# Patient Record
Sex: Female | Born: 1960 | Race: White | Hispanic: No | Smoking: Never smoker
Health system: Southern US, Community
[De-identification: ages and names within clinical notes are randomized; demographics above are authoritative.]

## PROBLEM LIST (undated history)

## (undated) DIAGNOSIS — M779 Enthesopathy, unspecified: Secondary | ICD-10-CM

## (undated) DIAGNOSIS — I341 Nonrheumatic mitral (valve) prolapse: Secondary | ICD-10-CM

## (undated) DIAGNOSIS — G35 Multiple sclerosis: Secondary | ICD-10-CM

## (undated) DIAGNOSIS — I499 Cardiac arrhythmia, unspecified: Secondary | ICD-10-CM

## (undated) DIAGNOSIS — R32 Unspecified urinary incontinence: Secondary | ICD-10-CM

## (undated) DIAGNOSIS — M1990 Unspecified osteoarthritis, unspecified site: Secondary | ICD-10-CM

## (undated) DIAGNOSIS — R51 Headache: Secondary | ICD-10-CM

## (undated) DIAGNOSIS — F419 Anxiety disorder, unspecified: Secondary | ICD-10-CM

## (undated) DIAGNOSIS — K589 Irritable bowel syndrome without diarrhea: Secondary | ICD-10-CM

## (undated) DIAGNOSIS — R5382 Chronic fatigue, unspecified: Secondary | ICD-10-CM

## (undated) DIAGNOSIS — F988 Other specified behavioral and emotional disorders with onset usually occurring in childhood and adolescence: Secondary | ICD-10-CM

## (undated) DIAGNOSIS — H539 Unspecified visual disturbance: Secondary | ICD-10-CM

## (undated) DIAGNOSIS — G259 Extrapyramidal and movement disorder, unspecified: Secondary | ICD-10-CM

## (undated) DIAGNOSIS — R29898 Other symptoms and signs involving the musculoskeletal system: Secondary | ICD-10-CM

## (undated) DIAGNOSIS — G629 Polyneuropathy, unspecified: Secondary | ICD-10-CM

## (undated) DIAGNOSIS — Z87820 Personal history of traumatic brain injury: Secondary | ICD-10-CM

## (undated) HISTORY — PX: TONSILLECTOMY: SUR1361

## (undated) HISTORY — PX: OTHER SURGICAL HISTORY: SHX169

## (undated) HISTORY — PX: LAPAROSCOPIC ASSISTED VAGINAL HYSTERECTOMY: SHX5398

## (undated) HISTORY — DX: Unspecified visual disturbance: H53.9

## (undated) HISTORY — DX: Unspecified osteoarthritis, unspecified site: M19.90

## (undated) HISTORY — DX: Enthesopathy, unspecified: M77.9

## (undated) HISTORY — PX: HERNIA REPAIR: SHX51

## (undated) HISTORY — DX: Extrapyramidal and movement disorder, unspecified: G25.9

---

## 1999-08-09 ENCOUNTER — Inpatient Hospital Stay (HOSPITAL_COMMUNITY): Admission: AD | Admit: 1999-08-09 | Discharge: 1999-08-09 | Payer: Self-pay | Admitting: Obstetrics and Gynecology

## 1999-09-29 ENCOUNTER — Other Ambulatory Visit: Admission: RE | Admit: 1999-09-29 | Discharge: 1999-09-29 | Payer: Self-pay | Admitting: Obstetrics and Gynecology

## 2000-06-22 ENCOUNTER — Encounter: Payer: Self-pay | Admitting: Internal Medicine

## 2000-06-22 ENCOUNTER — Encounter: Admission: RE | Admit: 2000-06-22 | Discharge: 2000-06-22 | Payer: Self-pay | Admitting: Internal Medicine

## 2000-10-02 ENCOUNTER — Other Ambulatory Visit: Admission: RE | Admit: 2000-10-02 | Discharge: 2000-10-02 | Payer: Self-pay | Admitting: Obstetrics and Gynecology

## 2001-10-09 ENCOUNTER — Other Ambulatory Visit: Admission: RE | Admit: 2001-10-09 | Discharge: 2001-10-09 | Payer: Self-pay | Admitting: Obstetrics and Gynecology

## 2001-10-12 ENCOUNTER — Encounter: Admission: RE | Admit: 2001-10-12 | Discharge: 2001-10-12 | Payer: Self-pay | Admitting: Internal Medicine

## 2001-10-12 ENCOUNTER — Encounter: Payer: Self-pay | Admitting: Internal Medicine

## 2002-01-15 ENCOUNTER — Encounter (INDEPENDENT_AMBULATORY_CARE_PROVIDER_SITE_OTHER): Payer: Self-pay | Admitting: Specialist

## 2002-01-16 ENCOUNTER — Inpatient Hospital Stay (HOSPITAL_COMMUNITY): Admission: RE | Admit: 2002-01-16 | Discharge: 2002-01-17 | Payer: Self-pay | Admitting: Obstetrics and Gynecology

## 2002-07-22 ENCOUNTER — Encounter: Admission: RE | Admit: 2002-07-22 | Discharge: 2002-07-22 | Payer: Self-pay | Admitting: Obstetrics and Gynecology

## 2002-07-22 ENCOUNTER — Encounter: Payer: Self-pay | Admitting: Obstetrics and Gynecology

## 2002-11-05 ENCOUNTER — Encounter: Admission: RE | Admit: 2002-11-05 | Discharge: 2002-11-05 | Payer: Self-pay | Admitting: Family Medicine

## 2003-07-16 ENCOUNTER — Other Ambulatory Visit: Admission: RE | Admit: 2003-07-16 | Discharge: 2003-07-16 | Payer: Self-pay | Admitting: Obstetrics and Gynecology

## 2003-09-19 ENCOUNTER — Encounter: Admission: RE | Admit: 2003-09-19 | Discharge: 2003-12-18 | Payer: Self-pay | Admitting: Internal Medicine

## 2003-09-23 ENCOUNTER — Encounter: Admission: RE | Admit: 2003-09-23 | Discharge: 2003-09-23 | Payer: Self-pay | Admitting: Internal Medicine

## 2003-09-23 ENCOUNTER — Encounter: Payer: Self-pay | Admitting: Internal Medicine

## 2003-11-14 ENCOUNTER — Encounter: Admission: RE | Admit: 2003-11-14 | Discharge: 2003-11-14 | Payer: Self-pay | Admitting: Internal Medicine

## 2004-07-26 ENCOUNTER — Other Ambulatory Visit: Admission: RE | Admit: 2004-07-26 | Discharge: 2004-07-26 | Payer: Self-pay | Admitting: Obstetrics and Gynecology

## 2005-07-07 ENCOUNTER — Ambulatory Visit (HOSPITAL_COMMUNITY): Admission: RE | Admit: 2005-07-07 | Discharge: 2005-07-07 | Payer: Self-pay | Admitting: General Surgery

## 2005-07-07 ENCOUNTER — Encounter (INDEPENDENT_AMBULATORY_CARE_PROVIDER_SITE_OTHER): Payer: Self-pay | Admitting: Specialist

## 2005-08-19 ENCOUNTER — Other Ambulatory Visit: Admission: RE | Admit: 2005-08-19 | Discharge: 2005-08-19 | Payer: Self-pay | Admitting: Obstetrics and Gynecology

## 2006-02-24 ENCOUNTER — Encounter: Admission: RE | Admit: 2006-02-24 | Discharge: 2006-02-24 | Payer: Self-pay | Admitting: Internal Medicine

## 2006-04-07 ENCOUNTER — Encounter: Admission: RE | Admit: 2006-04-07 | Discharge: 2006-04-07 | Payer: Self-pay | Admitting: Pediatrics

## 2006-12-27 ENCOUNTER — Encounter: Admission: RE | Admit: 2006-12-27 | Discharge: 2006-12-27 | Payer: Self-pay | Admitting: Specialist

## 2007-01-01 ENCOUNTER — Encounter: Admission: RE | Admit: 2007-01-01 | Discharge: 2007-01-01 | Payer: Self-pay | Admitting: Specialist

## 2007-03-08 ENCOUNTER — Encounter: Admission: RE | Admit: 2007-03-08 | Discharge: 2007-03-08 | Payer: Self-pay | Admitting: Gastroenterology

## 2007-04-17 ENCOUNTER — Emergency Department (HOSPITAL_COMMUNITY): Admission: EM | Admit: 2007-04-17 | Discharge: 2007-04-17 | Payer: Self-pay | Admitting: *Deleted

## 2007-08-19 ENCOUNTER — Ambulatory Visit (HOSPITAL_COMMUNITY): Admission: RE | Admit: 2007-08-19 | Discharge: 2007-08-19 | Payer: Self-pay | Admitting: Internal Medicine

## 2007-09-15 ENCOUNTER — Emergency Department (HOSPITAL_COMMUNITY): Admission: EM | Admit: 2007-09-15 | Discharge: 2007-09-15 | Payer: Self-pay | Admitting: Emergency Medicine

## 2008-04-18 ENCOUNTER — Encounter: Admission: RE | Admit: 2008-04-18 | Discharge: 2008-04-18 | Payer: Self-pay | Admitting: Gastroenterology

## 2008-10-04 ENCOUNTER — Inpatient Hospital Stay (HOSPITAL_COMMUNITY): Admission: EM | Admit: 2008-10-04 | Discharge: 2008-10-09 | Payer: Self-pay | Admitting: Emergency Medicine

## 2008-10-07 ENCOUNTER — Encounter (INDEPENDENT_AMBULATORY_CARE_PROVIDER_SITE_OTHER): Payer: Self-pay | Admitting: Internal Medicine

## 2009-05-19 ENCOUNTER — Encounter: Admission: RE | Admit: 2009-05-19 | Discharge: 2009-05-19 | Payer: Self-pay | Admitting: Gastroenterology

## 2010-03-10 ENCOUNTER — Inpatient Hospital Stay (HOSPITAL_COMMUNITY): Admission: EM | Admit: 2010-03-10 | Discharge: 2010-03-18 | Payer: Self-pay | Admitting: Emergency Medicine

## 2010-06-24 ENCOUNTER — Ambulatory Visit (HOSPITAL_COMMUNITY): Admission: RE | Admit: 2010-06-24 | Discharge: 2010-06-24 | Payer: Self-pay | Admitting: Obstetrics and Gynecology

## 2010-09-07 ENCOUNTER — Ambulatory Visit: Payer: Self-pay | Admitting: Internal Medicine

## 2010-09-14 ENCOUNTER — Ambulatory Visit: Payer: Self-pay | Admitting: Internal Medicine

## 2010-10-07 ENCOUNTER — Ambulatory Visit: Payer: Self-pay | Admitting: Internal Medicine

## 2010-10-07 ENCOUNTER — Encounter: Admission: RE | Admit: 2010-10-07 | Discharge: 2010-10-07 | Payer: Self-pay | Admitting: Internal Medicine

## 2011-02-10 ENCOUNTER — Ambulatory Visit (INDEPENDENT_AMBULATORY_CARE_PROVIDER_SITE_OTHER): Payer: 59 | Admitting: Internal Medicine

## 2011-02-10 DIAGNOSIS — G35 Multiple sclerosis: Secondary | ICD-10-CM

## 2011-02-10 DIAGNOSIS — R5383 Other fatigue: Secondary | ICD-10-CM

## 2011-02-10 DIAGNOSIS — R5381 Other malaise: Secondary | ICD-10-CM

## 2011-02-23 LAB — BASIC METABOLIC PANEL
BUN: 12 mg/dL (ref 6–23)
BUN: 8 mg/dL (ref 6–23)
CO2: 29 mEq/L (ref 19–32)
CO2: 33 mEq/L — ABNORMAL HIGH (ref 19–32)
Calcium: 8.1 mg/dL — ABNORMAL LOW (ref 8.4–10.5)
Calcium: 8.8 mg/dL (ref 8.4–10.5)
Chloride: 100 mEq/L (ref 96–112)
Chloride: 101 mEq/L (ref 96–112)
Creatinine, Ser: 0.78 mg/dL (ref 0.4–1.2)
Creatinine, Ser: 0.9 mg/dL (ref 0.4–1.2)
GFR calc Af Amer: >60 mL/min (ref >60)
GFR calc Af Amer: >60 mL/min (ref >60)
GFR calc non Af Amer: >60 mL/min (ref >60)
GFR calc non Af Amer: >60 mL/min (ref >60)
Glucose, Bld: 102 mg/dL — ABNORMAL HIGH (ref 70–99)
Glucose, Bld: 112 mg/dL — ABNORMAL HIGH (ref 70–99)
Potassium: 3.5 mEq/L (ref 3.5–5.1)
Potassium: 3.9 mEq/L (ref 3.5–5.1)
Sodium: 139 mEq/L (ref 135–145)
Sodium: 140 mEq/L (ref 135–145)

## 2011-02-23 LAB — CARDIAC PANEL(CRET KIN+CKTOT+MB+TROPI)
CK, MB: 1.8 ng/mL (ref 0.3–4.0)
CK, MB: 2.3 ng/mL (ref 0.3–4.0)
CK, MB: 2.7 ng/mL (ref 0.3–4.0)
CK, MB: 3.1 ng/mL (ref 0.3–4.0)
Relative Index: 3.1 — ABNORMAL HIGH (ref 0.0–2.5)
Relative Index: INVALID (ref 0.0–2.5)
Relative Index: INVALID (ref 0.0–2.5)
Relative Index: INVALID (ref 0.0–2.5)
Total CK: 100 U/L (ref 7–177)
Total CK: 74 U/L (ref 7–177)
Total CK: 88 U/L (ref 7–177)
Total CK: 97 U/L (ref 7–177)
Troponin I: 0.01 ng/mL (ref 0.00–0.06)
Troponin I: 0.01 ng/mL (ref 0.00–0.06)
Troponin I: 0.03 ng/mL (ref 0.00–0.06)
Troponin I: <0.01 ng/mL (ref 0.00–0.06)

## 2011-02-23 LAB — URINALYSIS, ROUTINE W REFLEX MICROSCOPIC
Bilirubin Urine: NEGATIVE
Glucose, UA: NEGATIVE mg/dL
Hgb urine dipstick: NEGATIVE
Ketones, ur: NEGATIVE mg/dL
Nitrite: NEGATIVE
Protein, ur: NEGATIVE mg/dL
Specific Gravity, Urine: 1.008 (ref 1.005–1.030)
Urobilinogen, UA: 0.2 mg/dL (ref 0.0–1.0)
pH: 6 (ref 5.0–8.0)

## 2011-02-23 LAB — CBC
HCT: 33.3 % — ABNORMAL LOW (ref 36.0–46.0)
HCT: 34.3 % — ABNORMAL LOW (ref 36.0–46.0)
HCT: 35.1 % — ABNORMAL LOW (ref 36.0–46.0)
HCT: 35.6 % — ABNORMAL LOW (ref 36.0–46.0)
HCT: 39.9 % (ref 36.0–46.0)
HCT: 41.2 % (ref 36.0–46.0)
HCT: 41.2 % (ref 36.0–46.0)
Hemoglobin: 11.5 g/dL — ABNORMAL LOW (ref 12.0–15.0)
Hemoglobin: 11.6 g/dL — ABNORMAL LOW (ref 12.0–15.0)
Hemoglobin: 11.9 g/dL — ABNORMAL LOW (ref 12.0–15.0)
Hemoglobin: 12.1 g/dL (ref 12.0–15.0)
Hemoglobin: 13.5 g/dL (ref 12.0–15.0)
Hemoglobin: 14 g/dL (ref 12.0–15.0)
Hemoglobin: 14.3 g/dL (ref 12.0–15.0)
MCHC: 33.7 g/dL (ref 30.0–36.0)
MCHC: 33.8 g/dL (ref 30.0–36.0)
MCHC: 33.9 g/dL (ref 30.0–36.0)
MCHC: 34 g/dL (ref 30.0–36.0)
MCHC: 34.1 g/dL (ref 30.0–36.0)
MCHC: 34.4 g/dL (ref 30.0–36.0)
MCHC: 34.8 g/dL (ref 30.0–36.0)
MCV: 89.2 fL (ref 78.0–100.0)
MCV: 89.2 fL (ref 78.0–100.0)
MCV: 90.4 fL (ref 78.0–100.0)
MCV: 90.6 fL (ref 78.0–100.0)
MCV: 90.6 fL (ref 78.0–100.0)
MCV: 90.8 fL (ref 78.0–100.0)
MCV: 90.9 fL (ref 78.0–100.0)
Platelets: 211 10*3/uL (ref 150–400)
Platelets: 230 10*3/uL (ref 150–400)
Platelets: 268 10*3/uL (ref 150–400)
Platelets: 268 10*3/uL (ref 150–400)
Platelets: 271 10*3/uL (ref 150–400)
Platelets: 302 10*3/uL (ref 150–400)
Platelets: 341 10*3/uL (ref 150–400)
RBC: 3.68 MIL/uL — ABNORMAL LOW (ref 3.87–5.11)
RBC: 3.77 MIL/uL — ABNORMAL LOW (ref 3.87–5.11)
RBC: 3.88 MIL/uL (ref 3.87–5.11)
RBC: 3.93 MIL/uL (ref 3.87–5.11)
RBC: 4.4 MIL/uL (ref 3.87–5.11)
RBC: 4.61 MIL/uL (ref 3.87–5.11)
RBC: 4.62 MIL/uL (ref 3.87–5.11)
RDW: 13.4 % (ref 11.5–15.5)
RDW: 14 % (ref 11.5–15.5)
RDW: 14 % (ref 11.5–15.5)
RDW: 14.1 % (ref 11.5–15.5)
RDW: 14.1 % (ref 11.5–15.5)
RDW: 14.5 % (ref 11.5–15.5)
RDW: 14.5 % (ref 11.5–15.5)
WBC: 10 10*3/uL (ref 4.0–10.5)
WBC: 10.2 10*3/uL (ref 4.0–10.5)
WBC: 19 10*3/uL — ABNORMAL HIGH (ref 4.0–10.5)
WBC: 22.9 10*3/uL — ABNORMAL HIGH (ref 4.0–10.5)
WBC: 24.2 10*3/uL — ABNORMAL HIGH (ref 4.0–10.5)
WBC: 26.3 10*3/uL — ABNORMAL HIGH (ref 4.0–10.5)
WBC: 9.5 10*3/uL (ref 4.0–10.5)

## 2011-02-23 LAB — COMPREHENSIVE METABOLIC PANEL
ALT: 26 U/L (ref 0–35)
ALT: 33 U/L (ref 0–35)
ALT: 41 U/L — ABNORMAL HIGH (ref 0–35)
ALT: 50 U/L — ABNORMAL HIGH (ref 0–35)
AST: 27 U/L (ref 0–37)
AST: 30 U/L (ref 0–37)
AST: 31 U/L (ref 0–37)
AST: 33 U/L (ref 0–37)
Albumin: 2.9 g/dL — ABNORMAL LOW (ref 3.5–5.2)
Albumin: 2.9 g/dL — ABNORMAL LOW (ref 3.5–5.2)
Albumin: 2.9 g/dL — ABNORMAL LOW (ref 3.5–5.2)
Albumin: 3.1 g/dL — ABNORMAL LOW (ref 3.5–5.2)
Alkaline Phosphatase: 64 U/L (ref 39–117)
Alkaline Phosphatase: 67 U/L (ref 39–117)
Alkaline Phosphatase: 72 U/L (ref 39–117)
Alkaline Phosphatase: 73 U/L (ref 39–117)
BUN: 12 mg/dL (ref 6–23)
BUN: 13 mg/dL (ref 6–23)
BUN: 15 mg/dL (ref 6–23)
BUN: 8 mg/dL (ref 6–23)
CO2: 28 mEq/L (ref 19–32)
CO2: 28 mEq/L (ref 19–32)
CO2: 29 mEq/L (ref 19–32)
CO2: 32 mEq/L (ref 19–32)
Calcium: 7.9 mg/dL — ABNORMAL LOW (ref 8.4–10.5)
Calcium: 7.9 mg/dL — ABNORMAL LOW (ref 8.4–10.5)
Calcium: 8 mg/dL — ABNORMAL LOW (ref 8.4–10.5)
Calcium: 8.2 mg/dL — ABNORMAL LOW (ref 8.4–10.5)
Chloride: 100 mEq/L (ref 96–112)
Chloride: 102 mEq/L (ref 96–112)
Chloride: 104 mEq/L (ref 96–112)
Chloride: 106 mEq/L (ref 96–112)
Creatinine, Ser: 0.79 mg/dL (ref 0.4–1.2)
Creatinine, Ser: 0.84 mg/dL (ref 0.4–1.2)
Creatinine, Ser: 0.88 mg/dL (ref 0.4–1.2)
Creatinine, Ser: 0.94 mg/dL (ref 0.4–1.2)
GFR calc Af Amer: >60 mL/min (ref >60)
GFR calc Af Amer: >60 mL/min (ref >60)
GFR calc Af Amer: >60 mL/min (ref >60)
GFR calc Af Amer: >60 mL/min (ref >60)
GFR calc non Af Amer: >60 mL/min (ref >60)
GFR calc non Af Amer: >60 mL/min (ref >60)
GFR calc non Af Amer: >60 mL/min (ref >60)
GFR calc non Af Amer: >60 mL/min (ref >60)
Glucose, Bld: 102 mg/dL — ABNORMAL HIGH (ref 70–99)
Glucose, Bld: 139 mg/dL — ABNORMAL HIGH (ref 70–99)
Glucose, Bld: 199 mg/dL — ABNORMAL HIGH (ref 70–99)
Glucose, Bld: 211 mg/dL — ABNORMAL HIGH (ref 70–99)
Potassium: 3.2 mEq/L — ABNORMAL LOW (ref 3.5–5.1)
Potassium: 3.4 mEq/L — ABNORMAL LOW (ref 3.5–5.1)
Potassium: 3.6 mEq/L (ref 3.5–5.1)
Potassium: 3.9 mEq/L (ref 3.5–5.1)
Sodium: 138 mEq/L (ref 135–145)
Sodium: 138 mEq/L (ref 135–145)
Sodium: 138 mEq/L (ref 135–145)
Sodium: 141 mEq/L (ref 135–145)
Total Bilirubin: 0.3 mg/dL (ref 0.3–1.2)
Total Bilirubin: 0.4 mg/dL (ref 0.3–1.2)
Total Bilirubin: 0.6 mg/dL (ref 0.3–1.2)
Total Bilirubin: 0.7 mg/dL (ref 0.3–1.2)
Total Protein: 5.9 g/dL — ABNORMAL LOW (ref 6.0–8.3)
Total Protein: 6 g/dL (ref 6.0–8.3)
Total Protein: 6 g/dL (ref 6.0–8.3)
Total Protein: 6.4 g/dL (ref 6.0–8.3)

## 2011-02-23 LAB — DIFFERENTIAL
Basophils Absolute: 0 10*3/uL (ref 0.0–0.1)
Basophils Absolute: 0.1 10*3/uL (ref 0.0–0.1)
Basophils Absolute: 0.1 10*3/uL (ref 0.0–0.1)
Basophils Relative: 0 % (ref 0–1)
Basophils Relative: 0 % (ref 0–1)
Basophils Relative: 1 % (ref 0–1)
Eosinophils Absolute: 0 10*3/uL (ref 0.0–0.7)
Eosinophils Absolute: 0 10*3/uL (ref 0.0–0.7)
Eosinophils Absolute: 0.1 10*3/uL (ref 0.0–0.7)
Eosinophils Relative: 0 % (ref 0–5)
Eosinophils Relative: 0 % (ref 0–5)
Eosinophils Relative: 1 % (ref 0–5)
Lymphocytes Relative: 10 % — ABNORMAL LOW (ref 12–46)
Lymphocytes Relative: 18 % (ref 12–46)
Lymphocytes Relative: 22 % (ref 12–46)
Lymphs Abs: 1.8 10*3/uL (ref 0.7–4.0)
Lymphs Abs: 2.1 10*3/uL (ref 0.7–4.0)
Lymphs Abs: 2.5 10*3/uL (ref 0.7–4.0)
Monocytes Absolute: 0.2 10*3/uL (ref 0.1–1.0)
Monocytes Absolute: 0.8 10*3/uL (ref 0.1–1.0)
Monocytes Absolute: 1 10*3/uL (ref 0.1–1.0)
Monocytes Relative: 11 % (ref 3–12)
Monocytes Relative: 2 % — ABNORMAL LOW (ref 3–12)
Monocytes Relative: 3 % (ref 3–12)
Neutro Abs: 22.9 10*3/uL — ABNORMAL HIGH (ref 1.7–7.7)
Neutro Abs: 6.2 10*3/uL (ref 1.7–7.7)
Neutro Abs: 7.9 10*3/uL — ABNORMAL HIGH (ref 1.7–7.7)
Neutrophils Relative %: 65 % (ref 43–77)
Neutrophils Relative %: 80 % — ABNORMAL HIGH (ref 43–77)
Neutrophils Relative %: 87 % — ABNORMAL HIGH (ref 43–77)

## 2011-02-23 LAB — MAGNESIUM
Magnesium: 1.9 mg/dL (ref 1.5–2.5)
Magnesium: 1.9 mg/dL (ref 1.5–2.5)
Magnesium: 2.3 mg/dL (ref 1.5–2.5)

## 2011-02-23 LAB — CULTURE, BLOOD (ROUTINE X 2)
Culture: NO GROWTH
Culture: NO GROWTH

## 2011-02-23 LAB — BRAIN NATRIURETIC PEPTIDE: Pro B Natriuretic peptide (BNP): 31.8 pg/mL (ref 0.0–100.0)

## 2011-02-23 LAB — SEDIMENTATION RATE: Sed Rate: 13 mm/hr (ref 0–22)

## 2011-02-23 LAB — HEMOGLOBIN A1C
Hgb A1c MFr Bld: 5.5 % (ref 4.6–6.1)
Mean Plasma Glucose: 111 mg/dL

## 2011-02-23 LAB — VANCOMYCIN, TROUGH: Vancomycin Tr: 13.5 ug/mL (ref 10.0–20.0)

## 2011-02-23 LAB — PHOSPHORUS: Phosphorus: 3.8 mg/dL (ref 2.3–4.6)

## 2011-04-19 NOTE — Consult Note (Signed)
NAMEVIRGILENE, STRYKER               ACCOUNT NO.:  [id number]   MEDICAL RECORD NO.:  [id number]          PATIENT TYPE:  INP   LOCATION:  1431                         FACILITY:  [facility]   PHYSICIAN:  Michael L. Reynolds, M.D.DATE OF BIRTH:  1960-12-31   DATE OF CONSULTATION:  10/06/2008  DATE OF DISCHARGE:                                 CONSULTATION   REQUESTING PHYSICIAN:  Dr. Isidor Holts.   REASON FOR CONSULTATION:  Chest pain, a history of MS.   HISTORY OF PRESENT ILLNESS:  This is an inpatient consultation  evaluation of this existing [facility] patient, a 50-  year-old woman who has seen Dr. Anne Hahn in the office in the past, but is  presently getting her neurologic care elsewhere.  She has a history of  multiple sclerosis, for which she is presently receiving natalizumab  infusions through the office of Dr. Epimenio Foot at [facility], and for which  she sees Dr. Tinnie Gens at [facility].  She presented to the emergency  department with chest pain on October 04, 2008.  She had also had some  cough with productive sputum.  She was febrile at 100.  She was admitted  and started Avelox and Tamiflu.  She was evaluated by cardiology, but  had negative cardiac enzymes.  Is not thought to have any acute cardiac  issue.  For reasons which are not all that clear, neurologic  consultation is requested.  The patient says that she has some diffuse  back pain and joint pain, which she often has with her MS flares and  even if she has fever apart from her flares.  However, the chest pain is  a new symptom, the only really new symptom which she has.  She denies  any definite changes in her visual acuity, ability to speak or swallow,  or strength or sensation in the extremities.  She does have decreased  appetite with some associated nausea.  She says that her back feels as  if it is sunburned and is tender to touch.   PAST MEDICAL HISTORY:  Remarkable for the multiple sclerosis as  above.  She also has a history of depression, for which she is on medication.   MEDICATIONS PRIOR TO ADMISSION:  She was taking Nuvigil daily, Pristiq  50 mg daily, multivitamin, and monthly natalizumab infusions as noted  above.   HOSPITAL MEDICATIONS:  Include Xanax q.h.s., aspirin, Lovenox,  intravenous Avelox, Tamiflu which was discontinued today, Protonix,  Effexor and several p.r.n. medications.  She also continues on the  [place] and the Pristiq.   FAMILY/SOCIAL HISTORY/REVIEW OF SYSTEMS:  Per admission H and P of  October 04, 2008, which is reviewed.   EXAMINATION:  VITAL SIGNS:  Temperature 99.7, up to 101.3, blood  pressure 109/61, pulse 89, respirations 16.  GENERAL EXAMINATION:  This is an uncomfortable-appearing woman supine in  the hospital bed in no distress.  HEAD:  Cranium normocephalic and atraumatic.  Oropharynx benign.  NECK:  Supple without carotid or supraclavicular bruits.  HEART:  Regular rate and rhythm without murmurs.  NEUROLOGIC EXAMINATION OF MENTAL STATUS:  She is awake and alert.  She  is oriented to time, place and person.  Speech is fluent and not  dysarthric.  She has no defects to confrontational naming and can repeat  a phrase.  Mood is euthymic and affect appropriate.  CRANIAL NERVES:  Pupils are equal and reactive.  Extraocular movements  full without nystagmus.  Visual fields full to confrontation.  Hearing  is intact to conversational speech.  Face, tongue and palate move  normally and symmetrically.  MOTOR:  Normal bulk and tone.  She has normal strength in the upper  extremities.  She has minimal weakness in a pyramidal distribution in  the lower extremities.  Sensation is intact to light touch in all  extremities.  COORDINATION:  Finger-to-nose performed accurately.  Gait is slow but  adequate.  Reflexes are generally brisk.  Toes are upgoing bilaterally.   LABORATORY DATA:  White count 13.4 today, down from 23 yesterday.  Hemoglobin  11.0, platelets 64,000.  Chemistries were unremarkable.  Urinalysis negative.  CT of the chest done earlier today reveals a right  middle lobe consolidation with no evidence of PE.  A 2D echo is pending.   IMPRESSION:  1. Febrile illness with pseudo exacerbation of multiple sclerosis.  2. Relapsing-remitting multiple sclerosis.  3. Pneumonia with resultant chest pain.   RECOMMENDATIONS:  Would treat with antibiotics for pneumonia.  She  requires no change of her MS treatment at present.  At discharge, she  can follow up with her primary neurologist.   Thank you for the consultation.      Michael L. Thad Ranger, M.D.  Electronically Signed     MLR/MEDQ  D:  10/06/2008  T:  10/07/2008  Job:  [id number]

## 2011-04-19 NOTE — Discharge Summary (Signed)
[name], [name]               ACCOUNT NO.:  [id number]   MEDICAL RECORD NO.:  [id number]          PATIENT TYPE:  INP   LOCATION:  1431                         FACILITY:  [facility]   PHYSICIAN:  Hillery Aldo, M.D.   DATE OF BIRTH:  1961-09-22   DATE OF ADMISSION:  10/04/2008  DATE OF DISCHARGE:  10/09/2008                               DISCHARGE SUMMARY   PRIMARY CARE PHYSICIAN:  Dr. Eden Emms [name].   DISCHARGE DIAGNOSES:  1. Right middle and lower lobe pneumonia.  2. History of multiple sclerosis.  3. Atypical chest pain.  4. Dehydration.  5. Hypokalemia.  6. Depression.   DISCHARGE MEDICATIONS:  1. Nuvigil 250 mg p.o. daily.  2. Prestiq 50 mg p.o. daily.  3. Women's Daily Formula multivitamin daily.  4. Tysabri infusion once a month by her primary neurologist.  5. Hydrocodone/acetaminophen 10/650 daily p.r.n.  6. Avelox 400 mg daily times 5 days.  7. Zofran 4 mg q.6 h p.r.n. nausea.   CONSULTATIONS:  1. Dr. Lynnea Ferrier of Cardiology.  2. Dr. Thad Ranger of Neurology.   BRIEF ADMISSION HISTORY OF PRESENT ILLNESS:  The patient is a 50-year-  old female with past medical history of multiple sclerosis who presented  to the hospital with a chief complaint of retrosternal stabbing chest  pain radiating to the back.  She also had some mild dyspnea and nausea  as well as generalized chills.  Upon initial evaluation, she noted  having a recent upper respiratory infection with productive sputum on  the date of admission.  She also had some myalgias and generalized  weakness and therefore was admitted for further evaluation and workup.  For the full details, please see the dictated report done by Dr.  Toniann Fail.   PROCEDURES AND DIAGNOSTIC STUDIES:  1. Chest X-Ray: On October 04, 2008 showed borderline cardiomegaly.  2. CT angiogram of the chest on October 06, 2008 showed right middle      lobe and right lower lobe air space disease and consolidation      consistent with  pneumonia.  There was associated atelectasis and a      small right pleural effusion.  There was a tiny left pleural      effusion and left basilar atelectasis.  No evidence of pulmonary      emboli or thoracic aortic aneurysm or dissection.  3. A two-dimensional echocardiogram on October 07, 2008 was      essentially a normal study.  Left ventricular systolic function was      normal with an ejection fraction estimated at 55-65%.  There was no      diagnostic evidence of left ventricular regional wall motion      abnormality.  Left ventricular diastolic function parameters were      normal.  The estimated peak right ventricular systolic pressure was      mild to moderately increased.  There is mild to moderate tricuspid      valvular regurgitation.   DISCHARGE LABORATORY VALUES:  Sodium was 141, potassium 3.9, chloride  109, bicarb 28, BUN 5, creatinine 0.79, glucose 94.  White  blood cell  count was 5, hemoglobin 11.2,, hematocrit 32.5, platelets 235.   HOSPITAL COURSE BY PROBLEM:  1. Right middle lobe/right lower lobe pneumonia:  The patient was      admitted and diagnostic evaluation was undertaken.  Because of her      flu-like illness presentation, she was empirically started on      Tamiflu.  When a CT angiogram subsequently showed evidence of      pneumonia, the patient was started on IV Avelox.  She was gradually      transitioned over to p.o. Avelox and at this point is feeling much      better.  She will complete a 10-day course of therapy with Avelox      and has completed 5 days of treatment with Tamiflu.  2. Multiple sclerosis:  Because of the patient's pain, Neurology      consultation was requested and kindly provided by Dr. Thad Ranger.      Dr. Thad Ranger felt that the patient's pain was likely due to a      pseudo exacerbation of multiple sclerosis.  He did not recommend      any change in her usual MS treatment regimen and recommended that      she follow up with her  primary neurologist after discharge.  3. Atypical chest pain:  The patient had cardiac markers cycled q.8 h      x3 sets.  Troponins were negative.  A two-dimensional      echocardiogram was normal.  Pulmonary embolism was ruled out by CT      angiogram.  Nevertheless, Cardiology was consulted and they did not      feel the patient had any evidence of acute coronary syndrome or a      cardiac cause of her chest pain.  Her pain has improved with      treatment of her underlying pneumonia and it is likely that her      chest pain was due to underlying pleurisy.  4. Dehydration:  The patient was appropriately hydrated.  On initial      evaluation, she did have trace ketones in the urine though there      was no specific evidence of elevated BUN to creatinine ratio.      Nevertheless, with hydration, her creatinine went from 0.9-0.79,      and her BUN went from 10 to 5.  5. Hypokalemia:  The patient's potassium was appropriately repleted      and her discharge potassium is appropriate at 3.9.  6. Depression:  The patient was continued on antidepressant therapy.   DISPOSITION:  The patient feels well and will be discharged home today.  She is instructed to follow up with Dr. Lenord Fellers in 1-2 weeks and with her  neurologist as regularly scheduled.   Time spent co-ordinating care for discharge equal to 30 minutes.      Hillery Aldo, M.D.  Electronically Signed     CR/MEDQ  D:  10/09/2008  T:  10/09/2008  Job:  [id number]   cc:   Luanna Cole. Lenord Fellers, M.D.  Fax: [phone]

## 2011-04-19 NOTE — H&P (Signed)
[name], [name]               ACCOUNT NO.:  [id number]   MEDICAL RECORD NO.:  [id number]          PATIENT TYPE:  EMS   LOCATION:  ED                           FACILITY:  [facility]   PHYSICIAN:  Eduard Clos, MDDATE OF BIRTH:  [birth year]   DATE OF ADMISSION:  10/04/2008  DATE OF DISCHARGE:                              HISTORY & PHYSICAL   PRIMARY CARE PHYSICIAN:  Luanna Cole. [name], MD   CHIEF COMPLAINT:  Chest pain.   HISTORY OF PRESENT ILLNESS:  This 50 year old female with history of  multiple sclerosis, history of depression, chronic pain presented with  complaints of chest pain.  The patient had been experiencing chest pain  during the morning when she was driving her car and bringing her son  back home.  It is retrosternal, stabbing in nature, radiating to the  back.  It has no relation to exertion; she had some mild associated  shortness of breath and nausea and felt cold.  The patient also  experienced some cough with productive sputum today.  The patient also  has been experiencing some generalized weakness over the last 2-3 days.  The patient denies any abdominal pain, dysuria, discharge, diarrhea,  loss of consciousness or weakness fatigue.   PAST MEDICAL HISTORY:  1. Multiple sclerosis.  2. Depression.   PAST SURGICAL HISTORY:  Three times she had a C-section.  She had tubal  ligation and hysterectomy.   MEDICATION PRIOR TO ADMISSION:  1. Nuvigil 250 mg p.o. daily.  2. Pristiq 50 mg p.o. daily.  3. Women's Daily Formula p.o. daily.  4. Tysabri infusion once a month.  Last infusion on September 25, 2008.  5. Hydrocodone acetaminophen 10/650 mg p.o. daily p.r.n.   ALLERGIES:  IBUPROFEN which made her have swelling.   FAMILY HISTORY:  Significant for her mother having stroke at age 61.   SOCIAL HISTORY:  The patient lives with her husband.  Denies smoking  cigarettes, drinking alcohol or using illegal drugs.   REVIEW OF SYSTEMS:  As per history of present  illness.  Nothing else  significant.   PHYSICAL EXAMINATION:  GENERAL:  The patient was examined at bedside;  not in acute distress.  VITAL SIGNS:  Blood pressure is 120/60, pulse 86 per minute, temperature  100, respiration 18, O2 saturation 95%.  HEENT:  Anicteric.  No pallor.  CHEST:  Bilateral air entry present, no rhonchi on auscultation.  HEART:  S1, S2 heard.  ABDOMEN:  Soft, nontender.  Bowel sounds heard.  No guarding or  rigidity.  CNS:  Alert and oriented to time, place, and person.  Motor upper and  lower extremities, 5/5.  EXTREMITIES:  Peripheral pulses felt.  No edema.   LABORATORIES/DIAGNOSTIC STUDIES:  Chest x-ray:  Borderline cardiomegaly.  EKG:  Normal sinus rhythm with nonspecific T-wave changes.  CBC:  WBC  19.3, hemoglobin 14.1, hematocrit 41.4, platelets 246, neutrophils 87%.  CK-MB 1.1, less than 1, troponin-I less than 0.05, less than 0.05.  UA  is showing trace ketones, negative for glucose, nitrites, leukocytes.   ASSESSMENT:  1. Chest pain, to rule out acute  coronary syndrome.  2. Fever with flu-like symptoms.  3. Leukocytosis.  4. Multiple sclerosis.  5. History of depression with no suicidal ideation presently.  6. Dehydration.  7. History of mitral valve prolapse.   PLAN:  The patient will be placed on telemetry.  Will cycle cardiac  markers.  Get a 2-D echo.  We will get blood cultures, urine cultures,  sputum cultures as the patient has leukocytosis with mild fever.  Will  place the patient on droplet  precaution.  Add Avelox and Tamiflu.  Further recommendation as the patient's condition evolves.      Eduard Clos, MD  Electronically Signed     ANK/MEDQ  D:  10/04/2008  T:  10/04/2008  Job:  [id number]   cc:   Luanna Cole. Lenord Fellers, M.D.  Fax: [phone]

## 2011-04-22 NOTE — Op Note (Signed)
[facility]  Patient:    [name], [name] Visit Number: [id number] MRN: [id number]          Service Type: DSU Location: [id number] [phone] Attending Physician:  Rhina Brackett Dictated by:   Duke Salvia. Marcelle Overlie, M.D. Proc. Date: 01/15/02 Admit Date:  01/15/2002                             Operative Report  PREOPERATIVE DIAGNOSES:       Abnormal uterine bleeding, pelvic pain, leiomyoma, umbilical hernia.  POSTOPERATIVE DIAGNOSES:      Abnormal uterine bleeding, pelvic pain, leiomyoma, umbilical hernia.  PROCEDURE:                    Laparoscopically assisted vaginal hysterectomy.  SURGEON:                      Duke Salvia. Marcelle Overlie, M.D.  ASSISTANT:                    Trevor Iha, M.D.  ANESTHESIA:                   General endotracheal.  COMPLICATIONS:                None.  DRAINS:                       Foley catheter.  ESTIMATED BLOOD LOSS:         200.  PROCEDURE AND FINDINGS:       Patient was taken to the operating room.  After an adequate level of general endotracheal anesthesia was obtained with the patients legs in stirrups, the abdomen, perineum, and vagina were prepped and draped in the usual manner for laparoscopy.  The bladder was drained.  EUA was carried out.  Uterus was 8 weeks size, mid position.  Hulka tenaculum was positioned.  Attention directed to the abdomen where the subumbilical area was infiltrated with 0.5% Marcaine plain.  A small incision was made.  The Verres needle was introduced without difficulty.  Intra-abdominal position was verified by pressure and water testing.  A pneumoperitoneum was then created. Laparoscopic trocar and sleeve were introduced without difficulty.  There was no evidence of any bleeding or trauma.  Three fingerbreadths above the symphysis in the midline a 5 mm trocar was inserted under direct visualization without complications.  Pelvic findings revealed the uterus was 8-9  weeks size, irregular with fibroids.  Adnexa were unremarkable.  The cul-de-sac was free and clear.  Upper abdomen was normal.  There was some bladder flap scarring from her prior cesarean sections.  The uterus anteflexed and the patient was in Trendelenburg.  A third 5 mm trocar was inserted in the midline half way between the symphysis and umbilicus and the tripolar coag/cutting instrument was positioned to coagulate and dissect free the utero-ovarian and round ligaments on either side.  After this was freed up with good hemostasis attention was directed to the vaginal portion of the procedure.  The patients legs were extended.  Weighted speculum was positioned.  Cervix grasped with the tenaculum.  The cervical vaginal mucosa was incised with a scalpel. Posterior colpotomy performed without difficulty.  The left uterosacral ligament was clamped, divided, and suture ligated in a Heaney fashion with 0 Dexon and held; the same repeated on the opposite side.  The bladder  was advanced with sharp and blunt dissection until the peritoneum could be identified.  Once this was identified and entered, the retractor was used to gently elevate the bladder out of the field.  In a sequential manner the cardinal ligament, uterine vasculature pedicles, and upper broad ligament pedicles were clamped, divided, and suture ligated with 0 Dexon.  Several portions of the uterus were then morcellated in the midline to allow for easier delivery of the body of the uterus.  Once this was accomplished the remaining utero-ovarian pedicle was clamped, divided, and suture ligated with 0 Dexon.  The cuff was then closed with a running locked 2-0 Dexon suture. All major pedicles were inspected and noted to be hemostatic.  Prior to closure sponge, needle, instrument counts were reported as correct x2.  The cuff was closed with figure-of-eight 2-0 Monocryl suture from right to left. This was hemostatic.  Catheter was  positioned draining clear urine.  The patient was placed in laparoscopy position again.  [place]otte irrigator was used to irrigate the pelvis.  Careful inspection revealed hemostasis.  Instruments were removed, gas allowed to escape.  Defects closed with 4-0 Dexon subcuticular sutures and Dermabond.  The procedure was turned over to Dr. Earlene Plater at that point to complete the umbilical hernia repair. Dictated by:   Duke Salvia. Marcelle Overlie, M.D. Attending Physician:  Rhina Brackett DD:  01/15/02 TD:  01/15/02 Job: [id number]

## 2011-04-22 NOTE — H&P (Signed)
[facility]  Patient:    [name], [name] Visit Number: [id number] MRN: [id number]          Service Type: Attending:  Duke Salvia. Marcelle Overlie, M.D. Dictated by:   Duke Salvia. Marcelle Overlie, M.D.                           History and Physical  CHIEF COMPLAINT:              Dysmenorrhea, leiomyoma.  HISTORY OF PRESENT ILLNESS:   A 50 year old G3 P3, prior tubal, who has had a six month to one year history of pelvic pain and worsening dysmenorrhea with leiomyoma, presents now for LAVH.  Additionally, an umbilical hernia was noted and Dr. Earlene Plater will be performing umbilical hernia repair at the same time. This procedure including risks of bleeding, infection, transfusion, adjacent organ injury, the possible need for open or additional surgery all discussed with her.  Ultrasound done in our office on November 12, 2001 showed uterine size 7 x 9.3 cm with two intramural fibroids 3.8 cm and several that were smaller.  PAST MEDICAL HISTORY:  ALLERGIES:                    None.  OPERATIONS:                   Cesarean section x 3, previous tubal ligation, repair of a deviated septum.  CURRENT MEDICATIONS:          Wellbutrin and dicyclomine for IBS.  REVIEW OF SYSTEMS:            She has also had lymphedema that Dr. Lenord Fellers has had her on diuretics for.  PHYSICAL EXAMINATION:  VITAL SIGNS:                  Temperature 98.2, blood pressure 120/78.  HEENT:                        Unremarkable.  NECK:                         Supple without masses.  LUNGS:                        Clear.  CARDIOVASCULAR:               Regular rate and rhythm without murmurs, rubs, or gallops noted.  BREASTS:                      Without masses.  ABDOMEN:                      Soft, flat, nontender.  Supraumbilical small hernia was noted.  PELVIC:                       Vagina and cervix normal.  Uterus mid position, upper limit of normal size.  Adnexa negative.  IMPRESSION:                    Symptomatic leiomyoma, pelvic pain with dysmenorrhea.  PLAN:                         LAVH.  Procedure and risks reviewed as above. Dictated by:   Duke Salvia. Marcelle Overlie, M.D. Attending:  Richard M. Marcelle Overlie, M.D. DD:  01/11/02 TD:  01/11/02 Job: [id number]

## 2011-04-22 NOTE — Op Note (Signed)
[facility]  Patient:    [name], [name] Visit Number: [id number] MRN: [id number]          Service Type: DSU Location: 910A [phone] Attending Physician:  Rhina Brackett Dictated by:   Sheppard Plumber Earlene Plater, M.D. Proc. Date: 01/15/02 Admit Date:  01/15/2002   CC:         Duke Salvia. Marcelle Overlie, M.D.   Operative Report  PREOPERATIVE DIAGNOSIS:  Ventral hernia.  POSTOPERATIVE DIAGNOSIS:  Ventral hernia.  OPERATION:  Repair of ventral hernia.  SURGEON:  Timothy E. Earlene Plater, M.D.  ANESTHESIA:  General.  INDICATIONS:  [name] is 89, is currently being operated on by Dr. Marcelle Overlie, and because she has a supraumbilical hernia, he has requested surgical repair at this time.  It is well above the umbilicus, and not reachable through a typical laparoscopic incision.  The patient is currently under general anesthesia.  Dr. Marcelle Overlie has completed his procedure, and she is stable, counts are correct.  DESCRIPTION OF PROCEDURE:  The upper abdomen is prepped in the usual fashion. Marcaine 0.25% with epinephrine is used prior to incision.  A short horizontal incision is made over the palpable defect.  The skin and scant subcutaneous tissue is dissected, and defects in the fascia are noted in the midline. There are actually two, they are both quite small, less than 1 cm each.  They are combined together, and closed with interrupted buried 2-0 Prolene sutures. No other defects are noted.  There were no complications.  Counts correct. Closure was accomplished in layers with 4-0 Vicryl.  Skin was closed with Ethibond.  She was stable, and removed to recovery room. Dictated by:   Sheppard Plumber Earlene Plater, M.D. Attending Physician:  Rhina Brackett DD:  01/15/02 TD:  01/15/02 Job: [id number]

## 2011-04-22 NOTE — Op Note (Signed)
NAMELAURABETH, [name]               ACCOUNT NO.:  [id number]   MEDICAL RECORD NO.:  [id number]          PATIENT TYPE:  AMB   LOCATION:  DAY                          FACILITY:  [facility]   PHYSICIAN:  Ollen Gross. Vernell Morgans, M.D. DATE OF BIRTH:  Jul 22, 1961   DATE OF PROCEDURE:  07/07/2005  DATE OF DISCHARGE:                                 OPERATIVE REPORT   PREOPERATIVE DIAGNOSIS:  Anal condyloma.   POSTOPERATIVE DIAGNOSIS:  Anal condyloma.   PROCEDURE:  Destruction of anal condyloma.   SURGEON:  Ollen Gross. Carolynne Edouard, M.D.   ANESTHESIA:  General via LMA.   DESCRIPTION OF PROCEDURE:  After informed consent was obtained, the patient  was brought to the operating room and placed in the supine position on the  operating room table. After adequate induction of general anesthesia, the  patient was placed in the lithotomy position. Her perirectal area was  prepped with Betadine and draped in the usual sterile manner. The patient  had several small spots of condyloma in the perirectal region. Two of these  spots were excised sharply with the Metzenbaum scissors and sent to  pathology. The place where these two areas were excised was fulgurated with  electrocautery until the area was completely hemostatic. The rest of the  lesions on the perirectal skin were destroyed with the electrocautery. A  blunt retractor was used to examine the anal canal, and several small  lesions within the anal canal were also treated with fulguration with  electrocautery. Once this was completed, the area was coated with a  Dibucaine ointment, and sterile dressings were applied. The patient  tolerated the procedure well.  At the end of the case, all needle, sponge,  and instrument counts were correct. The patient's perirectal region was  infiltrated with 0.25% Marcaine with epinephrine, and the patient was  awakened and taken to the recovery room in stable condition.      Ollen Gross. Vernell Morgans, M.D.  Electronically  Signed     PST/MEDQ  D:  07/07/2005  T:  07/07/2005  Job:  [id number]

## 2011-04-22 NOTE — Discharge Summary (Signed)
[facility]  Patient:    [name], [name] Visit Number: [id number] MRN: [id number]          Service Type: GYN Location: [id number] Attending Physician:  Rhina Brackett Dictated by:   Duke Salvia. Marcelle Overlie, M.D. Admit Date:  01/15/2002 Disc. Date: 01/17/02                             Discharge Summary  DISCHARGE DIAGNOSES:          1. Leiomyoma.                               2. Abnormal uterine bleeding.                               3. Pelvic pain.                               4. Ventral hernia.                               5. Laparoscopically assisted vaginal                                  hysterectomy this admission.                               6. Ventral hernia repair by Dr. Earlene Plater.  SUMMARY OF HISTORY AND PHYSICAL EXAMINATION:         Please see admission history and physical for details. Briefly, this is a 50 year old with symptomatic leiomyoma who presents for hysterectomy.  HOSPITAL COURSE:              On February 11, under general anesthesia, the patient underwent LAVH followed by repair of ventral hernia by Dr. Kendrick Ranch. On the first postoperative day, she was complaining of nausea but was tolerating p.o. liquids at that point. Hemoglobin was 12.3. She was afebrile at that point. Her diet was advanced.  By a.m. of February 13, she was feeling much better. She had a temperature maximum of 100.2, tolerating a regular diet, voiding without difficulty. Abdominal exam was unremarkable at that point with the incisions healing well.   LABORATORY DATA:              Preoperative hemoglobin 15.2. On February 11, hemoglobin was 11.6 and on February 12.3.  SMA 23: Potassium slightly low at 3.4. Admission UA was negative. Blood type was A positive, antibody screen negative.  PATHOLOGY:                    The pathology report is still pending.  DISCHARGE MEDICATIONS:        Darvocet-N 100 p.r.n. pain.  DISCHARGE FOLLOWUP:            The patient is to return to the office in one week.  DISCHARGE INSTRUCTIONS:       The patient was advised to report any incisional redness or drainage, increased pain or bleeding, or fever over 101. She was given specific instructions regarding diet, sex, exercise.  CONDITION:  Good.  ACTIVITY:                     Gradually increase. Dictated by:   Duke Salvia. Marcelle Overlie, M.D. Attending Physician:  Rhina Brackett DD:  01/17/02 TD:  01/17/02 Job: [id number]

## 2011-09-05 LAB — D-DIMER, QUANTITATIVE (NOT AT ARMC): D-Dimer, Quant: 0.22

## 2011-09-05 LAB — POCT I-STAT, CHEM 8
BUN: 8
Calcium, Ion: 1.12
Chloride: 103
Creatinine, Ser: 1.1
Glucose, Bld: 120 — ABNORMAL HIGH
HCT: 42
Hemoglobin: 14.3
Potassium: 3.4 — ABNORMAL LOW
Sodium: 139
TCO2: 28

## 2011-09-05 LAB — URINALYSIS, ROUTINE W REFLEX MICROSCOPIC
Bilirubin Urine: NEGATIVE
Glucose, UA: NEGATIVE
Hgb urine dipstick: NEGATIVE
Nitrite: NEGATIVE
Protein, ur: NEGATIVE
Specific Gravity, Urine: 1.013
Urobilinogen, UA: 0.2
pH: 6.5

## 2011-09-05 LAB — CBC
HCT: 41.4
Hemoglobin: 14.1
MCHC: 34.1
MCV: 90.7
Platelets: 226
RBC: 4.57
RDW: 13.9
WBC: 19.3 — ABNORMAL HIGH

## 2011-09-05 LAB — DIFFERENTIAL
Basophils Absolute: 0
Basophils Relative: 0
Eosinophils Absolute: 0.1
Eosinophils Relative: 0
Lymphocytes Relative: 8 — ABNORMAL LOW
Lymphs Abs: 1.5
Monocytes Absolute: 0.9
Monocytes Relative: 5
Neutro Abs: 16.7 — ABNORMAL HIGH
Neutrophils Relative %: 87 — ABNORMAL HIGH

## 2011-09-05 LAB — POCT CARDIAC MARKERS
CKMB, poc: 1.4
CKMB, poc: 1.4
CKMB, poc: <1 — ABNORMAL LOW
Myoglobin, poc: 62.3
Myoglobin, poc: 72.9
Myoglobin, poc: 77
Troponin i, poc: <0.05
Troponin i, poc: <0.05
Troponin i, poc: <0.05

## 2011-09-06 LAB — CARDIAC PANEL(CRET KIN+CKTOT+MB+TROPI)
CK, MB: 0.8
CK, MB: 2.9
CK, MB: 3.4
CK, MB: 4
CK, MB: 5.2 — ABNORMAL HIGH
Relative Index: 1.1
Relative Index: 1.4
Relative Index: 1.7
Relative Index: 1.7
Relative Index: INVALID
Total CK: 229 — ABNORMAL HIGH
Total CK: 239 — ABNORMAL HIGH
Total CK: 260 — ABNORMAL HIGH
Total CK: 314 — ABNORMAL HIGH
Total CK: 77
Troponin I: 0.01
Troponin I: 0.01
Troponin I: 0.02
Troponin I: 0.02
Troponin I: 0.03

## 2011-09-06 LAB — COMPREHENSIVE METABOLIC PANEL
ALT: 20
ALT: 21
AST: 23
AST: 28
Albumin: 3 — ABNORMAL LOW
Albumin: 3.1 — ABNORMAL LOW
Alkaline Phosphatase: 55
Alkaline Phosphatase: 65
BUN: 10
BUN: 8
CO2: 25
CO2: 26
Calcium: 7.7 — ABNORMAL LOW
Calcium: 8 — ABNORMAL LOW
Chloride: 101
Chloride: 102
Creatinine, Ser: 0.9
Creatinine, Ser: 0.92
GFR calc Af Amer: >60
GFR calc Af Amer: >60
GFR calc non Af Amer: >60
GFR calc non Af Amer: >60
Glucose, Bld: 119 — ABNORMAL HIGH
Glucose, Bld: 156 — ABNORMAL HIGH
Potassium: 3.1 — ABNORMAL LOW
Potassium: 3.2 — ABNORMAL LOW
Sodium: 133 — ABNORMAL LOW
Sodium: 134 — ABNORMAL LOW
Total Bilirubin: 0.9
Total Bilirubin: 1
Total Protein: 5.5 — ABNORMAL LOW
Total Protein: 5.8 — ABNORMAL LOW

## 2011-09-06 LAB — BASIC METABOLIC PANEL
BUN: 11
BUN: 3 — ABNORMAL LOW
BUN: 5 — ABNORMAL LOW
BUN: 5 — ABNORMAL LOW
CO2: 24
CO2: 26
CO2: 26
CO2: 28
Calcium: 7.4 — ABNORMAL LOW
Calcium: 7.4 — ABNORMAL LOW
Calcium: 7.4 — ABNORMAL LOW
Calcium: 7.9 — ABNORMAL LOW
Chloride: 106
Chloride: 108
Chloride: 109
Chloride: 110
Creatinine, Ser: 0.74
Creatinine, Ser: 0.76
Creatinine, Ser: 0.79
Creatinine, Ser: 0.89
GFR calc Af Amer: >60
GFR calc Af Amer: >60
GFR calc Af Amer: >60
GFR calc Af Amer: >60
GFR calc non Af Amer: >60
GFR calc non Af Amer: >60
GFR calc non Af Amer: >60
GFR calc non Af Amer: >60
Glucose, Bld: 100 — ABNORMAL HIGH
Glucose, Bld: 94
Glucose, Bld: 96
Glucose, Bld: 96
Potassium: 3.1 — ABNORMAL LOW
Potassium: 3.3 — ABNORMAL LOW
Potassium: 3.3 — ABNORMAL LOW
Potassium: 3.9
Sodium: 135
Sodium: 139
Sodium: 140
Sodium: 141

## 2011-09-06 LAB — URINE CULTURE
Colony Count: 6000
Special Requests: NEGATIVE

## 2011-09-06 LAB — CBC
HCT: 31.2 — ABNORMAL LOW
HCT: 32 — ABNORMAL LOW
HCT: 32.5 — ABNORMAL LOW
HCT: 32.8 — ABNORMAL LOW
HCT: 35.7 — ABNORMAL LOW
Hemoglobin: 10.7 — ABNORMAL LOW
Hemoglobin: 11 — ABNORMAL LOW
Hemoglobin: 11.1 — ABNORMAL LOW
Hemoglobin: 11.2 — ABNORMAL LOW
Hemoglobin: 12.5
MCHC: 33.5
MCHC: 34.3
MCHC: 34.4
MCHC: 34.6
MCHC: 35
MCV: 90.4
MCV: 90.5
MCV: 90.7
MCV: 91.2
MCV: 92.3
Platelets: 164
Platelets: 181
Platelets: 194
Platelets: 216
Platelets: 235
RBC: 3.44 — ABNORMAL LOW
RBC: 3.52 — ABNORMAL LOW
RBC: 3.54 — ABNORMAL LOW
RBC: 3.6 — ABNORMAL LOW
RBC: 3.95
RDW: 13.4
RDW: 13.6
RDW: 13.7
RDW: 13.8
RDW: 13.9
WBC: 13.4 — ABNORMAL HIGH
WBC: 24.3 — ABNORMAL HIGH
WBC: 5
WBC: 5.4
WBC: 6

## 2011-09-06 LAB — CULTURE, BLOOD (ROUTINE X 2)
Culture: NO GROWTH
Culture: NO GROWTH

## 2011-09-06 LAB — LIPID PANEL
Cholesterol: 108
HDL: 52
LDL Cholesterol: 48
Total CHOL/HDL Ratio: 2.1
Triglycerides: 41
VLDL: 8

## 2011-09-06 LAB — B-NATRIURETIC PEPTIDE (CONVERTED LAB)
Pro B Natriuretic peptide (BNP): 123 — ABNORMAL HIGH
Pro B Natriuretic peptide (BNP): 66.9

## 2011-09-06 LAB — TSH: TSH: 1.418

## 2012-10-17 ENCOUNTER — Other Ambulatory Visit: Payer: Self-pay | Admitting: Orthopedic Surgery

## 2012-10-19 ENCOUNTER — Encounter (HOSPITAL_BASED_OUTPATIENT_CLINIC_OR_DEPARTMENT_OTHER): Payer: Self-pay | Admitting: *Deleted

## 2012-10-22 ENCOUNTER — Encounter (HOSPITAL_BASED_OUTPATIENT_CLINIC_OR_DEPARTMENT_OTHER): Payer: Self-pay | Admitting: *Deleted

## 2012-10-22 NOTE — Progress Notes (Signed)
NPO AFTER MN WITH EXCEPTION CLEAR LIQUIDS UNTIL 0900. (NO CREAM/ MILK PRODUCTS).  ARRIVES AT 1300. NEEDS HG. MAY TAKE HYDROCODONE IF NEEDED W/ SIPS OF WATER.

## 2012-10-26 ENCOUNTER — Ambulatory Visit (HOSPITAL_BASED_OUTPATIENT_CLINIC_OR_DEPARTMENT_OTHER)
Admission: RE | Admit: 2012-10-26 | Discharge: 2012-10-26 | Disposition: A | Discharge status: Home / Self Care | Payer: 59 | Source: Ambulatory Visit | Attending: Specialist | Admitting: Specialist

## 2012-10-26 ENCOUNTER — Encounter (HOSPITAL_BASED_OUTPATIENT_CLINIC_OR_DEPARTMENT_OTHER)
Admission: RE | Disposition: A | Discharge status: Home / Self Care | Payer: Self-pay | Source: Ambulatory Visit | Attending: Specialist

## 2012-10-26 ENCOUNTER — Encounter (HOSPITAL_BASED_OUTPATIENT_CLINIC_OR_DEPARTMENT_OTHER): Payer: Self-pay | Admitting: Anesthesiology

## 2012-10-26 ENCOUNTER — Encounter (HOSPITAL_BASED_OUTPATIENT_CLINIC_OR_DEPARTMENT_OTHER): Payer: Self-pay | Admitting: *Deleted

## 2012-10-26 ENCOUNTER — Ambulatory Visit (HOSPITAL_BASED_OUTPATIENT_CLINIC_OR_DEPARTMENT_OTHER): Payer: 59 | Admitting: Anesthesiology

## 2012-10-26 DIAGNOSIS — I059 Rheumatic mitral valve disease, unspecified: Secondary | ICD-10-CM | POA: Insufficient documentation

## 2012-10-26 DIAGNOSIS — M1710 Unilateral primary osteoarthritis, unspecified knee: Secondary | ICD-10-CM | POA: Insufficient documentation

## 2012-10-26 DIAGNOSIS — S83289A Other tear of lateral meniscus, current injury, unspecified knee, initial encounter: Secondary | ICD-10-CM

## 2012-10-26 DIAGNOSIS — X58XXXA Exposure to other specified factors, initial encounter: Secondary | ICD-10-CM | POA: Insufficient documentation

## 2012-10-26 DIAGNOSIS — Z79899 Other long term (current) drug therapy: Secondary | ICD-10-CM | POA: Insufficient documentation

## 2012-10-26 HISTORY — DX: Headache: R51

## 2012-10-26 HISTORY — DX: Nonrheumatic mitral (valve) prolapse: I34.1

## 2012-10-26 HISTORY — DX: Other symptoms and signs involving the musculoskeletal system: R29.898

## 2012-10-26 HISTORY — PX: KNEE ARTHROSCOPY WITH LATERAL MENISECTOMY: SHX6193

## 2012-10-26 HISTORY — DX: Personal history of traumatic brain injury: Z87.820

## 2012-10-26 LAB — POCT HEMOGLOBIN-HEMACUE: Hemoglobin: 14.3 g/dL (ref 12.0–15.0)

## 2012-10-26 SURGERY — ARTHROSCOPY, KNEE, WITH LATERAL MENISCECTOMY
Anesthesia: General | Site: Knee | Laterality: Left | Wound class: Clean

## 2012-10-26 MED ORDER — CEFAZOLIN SODIUM-DEXTROSE 2-3 GM-% IV SOLR
2.0000 g | INTRAVENOUS | Status: DC
  Filled 2012-10-26: qty 50

## 2012-10-26 MED ORDER — PROPOFOL 10 MG/ML IV BOLUS
INTRAVENOUS | Status: DC | PRN
  Administered 2012-10-26: 200 mg via INTRAVENOUS

## 2012-10-26 MED ORDER — LIDOCAINE HCL (CARDIAC) 20 MG/ML IV SOLN
INTRAVENOUS | Status: DC | PRN
  Administered 2012-10-26: 100 mg via INTRAVENOUS

## 2012-10-26 MED ORDER — CHLORHEXIDINE GLUCONATE 4 % EX LIQD
60.0000 mL | Freq: Once | CUTANEOUS | Status: DC
  Filled 2012-10-26: qty 60

## 2012-10-26 MED ORDER — LACTATED RINGERS IV SOLN
INTRAVENOUS | Status: DC | PRN
  Administered 2012-10-26 (×2): via INTRAVENOUS

## 2012-10-26 MED ORDER — MIDAZOLAM HCL 5 MG/5ML IJ SOLN
INTRAMUSCULAR | Status: DC | PRN
  Administered 2012-10-26: 2 mg via INTRAVENOUS

## 2012-10-26 MED ORDER — SODIUM CHLORIDE 0.9 % IR SOLN
Status: DC | PRN
  Administered 2012-10-26: 16:00:00

## 2012-10-26 MED ORDER — BUPIVACAINE-EPINEPHRINE 0.5% -1:200000 IJ SOLN
INTRAMUSCULAR | Status: DC | PRN
  Administered 2012-10-26: 30 mL

## 2012-10-26 MED ORDER — FENTANYL CITRATE 0.05 MG/ML IJ SOLN
INTRAMUSCULAR | Status: DC | PRN
  Administered 2012-10-26 (×2): 25 ug via INTRAVENOUS
  Administered 2012-10-26: 50 ug via INTRAVENOUS
  Administered 2012-10-26: 100 ug via INTRAVENOUS

## 2012-10-26 MED ORDER — LACTATED RINGERS IV SOLN
INTRAVENOUS | Status: DC
  Filled 2012-10-26: qty 1000

## 2012-10-26 MED ORDER — LACTATED RINGERS IV SOLN
INTRAVENOUS | Status: DC | PRN
  Administered 2012-10-26: 17:00:00

## 2012-10-26 MED ORDER — OXYCODONE-ACETAMINOPHEN 7.5-325 MG PO TABS: 1.0000 | ORAL_TABLET | ORAL | Status: DC | PRN

## 2012-10-26 MED ORDER — CEFAZOLIN SODIUM-DEXTROSE 2-3 GM-% IV SOLR
INTRAVENOUS | Status: DC | PRN
  Administered 2012-10-26: 2 g via INTRAVENOUS

## 2012-10-26 MED ORDER — LACTATED RINGERS IV SOLN
INTRAVENOUS | Status: DC
  Administered 2012-10-26: 14:00:00 via INTRAVENOUS
  Filled 2012-10-26: qty 1000

## 2012-10-26 MED ORDER — ONDANSETRON HCL 4 MG/2ML IJ SOLN
INTRAMUSCULAR | Status: DC | PRN
  Administered 2012-10-26: 4 mg via INTRAVENOUS

## 2012-10-26 MED ORDER — SODIUM CHLORIDE 0.9 % IR SOLN: Status: DC | PRN

## 2012-10-26 SURGICAL SUPPLY — 29 items
BANDAGE ELASTIC 6 VELCRO ST LF (GAUZE/BANDAGES/DRESSINGS) ×2 IMPLANT
BLADE 4.2CUDA (BLADE) IMPLANT
BLADE CUDA SHAVER 3.5 (BLADE) ×2 IMPLANT
BOOTIES KNEE HIGH SLOAN (MISCELLANEOUS) ×2 IMPLANT
CANISTER SUCT LVC 12 LTR MEDI- (MISCELLANEOUS) ×2 IMPLANT
CLOTH BEACON ORANGE TIMEOUT ST (SAFETY) ×2 IMPLANT
DRAPE ARTHROSCOPY W/POUCH 114 (DRAPES) ×2 IMPLANT
DURAPREP 26ML APPLICATOR (WOUND CARE) ×2 IMPLANT
GAUZE SPONGE 4X4 12PLY STRL LF (GAUZE/BANDAGES/DRESSINGS) ×2 IMPLANT
GLOVE BIO SURGEON STRL SZ 6.5 (GLOVE) ×2 IMPLANT
GLOVE ECLIPSE 6.0 STRL STRAW (GLOVE) ×2 IMPLANT
GLOVE SURG SS PI 8.0 STRL IVOR (GLOVE) ×2 IMPLANT
GOWN PREVENTION PLUS LG XLONG (DISPOSABLE) ×2 IMPLANT
GOWN STRL REIN XL XLG (GOWN DISPOSABLE) ×2 IMPLANT
IV NS IRRIG 3000ML ARTHROMATIC (IV SOLUTION) ×4 IMPLANT
KNEE WRAP E Z 3 GEL PACK (MISCELLANEOUS) ×2 IMPLANT
NEEDLE FILTER BLUNT 18X 1/2SAF (NEEDLE) ×1
NEEDLE FILTER BLUNT 18X1 1/2 (NEEDLE) ×1 IMPLANT
PACK ARTHROSCOPY DSU (CUSTOM PROCEDURE TRAY) ×2 IMPLANT
PACK BASIN DAY SURGERY FS (CUSTOM PROCEDURE TRAY) ×2 IMPLANT
PADDING CAST COTTON 6X4 STRL (CAST SUPPLIES) ×2 IMPLANT
SET ARTHROSCOPY TUBING (MISCELLANEOUS) ×1
SET ARTHROSCOPY TUBING LN (MISCELLANEOUS) ×1 IMPLANT
SPONGE GAUZE 4X4 12PLY (GAUZE/BANDAGES/DRESSINGS) ×2 IMPLANT
SUT ETHILON 4 0 PS 2 18 (SUTURE) ×2 IMPLANT
SYR 30ML LL (SYRINGE) ×2 IMPLANT
SYRINGE 10CC LL (SYRINGE) ×2 IMPLANT
TOWEL OR 17X24 6PK STRL BLUE (TOWEL DISPOSABLE) ×2 IMPLANT
WATER STERILE IRR 500ML POUR (IV SOLUTION) ×2 IMPLANT

## 2012-10-26 NOTE — Anesthesia Procedure Notes (Signed)
Procedure Name: LMA Insertion Date/Time: 10/26/2012 3:59 PM Performed by: Jessica Priest Pre-anesthesia Checklist: Patient identified, Emergency Drugs available, Suction available and Patient being monitored Patient Re-evaluated:Patient Re-evaluated prior to inductionOxygen Delivery Method: Circle System Utilized Preoxygenation: Pre-oxygenation with 100% oxygen Intubation Type: IV induction Ventilation: Mask ventilation without difficulty LMA: LMA inserted LMA Size: 4.0 Number of attempts: 1 Airway Equipment and Method: bite block Placement Confirmation: positive ETCO2 Tube secured with: Tape Dental Injury: Teeth and Oropharynx as per pre-operative assessment

## 2012-10-26 NOTE — H&P (Signed)
[name] is an 51 y.o. female.   Chief Complaint: left knee pain HPI: Meniscus tear left knee refractory  Past Medical History  Diagnosis Date  . MVP (mitral valve prolapse) HX HEART RACING  YRS AGO    ASYMPTOMATIC SINCE THEN  . Weakness of right leg SECONDARY TO MS  . History of concussion YOUNG ADULT--  NO RESIDUAL  . Headache   . History of edema PERIPHERAL LYMPHEDEMA    Past Surgical History  Procedure Date  . Destruction of anal condyloma 07-07-2005  DR TOTH  . Laparoscopic assisted vaginal hysterectomy 01-15-2002  DR Gerlene Burdock [facility]/ DR TIM DAVIS    AND REPAIR VENTRAL HERNIA  . Cesarean section X3    BILATERAL TUBAL LIGATION W/ LAST ONE    History reviewed. No pertinent family history. Social History:  reports that she has never smoked. She has never used smokeless tobacco. She reports that she does not drink alcohol or use illicit drugs.  Allergies:  Allergies  Allergen Reactions  . Ibuprofen Other (See Comments)    CAUSES LYMPHEDEMA  . Nsaids Other (See Comments)    AVOIDS ANY SODIUM CONTAINING MED. -- CAUSES LYMPHEDEMA    Medications Prior to Admission  Medication Sig Dispense Refill  . ALPRAZolam (XANAX) 0.5 MG tablet Take 0.5 mg by mouth at bedtime as needed.      . Cholecalciferol (VITAMIN D3) 5000 UNITS CAPS Take 1 capsule by mouth daily.      Marland Kitchen desvenlafaxine (PRISTIQ) 100 MG 24 hr tablet Take 100 mg by mouth daily.      . Dimethyl Fumarate (TECFIDERA) 240 MG CPDR Take 2 tablets by mouth daily.      Marland Kitchen gabapentin (NEURONTIN) 600 MG tablet Take 600 mg by mouth 4 (four) times daily as needed.      Marland Kitchen HYDROcodone-acetaminophen (NORCO) 7.5-325 MG per tablet Take 1 tablet by mouth every 4 (four) hours as needed.      . methylphenidate (RITALIN) 20 MG tablet Take 20 mg by mouth 2 (two) times daily. 2 TABS IN AM AND ONE TAB AT LUNCH        Results for orders placed during the hospital encounter of 10/26/12 (from the past 48 hour(s))  POCT HEMOGLOBIN-HEMACUE      Status: Abnormal   Collection Time   10/26/12  2:20 PM      Component Value Range Comment   Hemoglobin 4.9 (*) 12.0 - 15.0 g/dL   POCT HEMOGLOBIN-HEMACUE     Status: Normal   Collection Time   10/26/12  2:22 PM      Component Value Range Comment   Hemoglobin 14.3  12.0 - 15.0 g/dL    No results found.  Review of Systems  Constitutional: Negative.   HENT: Negative.   Respiratory: Negative.   Musculoskeletal: Positive for joint pain.  Skin: Negative.   All other systems reviewed and are negative.    Blood pressure 133/65, pulse 83, temperature 97.3 F (36.3 C), temperature source Oral, resp. rate 18, height 5\' 5"  (1.651 m), weight 81.647 kg (180 lb), SpO2 96.00%. Physical Exam  Vitals reviewed. Constitutional: She is oriented to person, place, and time. She appears well-developed.  HENT:  Head: Normocephalic.  Eyes: Pupils are equal, round, and reactive to light.  Neck: Normal range of motion.  Cardiovascular: Normal rate.   Respiratory: Effort normal.  GI: Soft.  Musculoskeletal: She exhibits edema and tenderness.       LJLT. +Mcmurray left. -DVT  Neurological: She is alert and  oriented to person, place, and time.  Skin: Skin is warm and dry.  Psychiatric: She has a normal mood and affect.   MRI knee lateral meniscus tear DJD.  Assessment/Plan Symptomatic lateral meniscus tear left knee, DJD refractory. Plan arthroscopy partial menisectomy. Risks discussed.  [name] C 10/26/2012, 2:40 PM

## 2012-10-26 NOTE — Anesthesia Postprocedure Evaluation (Signed)
Anesthesia Post Note  Patient: [name]  Procedure(s) Performed: Procedure(s) (LRB): KNEE ARTHROSCOPY WITH LATERAL MENISECTOMY (Left)  Anesthesia type: General  Patient location: PACU  Post pain: Pain level controlled  Post assessment: Post-op Vital signs reviewed  Last Vitals: BP 140/73  Pulse 88  Temp 36.4 C (Oral)  Resp 14  Ht 5\' 5"  (1.651 m)  Wt 180 lb (81.647 kg)  BMI 29.95 kg/m2  SpO2 100%  Post vital signs: Reviewed  Level of consciousness: sedated  Complications: No apparent anesthesia complications

## 2012-10-26 NOTE — Brief Op Note (Signed)
10/26/2012  4:35 PM  PATIENT:  [name]  51 y.o. female  PRE-OPERATIVE DIAGNOSIS:  Lateral Meniscus Tear on the Left and DJD  POST-OPERATIVE DIAGNOSIS:  Lateral Meniscus Tear on the Left and DJD  PROCEDURE:  Procedure(s) (LRB) with comments: KNEE ARTHROSCOPY WITH LATERAL MENISECTOMY (Left) - Left Knee Arthrsocopy with Partial Lateral Menisectomy  SURGEON:  Surgeon(s) and Role:    * Javier Docker, MD - Primary  PHYSICIAN ASSISTANT:   ASSISTANTS: none   ANESTHESIA:   general  EBL:  Total I/O In: 1000 [I.V.:1000] Out: -   BLOOD ADMINISTERED:none  DRAINS: none   LOCAL MEDICATIONS USED:  MARCAINE     SPECIMEN:  No Specimen  DISPOSITION OF SPECIMEN:  N/A  COUNTS:  YES  TOURNIQUET:  * No tourniquets in log *  DICTATION: .Other Dictation: Dictation Number [id number]  PLAN OF CARE: Discharge to home after PACU  PATIENT DISPOSITION:  PACU - hemodynamically stable.   Delay start of Pharmacological VTE agent (>24hrs) due to surgical blood loss or risk of bleeding: no

## 2012-10-26 NOTE — Anesthesia Preprocedure Evaluation (Addendum)
Anesthesia Evaluation  Patient identified by MRN, date of birth, ID band Patient awake    Reviewed: Allergy & Precautions, H&P , NPO status , Patient's Chart, lab work & pertinent test results  Airway Mallampati: III TM Distance: >3 FB Neck ROM: Full    Dental  (+) Dental Advisory Given and Teeth Intact   Pulmonary neg pulmonary ROS,  breath sounds clear to auscultation  Pulmonary exam normal       Cardiovascular - CAD and - Past MI + Valvular Problems/Murmurs MVP Rhythm:Regular Rate:Normal     Neuro/Psych  Headaches, Multiple sclerosis, no exacerbations in last 2 months  Neuromuscular disease negative psych ROS   GI/Hepatic negative GI ROS, Neg liver ROS,   Endo/Other  negative endocrine ROS  Renal/GU negative Renal ROS     Musculoskeletal negative musculoskeletal ROS (+)   Abdominal   Peds  Hematology negative hematology ROS (+)   Anesthesia Other Findings   Reproductive/Obstetrics                          Anesthesia Physical Anesthesia Plan  ASA: III  Anesthesia Plan: General   Post-op Pain Management:    Induction: Intravenous  Airway Management Planned: LMA  Additional Equipment:   Intra-op Plan:   Post-operative Plan: Extubation in OR  Informed Consent: I have reviewed the patients History and Physical, chart, labs and discussed the procedure including the risks, benefits and alternatives for the proposed anesthesia with the patient or authorized representative who has indicated his/her understanding and acceptance.   Dental advisory given  Plan Discussed with: CRNA  Anesthesia Plan Comments: (Normothermia, avoiding hyperthermia)       Anesthesia Quick Evaluation

## 2012-10-26 NOTE — Transfer of Care (Signed)
Immediate Anesthesia Transfer of Care Note  Patient: [name]  Procedure(s) Performed: Procedure(s) (LRB): KNEE ARTHROSCOPY WITH LATERAL MENISECTOMY (Left)  Patient Location: PACU  Anesthesia Type: General  Level of Consciousness: awake, sedated, patient cooperative and responds to stimulation  Airway & Oxygen Therapy: Patient Spontanous Breathing and Patient connected to face mask oxygen  Post-op Assessment: Report given to PACU RN, Post -op Vital signs reviewed and stable and Patient moving all extremities  Post vital signs: Reviewed and stable  Complications: No apparent anesthesia complications

## 2012-10-29 ENCOUNTER — Encounter (HOSPITAL_BASED_OUTPATIENT_CLINIC_OR_DEPARTMENT_OTHER): Payer: Self-pay | Admitting: Specialist

## 2012-10-29 LAB — POCT HEMOGLOBIN-HEMACUE: Hemoglobin: 4.9 g/dL — CL (ref 12.0–15.0)

## 2012-10-29 NOTE — Op Note (Signed)
[name], [name]               ACCOUNT NO.:  [id number]  MEDICAL RECORD NO.:  [id number]  LOCATION:                                 FACILITY:  PHYSICIAN:  Jene Every, M.D.         DATE OF BIRTH:  DATE OF PROCEDURE:  10/26/2012 DATE OF DISCHARGE:                              OPERATIVE REPORT   PREOPERATIVE DIAGNOSIS:  Lateral meniscus tear, degenerative joint disease, left knee.  POSTOPERATIVE DIAGNOSIS:  Lateral meniscus tear, degenerative joint disease, left knee, extensive grade 4 changes, lateral compartment, patellofemoral joint.  PROCEDURES PERFORMED: 1. Left knee arthroscopy followed by exam under anesthesia. 2. Partial lateral meniscectomy. 3. Chondroplasty of lateral femoral condyle patella.  ANESTHESIA:  General.  ASSISTANT:  No assistant.  HISTORY:  This is a 51 year old female with a longstanding history of knee pain, lack of motion and fusion, locking, popping, giving way.  MRI in the past 4-5 years ago indicated lateral meniscus tear.  She had persistent symptoms with that.  She was initially to undergo arthroscopic treatment, but had delayed that.  She had recent corticosteroid injection activity modification, some degenerative changes of the lateral compartment.  She had a McMurry on exam.  We discussed knee arthroscopy, partial lateral meniscectomy, debridement, and failing conservative treatment.  Risks and benefits were discussed including bleeding, infection, damage to neurovascular structure, DVT, PE, anesthetic complications, etc.  Also, the probable need for total knee arthroplasty in the future.  With mechanical symptoms, we felt removal of the meniscal tear and debridement would diminish as an effort to forego the total knee.  TECHNIQUE:  With the patient in supine position, after induction of adequate general anesthesia, 2 g Kefzol, left lower extremity was prepped and draped in usual sterile fashion.  Exam under anesthesia revealed  approximately 5 degrees of lack of extension and was able to achieve 90 degrees of flexion.  A lateral parapatellar portal was fashioned with #11 blade.  Ingress cannula atraumatically placed. Irrigant was utilized to insufflate the joint.  Under direct visualization, medial parapatellar portal was fashioned with #11 blade after localization with an 18-gauge needle sparing the medial meniscus. Noted medial compartment revealed some minor grade 2 changes of the medial compartment.  The meniscus was unremarkable as well.  The ACL was unremarkable.  The ACL demonstrates fraying of the lateral compartment with extensive grade 4 changes of femoral condyle and the tibial plateau.  There was a displaced meniscal tear from the posterior horn and the anterior horn, both were debrided and resected to a stable base. Loose cartilaginous debris was evacuated as well.  The remnant of the meniscus was stable to probe palpation, there was extensive portion of the meniscus that was absent.  Following this with flexion and extension, there was no interposition of the remaining meniscus.  Suprapatellar pouch revealed extensive grade 3 changes of the patella, normal patellofemoral tracking.  The sulcus grade 3-4 changes as well. Light chondroplasty performed here.  Gutters were unremarkable.  Revisit all compartments, no further pathology amenable to arthroscopic intervention.  Therefore, removed all instrumentation.  Portals were closed with 4-0 nylon simple sutures, 0.25% Marcaine with epinephrine was infiltrated in the joint.  Wound was dressed sterilely and just prior to that, we ranged her.  We were able to achieve approximately 20 degrees more flexion following the meniscal resection.  She was extubated without difficulty and transported to the recovery room in satisfactory condition.  The patient tolerated the procedure well.  No complications.  Minimal blood loss.     Jene Every,  M.D.     [name]  D:  10/26/2012  T:  10/27/2012  Job:  [id number]

## 2013-03-08 ENCOUNTER — Ambulatory Visit (INDEPENDENT_AMBULATORY_CARE_PROVIDER_SITE_OTHER): Payer: 59 | Admitting: General Surgery

## 2013-03-08 ENCOUNTER — Encounter (INDEPENDENT_AMBULATORY_CARE_PROVIDER_SITE_OTHER): Payer: Self-pay | Admitting: General Surgery

## 2013-03-08 VITALS — BP 132/80 | HR 80 | Temp 97.9°F | Resp 14 | Ht 65.0 in | Wt 171.0 lb

## 2013-03-08 DIAGNOSIS — A630 Anogenital (venereal) warts: Secondary | ICD-10-CM | POA: Insufficient documentation

## 2013-03-08 NOTE — Patient Instructions (Signed)
Will refer to Dr. [name], colorectal specialist

## 2013-03-12 ENCOUNTER — Encounter (INDEPENDENT_AMBULATORY_CARE_PROVIDER_SITE_OTHER): Payer: Self-pay | Admitting: General Surgery

## 2013-03-12 NOTE — Progress Notes (Signed)
Subjective:     Patient ID: [name], female   DOB: Dec 09, 1960, 52 y.o.   MRN: [id number]  HPI We are asked to see the patient in consultation by Dr. Lenord Fellers to evaluate her for anal condyloma. The patient is a 52 white female who has had a small lump in her gluteal cleft area for the last 2 years. It has gradually gotten a little bit larger. She does experience some itching but denies any bleeding. She has normal bowel movements and denies any problems with constipation or diarrhea  Review of Systems  Constitutional: Negative.   HENT: Negative.   Eyes: Negative.   Respiratory: Negative.   Cardiovascular: Negative.   Gastrointestinal: Negative.   Endocrine: Negative.   Genitourinary: Negative.   Musculoskeletal: Negative.   Skin: Negative.   Allergic/Immunologic: Negative.   Neurological: Negative.   Hematological: Negative.   Psychiatric/Behavioral: Negative.        Objective:   Physical Exam  Constitutional: She is oriented to person, place, and time. She appears well-developed and well-nourished.  HENT:  Head: Normocephalic and atraumatic.  Eyes: Conjunctivae and EOM are normal. Pupils are equal, round, and reactive to light.  Neck: Normal range of motion. Neck supple.  Cardiovascular: Normal rate, regular rhythm and normal heart sounds.   Pulmonary/Chest: Effort normal and breath sounds normal.  Abdominal: Soft. Bowel sounds are normal.  Genitourinary:  The perirectal scan has a normal appearance. In the gluteal cleft area there is a small verucous lesion.  Musculoskeletal: Normal range of motion.  Neurological: She is alert and oriented to person, place, and time.  Skin: Skin is warm and dry.  Psychiatric: She has a normal mood and affect. Her behavior is normal.       Assessment:     The patient appears to have one small area of anal condyloma in her gluteal cleft. This was treated with podophylin  And covered with a cotton ball.    Plan:     At this point  we will refer her to our colorectal specialist Dr. [name] for possible high resolution endoscopy and further management.

## 2013-03-29 ENCOUNTER — Emergency Department (HOSPITAL_COMMUNITY): Payer: 59

## 2013-03-29 ENCOUNTER — Encounter (HOSPITAL_COMMUNITY): Payer: Self-pay | Admitting: Family Medicine

## 2013-03-29 ENCOUNTER — Emergency Department (HOSPITAL_COMMUNITY)
Admission: EM | Admit: 2013-03-29 | Discharge: 2013-03-29 | Disposition: A | Discharge status: Home / Self Care | Payer: 59 | Attending: Emergency Medicine | Admitting: Emergency Medicine

## 2013-03-29 DIAGNOSIS — Z8679 Personal history of other diseases of the circulatory system: Secondary | ICD-10-CM | POA: Insufficient documentation

## 2013-03-29 DIAGNOSIS — R112 Nausea with vomiting, unspecified: Secondary | ICD-10-CM | POA: Insufficient documentation

## 2013-03-29 DIAGNOSIS — Z79899 Other long term (current) drug therapy: Secondary | ICD-10-CM | POA: Insufficient documentation

## 2013-03-29 DIAGNOSIS — Z87828 Personal history of other (healed) physical injury and trauma: Secondary | ICD-10-CM | POA: Insufficient documentation

## 2013-03-29 DIAGNOSIS — Z8739 Personal history of other diseases of the musculoskeletal system and connective tissue: Secondary | ICD-10-CM | POA: Insufficient documentation

## 2013-03-29 DIAGNOSIS — Z9889 Other specified postprocedural states: Secondary | ICD-10-CM | POA: Insufficient documentation

## 2013-03-29 DIAGNOSIS — R6883 Chills (without fever): Secondary | ICD-10-CM | POA: Insufficient documentation

## 2013-03-29 DIAGNOSIS — N390 Urinary tract infection, site not specified: Secondary | ICD-10-CM | POA: Insufficient documentation

## 2013-03-29 DIAGNOSIS — R109 Unspecified abdominal pain: Secondary | ICD-10-CM | POA: Insufficient documentation

## 2013-03-29 LAB — BASIC METABOLIC PANEL
BUN: 18 mg/dL (ref 6–23)
CO2: 26 mEq/L (ref 19–32)
Calcium: 10.1 mg/dL (ref 8.4–10.5)
Chloride: 99 mEq/L (ref 96–112)
Creatinine, Ser: 0.82 mg/dL (ref 0.50–1.10)
GFR calc Af Amer: >90 mL/min (ref >90)
GFR calc non Af Amer: 80 mL/min — ABNORMAL LOW (ref >90)
Glucose, Bld: 95 mg/dL (ref 70–99)
Potassium: 3.9 mEq/L (ref 3.5–5.1)
Sodium: 138 mEq/L (ref 135–145)

## 2013-03-29 LAB — URINALYSIS, ROUTINE W REFLEX MICROSCOPIC
Bilirubin Urine: NEGATIVE
Glucose, UA: NEGATIVE mg/dL
Hgb urine dipstick: NEGATIVE
Ketones, ur: 15 mg/dL — AB
Nitrite: NEGATIVE
Protein, ur: NEGATIVE mg/dL
Specific Gravity, Urine: 1.017 (ref 1.005–1.030)
Urobilinogen, UA: 0.2 mg/dL (ref 0.0–1.0)
pH: 7.5 (ref 5.0–8.0)

## 2013-03-29 LAB — HEPATIC FUNCTION PANEL
ALT: 21 U/L (ref 0–35)
AST: 26 U/L (ref 0–37)
Albumin: 4.3 g/dL (ref 3.5–5.2)
Alkaline Phosphatase: 100 U/L (ref 39–117)
Bilirubin, Direct: <0.1 mg/dL (ref 0.0–0.3)
Total Bilirubin: 0.4 mg/dL (ref 0.3–1.2)
Total Protein: 7.6 g/dL (ref 6.0–8.3)

## 2013-03-29 LAB — CBC WITH DIFFERENTIAL/PLATELET
Basophils Absolute: 0 10*3/uL (ref 0.0–0.1)
Basophils Relative: 0 % (ref 0–1)
Eosinophils Absolute: 0 10*3/uL (ref 0.0–0.7)
Eosinophils Relative: 1 % (ref 0–5)
HCT: 41.2 % (ref 36.0–46.0)
Hemoglobin: 14.3 g/dL (ref 12.0–15.0)
Lymphocytes Relative: 25 % (ref 12–46)
Lymphs Abs: 0.8 10*3/uL (ref 0.7–4.0)
MCH: 29.6 pg (ref 26.0–34.0)
MCHC: 34.7 g/dL (ref 30.0–36.0)
MCV: 85.3 fL (ref 78.0–100.0)
Monocytes Absolute: 0.3 10*3/uL (ref 0.1–1.0)
Monocytes Relative: 11 % (ref 3–12)
Neutro Abs: 1.9 10*3/uL (ref 1.7–7.7)
Neutrophils Relative %: 63 % (ref 43–77)
Platelets: 230 10*3/uL (ref 150–400)
RBC: 4.83 MIL/uL (ref 3.87–5.11)
RDW: 13 % (ref 11.5–15.5)
WBC: 3 10*3/uL — ABNORMAL LOW (ref 4.0–10.5)

## 2013-03-29 LAB — URINE MICROSCOPIC-ADD ON

## 2013-03-29 LAB — LIPASE, BLOOD: Lipase: 19 U/L (ref 11–59)

## 2013-03-29 MED ORDER — ONDANSETRON 4 MG PO TBDP: 4.0000 mg | ORAL_TABLET | Freq: Three times a day (TID) | ORAL | Status: DC | PRN

## 2013-03-29 MED ORDER — NITROFURANTOIN MONOHYD MACRO 100 MG PO CAPS: 100.0000 mg | ORAL_CAPSULE | Freq: Two times a day (BID) | ORAL | Status: DC

## 2013-03-29 MED ORDER — HYDROMORPHONE HCL PF 1 MG/ML IJ SOLN
1.0000 mg | Freq: Once | INTRAMUSCULAR | Status: AC
  Administered 2013-03-29: 1 mg via INTRAVENOUS
  Filled 2013-03-29: qty 1

## 2013-03-29 MED ORDER — ONDANSETRON HCL 4 MG/2ML IJ SOLN
4.0000 mg | Freq: Once | INTRAMUSCULAR | Status: AC
  Administered 2013-03-29: 4 mg via INTRAVENOUS
  Filled 2013-03-29: qty 2

## 2013-03-29 MED ORDER — HYDROCODONE-ACETAMINOPHEN 5-325 MG PO TABS: 2.0000 | ORAL_TABLET | Freq: Four times a day (QID) | ORAL | Status: DC | PRN

## 2013-03-29 MED ORDER — PANTOPRAZOLE SODIUM 40 MG IV SOLR
40.0000 mg | Freq: Once | INTRAVENOUS | Status: AC
  Administered 2013-03-29: 40 mg via INTRAVENOUS
  Filled 2013-03-29: qty 40

## 2013-03-29 MED ORDER — IOHEXOL 300 MG/ML  SOLN
100.0000 mL | Freq: Once | INTRAMUSCULAR | Status: AC | PRN
  Administered 2013-03-29: 100 mL via INTRAVENOUS

## 2013-03-29 MED ORDER — HYDROCODONE-ACETAMINOPHEN 5-325 MG PO TABS: 1.0000 | ORAL_TABLET | Freq: Four times a day (QID) | ORAL | Status: DC | PRN

## 2013-03-29 MED ORDER — IOHEXOL 300 MG/ML  SOLN
50.0000 mL | Freq: Once | INTRAMUSCULAR | Status: AC | PRN
  Administered 2013-03-29: 50 mL via ORAL

## 2013-03-29 NOTE — ED Notes (Signed)
Per pt sts RUQ pain associated with nausea and white stools.

## 2013-03-29 NOTE — ED Provider Notes (Signed)
History     CSN: [id number]  Arrival date & time 03/29/13  1439   First MD Initiated Contact with Patient 03/29/13 1515      Chief Complaint  Patient presents with  . Abdominal Pain    (Consider location/radiation/quality/duration/timing/severity/associated sxs/prior treatment) HPI Comments: Patient presents with a chief complaint of RUQ and epigastric abdominal pain.  She reports that the pain has been intermittent over the past 3 days.  Pain does not radiate.  Pain worse after eating.  She describes the pain as a "cramping" and "burning" pain.  Pain does not radiate.  She reports that she has also had light colored stool.  Pain associated with nausea and one episode of vomiting yesterday.  She has not taken anything for her symptoms prior to arrival.  She denies diarrhea or constipation.  She has had chills, but denies fever. She denies chest pain or shortness of breath.  No prior history of Gallbladder disease.  Past surgical history significant for C-Section and Vaginal Hysterectomy.    The history is provided by the patient.    Past Medical History  Diagnosis Date  . MVP (mitral valve prolapse) HX HEART RACING  YRS AGO    ASYMPTOMATIC SINCE THEN  . Weakness of right leg SECONDARY TO MS  . History of concussion YOUNG ADULT--  NO RESIDUAL  . Headache   . History of edema PERIPHERAL LYMPHEDEMA  . Arthritis   . Bone spur     Past Surgical History  Procedure Laterality Date  . Destruction of anal condyloma  07-07-2005  DR TOTH  . Laparoscopic assisted vaginal hysterectomy  01-15-2002  DR Gerlene Burdock [facility]/ DR TIM DAVIS    AND REPAIR VENTRAL HERNIA  . Cesarean section  X3    BILATERAL TUBAL LIGATION W/ LAST ONE  . Knee arthroscopy with lateral menisectomy  10/26/2012    Procedure: KNEE ARTHROSCOPY WITH LATERAL MENISECTOMY;  Surgeon: Javier Docker, MD;  Location: [facility];  Service: Orthopedics;  Laterality: Left;  Left Knee Arthrsocopy with Partial Lateral  Menisectomy    Family History  Problem Relation Age of Onset  . Diabetes Mother   . Hypertension Mother   . Bowel Disease Brother   . Diabetes Brother   . Hypertension Brother   . Hyperlipidemia Brother   . Emphysema Father     History  Substance Use Topics  . Smoking status: Never Smoker   . Smokeless tobacco: Never Used  . Alcohol Use: No    OB History   Grav Para Term Preterm Abortions TAB SAB Ect Mult Living                  Review of Systems  Constitutional: Negative for fever and chills.  Respiratory: Negative for shortness of breath.   Cardiovascular: Negative for chest pain.  Gastrointestinal: Positive for nausea, vomiting and abdominal pain. Negative for diarrhea and constipation.       Light colored stool  Genitourinary: Negative for dysuria and pelvic pain.  All other systems reviewed and are negative.    Allergies  Ibuprofen and Nsaids  Home Medications   Current Outpatient Rx  Name  Route  Sig  Dispense  Refill  . ALPRAZolam (XANAX) 0.5 MG tablet   Oral   Take 0.5 mg by mouth at bedtime as needed.         . desvenlafaxine (PRISTIQ) 100 MG 24 hr tablet   Oral   Take 100 mg by mouth daily.         Marland Kitchen  Dimethyl Fumarate (TECFIDERA) 240 MG CPDR   Oral   Take 2 tablets by mouth daily.         Marland Kitchen gabapentin (NEURONTIN) 600 MG tablet   Oral   Take 600 mg by mouth 4 (four) times daily as needed.         Marland Kitchen HYDROcodone-acetaminophen (NORCO) 7.5-325 MG per tablet   Oral   Take 1 tablet by mouth every 4 (four) hours as needed.         . methylphenidate (RITALIN) 20 MG tablet   Oral   Take 20 mg by mouth 2 (two) times daily. 2 TABS IN AM AND ONE TAB AT LUNCH           BP 135/96  Pulse 70  Temp(Src) 97.9 F (36.6 C)  Resp 18  SpO2 97%  Physical Exam  Nursing note and vitals reviewed. Constitutional: She appears well-developed and well-nourished. No distress.  HENT:  Head: Normocephalic and atraumatic.  Mouth/Throat: Oropharynx  is clear and moist.  Neck: Normal range of motion. Neck supple.  Cardiovascular: Normal rate, regular rhythm and normal heart sounds.   Pulmonary/Chest: Effort normal and breath sounds normal.  Abdominal: Soft. Bowel sounds are normal. She exhibits no distension and no mass. There is tenderness in the right upper quadrant and epigastric area. There is positive Murphy's sign. There is no rebound, no guarding, no CVA tenderness and no tenderness at McBurney's point.  Neurological: She is alert.  Skin: Skin is warm and dry. She is not diaphoretic.  Psychiatric: She has a normal mood and affect.    ED Course  Procedures (including critical care time)  Labs Reviewed  CBC WITH DIFFERENTIAL  BASIC METABOLIC PANEL  LIPASE, BLOOD  URINALYSIS, ROUTINE W REFLEX MICROSCOPIC  HEPATIC FUNCTION PANEL   US Abdomen Complete  03/29/2013  *RADIOLOGY REPORT*  Clinical Data:  Right upper quadrant pain  COMPLETE ABDOMINAL ULTRASOUND  Comparison:  CT abdomen/pelvis 06/24/2010; prior abdominal ultrasound 05/19/2009  Findings:  Gallbladder:  No gallstones, gallbladder wall thickening, or pericholecystic fluid.  Common bile duct:  Within normal limits at 4 mm in caliber.  Liver:  Small 7 mm echogenic focus in the central aspect of hepatic segment for a most consistent with a small benign hemangioma after correlation with prior CT scan. Within normal limits in parenchymal echogenicity.  IVC:  Appears normal.  Pancreas:  No focal abnormality seen.  Spleen:  Within normal limits for size is 7.3 cm.  No focal lesion identified.  Right Kidney:  Normal reniform contour without hydronephrosis, nephrolithiasis or solid lesion. Normal parenchymal echogenicity .9.8 cm in length.  Left Kidney:  Normal reniform contour without hydronephrosis, nephrolithiasis or solid lesion. Normal parenchymal echogenicity.9.7 cm in length.  Abdominal aorta:  No aneurysm identified.  IMPRESSION:  Negative abdominal ultrasound.   Original Report  Authenticated By: Malachy Moan, M.D.    Ct Abdomen Pelvis W Contrast  03/29/2013  *RADIOLOGY REPORT*  Clinical Data: 52 year old female with right-sided abdominal and pelvic pain.  CT ABDOMEN AND PELVIS WITH CONTRAST  Technique:  Multidetector CT imaging of the abdomen and pelvis was performed following the standard protocol during bolus administration of intravenous contrast.  Contrast: OMNIPAQUE IOHEXOL 300 MG/ML  SOLN  Comparison: 06/24/2010 CT  Findings: Minimal right middle lobe atelectasis/scarring again noted.  The liver, spleen, adrenal glands, kidneys, pancreas and gallbladder are unremarkable. A moderate amount of colonic stool is noted but the bowel is otherwise unremarkable. The bladder is within normal limits. The patient  is status post hysterectomy.  No free fluid, enlarged lymph nodes, biliary dilation or abdominal aortic aneurysm identified.  No acute or suspicious bony abnormalities are identified.  IMPRESSION: No acute or significant abnormalities.   Original Report Authenticated By: Harmon Pier, M.D.      No diagnosis found.  Informed the patient of the results of her Abdominal Ultrasound.  Will order Abdominal CT since cause of the patient's pain and light colored stool was not found on the Abdominal Ultrasound.  8:00 PM Reassessed patient.  She reports that she is feeling much better at this time.  MDM  Patient presenting with RUQ abdominal pain, whitish colored stools, and nausea.  Labs unremarkable.  Abdominal ultrasound was negative.  CT ab/pelvis then ordered, which was also negative.  Pain improved during course in the ED.  Stable for discharge. Patient is a patient of Dr. Marge Duncans with GI.  Instructed patient to follow up with Dr. Bosie Clos.  Return precautions discussed.        Pascal Lux [place], PA-C 03/30/13 1206

## 2013-03-29 NOTE — ED Notes (Signed)
CT notified; contrast has been consumed by pt.

## 2013-03-29 NOTE — ED Notes (Signed)
Pt able to ambulate to the restroom without assistance.  Pt returned to bed and changed into gown, Pharmacy at the bedside.

## 2013-03-30 NOTE — ED Provider Notes (Signed)
Medical screening examination/treatment/procedure(s) were performed by non-physician practitioner and as supervising physician I was immediately available for consultation/collaboration.  Glynn Octave, MD 03/30/13 1213

## 2013-03-31 LAB — URINE CULTURE: Colony Count: >=100000

## 2013-04-01 NOTE — ED Notes (Signed)
+   Urine] Patient treated with Macrobid-sensitive to same-chart appended per protocol MD. 

## 2013-04-17 ENCOUNTER — Encounter (INDEPENDENT_AMBULATORY_CARE_PROVIDER_SITE_OTHER): Payer: Self-pay | Admitting: General Surgery

## 2013-04-17 ENCOUNTER — Ambulatory Visit (INDEPENDENT_AMBULATORY_CARE_PROVIDER_SITE_OTHER): Payer: 59 | Admitting: General Surgery

## 2013-04-17 VITALS — BP 120/80 | HR 72 | Resp 12 | Ht 65.0 in | Wt 171.8 lb

## 2013-04-17 DIAGNOSIS — A630 Anogenital (venereal) warts: Secondary | ICD-10-CM

## 2013-04-17 NOTE — Progress Notes (Signed)
No chief complaint on file.   HISTORY: [name] is a 52 y.o. female who presents to the office with anal irritation.  Other symptoms include itching and burning.  This had been occurring for several months.  She has tried podophyllin treatments in the past with no success.  Nothing makes the symptoms worse.       Past Medical History  Diagnosis Date  . MVP (mitral valve prolapse) HX HEART RACING  YRS AGO    ASYMPTOMATIC SINCE THEN  . Weakness of right leg SECONDARY TO MS  . History of concussion YOUNG ADULT--  NO RESIDUAL  . Headache   . History of edema PERIPHERAL LYMPHEDEMA  . Arthritis   . Bone spur       Past Surgical History  Procedure Laterality Date  . Destruction of anal condyloma  07-07-2005  DR TOTH  . Laparoscopic assisted vaginal hysterectomy  01-15-2002  DR Gerlene Burdock [facility]/ DR TIM DAVIS    AND REPAIR VENTRAL HERNIA  . Cesarean section  X3    BILATERAL TUBAL LIGATION W/ LAST ONE  . Knee arthroscopy with lateral menisectomy  10/26/2012    Procedure: KNEE ARTHROSCOPY WITH LATERAL MENISECTOMY;  Surgeon: Javier Docker, MD;  Location: [facility];  Service: Orthopedics;  Laterality: Left;  Left Knee Arthrsocopy with Partial Lateral Menisectomy        Current Outpatient Prescriptions  Medication Sig Dispense Refill  . ALPRAZolam (XANAX) 0.5 MG tablet Take 0.5 mg by mouth at bedtime as needed for sleep.       Marland Kitchen desvenlafaxine (PRISTIQ) 100 MG 24 hr tablet Take 100 mg by mouth daily.      . Dimethyl Fumarate (TECFIDERA) 240 MG CPDR Take 1 tablet by mouth 2 (two) times daily.       Marland Kitchen gabapentin (NEURONTIN) 600 MG tablet Take 600 mg by mouth 4 (four) times daily as needed (pain).       Marland Kitchen HYDROcodone-acetaminophen (NORCO) 7.5-325 MG per tablet Take 1-2 tablets by mouth every 8 (eight) hours as needed for pain.       Marland Kitchen HYOSCYAMINE PO Place 1-2 tablets under the tongue daily as needed (stomach cramping).      . methylphenidate (RITALIN) 20 MG tablet Take  20-40 mg by mouth 2 (two) times daily. 20 mg in the morning and 40 mg with lunch.      . nitrofurantoin, macrocrystal-monohydrate, (MACROBID) 100 MG capsule Take 1 capsule (100 mg total) by mouth 2 (two) times daily.  10 capsule  0  . nitrofurantoin, macrocrystal-monohydrate, (MACROBID) 100 MG capsule Take 1 capsule (100 mg total) by mouth 2 (two) times daily.  10 capsule  0  . ondansetron (ZOFRAN ODT) 4 MG disintegrating tablet Take 1 tablet (4 mg total) by mouth every 8 (eight) hours as needed for nausea.  20 tablet  0   No current facility-administered medications for this visit.      Allergies  Allergen Reactions  . Ibuprofen Other (See Comments)    CAUSES LYMPHEDEMA  . Nsaids Other (See Comments)    AVOIDS ANY SODIUM CONTAINING MED. -- CAUSES LYMPHEDEMA      Family History  Problem Relation Age of Onset  . Diabetes Mother   . Hypertension Mother   . Bowel Disease Brother   . Diabetes Brother   . Hypertension Brother   . Hyperlipidemia Brother   . Emphysema Father     History   Social History  . Marital Status: Married  Spouse Name: N/A    Number of Children: N/A  . Years of Education: N/A   Social History Main Topics  . Smoking status: Never Smoker   . Smokeless tobacco: Never Used  . Alcohol Use: No  . Drug Use: No  . Sexually Active:    Other Topics Concern  . None   Social History Narrative  . None      REVIEW OF SYSTEMS - PERTINENT POSITIVES ONLY: Review of Systems - General ROS: negative for - chills, fever or weight loss Hematological and Lymphatic ROS: negative for - bleeding problems, blood clots or bruising Respiratory ROS: no cough, shortness of breath, or wheezing Cardiovascular ROS: no chest pain or dyspnea on exertion Gastrointestinal ROS: no abdominal pain, change in bowel habits, or black or bloody stools Genito-Urinary ROS: no dysuria, trouble voiding, or hematuria  EXAM: Filed Vitals:   04/17/13 1532  BP: 120/80  Pulse: 72  Resp:  12    General appearance: alert and cooperative Resp: clear to auscultation bilaterally Cardio: regular rate and rhythm GI: soft, non-tender; bowel sounds normal; no masses,  no organomegaly   Procedure: Anoscopy Surgeon: [name] Diagnosis: anal condyloma  Assistant: [name] After the risks and benefits were explained, verbal consent was obtained for above procedure  Anesthesia: none Findings: post perianal lesion, possible lesion in ant anal canal    ASSESSMENT AND PLAN: 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  She has failed local therapy.  I have recommended HRA and laser ablation.  We discussed the risks of the procedure, which are mainly pain, bleeding and recurrence.  She agrees and wishes to proceed.     [name], MD Colon and Rectal Surgery / General Surgery [facility]      Visit Diagnoses: No diagnosis found.  Primary Care Physician: [name], MD

## 2013-04-17 NOTE — Patient Instructions (Signed)
Anal Warts  What are anal warts? Anal warts (also called "condyloma acuminata") are a condition that affects the area around and inside the anus. They may also affect the skin of the genital area. They first appear as tiny spots or growths, perhaps as small as the head of a pin, and may grow quite large and cover the entire anal area. Usually, they do not cause pain or discomfort to afflicted individuals and patients may be unaware that the warts are present. Some patients will experience symptoms, such as itching, bleeding, mucus discharge and/or a feeling of a lump or mass in the anal area.  What causes anal warts? They are caused by the human papilloma virus (HPV), which is transmitted from person to person by direct contact. HPV is considered a sexually transmitted disease (STD). You do not have to have anal intercourse to develop anal warts. Do anal warts always need to be removed? Yes. If they are not removed, the warts usually grow larger and multiply. Left untreated, the warts may lead to an increased risk of cancer in the affected area. What treatments are available? If warts are very small and are located only on the skin around the anus, they may be treated with a topical medication. They may also be treated by freezing the warts with liquid nitrogen or removed surgically. Surgery typically involves cutting or burning the warts off. While this provides immediate results, it must be performed using either a local anesthetic - such as novocaine - or a general or spinal anesthetic, depending on the number and exact location of warts being treated. It is important that an internal anal examination with an instrument called an anoscope be done by your treating physician to ensure you do not have any inside the anal canal (internal anal warts). Internal anal warts may not be as suitable for treatment by topical medications, and may need to be treated surgically. Additionally, your physician may wish to  examine the entire pelvic region to include the vaginal or penile area to look for other warts that may require treatment. Must I be hospitalized for surgical treatment? Surgical treatment of anal warts is usually performed as outpatient surgery. How much time will I lose from work after surgical treatment? Most people are moderately uncomfortable for a few days after treatment and pain medication may be prescribed. Depending on the extent of the disease, some people return to work the next day, while others may remain out of work for several days to weeks. Will a single treatment cure the problem? When warts are extensive, your surgeon may wish to perform the surgery in stages. Additionally, recurrent warts are common. The virus that causes the warts can live concealed in tissues that appear normal for several months before another wart develops. As new warts develop, they usually can be treated in the physician's office. Sometimes new warts develop so rapidly that office treatment would be quite uncomfortable. In these situations, a second and, occasionally, third outpatient surgical visit may be recommended. How long is treatment usually continued? Follow-up visits are necessary at frequent intervals for several months after all warts appear to be gone, to be certain that no new warts occur. What can be done to avoid getting these warts again? In some cases, warts may recur repeatedly after successful removal, since the virus that causes the warts often persists in a dormant state in body tissues. Discuss with your physician how often you should be evaluated for recurrent warts. Abstain from sexual   contact with individuals who have anal (or genital) warts. Since many individuals may be unaware that they suffer from this condition, sexual abstinence, condom protection or limiting sexual contact to single partner will reduce your potential exposure to the contagious virus that causes these warts. As a  precaution, sexual partners ought to be checked for warts and other sexual transmitted diseases, even if they have no symptoms. What is a colon and rectal surgeon? Colon and rectal surgeons are experts in the surgical and non-surgical treatment of diseases of the colon, rectum and anus. They have completed advanced surgical training in the treatment of these diseases as well as full general surgical training. Board-certified colon and rectal surgeons complete residencies in general surgery and colon and rectal surgery, and pass intensive examinations conducted by the American Board of Surgery and the American Board of Colon and Rectal Surgery. They are well-versed in the treatment of both benign and malignant diseases of the colon, rectum and anus and are able to perform routine screening examinations and surgically treat conditions if indicated to do so. author: Jennifer Lowney, MD, FASCRS, on behalf of the ASCRS Public Relations Committee  2012 American Society of Colon & Rectal Surgeons   

## 2013-04-19 ENCOUNTER — Telehealth (INDEPENDENT_AMBULATORY_CARE_PROVIDER_SITE_OTHER): Payer: Self-pay | Admitting: *Deleted

## 2013-04-19 NOTE — Telephone Encounter (Signed)
I cannot do them at the same time because they are done in different places, but I can do them on back to back days.  If She wants to do a colonoscopy, I would need her records.

## 2013-04-19 NOTE — Telephone Encounter (Signed)
Patient asking if she can have a colonoscopy at the same time as her procedure.  Patient states she is due for it and since she will already be out is it possible?  Patient wanted an answer to this prior to scheduling a date.

## 2013-04-22 NOTE — Telephone Encounter (Signed)
I spoke to the pt.  She has decided to just do the laser ablation for now.  She will get current records sent to Dr [name] and then set up the colonoscopy at a later date.

## 2013-04-23 ENCOUNTER — Encounter (HOSPITAL_BASED_OUTPATIENT_CLINIC_OR_DEPARTMENT_OTHER): Payer: Self-pay | Admitting: *Deleted

## 2013-04-23 NOTE — Progress Notes (Signed)
NPO AFTER MN. ARRIVES AT 0945. NEEDS HG.

## 2013-04-26 ENCOUNTER — Telehealth (INDEPENDENT_AMBULATORY_CARE_PROVIDER_SITE_OTHER): Payer: Self-pay

## 2013-04-26 ENCOUNTER — Encounter (HOSPITAL_BASED_OUTPATIENT_CLINIC_OR_DEPARTMENT_OTHER)
Admission: RE | Disposition: A | Discharge status: Home / Self Care | Payer: Self-pay | Source: Ambulatory Visit | Attending: General Surgery

## 2013-04-26 ENCOUNTER — Ambulatory Visit (HOSPITAL_BASED_OUTPATIENT_CLINIC_OR_DEPARTMENT_OTHER): Payer: 59 | Admitting: Anesthesiology

## 2013-04-26 ENCOUNTER — Ambulatory Visit (HOSPITAL_BASED_OUTPATIENT_CLINIC_OR_DEPARTMENT_OTHER)
Admission: RE | Admit: 2013-04-26 | Discharge: 2013-04-26 | Disposition: A | Discharge status: Home / Self Care | Payer: 59 | Source: Ambulatory Visit | Attending: General Surgery | Admitting: General Surgery

## 2013-04-26 ENCOUNTER — Encounter (HOSPITAL_BASED_OUTPATIENT_CLINIC_OR_DEPARTMENT_OTHER): Payer: Self-pay | Admitting: Anesthesiology

## 2013-04-26 ENCOUNTER — Encounter (HOSPITAL_BASED_OUTPATIENT_CLINIC_OR_DEPARTMENT_OTHER): Payer: Self-pay | Admitting: *Deleted

## 2013-04-26 DIAGNOSIS — G35 Multiple sclerosis: Secondary | ICD-10-CM | POA: Insufficient documentation

## 2013-04-26 DIAGNOSIS — A630 Anogenital (venereal) warts: Secondary | ICD-10-CM

## 2013-04-26 DIAGNOSIS — Z79899 Other long term (current) drug therapy: Secondary | ICD-10-CM | POA: Insufficient documentation

## 2013-04-26 HISTORY — DX: Polyneuropathy, unspecified: G62.9

## 2013-04-26 HISTORY — DX: Multiple sclerosis: G35

## 2013-04-26 HISTORY — PX: LASER ABLATION CONDOLAMATA: SHX5941

## 2013-04-26 LAB — POCT HEMOGLOBIN-HEMACUE: Hemoglobin: 14.1 g/dL (ref 12.0–15.0)

## 2013-04-26 SURGERY — ANOSCOPY, HIGH RESOLUTION
Anesthesia: Monitor Anesthesia Care | Site: Anus | Wound class: Contaminated

## 2013-04-26 MED ORDER — PSYLLIUM 28 % PO PACK: 1.0000 | PACK | Freq: Two times a day (BID) | ORAL | Status: DC

## 2013-04-26 MED ORDER — PROPOFOL 10 MG/ML IV EMUL
INTRAVENOUS | Status: DC | PRN
  Administered 2013-04-26: 50 ug/kg/min via INTRAVENOUS

## 2013-04-26 MED ORDER — SODIUM CHLORIDE 0.9 % IV SOLN
250.0000 mL | INTRAVENOUS | Status: DC | PRN
  Filled 2013-04-26: qty 250

## 2013-04-26 MED ORDER — SODIUM CHLORIDE 0.9 % IJ SOLN
3.0000 mL | Freq: Two times a day (BID) | INTRAMUSCULAR | Status: DC
  Filled 2013-04-26: qty 3

## 2013-04-26 MED ORDER — LACTATED RINGERS IV SOLN
INTRAVENOUS | Status: DC | PRN
  Administered 2013-04-26 (×2): via INTRAVENOUS

## 2013-04-26 MED ORDER — LACTATED RINGERS IV SOLN
INTRAVENOUS | Status: DC
  Administered 2013-04-26: 11:00:00 via INTRAVENOUS
  Filled 2013-04-26: qty 1000

## 2013-04-26 MED ORDER — BUPIVACAINE-EPINEPHRINE 0.25% -1:200000 IJ SOLN
INTRAMUSCULAR | Status: DC | PRN
  Administered 2013-04-26: 25 mL

## 2013-04-26 MED ORDER — ONDANSETRON HCL 4 MG/2ML IJ SOLN
4.0000 mg | Freq: Four times a day (QID) | INTRAMUSCULAR | Status: DC | PRN
  Filled 2013-04-26: qty 2

## 2013-04-26 MED ORDER — OXYCODONE HCL 5 MG PO TABS
5.0000 mg | ORAL_TABLET | ORAL | Status: DC | PRN
  Filled 2013-04-26: qty 2

## 2013-04-26 MED ORDER — FENTANYL CITRATE 0.05 MG/ML IJ SOLN
25.0000 ug | INTRAMUSCULAR | Status: DC | PRN
  Filled 2013-04-26: qty 1

## 2013-04-26 MED ORDER — MEPERIDINE HCL 25 MG/ML IJ SOLN
6.2500 mg | INTRAMUSCULAR | Status: DC | PRN
  Filled 2013-04-26: qty 1

## 2013-04-26 MED ORDER — MIDAZOLAM HCL 5 MG/5ML IJ SOLN
INTRAMUSCULAR | Status: DC | PRN
  Administered 2013-04-26 (×4): 0.5 mg via INTRAVENOUS
  Administered 2013-04-26: 1 mg via INTRAVENOUS
  Administered 2013-04-26 (×2): 0.5 mg via INTRAVENOUS

## 2013-04-26 MED ORDER — ACETIC ACID 5 % SOLN
Status: DC | PRN
  Administered 2013-04-26: 1 via TOPICAL

## 2013-04-26 MED ORDER — PROMETHAZINE HCL 25 MG/ML IJ SOLN
6.2500 mg | INTRAMUSCULAR | Status: DC | PRN
  Filled 2013-04-26: qty 1

## 2013-04-26 MED ORDER — LIDOCAINE 5 % EX OINT
TOPICAL_OINTMENT | CUTANEOUS | Status: DC | PRN
  Administered 2013-04-26: 1

## 2013-04-26 MED ORDER — ONDANSETRON HCL 4 MG/2ML IJ SOLN
INTRAMUSCULAR | Status: DC | PRN
  Administered 2013-04-26: 4 mg via INTRAVENOUS

## 2013-04-26 MED ORDER — DEXAMETHASONE SODIUM PHOSPHATE 4 MG/ML IJ SOLN
INTRAMUSCULAR | Status: DC | PRN
  Administered 2013-04-26: 4 mg via INTRAVENOUS

## 2013-04-26 MED ORDER — ACETAMINOPHEN 650 MG RE SUPP
650.0000 mg | RECTAL | Status: DC | PRN
  Filled 2013-04-26: qty 1

## 2013-04-26 MED ORDER — OXYCODONE HCL 5 MG PO TABS: 5.0000 mg | ORAL_TABLET | Freq: Four times a day (QID) | ORAL | Status: DC | PRN

## 2013-04-26 MED ORDER — LACTATED RINGERS IV SOLN
INTRAVENOUS | Status: DC
  Filled 2013-04-26: qty 1000

## 2013-04-26 MED ORDER — ACETAMINOPHEN 325 MG PO TABS
650.0000 mg | ORAL_TABLET | ORAL | Status: DC | PRN
  Filled 2013-04-26: qty 2

## 2013-04-26 MED ORDER — SODIUM CHLORIDE 0.9 % IJ SOLN
3.0000 mL | INTRAMUSCULAR | Status: DC | PRN
  Filled 2013-04-26: qty 3

## 2013-04-26 MED ORDER — LIDOCAINE HCL (CARDIAC) 20 MG/ML IV SOLN
INTRAVENOUS | Status: DC | PRN
  Administered 2013-04-26: 50 mg via INTRAVENOUS

## 2013-04-26 MED ORDER — DOCUSATE SODIUM 100 MG PO CAPS: 100.0000 mg | ORAL_CAPSULE | Freq: Two times a day (BID) | ORAL | Status: DC

## 2013-04-26 MED ORDER — FENTANYL CITRATE 0.05 MG/ML IJ SOLN
INTRAMUSCULAR | Status: DC | PRN
  Administered 2013-04-26 (×3): 25 ug via INTRAVENOUS
  Administered 2013-04-26 (×2): 12.5 ug via INTRAVENOUS
  Administered 2013-04-26 (×4): 25 ug via INTRAVENOUS

## 2013-04-26 SURGICAL SUPPLY — 42 items
BENZOIN TINCTURE PRP APPL 2/3 (GAUZE/BANDAGES/DRESSINGS) ×4 IMPLANT
BLADE HEX COATED 2.75 (ELECTRODE) ×2 IMPLANT
BLADE SURG 15 STRL LF DISP TIS (BLADE) ×1 IMPLANT
BLADE SURG 15 STRL SS (BLADE) ×1
CANISTER SUCTION 2500CC (MISCELLANEOUS) ×2 IMPLANT
CLOTH BEACON ORANGE TIMEOUT ST (SAFETY) ×2 IMPLANT
COVER MAYO STAND STRL (DRAPES) ×2 IMPLANT
COVER TABLE BACK 60X90 (DRAPES) ×2 IMPLANT
DECANTER SPIKE VIAL GLASS SM (MISCELLANEOUS) ×2 IMPLANT
DRAPE LG THREE QUARTER DISP (DRAPES) ×4 IMPLANT
DRAPE PED LAPAROTOMY (DRAPES) ×2 IMPLANT
DRAPE UNDERBUTTOCKS STRL (DRAPE) IMPLANT
DRSG PAD ABDOMINAL 8X10 ST (GAUZE/BANDAGES/DRESSINGS) ×2 IMPLANT
ELECT BLADE 6.5 .24CM SHAFT (ELECTRODE) IMPLANT
ELECT REM PT RETURN 9FT ADLT (ELECTROSURGICAL) ×2
ELECTRODE REM PT RTRN 9FT ADLT (ELECTROSURGICAL) ×1 IMPLANT
GAUZE SPONGE 4X4 12PLY STRL LF (GAUZE/BANDAGES/DRESSINGS) ×2 IMPLANT
GAUZE SPONGE 4X4 16PLY XRAY LF (GAUZE/BANDAGES/DRESSINGS) IMPLANT
GAUZE VASELINE 3X9 (GAUZE/BANDAGES/DRESSINGS) IMPLANT
GLOVE BIO SURGEON STRL SZ 6.5 (GLOVE) ×6 IMPLANT
GLOVE BIOGEL PI IND STRL 7.0 (GLOVE) ×2 IMPLANT
GLOVE BIOGEL PI INDICATOR 7.0 (GLOVE) ×2
GLOVE INDICATOR 7.0 STRL GRN (GLOVE) ×2 IMPLANT
NEEDLE HYPO 25X1 1.5 SAFETY (NEEDLE) ×2 IMPLANT
NS IRRIG 500ML POUR BTL (IV SOLUTION) ×2 IMPLANT
PACK BASIN DAY SURGERY FS (CUSTOM PROCEDURE TRAY) ×2 IMPLANT
PENCIL BUTTON HOLSTER BLD 10FT (ELECTRODE) ×2 IMPLANT
SPONGE GAUZE 4X4 12PLY (GAUZE/BANDAGES/DRESSINGS) IMPLANT
SPONGE SURGIFOAM ABS GEL 12-7 (HEMOSTASIS) IMPLANT
SUT CHROMIC 2 0 SH (SUTURE) IMPLANT
SUT CHROMIC 3 0 SH 27 (SUTURE) ×2 IMPLANT
SUT MON AB 3-0 SH 27 (SUTURE)
SUT MON AB 3-0 SH27 (SUTURE) IMPLANT
SUT VIC AB 4-0 P-3 18XBRD (SUTURE) IMPLANT
SUT VIC AB 4-0 P3 18 (SUTURE)
SYR BULB IRRIGATION 50ML (SYRINGE) ×2 IMPLANT
SYR CONTROL 10ML LL (SYRINGE) ×2 IMPLANT
TOWEL OR 17X24 6PK STRL BLUE (TOWEL DISPOSABLE) ×2 IMPLANT
TOWEL OR 17X26 10 PK STRL BLUE (TOWEL DISPOSABLE) ×2 IMPLANT
TRAY DSU PREP LF (CUSTOM PROCEDURE TRAY) ×2 IMPLANT
TUBE CONNECTING 12X1/4 (SUCTIONS) ×2 IMPLANT
YANKAUER SUCT BULB TIP NO VENT (SUCTIONS) IMPLANT

## 2013-04-26 NOTE — Telephone Encounter (Signed)
Patient called to state that she takes the Norco 7.5/325 for her MS. She is asking if she should continue that or the Oxy IR that was given to her by Dr [name]. I advised she can continue the Norco since she takes this already if it helps with the pain from surgery. She will do this and fill the Oxy IR if needed. She will call back if needed.

## 2013-04-26 NOTE — Anesthesia Procedure Notes (Signed)
Procedure Name: MAC Date/Time: 04/26/2013 11:40 AM Performed by: Jessica Priest Pre-anesthesia Checklist: Suction available, Emergency Drugs available, Patient identified, Timeout performed and Patient being monitored Oxygen Delivery Method: Simple face mask Preoxygenation: Pre-oxygenation with 100% oxygen Intubation Type: IV induction

## 2013-04-26 NOTE — Anesthesia Preprocedure Evaluation (Signed)
Anesthesia Evaluation  Patient identified by MRN, date of birth, ID band Patient awake    Reviewed: Allergy & Precautions, H&P , NPO status , Patient's Chart, lab work & pertinent test results  Airway Mallampati: III TM Distance: >3 FB Neck ROM: Full    Dental  (+) Dental Advisory Given and Teeth Intact   Pulmonary neg pulmonary ROS,  breath sounds clear to auscultation  Pulmonary exam normal       Cardiovascular - CAD and - Past MI + Valvular Problems/Murmurs MVP Rhythm:Regular Rate:Normal     Neuro/Psych  Headaches, Multiple sclerosis  Neuromuscular disease negative psych ROS   GI/Hepatic negative GI ROS, Neg liver ROS,   Endo/Other  negative endocrine ROS  Renal/GU negative Renal ROS     Musculoskeletal negative musculoskeletal ROS (+)   Abdominal   Peds  Hematology negative hematology ROS (+)   Anesthesia Other Findings   Reproductive/Obstetrics                           Anesthesia Physical  Anesthesia Plan  ASA: III  Anesthesia Plan: MAC   Post-op Pain Management:    Induction:   Airway Management Planned:   Additional Equipment:   Intra-op Plan:   Post-operative Plan:   Informed Consent: I have reviewed the patients History and Physical, chart, labs and discussed the procedure including the risks, benefits and alternatives for the proposed anesthesia with the patient or authorized representative who has indicated his/her understanding and acceptance.   Dental advisory given  Plan Discussed with: CRNA  Anesthesia Plan Comments: (Normothermia, avoiding hyperthermia)        Anesthesia Quick Evaluation

## 2013-04-26 NOTE — Anesthesia Postprocedure Evaluation (Signed)
  Anesthesia Post-op Note  Patient: [name]  Procedure(s) Performed: Procedure(s) (LRB): HIGH RESOLUTION ANOSCOPY (N/A) LASER ABLATION CONDOLAMATA (N/A)  Patient Location: PACU  Anesthesia Type: MAC  Level of Consciousness: awake and alert   Airway and Oxygen Therapy: Patient Spontanous Breathing  Post-op Pain: mild  Post-op Assessment: Post-op Vital signs reviewed, Patient's Cardiovascular Status Stable, Respiratory Function Stable, Patent Airway and No signs of Nausea or vomiting  Last Vitals:  Filed Vitals:   04/26/13 1230  BP: 125/81  Pulse: 66  Temp:   Resp: 9    Post-op Vital Signs: stable   Complications: No apparent anesthesia complications

## 2013-04-26 NOTE — Interval H&P Note (Signed)
History and Physical Interval Note:  04/26/2013 10:58 AM  [name]  has presented today for surgery, with the diagnosis of anal awarts condyloma  The various methods of treatment have been discussed with the patient and family. After consideration of risks, benefits and other options for treatment, the patient has consented to  Procedure(s): HIGH RESOLUTION ANOSCOPY (N/A) LASER ABLATION CONDOLAMATA (N/A) as a surgical intervention .  The patient's history has been reviewed, patient examined, no change in status, stable for surgery.  I have reviewed the patient's chart and labs.  Questions were answered to the patient's satisfaction.     [name], MD  Colorectal and General Surgery [facility]

## 2013-04-26 NOTE — Transfer of Care (Signed)
Immediate Anesthesia Transfer of Care Note  Patient: [name]  Procedure(s) Performed: Procedure(s) (LRB): HIGH RESOLUTION ANOSCOPY (N/A) LASER ABLATION CONDOLAMATA (N/A)  Patient Location: PACU  Anesthesia Type: General  Level of Consciousness: awake, sedated, patient cooperative and responds to stimulation  Airway & Oxygen Therapy: Patient Spontanous Breathing and Patient on RA SaO2 100 %  Post-op Assessment: Report given to PACU RN, Post -op Vital signs reviewed and stable and Patient moving all extremities  Post vital signs: Reviewed and stable  Complications: No apparent anesthesia complications

## 2013-04-26 NOTE — H&P (View-Only) (Signed)
No chief complaint on file.   HISTORY: [name] is a 52 y.o. female who presents to the office with anal irritation.  Other symptoms include itching and burning.  This had been occurring for several months.  She has tried podophyllin treatments in the past with no success.  Nothing makes the symptoms worse.       Past Medical History  Diagnosis Date  . MVP (mitral valve prolapse) HX HEART RACING  YRS AGO    ASYMPTOMATIC SINCE THEN  . Weakness of right leg SECONDARY TO MS  . History of concussion YOUNG ADULT--  NO RESIDUAL  . Headache   . History of edema PERIPHERAL LYMPHEDEMA  . Arthritis   . Bone spur       Past Surgical History  Procedure Laterality Date  . Destruction of anal condyloma  07-07-2005  DR TOTH  . Laparoscopic assisted vaginal hysterectomy  01-15-2002  DR RICHARD HOLLAND/ DR TIM DAVIS    AND REPAIR VENTRAL HERNIA  . Cesarean section  X3    BILATERAL TUBAL LIGATION W/ LAST ONE  . Knee arthroscopy with lateral menisectomy  10/26/2012    Procedure: KNEE ARTHROSCOPY WITH LATERAL MENISECTOMY;  Surgeon: Jeffrey C Beane, MD;  Location: [facility];  Service: Orthopedics;  Laterality: Left;  Left Knee Arthrsocopy with Partial Lateral Menisectomy        Current Outpatient Prescriptions  Medication Sig Dispense Refill  . ALPRAZolam (XANAX) 0.5 MG tablet Take 0.5 mg by mouth at bedtime as needed for sleep.       . desvenlafaxine (PRISTIQ) 100 MG 24 hr tablet Take 100 mg by mouth daily.      . Dimethyl Fumarate (TECFIDERA) 240 MG CPDR Take 1 tablet by mouth 2 (two) times daily.       . gabapentin (NEURONTIN) 600 MG tablet Take 600 mg by mouth 4 (four) times daily as needed (pain).       . HYDROcodone-acetaminophen (NORCO) 7.5-325 MG per tablet Take 1-2 tablets by mouth every 8 (eight) hours as needed for pain.       . HYOSCYAMINE PO Place 1-2 tablets under the tongue daily as needed (stomach cramping).      . methylphenidate (RITALIN) 20 MG tablet Take  20-40 mg by mouth 2 (two) times daily. 20 mg in the morning and 40 mg with lunch.      . nitrofurantoin, macrocrystal-monohydrate, (MACROBID) 100 MG capsule Take 1 capsule (100 mg total) by mouth 2 (two) times daily.  10 capsule  0  . nitrofurantoin, macrocrystal-monohydrate, (MACROBID) 100 MG capsule Take 1 capsule (100 mg total) by mouth 2 (two) times daily.  10 capsule  0  . ondansetron (ZOFRAN ODT) 4 MG disintegrating tablet Take 1 tablet (4 mg total) by mouth every 8 (eight) hours as needed for nausea.  20 tablet  0   No current facility-administered medications for this visit.      Allergies  Allergen Reactions  . Ibuprofen Other (See Comments)    CAUSES LYMPHEDEMA  . Nsaids Other (See Comments)    AVOIDS ANY SODIUM CONTAINING MED. -- CAUSES LYMPHEDEMA      Family History  Problem Relation Age of Onset  . Diabetes Mother   . Hypertension Mother   . Bowel Disease Brother   . Diabetes Brother   . Hypertension Brother   . Hyperlipidemia Brother   . Emphysema Father     History   Social History  . Marital Status: Married      Spouse Name: N/A    Number of Children: N/A  . Years of Education: N/A   Social History Main Topics  . Smoking status: Never Smoker   . Smokeless tobacco: Never Used  . Alcohol Use: No  . Drug Use: No  . Sexually Active:    Other Topics Concern  . None   Social History Narrative  . None      REVIEW OF SYSTEMS - PERTINENT POSITIVES ONLY: Review of Systems - General ROS: negative for - chills, fever or weight loss Hematological and Lymphatic ROS: negative for - bleeding problems, blood clots or bruising Respiratory ROS: no cough, shortness of breath, or wheezing Cardiovascular ROS: no chest pain or dyspnea on exertion Gastrointestinal ROS: no abdominal pain, change in bowel habits, or black or bloody stools Genito-Urinary ROS: no dysuria, trouble voiding, or hematuria  EXAM: Filed Vitals:   04/17/13 1532  BP: 120/80  Pulse: 72  Resp:  12    General appearance: alert and cooperative Resp: clear to auscultation bilaterally Cardio: regular rate and rhythm GI: soft, non-tender; bowel sounds normal; no masses,  no organomegaly   Procedure: Anoscopy Surgeon: Chauntelle Azpeitia Diagnosis: anal condyloma  Assistant: [name] After the risks and benefits were explained, verbal consent was obtained for above procedure  Anesthesia: none Findings: post perianal lesion, possible lesion in ant anal canal    ASSESSMENT AND PLAN: 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  She has failed local therapy.  I have recommended HRA and laser ablation.  We discussed the risks of the procedure, which are mainly pain, bleeding and recurrence.  She agrees and wishes to proceed.     [name], MD Colon and Rectal Surgery / General Surgery [facility]      Visit Diagnoses: No diagnosis found.  Primary Care Physician: [name],[name], MD  

## 2013-04-26 NOTE — Telephone Encounter (Signed)
[facility] pharmaceutist Ronaldo Miyamoto wqs informed patient is complete her recent Rx for norco 7.5/325mg  before filling rx oxycodone 5mg  per orders

## 2013-04-26 NOTE — Op Note (Signed)
04/26/2013  12:06 PM  PATIENT:  [name]  52 y.o. female  Patient Care Team: [name], MD as PCP - General (Internal Medicine)  PRE-OPERATIVE DIAGNOSIS:  Recurrent anal condyloma  POST-OPERATIVE DIAGNOSIS:  same  PROCEDURE:   HIGH RESOLUTION ANOSCOPY LASER ABLATION CONDYLOMA SURGEON:  Surgeon(s): Romie Levee, MD  ASSISTANT: none   ANESTHESIA:   local and IV sedation  EBL: MIN  Total I/O In: 600 [I.V.:600] Out: -   Delay start of Pharmacological VTE agent (>24hrs) due to surgical blood loss or risk of bleeding:  no  DRAINS: none   SPECIMEN:  Source of Specimen:  L post anal canal  DISPOSITION OF SPECIMEN:  PATHOLOGY  COUNTS:  YES  PLAN OF CARE: Discharge to home after PACU  PATIENT DISPOSITION:  PACU - hemodynamically stable.  INDICATION: This is a 52yo F with recurrent anal condyloma  OR FINDINGS: 2 anterior anal canal condyloma, one suspicious area in L post anal canal with HRA, Post perianal condyloma  DESCRIPTION: The patient was identified in the preoperative holding area and taken to the OR where they were laid prone on the operating room table. MAC anesthesia was smoothly induced.  The patient was then prepped and draped in the usual sterile fashion. A surgical timeout was performed indicating the correct patient, procedure, positioning and preoperative antibioitics. SCDs were noted to be in place and functioning prior to the operation.   After this was completed, a sponge was soaked in 5% acetic acid was placed over the perianal region. This was allowed to soak for 2 minutes. The sponge was removed and the perianal region was evaluated.  There was one area that looked suspicious in the posterior anal canal near the anal verge.  This was biopsied.  The internal anal canal was evaluated via anoscopy with a Hill-Ferguson anoscope.  There were no other suspicious lesions noted.  After this was completed, hemostasis was achieved with electrocautery and  the biopsy sites were closed using a 3-0 chromic suture.  Next the laser was brought onto the field and covered with a sterile drape.  The edges of the operative field were draped with wet towels.  Appropriate ventilation was obtained.  All staff were protected with small particle masks and goggles safe for the laser.  The laser was then activated. All condylomatous lesions were ablated. Hemostasis was then achieved using electrocautery. A thin layer of lidocaine ointment was then placed over the lesions. A sterile dressing was applied over this. The patient was then awakened from anesthesia and sent to the postanesthesia care unit in stable condition. All counts were correct operating room staff. I was present and personally performed the entire procedure.

## 2013-04-30 ENCOUNTER — Telehealth (INDEPENDENT_AMBULATORY_CARE_PROVIDER_SITE_OTHER): Payer: Self-pay

## 2013-04-30 NOTE — Telephone Encounter (Signed)
Patient states she was not able to ask about her surgery last week because her son was there and she did not want him to know. Patient is asking for Dr. [name] to call her

## 2013-04-30 NOTE — Telephone Encounter (Signed)
Discussed surgery and path results with patient.  I will see her soon for follow up.

## 2013-05-01 ENCOUNTER — Encounter (HOSPITAL_BASED_OUTPATIENT_CLINIC_OR_DEPARTMENT_OTHER): Payer: Self-pay | Admitting: General Surgery

## 2013-10-10 ENCOUNTER — Other Ambulatory Visit: Payer: Self-pay

## 2014-06-17 ENCOUNTER — Encounter (HOSPITAL_COMMUNITY): Payer: Self-pay | Admitting: Emergency Medicine

## 2014-06-17 ENCOUNTER — Emergency Department (HOSPITAL_COMMUNITY): Payer: 59

## 2014-06-17 ENCOUNTER — Emergency Department (HOSPITAL_COMMUNITY)
Admission: EM | Admit: 2014-06-17 | Discharge: 2014-06-17 | Disposition: A | Discharge status: Home / Self Care | Payer: 59 | Attending: Emergency Medicine | Admitting: Emergency Medicine

## 2014-06-17 DIAGNOSIS — M129 Arthropathy, unspecified: Secondary | ICD-10-CM | POA: Insufficient documentation

## 2014-06-17 DIAGNOSIS — Z8679 Personal history of other diseases of the circulatory system: Secondary | ICD-10-CM | POA: Insufficient documentation

## 2014-06-17 DIAGNOSIS — Z8669 Personal history of other diseases of the nervous system and sense organs: Secondary | ICD-10-CM | POA: Insufficient documentation

## 2014-06-17 DIAGNOSIS — Z87898 Personal history of other specified conditions: Secondary | ICD-10-CM | POA: Insufficient documentation

## 2014-06-17 DIAGNOSIS — R000 Tachycardia, unspecified: Secondary | ICD-10-CM

## 2014-06-17 DIAGNOSIS — Z79899 Other long term (current) drug therapy: Secondary | ICD-10-CM | POA: Insufficient documentation

## 2014-06-17 DIAGNOSIS — R0602 Shortness of breath: Secondary | ICD-10-CM | POA: Insufficient documentation

## 2014-06-17 DIAGNOSIS — Z87820 Personal history of traumatic brain injury: Secondary | ICD-10-CM | POA: Insufficient documentation

## 2014-06-17 LAB — I-STAT TROPONIN, ED: Troponin i, poc: 0 ng/mL (ref 0.00–0.08)

## 2014-06-17 LAB — URINALYSIS, ROUTINE W REFLEX MICROSCOPIC
Bilirubin Urine: NEGATIVE
Glucose, UA: NEGATIVE mg/dL
Hgb urine dipstick: NEGATIVE
Ketones, ur: NEGATIVE mg/dL
Leukocytes, UA: NEGATIVE
Nitrite: NEGATIVE
Protein, ur: NEGATIVE mg/dL
Specific Gravity, Urine: 1.014 (ref 1.005–1.030)
Urobilinogen, UA: 0.2 mg/dL (ref 0.0–1.0)
pH: 7.5 (ref 5.0–8.0)

## 2014-06-17 LAB — BASIC METABOLIC PANEL
Anion gap: 12 (ref 5–15)
BUN: 20 mg/dL (ref 6–23)
CO2: 29 mEq/L (ref 19–32)
Calcium: 9.4 mg/dL (ref 8.4–10.5)
Chloride: 98 mEq/L (ref 96–112)
Creatinine, Ser: 0.94 mg/dL (ref 0.50–1.10)
GFR calc Af Amer: 78 mL/min — ABNORMAL LOW (ref >90)
GFR calc non Af Amer: 67 mL/min — ABNORMAL LOW (ref >90)
Glucose, Bld: 87 mg/dL (ref 70–99)
Potassium: 4.6 mEq/L (ref 3.7–5.3)
Sodium: 139 mEq/L (ref 137–147)

## 2014-06-17 LAB — CBC
HCT: 44.7 % (ref 36.0–46.0)
Hemoglobin: 15.1 g/dL — ABNORMAL HIGH (ref 12.0–15.0)
MCH: 30.3 pg (ref 26.0–34.0)
MCHC: 33.8 g/dL (ref 30.0–36.0)
MCV: 89.8 fL (ref 78.0–100.0)
Platelets: 257 10*3/uL (ref 150–400)
RBC: 4.98 MIL/uL (ref 3.87–5.11)
RDW: 13.3 % (ref 11.5–15.5)
WBC: 3.9 10*3/uL — ABNORMAL LOW (ref 4.0–10.5)

## 2014-06-17 MED ORDER — SODIUM CHLORIDE 0.9 % IV BOLUS (SEPSIS)
1000.0000 mL | Freq: Once | INTRAVENOUS | Status: AC
  Administered 2014-06-17: 1000 mL via INTRAVENOUS

## 2014-06-17 MED ORDER — MORPHINE SULFATE 4 MG/ML IJ SOLN
4.0000 mg | Freq: Once | INTRAMUSCULAR | Status: AC
  Administered 2014-06-17: 4 mg via INTRAVENOUS
  Filled 2014-06-17: qty 1

## 2014-06-17 MED ORDER — METOPROLOL TARTRATE 50 MG PO TABS: 25.0000 mg | ORAL_TABLET | Freq: Two times a day (BID) | ORAL | Status: DC | PRN

## 2014-06-17 MED ORDER — NITROGLYCERIN 0.4 MG SL SUBL
0.4000 mg | SUBLINGUAL_TABLET | SUBLINGUAL | Status: DC | PRN
  Administered 2014-06-17: 0.4 mg via SUBLINGUAL
  Filled 2014-06-17: qty 1

## 2014-06-17 NOTE — ED Notes (Signed)
Pt in via EMS from an urgent care, went there due to chest pain, while there had an EKG that showed AFib RVR, history of same, given 324 of ASA there and nitro SL spray while there, upon EMS arrival pt had converted to NSR, states pain has improved, rates 1/10 on arrival, pt used to take metoprolol for this but hasn't needed it in a few years.

## 2014-06-17 NOTE — ED Provider Notes (Signed)
CSN: [id number]     Arrival date & time 06/17/14  1820 History   First MD Initiated Contact with Patient 06/17/14 1833     Chief Complaint  Patient presents with  . Chest Pain     (Consider location/radiation/quality/duration/timing/severity/associated sxs/prior Treatment) HPI 53 y/o female with PMH of MVP and past episodes of A fib with RVR, currently not undergoing treatment that presents with tachycardia from urgent care with pressure like, sudden onset chest pain with associated SOB, onset 1pm. Patient has since felt the rapid heart beat discontinue and feels 1/10 chest pain on arrival to the ED. EKG from urgent care shows SVT rhythm.   Past Medical History  Diagnosis Date  . MVP (mitral valve prolapse) HX HEART RACING  YRS AGO    ASYMPTOMATIC SINCE THEN  . Weakness of right leg SECONDARY TO MS  . History of concussion YOUNG ADULT--  NO RESIDUAL  . Headache(784.0)   . History of edema PERIPHERAL LYMPHEDEMA  . Arthritis   . Bone spur   . MS (multiple sclerosis)   . 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 Destruction of anal condyloma  07-07-2005  DR TOTH  . Laparoscopic assisted vaginal hysterectomy  01-15-2002  DR Gerlene Burdock HOLLAND/ DR TIM DAVIS    AND REPAIR VENTRAL HERNIA  . Cesarean section  X3    BILATERAL TUBAL LIGATION W/ LAST ONE  . Knee arthroscopy with lateral menisectomy  10/26/2012    Procedure: KNEE ARTHROSCOPY WITH LATERAL MENISECTOMY;  Surgeon: Javier Docker, MD;  Location: [facility];  Service: Orthopedics;  Laterality: Left;  Left Knee Arthrsocopy with Partial Lateral Menisectomy  . Laser ablation condolamata N/A 04/26/2013    Procedure: LASER ABLATION CONDOLAMATA;  Surgeon: Romie Levee, MD;  Location: [facility];  Service: General;  Laterality: N/A;   Family History  Problem Relation Age of Onset  . Diabetes Mother   . Hypertension Mother   . Bowel Disease Brother    . Diabetes Brother   . Hypertension Brother   . Hyperlipidemia Brother   . Emphysema Father    History  Substance Use Topics  . Smoking status: Never Smoker   . Smokeless tobacco: Never Used  . Alcohol Use: No   OB History   Grav Para Term Preterm Abortions TAB SAB Ect Mult Living                 Review of Systems  Constitutional: Negative for activity change.  HENT: Negative for congestion.   Respiratory: Positive for shortness of breath. Negative for cough.   Cardiovascular: Positive for chest pain and palpitations. Negative for leg swelling.  Gastrointestinal: Negative for nausea, vomiting, abdominal pain, diarrhea, constipation, blood in stool and abdominal distention.  Genitourinary: Negative for dysuria, flank pain and vaginal discharge.  Musculoskeletal: Negative for back pain.  Skin: Negative for color change.  Neurological: Negative for syncope and headaches.  Psychiatric/Behavioral: Negative for agitation.      Allergies  Ibuprofen and Nsaids  Home Medications   Prior to Admission medications   Medication Sig Start Date End Date Taking? Authorizing Provider  ALPRAZolam Prudy Feeler) 0.5 MG tablet Take 0.5 mg by mouth at bedtime as needed for sleep.     Historical Provider, MD  desvenlafaxine (PRISTIQ) 100 MG 24 hr tablet Take 100 mg by mouth daily.    Historical Provider, MD  Dimethyl Fumarate (TECFIDERA) 240 MG CPDR Take 1  tablet by mouth 2 (two) times daily.     Historical Provider, MD  docusate sodium (COLACE) 100 MG capsule Take 1 capsule (100 mg total) by mouth 2 (two) times daily. 04/26/13   Romie Levee, MD  gabapentin (NEURONTIN) 600 MG tablet Take 600 mg by mouth 4 (four) times daily as needed (pain).     Historical Provider, MD  HYDROcodone-acetaminophen (NORCO) 7.5-325 MG per tablet Take 1-2 tablets by mouth every 8 (eight) hours as needed for pain.     Historical Provider, MD  HYOSCYAMINE PO Place 1-2 tablets under the tongue daily as needed (stomach  cramping).    Historical Provider, MD  methylphenidate (RITALIN) 20 MG tablet Take 20-40 mg by mouth 2 (two) times daily. 20 mg in the morning and 40 mg with lunch.    Historical Provider, MD  oxyCODONE (OXY IR/ROXICODONE) 5 MG immediate release tablet Take 1-2 tablets (5-10 mg total) by mouth every 6 (six) hours as needed for pain. 04/26/13   Romie Levee, MD  psyllium (METAMUCIL SMOOTH TEXTURE) 28 % packet Take 1 packet by mouth 2 (two) times daily. 04/26/13   Romie Levee, MD   BP 133/80  Pulse 73  Temp(Src) 97.5 F (36.4 C) (Oral)  Resp 15  Wt 169 lb (76.658 kg)  SpO2 99% Physical Exam  Constitutional: She is oriented to person, place, and time. She appears well-developed.  HENT:  Head: Normocephalic.  Eyes: Pupils are equal, round, and reactive to light.  Neck: Neck supple.  Cardiovascular: Normal rate.  Exam reveals no gallop and no friction rub.   No murmur heard. Systolic click  No pain with chest palpation   Pulmonary/Chest: Effort normal and breath sounds normal. No respiratory distress.  Abdominal: Soft. She exhibits no distension. There is no tenderness. There is no rebound.  Musculoskeletal: She exhibits no edema.  Neurological: She is alert and oriented to person, place, and time.  Skin: Skin is warm.  Psychiatric: She has a normal mood and affect.    ED Course  Procedures (including critical care time) Labs Review Labs Reviewed  CBC - Abnormal; Notable for the following:    WBC 3.9 (*)    Hemoglobin 15.1 (*)    All other components within normal limits  BASIC METABOLIC PANEL - Abnormal; Notable for the following:    GFR calc non Af Amer 67 (*)    GFR calc Af Amer 78 (*)    All other components within normal limits  URINALYSIS, ROUTINE W REFLEX MICROSCOPIC  I-STAT TROPOININ, ED    Imaging Review Dg Chest 2 View  06/17/2014   CLINICAL DATA:  Shortness of breath and chest pain  EXAM: CHEST  2 VIEW  COMPARISON:  October 07, 2010  FINDINGS: There is no edema  or consolidation. The heart size and pulmonary vascularity are normal. No adenopathy. No pneumothorax. No bone lesions.  IMPRESSION: No edema or consolidation.   Electronically Signed   By: Bretta Bang M.D.   On: 06/17/2014 19:16     EKG Interpretation   Date/Time:  Tuesday June 17 2014 18:25:10 EDT Ventricular Rate:  75 PR Interval:  137 QRS Duration: 85 QT Interval:  389 QTC Calculation: 434 R Axis:   86 Text Interpretation:  Sinus rhythm Consider left ventricular hypertrophy  Minimal ST elevation, inferior leads Confirmed by Rhunette Croft, MD, Janey Genta  [phone]) on 06/17/2014 7:57:01 PM      MDM   Final diagnoses:  Tachycardia   53 y/o female with PMH MVP that  presents with from urgent care for concern of A fib with RVR with chest pain that has since decreased to 1/10. On arrival patient was in normal sinus rhythm on EKG. Trop- Neg, CBC and BMP unremarkable, CXR- no edema or consolidations.  While in the ED the patient re-entered a tachy rhythm, patient noted to have regularly irregular rhythm. Determined to be SVT vs A-fib.With worsening of chest pain.  The patient was treated with NS bolus and monitored in the department with out reoccurrence and chest pain resolved. EKG remained non-ischemic.  Patient discharged with PO metoprolol PRN prescribed for palpitations and instructions for close cardiology follow-up.     Clement SayresStaci Sachin Ferencz, MD 06/19/14 [phone]

## 2014-06-17 NOTE — ED Notes (Signed)
Patient given food

## 2014-06-17 NOTE — ED Notes (Addendum)
Patient placed on [place] 2L for chest pain and shortness of breath. EDP and resident made aware of patients unchanged chest pressure.

## 2014-06-17 NOTE — Discharge Instructions (Signed)
Nonspecific Tachycardia  Tachycardia is a faster than normal heartbeat (more than 100 beats per minute). In adults, the heart normally beats between 60 and 100 times a minute. A fast heartbeat may be a normal response to exercise or stress. It does not necessarily mean that something is wrong. However, sometimes when your heart beats too fast it may not be able to pump enough blood to the rest of your body. This can result in chest pain, shortness of breath, dizziness, and even fainting. Nonspecific tachycardia means that the specific cause or pattern of your tachycardia is unknown.  CAUSES   Tachycardia may be harmless or it may be due to a more serious underlying cause. Possible causes of tachycardia include:  · Exercise or exertion.  · Fever.  · Pain or injury.  · Infection.  · Loss of body fluids (dehydration).  · Overactive thyroid.  · Lack of red blood cells (anemia).  · Anxiety and stress.  · Alcohol.  · Caffeine.  · Tobacco products.  · Diet pills.  · Illegal drugs.  · Heart disease.  SYMPTOMS  · Rapid or irregular heartbeat (palpitations).  · Suddenly feeling your heart beating (cardiac awareness).  · Dizziness.  · Tiredness (fatigue).  · Shortness of breath.  · Chest pain.  · Nausea.  · Fainting.  DIAGNOSIS   Your caregiver will perform a physical exam and take your medical history. In some cases, a heart specialist (cardiologist) may be consulted. Your caregiver may also order:  · Blood tests.  · Electrocardiography. This test records the electrical activity of your heart.  · A heart monitoring test.  TREATMENT   Treatment will depend on the likely cause of your tachycardia. The goal is to treat the underlying cause of your tachycardia. Treatment methods may include:  · Replacement of fluids or blood through an intravenous (IV) tube for moderate to severe dehydration or anemia.  · New medicines or changes in your current medicines.  · Diet and lifestyle changes.  · Treatment for certain  infections.  · Stress relief or relaxation methods.  HOME CARE INSTRUCTIONS   · Rest.  · Drink enough fluids to keep your urine clear or pale yellow.  · Do not smoke.  · Avoid:  ¨ Caffeine.  ¨ Tobacco.  ¨ Alcohol.  ¨ Chocolate.  ¨ Stimulants such as over-the-counter diet pills or pills that help you stay awake.  ¨ Situations that cause anxiety or stress.  ¨ Illegal drugs such as marijuana, phencyclidine (PCP), and cocaine.  · Only take medicine as directed by your caregiver.  · Keep all follow-up appointments as directed by your caregiver.  SEEK IMMEDIATE MEDICAL CARE IF:   · You have pain in your chest, upper arms, jaw, or neck.  · You become weak, dizzy, or feel faint.  · You have palpitations that will not go away.  · You vomit, have diarrhea, or pass blood in your stool.  · Your skin is cool, pale, and wet.  · You have a fever that will not go away with rest, fluids, and medicine.  MAKE SURE YOU:   · Understand these instructions.  · Will watch your condition.  · Will get help right away if you are not doing well or get worse.  Document Released: 12/29/2004 Document Revised: 02/13/2012 Document Reviewed: 11/01/2011  [employer]® Patient Information ©2015 [employer]. This information is not intended to replace advice given to you by your health care provider. Make sure you discuss any questions   you have with your health care provider.

## 2014-06-17 NOTE — ED Notes (Signed)
Upon arrival patient was having chest pressure. Resident at bedside.

## 2014-06-25 NOTE — ED Provider Notes (Signed)
I saw and evaluated the patient, reviewed the resident's note and I agree with the findings and plan.   EKG Interpretation   Date/Time:  Tuesday June 17 2014 20:38:15 EDT Ventricular Rate:  63 PR Interval:  113 QRS Duration: 90 QT Interval:  416 QTC Calculation: 426 R Axis:   89 Text Interpretation:  Sinus rhythm Borderline short PR interval Consider  left ventricular hypertrophy ED PHYSICIAN INTERPRETATION AVAILABLE IN [facility] Confirmed by TEST, Record ([id number]) on 06/19/2014 7:40:36 AM       Pt comes in with cc of afib with RVR and chest pain. EKG however appears more to be regularly irregular rhythm - and probably some sore of SVT.  Doesn't appear to be afib/flutter. Chest pain resolved, and HR improved with fluids. Will start on metop. Return precautions discussed.   Derwood Kaplan, MD 06/25/14 [phone]

## 2014-10-29 ENCOUNTER — Other Ambulatory Visit: Payer: Self-pay | Admitting: Obstetrics and Gynecology

## 2014-11-03 LAB — CYTOLOGY - PAP

## 2014-11-14 ENCOUNTER — Ambulatory Visit: Payer: Self-pay | Admitting: Internal Medicine

## 2014-12-01 ENCOUNTER — Encounter: Payer: Self-pay | Admitting: Internal Medicine

## 2014-12-22 ENCOUNTER — Ambulatory Visit (INDEPENDENT_AMBULATORY_CARE_PROVIDER_SITE_OTHER): Payer: 59 | Admitting: Neurology

## 2014-12-22 ENCOUNTER — Encounter: Payer: Self-pay | Admitting: Neurology

## 2014-12-22 VITALS — BP 154/90 | HR 68 | Resp 14 | Ht 65.0 in | Wt 167.8 lb

## 2014-12-22 DIAGNOSIS — R269 Unspecified abnormalities of gait and mobility: Secondary | ICD-10-CM

## 2014-12-22 DIAGNOSIS — F909 Attention-deficit hyperactivity disorder, unspecified type: Secondary | ICD-10-CM

## 2014-12-22 DIAGNOSIS — F988 Other specified behavioral and emotional disorders with onset usually occurring in childhood and adolescence: Secondary | ICD-10-CM | POA: Insufficient documentation

## 2014-12-22 DIAGNOSIS — R3911 Hesitancy of micturition: Secondary | ICD-10-CM

## 2014-12-22 DIAGNOSIS — F4323 Adjustment disorder with mixed anxiety and depressed mood: Secondary | ICD-10-CM | POA: Insufficient documentation

## 2014-12-22 DIAGNOSIS — G35 Multiple sclerosis: Secondary | ICD-10-CM | POA: Insufficient documentation

## 2014-12-22 DIAGNOSIS — R5382 Chronic fatigue, unspecified: Secondary | ICD-10-CM | POA: Insufficient documentation

## 2014-12-22 MED ORDER — HYDROCODONE-ACETAMINOPHEN 7.5-325 MG PO TABS: 1.0000 | ORAL_TABLET | Freq: Three times a day (TID) | ORAL | Status: DC | PRN

## 2014-12-22 MED ORDER — BACLOFEN 10 MG PO TABS: 10.0000 mg | ORAL_TABLET | Freq: Three times a day (TID) | ORAL | Status: DC

## 2014-12-22 MED ORDER — METHYLPHENIDATE HCL 20 MG PO TABS: 20.0000 mg | ORAL_TABLET | Freq: Two times a day (BID) | ORAL | Status: DC

## 2014-12-22 NOTE — Progress Notes (Signed)
[facility]  PATIENT: [name] DOB: 09-30-1961  REFERRING CLINICIAN: Marlan Palau  HISTORY FROM: Patient  REASON FOR VISIT: Multiple sclerosis   HISTORICAL  CHIEF COMPLAINT:  Chief Complaint  Patient presents with  . Multiple Sclerosis      F/U MS.  Sts. she is having more intermittent weakness in right leg, more noticeable when driving.  Sts. neck and back pain are much improved since esi's given by Dr. Ethelene Hal at [facility] on 12-09-14.  She is requesting tpis in neck today to manage remaining pain/fim    HISTORY OF PRESENT ILLNESS:  In summary, [name] is a 54 year old woman who was diagnosed with multiple sclerosis in 2008 when she presented with weakness and fatigue. She was seen by one of the doctors at [facility] neurolog[facility] at that time. She had an MRI of the brain performed which was consistent with multiple sclerosis and she was diagnosed with relapsing remitting MS. He was initially placed on Avonex for therapy. She had some exacerbations with weakness and clumsiness with her gait and she started to see Dr. Leotis Shames. He placed her on Tysabri she is on Tysabri between 2010 and 2013. 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 She tolerates Tecfidera well and she has not had any major exacerbation while she has been on it.  She has several symptoms related to her MS. She has some gait ataxia and reports right greater than left leg clumsiness. She has fallen a few times need to use a cane currently but does use a cane when she also has more pain. She also has bladder dysfunction that is likely related to the MS. She has been diagnosed as having an "uncoordinated pelvic floor".   Tamsulosin was poorly tolerated and she just took a couple days. She sees Dr. Marlou Porch and she is going to have some therapy for her bladder. One of her exacerbations early on included diplopia  as well her symptoms. She continues to note occasional diplopia, especially when she is tired or with gabapentin.  She reports difficulties with fatigue. She notes that the fatigue is both physical and cognitive. She has some difficulties with attention. She feels both physical and cognitive fatigue and her attentional problems improved when she takes Ritalin. She is currently on the Ritalin 20 mg by mouth 3 times a day. He has had some difficulties with depression in the past but that is better. However, she continues to have anxiety. She takes Xanax more for pain and spasticity than for her anxiety.  She has pain but this is likely multifactorial. Some of her pain sounds more dysesthetic in the legs and likely has an element of contribution from the MS. However, she also has degenerative changes in her neck and her lumbar spine causing pain. She recently had an epidural steroid injection in both the cervical and lumbar spine and feels a little better gabapentin helps her pain some but causes a little bit of a hangover in the morning. Flexeril was not well tolerated and caused her to have a hangover throughout the morning when she took it only at night.Marland Kitchen  REVIEW OF SYSTEMS:  Constitutional: No fevers, chills, sweats, or change in appetite Eyes: No visual changes,eye pain.   She has intermittent diplopia Ear, nose and throat: No hearing loss, ear pain, nasal congestion, sore throat Cardiovascular: No chest pain, palpitations Respiratory:  No shortness  of breath at rest or with exertion.   No wheezes GastrointestinaI: No nausea, vomiting, diarrhea, abdominal pain, fecal incontinence Genitourinary:  a sabove Musculoskeletal:  she notes both neck pain and back pain. If she uses one of her limbs she will often have pain down that limb. Integumentary: No rash, pruritus, skin lesions.   She has Raynauds in her hands  Neurological: as above Psychiatric: No depression at this time.  No anxiety Endocrine:  No palpitations, diaphoresis, change in appetite, change in weigh or increased thirst Hematologic/Lymphatic:  No anemia, purpura, petechiae. Allergic/Immunologic: No itchy/runny eyes, nasal congestion, recent allergic reactions, rashes  ALLERGIES: Allergies  Allergen Reactions  . Ibuprofen Other (See Comments)    CAUSES LYMPHEDEMA  . Nsaids Other (See Comments)    AVOIDS ANY SODIUM CONTAINING MED. -- CAUSES LYMPHEDEMA    HOME MEDICATIONS: Outpatient Prescriptions Prior to Visit  Medication Sig Dispense Refill  . ALPRAZolam (XANAX) 0.5 MG tablet Take 0.5 mg by mouth at bedtime as needed for sleep.     . Dimethyl Fumarate (TECFIDERA) 240 MG CPDR Take 1 tablet by mouth 2 (two) times daily.     Marland Kitchen gabapentin (NEURONTIN) 600 MG tablet Take 600 mg by mouth 4 (four) times daily as needed (pain).     Marland Kitchen HYDROcodone-acetaminophen (NORCO) 7.5-325 MG per tablet Take 1-2 tablets by mouth every 8 (eight) hours as needed for pain.     . methylphenidate (RITALIN) 20 MG tablet Take 20-40 mg by mouth 2 (two) times daily. 40 mg in the morning and 20 mg with lunch.    . metoprolol (LOPRESSOR) 50 MG tablet Take 0.5 tablets (25 mg total) by mouth 2 (two) times daily as needed. 30 tablet 0  . desvenlafaxine (PRISTIQ) 100 MG 24 hr tablet Take 100 mg by mouth daily.     No facility-administered medications prior to visit.    PAST MEDICAL HISTORY: Past Medical History  Diagnosis Date  . MVP (mitral valve prolapse) HX HEART RACING  YRS AGO    ASYMPTOMATIC SINCE THEN  . Weakness of right leg SECONDARY TO MS  . History of concussion YOUNG ADULT--  NO RESIDUAL  . Headache(784.0)   . History of edema PERIPHERAL LYMPHEDEMA  . Arthritis   . Bone spur   . MS (multiple sclerosis)   . 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 Movement disorder   . Vision abnormalities     PAST SURGICAL HISTORY: Past Surgical History  Procedure Laterality Date  . Destruction of anal condyloma  07-07-2005  DR TOTH  .  Laparoscopic assisted vaginal hysterectomy  01-15-2002  DR Gerlene Burdock [place]/ DR TIM DAVIS    AND REPAIR VENTRAL HERNIA  . Cesarean section  X3    BILATERAL TUBAL LIGATION W/ LAST ONE  . Knee arthroscopy with lateral menisectomy  10/26/2012    Procedure: KNEE ARTHROSCOPY WITH LATERAL MENISECTOMY;  Surgeon: Javier Docker, MD;  Location: [facility];  Service: Orthopedics;  Laterality: Left;  Left Knee Arthrsocopy with Partial Lateral Menisectomy  . Laser ablation condolamata N/A 04/26/2013    Procedure: LASER ABLATION CONDOLAMATA;  Surgeon: Romie Levee, MD;  Location: [facility];  Service: General;  Laterality: N/A;    FAMILY HISTORY: Family History  Problem Relation Age of Onset  . Diabetes Mother   . Hypertension Mother   . Stroke Mother   . Bowel Disease Brother   . Diabetes Brother   . Healthy Brother   . Emphysema Father   .  Heart attack Father     SOCIAL HISTORY:  History   Social History  . Marital Status: Married    Spouse Name: N/A    Number of Children: N/A  . Years of Education: N/A   Occupational History  . Not on file.   Social History Main Topics  . Smoking status: Never Smoker   . Smokeless tobacco: Never Used  . Alcohol Use: No  . Drug Use: No  . Sexual Activity: Not on file   Other Topics Concern  . Not on file   Social History Narrative     PHYSICAL EXAM  Filed Vitals:   12/22/14 1524  BP: 154/90  Pulse: 68  Resp: 14  Height: 5\' 5"  (1.651 m)  Weight: 167 lb 12.8 oz (76.114 kg)    Body mass index is 27.92 kg/(m^2).   General: The patient is well-developed and well-nourished and in no acute distress  Eyes:  Funduscopic exam shows normal optic discs and retinal vessels. Wearing contacts  Neck: The neck is supple, no carotid bruits are noted.  The neck is nontender.  Respiratory: The respiratory examination is clear.  Cardiovascular: The cardiovascular examination reveals a regular rate and rhythm, no  murmurs, gallops or rubs are noted.  Skin: Extremities are without significant edema.  Neurologic Exam  Mental status: The patient is alert and oriented x 3 at the time of the examination. The patient has apparent normal recent and remote memory, with an apparently normal attention span and concentration ability.   Speech is normal.  Cranial nerves: Extraocular movements are full. Pupils are equal, round, and reactive to light and accomodation.  Visual fields are full.  Facial symmetry is present. There is good facial sensation to soft touch bilaterally.Facial strength is normal.  Trapezius and sternocleidomastoid strength is normal. No dysarthria is noted.  The tongue is midline, and the patient has symmetric elevation of the soft palate. No obvious hearing deficits are noted.  Motor:  Muscle bulk is normal and tone is minimally increased in both legs. Strength is  5 / 5 in all 4 extremities.   Sensory: Sensory testing is intact to pinprick, soft touch, vibration sensation, and position sense on all 4 extremities.  Coordination: Cerebellar testing reveals good finger-nose-finger and heel-to-shin bilaterally.  Gait and station: Station and gait are normal. Tandem gait is wide. Romberg is negative.   Reflexes: Deep tendon reflexes are symmetric and brisk bilaterally. Plantar responses are normal.    DIAGNOSTIC DATA (LABS, IMAGING, TESTING) - I reviewed patient records, labs, notes, testing and imaging myself where available.  Lab Results  Component Value Date   WBC 3.9* 06/17/2014   HGB 15.1* 06/17/2014   HCT 44.7 06/17/2014   MCV 89.8 06/17/2014   PLT 257 06/17/2014      Component Value Date/Time   NA 139 06/17/2014 1845   K 4.6 06/17/2014 1845   CL 98 06/17/2014 1845   CO2 29 06/17/2014 1845   GLUCOSE 87 06/17/2014 1845   BUN 20 06/17/2014 1845   CREATININE 0.94 06/17/2014 1845   CALCIUM 9.4 06/17/2014 1845   PROT 7.6 03/29/2013 1512   ALBUMIN 4.3 03/29/2013 1512   AST  26 03/29/2013 1512   ALT 21 03/29/2013 1512   ALKPHOS 100 03/29/2013 1512   BILITOT 0.4 03/29/2013 1512   GFRNONAA 67* 06/17/2014 1845   GFRAA 78* 06/17/2014 1845   Lab Results  Component Value Date   CHOL  10/05/2008    108  ATP III CLASSIFICATION:  <200     mg/dL   Desirable  119-147  mg/dL   Borderline High  >=829    mg/dL   High   HDL 52 56/21/3086   LDLCALC  10/05/2008    48        Total Cholesterol/HDL:CHD Risk Coronary Heart Disease Risk Table                     Men   Women  1/2 Average Risk   3.4   3.3   TRIG 41 10/05/2008   CHOLHDL 2.1 10/05/2008   Lab Results  Component Value Date   HGBA1C  03/10/2010    5.5 (NOTE) The ADA recommends the following therapeutic goal for glycemic control related to Hgb A1c measurement: Goal of therapy: <6.5 Hgb A1c  Reference: American Diabetes Association: Clinical Practice Recommendations 2010, Diabetes Care, 2010, 33: (Suppl  1).      ASSESSMENT AND PLAN  Multiple sclerosis - Plan: CBC with Differential  Abnormality of gait  Attention deficit disorder  Chronic fatigue  Urinary hesitancy  Adjustment disorder with mixed anxiety and depressed mood   In summary, [name] is a 54 year old woman with relapsing remitting multiple sclerosis who is currently fairly stable on Tecfidera therapy. She has many symptoms related to her MS including fatigue, pain, abnormal gait, urinary hesitancy.   The Ritalin has helped both physical and cognitive fatigue and I will continue her on a dose of 20 mg by mouth 3 times a day. Her pain is multifactorial with components from the MS and from her lumbar and cervical spine degenerative changes. I will renew her hydrocodone at the current dose of 6 pills a day. She will follow-up with Dr. Ethelene Hal as needed. She was recently started on a medicine for her bladder by her urologist and feels she is doing a little better. I will check a CBC with differential to make sure that she does not have  a lymphopenia. Tecfidera is associated with this and if she develops lymphopenia she likely has a higher risk of PML.  She signed a medical release so we can get more of her records from [facility].  She will return to see me in 4 months or call sooner if she has new or worsening neurologic symptoms.   Richard A. Epimenio Foot, MD, PhD 12/22/2014, 3:50 PM Certified in Neurology, Clinical Neurophysiology, Sleep Medicine, Pain Medicine and Neuroimaging  [facility] [address], [address] [place], [place] [address] [phone]

## 2014-12-23 LAB — CBC WITH DIFFERENTIAL/PLATELET
Basophils Absolute: 0 10*3/uL (ref 0.0–0.2)
Basos: 1 %
Eos: 0 %
Eosinophils Absolute: 0 10*3/uL (ref 0.0–0.4)
HCT: 41.2 % (ref 34.0–46.6)
Hemoglobin: 14.1 g/dL (ref 11.1–15.9)
Immature Grans (Abs): 0 10*3/uL (ref 0.0–0.1)
Immature Granulocytes: 0 %
Lymphocytes Absolute: 0.8 10*3/uL (ref 0.7–3.1)
Lymphs: 20 %
MCH: 29.9 pg (ref 26.6–33.0)
MCHC: 34.2 g/dL (ref 31.5–35.7)
MCV: 88 fL (ref 79–97)
Monocytes Absolute: 0.4 10*3/uL (ref 0.1–0.9)
Monocytes: 10 %
Neutrophils Absolute: 2.9 10*3/uL (ref 1.4–7.0)
Neutrophils Relative %: 69 %
RBC: 4.71 x10E6/uL (ref 3.77–5.28)
RDW: 13.6 % (ref 12.3–15.4)
WBC: 4.2 10*3/uL (ref 3.4–10.8)

## 2014-12-24 ENCOUNTER — Telehealth: Payer: Self-pay | Admitting: *Deleted

## 2014-12-24 NOTE — Telephone Encounter (Signed)
Spoke with [facility] and per RAS advised labs look ok--continue Tecfidera as rx.  She verbalized understanding of same/fim

## 2014-12-25 ENCOUNTER — Ambulatory Visit (INDEPENDENT_AMBULATORY_CARE_PROVIDER_SITE_OTHER): Payer: 59 | Admitting: Internal Medicine

## 2014-12-25 ENCOUNTER — Encounter: Payer: Self-pay | Admitting: Internal Medicine

## 2014-12-25 VITALS — BP 126/70 | HR 72 | Temp 97.8°F | Wt 160.0 lb

## 2014-12-25 DIAGNOSIS — M542 Cervicalgia: Secondary | ICD-10-CM

## 2014-12-25 DIAGNOSIS — IMO0002 Reserved for concepts with insufficient information to code with codable children: Secondary | ICD-10-CM

## 2014-12-25 DIAGNOSIS — M1712 Unilateral primary osteoarthritis, left knee: Secondary | ICD-10-CM

## 2014-12-25 DIAGNOSIS — M179 Osteoarthritis of knee, unspecified: Secondary | ICD-10-CM

## 2014-12-25 DIAGNOSIS — M1710 Unilateral primary osteoarthritis, unspecified knee: Secondary | ICD-10-CM

## 2014-12-25 DIAGNOSIS — Z0000 Encounter for general adult medical examination without abnormal findings: Secondary | ICD-10-CM

## 2014-12-25 DIAGNOSIS — M6289 Other specified disorders of muscle: Secondary | ICD-10-CM

## 2014-12-25 DIAGNOSIS — M545 Low back pain, unspecified: Secondary | ICD-10-CM

## 2014-12-25 DIAGNOSIS — G8929 Other chronic pain: Secondary | ICD-10-CM

## 2014-12-25 DIAGNOSIS — N8184 Pelvic muscle wasting: Secondary | ICD-10-CM

## 2014-12-25 DIAGNOSIS — Z8701 Personal history of pneumonia (recurrent): Secondary | ICD-10-CM

## 2014-12-25 DIAGNOSIS — R269 Unspecified abnormalities of gait and mobility: Secondary | ICD-10-CM

## 2014-12-25 DIAGNOSIS — G35 Multiple sclerosis: Secondary | ICD-10-CM

## 2014-12-25 DIAGNOSIS — R4189 Other symptoms and signs involving cognitive functions and awareness: Secondary | ICD-10-CM

## 2014-12-25 DIAGNOSIS — R5382 Chronic fatigue, unspecified: Secondary | ICD-10-CM

## 2014-12-25 LAB — COMPREHENSIVE METABOLIC PANEL
ALT: 18 U/L (ref 0–35)
AST: 21 U/L (ref 0–37)
Albumin: 4.3 g/dL (ref 3.5–5.2)
Alkaline Phosphatase: 81 U/L (ref 39–117)
BUN: 14 mg/dL (ref 6–23)
CO2: 26 mEq/L (ref 19–32)
Calcium: 9.3 mg/dL (ref 8.4–10.5)
Chloride: 100 mEq/L (ref 96–112)
Creat: 0.75 mg/dL (ref 0.50–1.10)
Glucose, Bld: 92 mg/dL (ref 70–99)
Potassium: 3.8 mEq/L (ref 3.5–5.3)
Sodium: 138 mEq/L (ref 135–145)
Total Bilirubin: 0.5 mg/dL (ref 0.2–1.2)
Total Protein: 6.7 g/dL (ref 6.0–8.3)

## 2014-12-25 LAB — LIPID PANEL
Cholesterol: 196 mg/dL (ref 0–200)
HDL: 92 mg/dL (ref >39)
LDL Cholesterol: 91 mg/dL (ref 0–99)
Total CHOL/HDL Ratio: 2.1 Ratio
Triglycerides: 65 mg/dL (ref <150)
VLDL: 13 mg/dL (ref 0–40)

## 2014-12-25 LAB — POCT URINALYSIS DIPSTICK
Bilirubin, UA: NEGATIVE
Blood, UA: NEGATIVE
Glucose, UA: NEGATIVE
Ketones, UA: NEGATIVE
Leukocytes, UA: NEGATIVE
Nitrite, UA: NEGATIVE
Protein, UA: NEGATIVE
Spec Grav, UA: 1.01
Urobilinogen, UA: NEGATIVE
pH, UA: 7.5

## 2014-12-25 LAB — TSH: TSH: 1.751 u[IU]/mL (ref 0.350–4.500)

## 2014-12-25 LAB — T4, FREE: Free T4: 1.23 ng/dL (ref 0.80–1.80)
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
## 2014-12-25 NOTE — Patient Instructions (Signed)
Cleared for left knee replacement surgery.

## 2014-12-28 ENCOUNTER — Emergency Department (HOSPITAL_COMMUNITY): Payer: 59

## 2014-12-28 ENCOUNTER — Observation Stay (HOSPITAL_COMMUNITY)
Admission: EM | Admit: 2014-12-28 | Discharge: 2014-12-30 | Disposition: A | Discharge status: Home / Self Care | Payer: 59 | Attending: Internal Medicine | Admitting: Internal Medicine

## 2014-12-28 ENCOUNTER — Encounter (HOSPITAL_COMMUNITY): Payer: Self-pay | Admitting: Emergency Medicine

## 2014-12-28 DIAGNOSIS — I480 Paroxysmal atrial fibrillation: Secondary | ICD-10-CM | POA: Diagnosis present

## 2014-12-28 DIAGNOSIS — I341 Nonrheumatic mitral (valve) prolapse: Secondary | ICD-10-CM | POA: Insufficient documentation

## 2014-12-28 DIAGNOSIS — R079 Chest pain, unspecified: Principal | ICD-10-CM | POA: Diagnosis present

## 2014-12-28 DIAGNOSIS — G35 Multiple sclerosis: Secondary | ICD-10-CM | POA: Insufficient documentation

## 2014-12-28 DIAGNOSIS — F419 Anxiety disorder, unspecified: Secondary | ICD-10-CM | POA: Insufficient documentation

## 2014-12-28 DIAGNOSIS — Z886 Allergy status to analgesic agent status: Secondary | ICD-10-CM | POA: Insufficient documentation

## 2014-12-28 LAB — BASIC METABOLIC PANEL
Anion gap: 4 — ABNORMAL LOW (ref 5–15)
BUN: 13 mg/dL (ref 6–23)
CO2: 32 mmol/L (ref 19–32)
Calcium: 9.1 mg/dL (ref 8.4–10.5)
Chloride: 103 mmol/L (ref 96–112)
Creatinine, Ser: 0.89 mg/dL (ref 0.50–1.10)
GFR calc Af Amer: 83 mL/min — ABNORMAL LOW (ref >90)
GFR calc non Af Amer: 72 mL/min — ABNORMAL LOW (ref >90)
Glucose, Bld: 132 mg/dL — ABNORMAL HIGH (ref 70–99)
Potassium: 3.7 mmol/L (ref 3.5–5.1)
Sodium: 139 mmol/L (ref 135–145)

## 2014-12-28 LAB — CBC
HCT: 41.4 % (ref 36.0–46.0)
Hemoglobin: 14.1 g/dL (ref 12.0–15.0)
MCH: 30.5 pg (ref 26.0–34.0)
MCHC: 34.1 g/dL (ref 30.0–36.0)
MCV: 89.6 fL (ref 78.0–100.0)
Platelets: 245 10*3/uL (ref 150–400)
RBC: 4.62 MIL/uL (ref 3.87–5.11)
RDW: 13 % (ref 11.5–15.5)
WBC: 4.8 10*3/uL (ref 4.0–10.5)

## 2014-12-28 LAB — I-STAT TROPONIN, ED: Troponin i, poc: 0 ng/mL (ref 0.00–0.08)

## 2014-12-28 LAB — BRAIN NATRIURETIC PEPTIDE: B Natriuretic Peptide: 86.6 pg/mL (ref 0.0–100.0)

## 2014-12-28 MED ORDER — HEPARIN SODIUM (PORCINE) 5000 UNIT/ML IJ SOLN
5000.0000 [IU] | Freq: Three times a day (TID) | INTRAMUSCULAR | Status: DC
  Administered 2014-12-29 – 2014-12-30 (×5): 5000 [IU] via SUBCUTANEOUS
  Filled 2014-12-28 (×8): qty 1

## 2014-12-28 MED ORDER — DIMETHYL FUMARATE 240 MG PO CPDR
1.0000 | DELAYED_RELEASE_CAPSULE | Freq: Two times a day (BID) | ORAL | Status: DC
  Administered 2014-12-30: 240 mg via ORAL
  Filled 2014-12-28 (×3): qty 1

## 2014-12-28 MED ORDER — ALPRAZOLAM 0.5 MG PO TABS
0.5000 mg | ORAL_TABLET | Freq: Three times a day (TID) | ORAL | Status: DC | PRN
  Administered 2014-12-29 (×2): 0.5 mg via ORAL
  Filled 2014-12-28 (×2): qty 1
  Filled 2014-12-28: qty 2

## 2014-12-28 MED ORDER — BACLOFEN 10 MG PO TABS
10.0000 mg | ORAL_TABLET | Freq: Three times a day (TID) | ORAL | Status: DC
  Filled 2014-12-28 (×7): qty 1

## 2014-12-28 MED ORDER — ONDANSETRON HCL 4 MG/2ML IJ SOLN: 4.0000 mg | Freq: Four times a day (QID) | INTRAMUSCULAR | Status: DC | PRN

## 2014-12-28 MED ORDER — ACETAMINOPHEN 325 MG PO TABS: 650.0000 mg | ORAL_TABLET | ORAL | Status: DC | PRN

## 2014-12-28 MED ORDER — GABAPENTIN 600 MG PO TABS
600.0000 mg | ORAL_TABLET | Freq: Four times a day (QID) | ORAL | Status: DC | PRN
  Administered 2014-12-29: 600 mg via ORAL
  Filled 2014-12-28 (×3): qty 1

## 2014-12-28 MED ORDER — HYDROCODONE-ACETAMINOPHEN 7.5-325 MG PO TABS
1.0000 | ORAL_TABLET | Freq: Three times a day (TID) | ORAL | Status: DC | PRN
Start: 2014-12-28 — End: 2014-12-30
  Administered 2014-12-29: 2 via ORAL
  Filled 2014-12-28: qty 2

## 2014-12-28 MED ORDER — METOPROLOL TARTRATE 25 MG PO TABS
25.0000 mg | ORAL_TABLET | Freq: Two times a day (BID) | ORAL | Status: DC | PRN
  Filled 2014-12-28: qty 1

## 2014-12-28 NOTE — ED Notes (Signed)
Per EMS: pt at home when she developed substernal chest pain with radiation to back, pt also reports feeling like her heart was racing and reports some sob. EMS noted pt in afib with rate of 90's120's- pt states she has not been formally diagnosed with a-fib but is suppose to be seeing PCP for chest pain and palpitations. Given 324 mg of aspirin and 2 nitro with minimal relief. Pt in NAD, axo x4.

## 2014-12-28 NOTE — H&P (Signed)
[facility] History and Physical  [name] GNF:[id number] DOB: 1961-10-29 DOA: 12/28/2014  Referring physician: EDP PCP: [name], MD   Chief Complaint: Chest pain   HPI: [name] is a 54 y.o. female who presents to the ED with sudden onset of palpitations and chest pain this evening.  Patient tried NTG at home which didn't help, and metoprolol at home as well.  When EMS got to the patient she was noted to have tachycardia into the 150s was still in the 120s on arrival to ED with chest pain.  Then spontaneously, her A.Fib RVR converted into NSR with a rate of 60s-70s and her chest pain has since completely resolved.  She had a single similar episode some 7 months ago or so which was also A.Fib RVR that spontaneously resolved, she was supposed to follow up with cardiology since then, but admits that she didn't.  Review of Systems: Systems reviewed.  As above, otherwise negative  Past Medical History  Diagnosis Date  . MVP (mitral valve prolapse) HX HEART RACING  YRS AGO    ASYMPTOMATIC SINCE THEN  . Weakness of right leg SECONDARY TO MS  . History of concussion YOUNG ADULT--  NO RESIDUAL  . Headache(784.0)   . History of edema PERIPHERAL LYMPHEDEMA  . Arthritis   . Bone spur   . MS (multiple sclerosis)   . 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 Movement disorder   . Vision abnormalities    Past Surgical History  Procedure Laterality Date  . Destruction of anal condyloma  07-07-2005  DR TOTH  . Laparoscopic assisted vaginal hysterectomy  01-15-2002  DR Gerlene Burdock [facility]/ DR TIM DAVIS    AND REPAIR VENTRAL HERNIA  . Cesarean section  X3    BILATERAL TUBAL LIGATION W/ LAST ONE  . Knee arthroscopy with lateral menisectomy  10/26/2012    Procedure: KNEE ARTHROSCOPY WITH LATERAL MENISECTOMY;  Surgeon: Javier Docker, MD;  Location: [facility];  Service: Orthopedics;  Laterality: Left;  Left Knee Arthrsocopy with Partial Lateral  Menisectomy  . Laser ablation condolamata N/A 04/26/2013    Procedure: LASER ABLATION CONDOLAMATA;  Surgeon: Romie Levee, MD;  Location: [facility];  Service: General;  Laterality: N/A;   Social History:  reports that she has never smoked. She has never used smokeless tobacco. She reports that she does not drink alcohol or use illicit drugs.  Allergies  Allergen Reactions  . Ibuprofen Other (See Comments)    CAUSES LYMPHEDEMA  . Nsaids Other (See Comments)    AVOIDS ANY SODIUM CONTAINING MED. -- CAUSES LYMPHEDEMA    Family History  Problem Relation Age of Onset  . Diabetes Mother   . Hypertension Mother   . Stroke Mother   . Bowel Disease Brother   . Diabetes Brother   . Healthy Brother   . Emphysema Father   . Heart attack Father      Prior to Admission medications   Medication Sig Start Date End Date Taking? Authorizing Provider  ALPRAZolam Prudy Feeler) 0.5 MG tablet Take 0.5 mg by mouth 3 (three) times daily as needed for anxiety or sleep.    Yes Historical Provider, MD  Dimethyl Fumarate (TECFIDERA) 240 MG CPDR Take 1 tablet by mouth 2 (two) times daily.    Yes Historical Provider, MD  gabapentin (NEURONTIN) 600 MG tablet Take 600 mg by mouth 4 (four) times daily as needed (pain).    Yes Historical Provider, MD  HYDROcodone-acetaminophen (NORCO) 7.5-325 MG per tablet Take 1-2 tablets by mouth every 8 (eight) hours as needed. Patient taking differently: Take 1-2 tablets by mouth every 8 (eight) hours as needed (pain).  01/06/15  Yes Richard A. [name], MD  methylphenidate (RITALIN) 20 MG tablet Take 1-2 tablets (20-40 mg total) by mouth 2 (two) times daily. 40 mg in the morning and 20 mg with lunch. 01/06/15  Yes Richard A. [name], MD  metoprolol (LOPRESSOR) 50 MG tablet Take 0.5 tablets (25 mg total) by mouth 2 (two) times daily as needed. Patient taking differently: Take 25 mg by mouth 2 (two) times daily as needed (racing heartbeat).  06/17/14  Yes Clement Sayres, MD   baclofen (LIORESAL) 10 MG tablet Take 1 tablet (10 mg total) by mouth 3 (three) times daily. 12/22/14   Richard A. [name], MD   Physical Exam: Filed Vitals:   12/28/14 2200  BP: 109/66  Pulse: 62  Temp:   Resp: 13    BP 109/66 mmHg  Pulse 62  Temp(Src) 97.7 F (36.5 C) (Oral)  Resp 13  Ht  (1.651 m)  Wt 76.658 kg (169 lb)  BMI 28.12 kg/m2  SpO2 98%  General Appearance:    Alert, oriented, no distress, appears stated age  Head:    Normocephalic, atraumatic  Eyes:    PERRL, EOMI, sclera non-icteric        Nose:   Nares without drainage or epistaxis. Mucosa, turbinates normal  Throat:   Moist mucous membranes. Oropharynx without erythema or exudate.  Neck:   Supple. No carotid bruits.  No thyromegaly.  No lymphadenopathy.   Back:     No CVA tenderness, no spinal tenderness  Lungs:     Clear to auscultation bilaterally, without wheezes, rhonchi or rales  Chest wall:    No tenderness to palpitation  Heart:    Regular rate and rhythm without murmurs, gallops, rubs  Abdomen:     Soft, non-tender, nondistended, normal bowel sounds, no organomegaly  Genitalia:    deferred  Rectal:    deferred  Extremities:   No clubbing, cyanosis or edema.  Pulses:   2+ and symmetric all extremities  Skin:   Skin color, texture, turgor normal, no rashes or lesions  Lymph nodes:   Cervical, supraclavicular, and axillary nodes normal  Neurologic:   CNII-XII intact. Normal strength, sensation and reflexes      throughout    Labs on Admission:  Basic Metabolic Panel:  Recent Labs Lab 12/25/14 1504 12/28/14 2105  NA 138 139  K 3.8 3.7  CL 100 103  CO2 26 32  GLUCOSE 92 132*  BUN 14 13  CREATININE 0.75 0.89  CALCIUM 9.3 9.1   Liver Function Tests:  Recent Labs Lab 12/25/14 1504  AST 21  ALT 18  ALKPHOS 81  BILITOT 0.5  PROT 6.7  ALBUMIN 4.3   No results for input(s): LIPASE, AMYLASE in the last 168 hours. No results for input(s): AMMONIA in the last 168  hours. CBC:  Recent Labs Lab 12/22/14 1650 12/28/14 2105  WBC 4.2 4.8  NEUTROABS 2.9  --   HGB 14.1 14.1  HCT 41.2 41.4  MCV 88 89.6  PLT  --  245   Cardiac Enzymes: No results for input(s): CKTOTAL, CKMB, CKMBINDEX, TROPONINI in the last 168 hours.  BNP (last 3 results) No results for input(s): PROBNP in the last 8760 hours. CBG: No results for input(s): GLUCAP in the last 168 hours.  Radiological Exams on  Admission: Dg Chest Port 1 View  12/28/2014   CLINICAL DATA:  Central chest pain and shortness of breath. Symptoms tonight.  EXAM: PORTABLE CHEST - 1 VIEW  COMPARISON:  06/17/2014  FINDINGS: Cardiac silhouette normal in size and configuration. No mediastinal or hilar masses.  Mild increased markings in the medial lung bases, but no convincing pneumonia and no pulmonary edema. No pleural effusion or pneumothorax.  Bony thorax is unremarkable.  IMPRESSION: No active disease.   Electronically Signed   By: Amie Portland M.D.   On: 12/28/2014 21:11    EKG: Independently reviewed.  Assessment/Plan Active Problems:   Chest pain   Paroxysmal atrial fibrillation   1. Chest pain - suspect this was rate mediated by her tachycardia, shortly after she converted from A.Fib RVR to NSR, her chest pain has also resolved.  There may or may not be a component of demand ischemia, serial troponins are pending to evaluate this; however, primary plaque wall rupture ACS seems unlikely given the presentation. 1. Serial trops 2. Tele monitor 3. Chest pain obs path 4. NPO after midnight  Code Status: Full Code  Family Communication: Husband at bedside Disposition Plan: Admit to obs   Time spent: 50 mion  GARDNER, JARED M. [facility] Pager [phone]  If 7AM-7PM, please contact the day team taking care of the patient [email or web address] Password [facility] 12/28/2014, 11:36 PM

## 2014-12-28 NOTE — ED Provider Notes (Signed)
CSN: [id number]     Arrival date & time 12/28/14  2054 History   First MD Initiated Contact with Patient 12/28/14 2112     Chief Complaint  Patient presents with  . Chest Pain     (Consider location/radiation/quality/duration/timing/severity/associated sxs/prior Treatment) Patient is a 54 y.o. female presenting with chest pain. The history is provided by the patient. No language interpreter was used.  Chest Pain Pain location:  Substernal area Pain quality: dull   Pain radiates to:  L arm Pain radiates to the back: no   Pain severity:  Moderate Onset quality:  Gradual Duration:  1 day Timing:  Intermittent Progression:  Waxing and waning Chronicity:  New Relieved by:  Nothing Worsened by:  Exertion Ineffective treatments:  None tried Associated symptoms: palpitations   Associated symptoms: no abdominal pain, no anorexia, no back pain, no cough, no dizziness, no fever, no lower extremity edema, no nausea, no numbness, no shortness of breath and not vomiting   Risk factors: no aortic disease, no coronary artery disease, no high cholesterol, no hypertension and not female     Past Medical History  Diagnosis Date  . MVP (mitral valve prolapse) HX HEART RACING  YRS AGO    ASYMPTOMATIC SINCE THEN  . Weakness of right leg SECONDARY TO MS  . History of concussion YOUNG ADULT--  NO RESIDUAL  . Headache(784.0)   . History of edema PERIPHERAL LYMPHEDEMA  . Arthritis   . Bone spur   . MS (multiple sclerosis)   . 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 Movement disorder   . Vision abnormalities    Past Surgical History  Procedure Laterality Date  . Destruction of anal condyloma  07-07-2005  DR TOTH  . Laparoscopic assisted vaginal hysterectomy  01-15-2002  DR Gerlene Burdock [facility]/ DR TIM DAVIS    AND REPAIR VENTRAL HERNIA  . Cesarean section  X3    BILATERAL TUBAL LIGATION W/ LAST ONE  . Knee arthroscopy with lateral menisectomy  10/26/2012    Procedure: KNEE ARTHROSCOPY WITH  LATERAL MENISECTOMY;  Surgeon: Javier Docker, MD;  Location: [facility] ;  Service: Orthopedics;  Laterality: Left;  Left Knee Arthrsocopy with Partial Lateral Menisectomy  . Laser ablation condolamata N/A 04/26/2013    Procedure: LASER ABLATION CONDOLAMATA;  Surgeon: Romie Levee, MD;  Location: [facility];  Service: General;  Laterality: N/A;   Family History  Problem Relation Age of Onset  . Diabetes Mother   . Hypertension Mother   . Stroke Mother   . Bowel Disease Brother   . Diabetes Brother   . Healthy Brother   . Emphysema Father   . Heart attack Father    History  Substance Use Topics  . Smoking status: Never Smoker   . Smokeless tobacco: Never Used  . Alcohol Use: No   OB History    No data available     Review of Systems  Constitutional: Negative for fever and chills.  Respiratory: Negative for cough and shortness of breath.   Cardiovascular: Positive for chest pain and palpitations. Negative for leg swelling.  Gastrointestinal: Negative for nausea, vomiting, abdominal pain and anorexia.  Genitourinary: Negative for dysuria, urgency and frequency.  Musculoskeletal: Negative for back pain.  Neurological: Negative for dizziness and numbness.  All other systems reviewed and are negative.     Allergies  Ibuprofen and Nsaids  Home Medications   Prior to Admission medications   Medication Sig Start Date End  Date Taking? Authorizing Provider  ALPRAZolam Prudy Feeler) 0.5 MG tablet Take 0.5 mg by mouth 3 (three) times daily as needed for anxiety or sleep.    Yes Historical Provider, MD  ALPRAZolam Prudy Feeler) 0.5 MG tablet Take 0.5 mg by mouth at bedtime.   Yes Historical Provider, MD  Dimethyl Fumarate (TECFIDERA) 240 MG CPDR Take 1 tablet by mouth every 12 (twelve) hours.    Yes Historical Provider, MD  gabapentin (NEURONTIN) 600 MG tablet Take 900 mg by mouth 2 (two) times daily. Takes 1.5 tablets at 6pm and 1.5 tablets at 9pm   Yes  Historical Provider, MD  HYDROcodone-acetaminophen (NORCO) 7.5-325 MG per tablet Take 1-2 tablets by mouth every 8 (eight) hours as needed. Patient taking differently: Take 2 tablets by mouth every 8 (eight) hours.  01/06/15  Yes Richard A. [name], MD  methylphenidate (RITALIN) 20 MG tablet Take 1-2 tablets (20-40 mg total) by mouth 2 (two) times daily. 40 mg in the morning and 20 mg with lunch. 01/06/15  Yes Richard A. [name], MD  metoprolol (LOPRESSOR) 50 MG tablet Take 0.5 tablets (25 mg total) by mouth 2 (two) times daily as needed. Patient taking differently: Take 25 mg by mouth 2 (two) times daily as needed (racing heartbeat).  06/17/14  Yes Clement Sayres, MD  baclofen (LIORESAL) 10 MG tablet Take 1 tablet (10 mg total) by mouth 3 (three) times daily. 12/22/14   Richard A. [name], MD   BP 92/49 mmHg  Pulse 61  Temp(Src) 97.9 F (36.6 C) (Oral)  Resp 14  Ht  (1.651 m)  Wt 168 lb 3.4 oz (76.3 kg)  BMI 27.99 kg/m2  SpO2 95% Physical Exam  Constitutional: She is oriented to person, place, and time. She appears well-developed and well-nourished. No distress.  HENT:  Head: Normocephalic and atraumatic.  Eyes: Pupils are equal, round, and reactive to light.  Neck: Normal range of motion.  Cardiovascular: Normal rate, regular rhythm, normal heart sounds and intact distal pulses.   Pulmonary/Chest: Effort normal. No respiratory distress. She has no wheezes. She exhibits no tenderness.  Abdominal: Soft. Bowel sounds are normal. She exhibits no distension. There is no tenderness. There is no rebound and no guarding.  Neurological: She is alert and oriented to person, place, and time. She has normal strength. No cranial nerve deficit or sensory deficit. She exhibits normal muscle tone. Coordination and gait normal.  Skin: Skin is warm and dry.  Nursing note and vitals reviewed.   ED Course  Procedures (including critical care time) Labs Review Labs Reviewed  BASIC METABOLIC PANEL - Abnormal;  Notable for the following:    Glucose, Bld 132 (*)    GFR calc non Af Amer 72 (*)    GFR calc Af Amer 83 (*)    Anion gap 4 (*)    All other components within normal limits  CBC  BRAIN NATRIURETIC PEPTIDE  TROPONIN I  TROPONIN I  TROPONIN I  Rosezena Sensor, ED    Imaging Review Dg Chest Port 1 View  12/28/2014   CLINICAL DATA:  Central chest pain and shortness of breath. Symptoms tonight.  EXAM: PORTABLE CHEST - 1 VIEW  COMPARISON:  06/17/2014  FINDINGS: Cardiac silhouette normal in size and configuration. No mediastinal or hilar masses.  Mild increased markings in the medial lung bases, but no convincing pneumonia and no pulmonary edema. No pleural effusion or pneumothorax.  Bony thorax is unremarkable.  IMPRESSION: No active disease.   Electronically Signed   By:  Amie Portland M.D.   On: 12/28/2014 21:11     EKG Interpretation   Date/Time:  Sunday December 28 2014 21:02:31 EST Ventricular Rate:  109 PR Interval:    QRS Duration: 87 QT Interval:  340 QTC Calculation: 458 R Axis:   65 Text Interpretation:  Atrial fibrillation afib new Non-specific ST-t  changes Confirmed by Juleen China  MD, STEPHEN ([id number]) on 12/28/2014 9:46:05 PM      MDM   Final diagnoses:  Chest pain, unspecified chest pain type  Paroxysmal atrial fibrillation     Patient is a 54 year old Caucasian female with pertinent past medical history of atrial fibrillation who comes to the emergency department today with precordial chest pressure radiating to her left arm and tachycardia. Patient was brought in by EMS and was found to have atrial fibrillation with RVR present.  Upon arrival to the emergency department her atrial fibrillation was still present with a heart rate in the 80s. As a result I do not feel she requires rate control at this time. Initial workup included an i-STAT troponin, CBC, BMP, BNP, and a chest x-ray. Chest x-ray demonstrated mildly increased markings in the medial lung bases but no  consolidations. The patient has not had any shortness of breath, cough, fevers or chills.  As a result doubt a pneumonia. BNP was 86. BMP was unremarkable. CBC was unremarkable. Troponin was negative. With patient's atrial fibrillation and chest pressure she was felt to require admission to the hospital for an ACS rule out.  Patient has had no pleuritic component to her pain as result I doubt a PE.  Patient's care was discussed with cardiology who requested that she be admitted to medicine with them as a consult. The patient was admitted to the hospitalist service in a good condition. Labs and imaging reviewed by myself and considered in medical decision making. Imaging was interpreted radiology. Care was discussed my attending Dr. Juleen China.     Bethann Berkshire, MD 12/29/14 1638  Raeford Razor, MD 01/05/15 [phone]

## 2014-12-28 NOTE — ED Notes (Addendum)
Pt presents to ED via [facility] w/ c/o central chest pain that began today described as burning - pt w/ new diagnosis of a-fib in July and was supposed to follow up w/ cardiology however pt never did. Pt also admits to some shortness of breath and palpitations. Pt reports no relief in pain s/p x2 SL nitro.

## 2014-12-29 ENCOUNTER — Encounter (HOSPITAL_COMMUNITY): Payer: Self-pay | Admitting: *Deleted

## 2014-12-29 DIAGNOSIS — I480 Paroxysmal atrial fibrillation: Secondary | ICD-10-CM | POA: Diagnosis not present

## 2014-12-29 DIAGNOSIS — R079 Chest pain, unspecified: Secondary | ICD-10-CM

## 2014-12-29 DIAGNOSIS — G35 Multiple sclerosis: Secondary | ICD-10-CM | POA: Diagnosis not present

## 2014-12-29 DIAGNOSIS — I341 Nonrheumatic mitral (valve) prolapse: Secondary | ICD-10-CM | POA: Diagnosis not present

## 2014-12-29 LAB — TROPONIN I
Troponin I: <0.03 ng/mL (ref <0.031)
Troponin I: <0.03 ng/mL (ref <0.031)
Troponin I: <0.03 ng/mL (ref <0.031)
Troponin I: <0.03 ng/mL (ref <0.031)

## 2014-12-29 LAB — TSH: TSH: 2.465 u[IU]/mL (ref 0.350–4.500)

## 2014-12-29 MED ORDER — SODIUM CHLORIDE 0.9 % IV SOLN
INTRAVENOUS | Status: DC
  Administered 2014-12-29 (×2): via INTRAVENOUS

## 2014-12-29 MED ORDER — GABAPENTIN 300 MG PO CAPS
900.0000 mg | ORAL_CAPSULE | Freq: Two times a day (BID) | ORAL | Status: DC
  Administered 2014-12-29 (×2): 900 mg via ORAL
  Filled 2014-12-29 (×4): qty 3

## 2014-12-29 MED ORDER — SODIUM CHLORIDE 0.9 % IV BOLUS (SEPSIS)
500.0000 mL | Freq: Once | INTRAVENOUS | Status: AC
  Administered 2014-12-29: 500 mL via INTRAVENOUS

## 2014-12-29 NOTE — Progress Notes (Signed)
Echocardiogram  2D Echocardiogram has been performed.  Leta Jungling M 12/29/2014, 10:38 AM

## 2014-12-29 NOTE — Progress Notes (Signed)
[facility] PROGRESS NOTE  [name] VXB:[id number] DOB: August 13, 1961 DOA: 12/28/2014 PCP: [name], MD Interims summary:  [name] is a 54 y.o. female who presents to the ED with sudden onset of palpitations and chest pain . Patient tried NTG at home which didn't help, and metoprolol at home as well. When EMS got to the patient she was noted to have tachycardia into the 150s was still in the 120s on arrival to ED with chest pain. Apparently she was found to be in A.Fib RVR and she  Converted spontaneously  into NSR with a rate of 60s-70s and her chest pain has since completely resolved. Assessment/Plan:             Chest pain: Admitted to telemetry and evaluate for ACS.  Serial troponins negative.  Echocardiogram ordered and pending.    ? Paroxysmal afib:  NSR now.  On metoprolol at home as needed. She reports she was anxious and stressed out and felt these episodes come on when she feels stressed out.  Review echo and will call cardiology as needed.    Code Status: full code.  Family Communication: none at bedside Disposition Plan: pending   Consultants:  cardiology  Procedures:  Echocardiogram   Antibiotics:  none  HPI/Subjective: Very calm, no chest pain or sob.   Objective: Filed Vitals:   12/29/14 1409  BP: 99/61  Pulse: 67  Temp: 98.1 F (36.7 C)  Resp: 18    Intake/Output Summary (Last 24 hours) at 12/29/14 1834 Last data filed at 12/29/14 1230  Gross per 24 hour  Intake    240 ml  Output      0 ml  Net    240 ml   Filed Weights   12/28/14 2104 12/28/14 2353 12/29/14 0608  Weight: 76.658 kg (169 lb) 69.5 kg (153 lb 3.5 oz) 76.3 kg (168 lb 3.4 oz)    Exam:   General:  ALERT afebrile comfortable  Cardiovascular: s1s2, no mrg  Respiratory: clear to auscultation, no wheezing or rhonchi  Abdomen: soft nontender non distended bowel sounds heard  Musculoskeletal: no pedal edema.   Data Reviewed: Basic Metabolic  Panel:  Recent Labs Lab 12/25/14 1504 12/28/14 2105  NA 138 139  K 3.8 3.7  CL 100 103  CO2 26 32  GLUCOSE 92 132*  BUN 14 13  CREATININE 0.75 0.89  CALCIUM 9.3 9.1   Liver Function Tests:  Recent Labs Lab 12/25/14 1504  AST 21  ALT 18  ALKPHOS 81  BILITOT 0.5  PROT 6.7  ALBUMIN 4.3   No results for input(s): LIPASE, AMYLASE in the last 168 hours. No results for input(s): AMMONIA in the last 168 hours. CBC:  Recent Labs Lab 12/28/14 2105  WBC 4.8  HGB 14.1  HCT 41.4  MCV 89.6  PLT 245   Cardiac Enzymes:  Recent Labs Lab 12/28/14 2330 12/29/14 0320 12/29/14 0525  TROPONINI <0.03 <0.03 <0.03   BNP (last 3 results) No results for input(s): PROBNP in the last 8760 hours. CBG: No results for input(s): GLUCAP in the last 168 hours.  No results found for this or any previous visit (from the past 240 hour(s)).   Studies: Dg Chest Port 1 View  12/28/2014   CLINICAL DATA:  Central chest pain and shortness of breath. Symptoms tonight.  EXAM: PORTABLE CHEST - 1 VIEW  COMPARISON:  06/17/2014  FINDINGS: Cardiac silhouette normal in size and configuration. No mediastinal or hilar masses.  Mild increased  markings in the medial lung bases, but no convincing pneumonia and no pulmonary edema. No pleural effusion or pneumothorax.  Bony thorax is unremarkable.  IMPRESSION: No active disease.   Electronically Signed   By: Amie Portland M.D.   On: 12/28/2014 21:11    Scheduled Meds: . baclofen  10 mg Oral TID  . Dimethyl Fumarate   Oral BID  . gabapentin  900 mg Oral BID  . heparin  5,000 Units Subcutaneous 3 times per day   Continuous Infusions: . sodium chloride 75 mL/hr at 12/29/14 1829    Active Problems:   Chest pain   Paroxysmal atrial fibrillation    Time spent: 25 MINUTES.     [facility]  [facility] Pager [phone]. If 7PM-7AM, please contact night-coverage at [email or web address], password [facility] 12/29/2014, 6:34 PM  LOS: 1 day

## 2014-12-29 NOTE — Progress Notes (Signed)
Pt admitted from ED with c/o of chest pain,denies any pain at this time,pt settled in bed,tele monitor put on pt, call light at bedside, will however continue to monitor. [name], [name]

## 2014-12-29 NOTE — Progress Notes (Signed)
UR completed 

## 2014-12-30 ENCOUNTER — Encounter (HOSPITAL_COMMUNITY): Payer: Self-pay | Admitting: Cardiology

## 2014-12-30 DIAGNOSIS — R072 Precordial pain: Secondary | ICD-10-CM

## 2014-12-30 LAB — TROPONIN I
Troponin I: <0.03 ng/mL (ref <0.031)
Troponin I: <0.03 ng/mL (ref <0.031)

## 2014-12-30 LAB — T4, FREE: Free T4: 1.09 ng/dL (ref 0.80–1.80)

## 2014-12-30 MED ORDER — ASPIRIN 81 MG PO CHEW
81.0000 mg | CHEWABLE_TABLET | Freq: Every day | ORAL | Status: DC
  Administered 2014-12-30: 81 mg via ORAL
  Filled 2014-12-30: qty 1

## 2014-12-30 MED ORDER — PANTOPRAZOLE SODIUM 20 MG PO TBEC: 20.0000 mg | DELAYED_RELEASE_TABLET | Freq: Every day | ORAL | Status: DC

## 2014-12-30 MED ORDER — METOPROLOL TARTRATE 50 MG PO TABS: 25.0000 mg | ORAL_TABLET | Freq: Two times a day (BID) | ORAL | Status: DC | PRN

## 2014-12-30 MED ORDER — ASPIRIN 81 MG PO CHEW
81.0000 mg | CHEWABLE_TABLET | Freq: Every day | ORAL | Status: DC
Start: 2014-12-30 — End: 2015-02-12

## 2014-12-30 MED ORDER — ALPRAZOLAM 0.5 MG PO TABS
0.5000 mg | ORAL_TABLET | Freq: Once | ORAL | Status: AC
  Administered 2014-12-30: 0.5 mg via ORAL

## 2014-12-30 NOTE — Progress Notes (Signed)
Patient wanted I.V. Fluids stop due to she feels she is retaining fluid and that cause pressure and makes her hurt more.

## 2014-12-30 NOTE — Progress Notes (Signed)
Pt discharge education and instructions completed with pt and spouse at bedside. All voices understanding and denies any questions. Pt IV and telemetry removed; pt handed her prescription for protonix, aspirin, and lopressor; pt home medication picked up from pharmacy and handed to pt. Pt discharge home with spouse to transport her home. Pt transported off unit via wheelchair with belongings and spouse to the side. Arabella Merles Donise Woodle RN.

## 2014-12-30 NOTE — Consult Note (Signed)
Primary cardiologist: New  HPI: 54 year old female for evaluation of atrial fibrillation. Patient carries the diagnosis of mitral valve prolapse. She typically does not have dyspnea on exertion, orthopnea, PND, pedal edema, syncope or exertional chest pain. She has had occasional palpitations since 1998. She had a prolonged episode in July 2015. Seen in the emergency room and noted to be in atrial fibrillation. Asked to follow-up with cardiology but she did not. She developed recurrent palpitations on January 24 p.m. There is associated chest pressure, fatigue and dyspnea. Her symptoms would not resolve and she presented to the emergency room. She was found to be in atrial fibrillation and admitted. She converted to sinus rhythm. Cardiology asked to evaluate. No further chest pain.  Medications Prior to Admission  Medication Sig Dispense Refill  . ALPRAZolam (XANAX) 0.5 MG tablet Take 0.5 mg by mouth 3 (three) times daily as needed for anxiety or sleep.     Marland Kitchen ALPRAZolam (XANAX) 0.5 MG tablet Take 0.5 mg by mouth at bedtime.    . Dimethyl Fumarate (TECFIDERA) 240 MG CPDR Take 1 tablet by mouth every 12 (twelve) hours.     . gabapentin (NEURONTIN) 600 MG tablet Take 900 mg by mouth 2 (two) times daily. Takes 1.5 tablets at 6pm and 1.5 tablets at 9pm    . [START ON 01/06/2015] HYDROcodone-acetaminophen (NORCO) 7.5-325 MG per tablet Take 1-2 tablets by mouth every 8 (eight) hours as needed. (Patient taking differently: Take 2 tablets by mouth every 8 (eight) hours. ) 180 tablet 0  . [START ON 01/06/2015] methylphenidate (RITALIN) 20 MG tablet Take 1-2 tablets (20-40 mg total) by mouth 2 (two) times daily. 40 mg in the morning and 20 mg with lunch. 90 tablet 0  . metoprolol (LOPRESSOR) 50 MG tablet Take 0.5 tablets (25 mg total) by mouth 2 (two) times daily as needed. (Patient taking differently: Take 25 mg by mouth 2 (two) times daily as needed (racing heartbeat). ) 30 tablet 0  . baclofen (LIORESAL) 10  MG tablet Take 1 tablet (10 mg total) by mouth 3 (three) times daily. 90 each 5    Allergies  Allergen Reactions  . Ibuprofen Other (See Comments)    CAUSES LYMPHEDEMA  . Nsaids Other (See Comments)    AVOIDS ANY SODIUM CONTAINING MED. -- CAUSES LYMPHEDEMA     Past Medical History  Diagnosis Date  . MVP (mitral valve prolapse) HX HEART RACING  YRS AGO    ASYMPTOMATIC SINCE THEN  . Weakness of right leg SECONDARY TO MS  . History of concussion YOUNG ADULT--  NO RESIDUAL  . Headache(784.0)   . History of edema PERIPHERAL LYMPHEDEMA  . Arthritis   . Bone spur   . MS (multiple sclerosis)   . 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 Movement disorder   . Vision abnormalities   . Atrial fibrillation     Past Surgical History  Procedure Laterality Date  . Destruction of anal condyloma  07-07-2005  DR TOTH  . Laparoscopic assisted vaginal hysterectomy  01-15-2002  DR Delfino Lovett [facility]LL[place]/ DR TIM DAVIS    AND REPAIR VENTRAL HERNIA  . Cesarean section  X3    BILATERAL TUBAL LIGATION W/ LAST ONE  . Knee arthroscopy with lateral menisectomy  10/26/2012    Procedure: KNEE ARTHROSCOPY WITH LATERAL MENISECTOMY;  Surgeon: Johnn Hai, MD;  Location: [facility];  Service: Orthopedics;  Laterality: Left;  Left Knee Arthrsocopy with Partial Lateral Menisectomy  .  Laser ablation condolamata N/A 04/26/2013    Procedure: LASER ABLATION CONDOLAMATA;  Surgeon: Leighton Ruff, MD;  Location: [facility];  Service: General;  Laterality: N/A;  . Tonsillectomy      History   Social History  . Marital Status: Married    Spouse Name: N/A    Number of Children: 3  . Years of Education: N/A   Occupational History  . Not on file.   Social History Main Topics  . Smoking status: Never Smoker   . Smokeless tobacco: Never Used  . Alcohol Use: No  . Drug Use: No  . Sexual Activity: Not on file   Other Topics Concern  . Not on file   Social History Narrative     Family History  Problem Relation Age of Onset  . Diabetes Mother   . Hypertension Mother   . Stroke Mother   . Bowel Disease Brother   . Diabetes Brother   . Healthy Brother   . Emphysema Father   . Heart attack Father     ROS:  Some difficulties with right lower extremity weakness from multiple sclerosis and bilateral knee pain but no fevers or chills, productive cough, hemoptysis, dysphasia, odynophagia, melena, hematochezia, dysuria, hematuria, rash, seizure activity, orthopnea, PND, pedal edema, claudication. Remaining systems are negative.  Physical Exam:   Blood pressure 108/63, pulse 60, temperature 97.5 F (36.4 C), temperature source Oral, resp. rate 18, height '5\' 5"'  (1.651 m), weight 163 lb 12.8 oz (74.3 kg), SpO2 98 %.  General:  Well developed/well nourished in NAD Skin warm/dry Patient not depressed No peripheral clubbing Back-normal HEENT-normal/normal eyelids Neck supple/normal carotid upstroke bilaterally; no bruits; no JVD; no thyromegaly chest - CTA/ normal expansion CV - RRR/normal S1 and S2; no murmurs, rubs or gallops;  PMI nondisplaced Abdomen -NT/ND, no HSM, no mass, + bowel sounds, no bruit 2+ femoral pulses, no bruits Ext-no edema, chords, 2+ DP Neuro-grossly nonfocal  ECG 12/28/2014-atrial fibrillation with nonspecific ST changes.  Results for orders placed or performed during the hospital encounter of 12/28/14 (from the past 48 hour(s))  CBC     Status: None   Collection Time: 12/28/14  9:05 PM  Result Value Ref Range   WBC 4.8 4.0 - 10.5 K/uL   RBC 4.62 3.87 - 5.11 MIL/uL   Hemoglobin 14.1 12.0 - 15.0 g/dL   HCT 41.4 36.0 - 46.0 %   MCV 89.6 78.0 - 100.0 fL   MCH 30.5 26.0 - 34.0 pg   MCHC 34.1 30.0 - 36.0 g/dL   RDW 13.0 11.5 - 15.5 %   Platelets 245 150 - 400 K/uL  Basic metabolic panel     Status: Abnormal   Collection Time: 12/28/14  9:05 PM  Result Value Ref Range   Sodium 139 135 - 145 mmol/L   Potassium 3.7 3.5 - 5.1 mmol/L     Chloride 103 96 - 112 mmol/L   CO2 32 19 - 32 mmol/L   Glucose, Bld 132 (H) 70 - 99 mg/dL   BUN 13 6 - 23 mg/dL   Creatinine, Ser 0.89 0.50 - 1.10 mg/dL   Calcium 9.1 8.4 - 10.5 mg/dL   GFR calc non Af Amer 72 (L) >90 mL/min   GFR calc Af Amer 83 (L) >90 mL/min    Comment: (NOTE) The eGFR has been calculated using the CKD EPI equation. This calculation has not been validated in all clinical situations. eGFR's persistently <90 mL/min signify possible Chronic Kidney Disease.  Anion gap 4 (L) 5 - 15  BNP (order ONLY if patient complains of dyspnea/SOB AND you have documented it for THIS visit)     Status: None   Collection Time: 12/28/14  9:05 PM  Result Value Ref Range   B Natriuretic Peptide 86.6 0.0 - 100.0 pg/mL  I-stat troponin, ED (not at [facility])     Status: None   Collection Time: 12/28/14  9:08 PM  Result Value Ref Range   Troponin i, poc 0.00 0.00 - 0.08 ng/mL   Comment 3            Comment: Due to the release kinetics of cTnI, a negative result within the first hours of the onset of symptoms does not rule out myocardial infarction with certainty. If myocardial infarction is still suspected, repeat the test at appropriate intervals.   Troponin I-serum (0, 3, 6 hours)     Status: None   Collection Time: 12/28/14 11:30 PM  Result Value Ref Range   Troponin I <0.03 <0.031 ng/mL    Comment:        NO INDICATION OF MYOCARDIAL INJURY.   Troponin I-serum (0, 3, 6 hours)     Status: None   Collection Time: 12/29/14  3:20 AM  Result Value Ref Range   Troponin I <0.03 <0.031 ng/mL    Comment:        NO INDICATION OF MYOCARDIAL INJURY.   Troponin I-serum (0, 3, 6 hours)     Status: None   Collection Time: 12/29/14  5:25 AM  Result Value Ref Range   Troponin I <0.03 <0.031 ng/mL    Comment:        NO INDICATION OF MYOCARDIAL INJURY.   Troponin I (q 6hr x 3)     Status: None   Collection Time: 12/29/14  7:10 PM  Result Value Ref Range   Troponin I <0.03 <0.031  ng/mL    Comment:        NO INDICATION OF MYOCARDIAL INJURY.   TSH     Status: None   Collection Time: 12/29/14  7:10 PM  Result Value Ref Range   TSH 2.465 0.350 - 4.500 uIU/mL  T4, free     Status: None   Collection Time: 12/29/14  7:10 PM  Result Value Ref Range   Free T4 1.09 0.80 - 1.80 ng/dL    Comment: Performed at [facility]  Troponin I (q 6hr x 3)     Status: None   Collection Time: 12/30/14 12:40 AM  Result Value Ref Range   Troponin I <0.03 <0.031 ng/mL    Comment:        NO INDICATION OF MYOCARDIAL INJURY.   Troponin I (q 6hr x 3)     Status: None   Collection Time: 12/30/14  6:52 AM  Result Value Ref Range   Troponin I <0.03 <0.031 ng/mL    Comment:        NO INDICATION OF MYOCARDIAL INJURY.     Dg Chest Port 1 View  12/28/2014   CLINICAL DATA:  Central chest pain and shortness of breath. Symptoms tonight.  EXAM: PORTABLE CHEST - 1 VIEW  COMPARISON:  06/17/2014  FINDINGS: Cardiac silhouette normal in size and configuration. No mediastinal or hilar masses.  Mild increased markings in the medial lung bases, but no convincing pneumonia and no pulmonary edema. No pleural effusion or pneumothorax.  Bony thorax is unremarkable.  IMPRESSION: No active disease.   Electronically Signed  By: Lajean Manes M.D.   On: 12/28/2014 21:11    Assessment/Plan 1 atrial fibrillation-the patient has diagnosed paroxysmal atrial fibrillation. She has converted to sinus rhythm. TSH normal. Echocardiogram shows normal LV function. Her only embolic risk factor is female sex. CHADS vasc 1. Would recommend treating with aspirin daily. I explained the higher risk of stroke associated with paroxysmal atrial fibrillation. Would add Toprol 25 mg daily for rate control if atrial fibrillation recurs. If she has more frequent episodes in the future we will consider an antiarrhythmic such as flecainide. 2 chest pain-symptoms only occurred with elevated heart rate and atrial fibrillation.  Enzymes negative. Would arrange outpatient lexiscan nuclear study for risk stratification. 3 history of mitral valve prolapse-bowing noted on recent echo. 4 multiple sclerosis-patient takes Ritalin to help stay awake and it also improves her mobility related to multiple sclerosis. This certainly could exacerbate her atrial fibrillation but she feels she needs this for symptomatic relief from MS. Will continue for now.  Patient can be discharged from a cardiac standpoint. Follow-up with me in approximately 4-6 weeks.   Kirk Ruths MD 12/30/2014, 10:10 AM

## 2014-12-30 NOTE — Progress Notes (Signed)
M.D. Please discuss home meds with patient today/

## 2015-01-01 NOTE — Discharge Summary (Signed)
Physician Discharge Summary  [name] ZOX:[id number] DOB: 10-09-1961 DOA: 12/28/2014  PCP: [name], MD  Admit date: 12/28/2014 Discharge date: 01/01/2015  Time spent: 25 min minutes  Recommendations for Outpatient Follow-up:  1. Follow up with PCP in one week.  2. followup with cardiology as recommended.   Discharge Diagnoses:  Active Problems:   Chest pain   Paroxysmal atrial fibrillation multiple sclerosis.   Discharge Condition: improved.   Diet recommendation: regular diet.   Filed Weights   12/28/14 2353 12/29/14 0608 12/30/14 0543  Weight: 69.5 kg (153 lb 3.5 oz) 76.3 kg (168 lb 3.4 oz) 74.3 kg (163 lb 12.8 oz)    History of present illness:  [name] is a 54 y.o. female who presents to the ED with sudden onset of palpitations and chest pain . Patient tried NTG at home which didn't help, and metoprolol at home as well. When EMS got to the patient she was noted to have tachycardia into the 150s was still in the 120s on arrival to ED with chest pain. Apparently she was found to be in A.Fib RVR and she Converted spontaneously into NSR with a rate of 60s-70s and her chest pain has since completely resolved. Cardiology consulted and recommendations given.   Hospital Course:  Chest pain: Admitted to telemetry and evaluate for ACS.  Serial troponins negative.  Echocardiogram ordered  Showed good left ventricular ejection fraction.     ? Paroxysmal afib:  NSR now.  On metoprolol at home as needed. She reports she was anxious and stressed out and felt these episodes come on when she feels stressed out.  Cardiology consulted and started on aspirin.  Procedures:  Echocardiogram.  Consultations:  cardiology  Discharge Exam: Filed Vitals:   12/30/14 1020  BP: 129/72  Pulse: 64  Temp: 97.7 F (36.5 C)  Resp: 18    General: alert afebrile comfortable Cardiovascular: s1s2 Respiratory: ctab  Discharge Instructions   Discharge Instructions     Discharge instructions    Complete by:  As directed   FOLLOW UP with cardiology as recommended.          Discharge Medication List as of 12/30/2014  2:33 PM    START taking these medications   Details  aspirin 81 MG chewable tablet Chew 1 tablet (81 mg total) by mouth daily., Starting 12/30/2014, Until Discontinued, Print    pantoprazole (PROTONIX) 20 MG tablet Take 1 tablet (20 mg total) by mouth daily., Starting 12/30/2014, Until Discontinued, Print      CONTINUE these medications which have CHANGED   Details  metoprolol (LOPRESSOR) 50 MG tablet Take 0.5 tablets (25 mg total) by mouth 2 (two) times daily as needed (racing heartbeat)., Starting 12/30/2014, Until Discontinued, Print      CONTINUE these medications which have NOT CHANGED   Details  !! ALPRAZolam (XANAX) 0.5 MG tablet Take 0.5 mg by mouth 3 (three) times daily as needed for anxiety or sleep. , Until Discontinued, Historical Med    !! ALPRAZolam (XANAX) 0.5 MG tablet Take 0.5 mg by mouth at bedtime., Until Discontinued, Historical Med    Dimethyl Fumarate (TECFIDERA) 240 MG CPDR Take 1 tablet by mouth every 12 (twelve) hours. , Until Discontinued, Historical Med    gabapentin (NEURONTIN) 600 MG tablet Take 900 mg by mouth 2 (two) times daily. Takes 1.5 tablets at 6pm and 1.5 tablets at 9pm, Until Discontinued, Historical Med    HYDROcodone-acetaminophen (NORCO) 7.5-325 MG per tablet Take 1-2 tablets by mouth  every 8 (eight) hours as needed., Starting 01/06/2015, Until Discontinued, Print    methylphenidate (RITALIN) 20 MG tablet Take 1-2 tablets (20-40 mg total) by mouth 2 (two) times daily. 40 mg in the morning and 20 mg with lunch., Starting 01/06/2015, Until Discontinued, Print     !! - Potential duplicate medications found. Please discuss with provider.    STOP taking these medications     baclofen (LIORESAL) 10 MG tablet        Allergies  Allergen Reactions  . Ibuprofen Other (See Comments)    CAUSES  LYMPHEDEMA  . Nsaids Other (See Comments)    AVOIDS ANY SODIUM CONTAINING MED. -- CAUSES LYMPHEDEMA   Follow-up Information    Follow up with [name], MD. Schedule an appointment as soon as possible for a visit in 2 weeks.   Specialty:  Internal Medicine   Contact information:   [address] [place] [address] [phone]       Follow up with Olga Millers, MD. Go on 01/27/2015.   Specialty:  Cardiology   Why:  for  stress test   01/27/15 with Huey Bienenstock, PA @ [address] information:   [address] [place] [address] [phone]        The results of significant diagnostics from this hospitalization (including imaging, microbiology, ancillary and laboratory) are listed below for reference.    Significant Diagnostic Studies: Dg Chest Port 1 View  12/28/2014   CLINICAL DATA:  Central chest pain and shortness of breath. Symptoms tonight.  EXAM: PORTABLE CHEST - 1 VIEW  COMPARISON:  06/17/2014  FINDINGS: Cardiac silhouette normal in size and configuration. No mediastinal or hilar masses.  Mild increased markings in the medial lung bases, but no convincing pneumonia and no pulmonary edema. No pleural effusion or pneumothorax.  Bony thorax is unremarkable.  IMPRESSION: No active disease.   Electronically Signed   By: Amie Portland M.D.   On: 12/28/2014 21:11    Microbiology: No results found for this or any previous visit (from the past 240 hour(s)).   Labs: Basic Metabolic Panel:  Recent Labs Lab 12/25/14 1504 12/28/14 2105  NA 138 139  K 3.8 3.7  CL 100 103  CO2 26 32  GLUCOSE 92 132*  BUN 14 13  CREATININE 0.75 0.89  CALCIUM 9.3 9.1   Liver Function Tests:  Recent Labs Lab 12/25/14 1504  AST 21  ALT 18  ALKPHOS 81  BILITOT 0.5  PROT 6.7  ALBUMIN 4.3   No results for input(s): LIPASE, AMYLASE in the last 168 hours. No results for input(s): AMMONIA in the last 168 hours. CBC:  Recent Labs Lab 12/28/14 2105  WBC  4.8  HGB 14.1  HCT 41.4  MCV 89.6  PLT 245   Cardiac Enzymes:  Recent Labs Lab 12/29/14 0320 12/29/14 0525 12/29/14 1910 12/30/14 0040 12/30/14 0652  TROPONINI <0.03 <0.03 <0.03 <0.03 <0.03   BNP: BNP (last 3 results) No results for input(s): PROBNP in the last 8760 hours. CBG: No results for input(s): GLUCAP in the last 168 hours.     SignedKathlen Mody  [facility] 01/01/2015, 1:46 PM

## 2015-01-13 ENCOUNTER — Ambulatory Visit: Payer: Self-pay | Admitting: Neurology

## 2015-01-19 ENCOUNTER — Ambulatory Visit: Payer: 59 | Admitting: Internal Medicine

## 2015-01-26 ENCOUNTER — Telehealth: Payer: Self-pay | Admitting: Physician Assistant

## 2015-01-27 ENCOUNTER — Ambulatory Visit: Payer: 59 | Admitting: Physician Assistant

## 2015-01-27 NOTE — Telephone Encounter (Signed)
Close encounter 

## 2015-01-28 ENCOUNTER — Ambulatory Visit (INDEPENDENT_AMBULATORY_CARE_PROVIDER_SITE_OTHER): Payer: 59 | Admitting: Internal Medicine

## 2015-01-28 ENCOUNTER — Encounter: Payer: Self-pay | Admitting: Internal Medicine

## 2015-01-28 VITALS — BP 120/82 | HR 69 | Temp 97.6°F | Wt 166.0 lb

## 2015-01-28 DIAGNOSIS — G35 Multiple sclerosis: Secondary | ICD-10-CM

## 2015-01-28 DIAGNOSIS — J069 Acute upper respiratory infection, unspecified: Secondary | ICD-10-CM

## 2015-01-28 DIAGNOSIS — I480 Paroxysmal atrial fibrillation: Secondary | ICD-10-CM

## 2015-01-28 MED ORDER — AZITHROMYCIN 250 MG PO TABS: ORAL_TABLET | ORAL | Status: DC

## 2015-01-28 NOTE — Progress Notes (Signed)
   Subjective:    Patient ID: [name], female    DOB: 1961-05-01, 54 y.o.   MRN: [id number]  HPI 54 year old White Female with multiple sclerosis was in the hospital recently with paroxysmal atrial fibrillation. She has appointment in early March to see cardiologist. She has Lopressor to take should she have repeat occurrence. Has history of mitral valve prolapse. She is currently not taking her Ritalin. She looks tired groggy and speaks very slowly today. She is afraid Ritalin will caused arrhythmia. Husband was hospitalized recently with a TIA and she got run down and came down with a respiratory infection. She has had some cough but no sputum production. Has malaise and fatigue. Started out with sore throat.  She wants to talk about MS symptoms. She's had some upper body musculoskeletal type pain. If she takes baclofen, it makes her very drowsy. She also has gabapentin to take. She probably should discuss pain management further with her neurologist. It's possible she could cut baclofen and one half or one quarter to get some relief. She used to take hydrocodone/APAP 5/500 or 5/650 and that is no longer available. She thought the Tylenol helped her pain.  She has questions about her arrhythmia. She had consumed a cappuccino with caffeine the day it started.  She was here recently for preop clearance for a knee replacement and then subsequently had the paroxysmal atrial fibrillation that resulted in hospitalization so that surgery has been postponed pending cardiology evaluation    Review of Systems     Objective:   Physical Exam  Skin is warm and dry. She looks fatigued. She speaks slowly but clearly. Thought seemed to help around a bit. Pharynx is only slightly injected. Left TM clear. Right TM obscured by cerumen. Neck is supple without adenopathy. Chest clear to auscultation without rales or wheezing. Cardiac exam regular rate and rhythm normal S1 and S2. No ectopy.        Assessment & Plan:  Acute URI  Multiple sclerosis  Paroxysmal atrial fibrillation-has Lopressor to take if symptoms recur  Plan: Zithromax Z-PAK take 2 tablets day one followed by 1 tablet days 2 through 5. Call if not better in 5-7 days or sooner if worse. Cardiology consultation scheduled for early March.  25 minutes spent with patient

## 2015-02-09 ENCOUNTER — Other Ambulatory Visit: Payer: Self-pay | Admitting: Neurology

## 2015-02-09 MED ORDER — HYDROCODONE-ACETAMINOPHEN 7.5-325 MG PO TABS: 1.0000 | ORAL_TABLET | Freq: Three times a day (TID) | ORAL | Status: DC | PRN

## 2015-02-09 NOTE — Telephone Encounter (Signed)
Patient requesting refill for Rx HYDROcodone-acetaminophen (NORCO) 7.5-325 MG per tablet.  Please call when ready for pick up. ° ° °

## 2015-02-09 NOTE — Telephone Encounter (Signed)
Request entered, forwarded to provider for approval.  

## 2015-02-10 ENCOUNTER — Telehealth: Payer: Self-pay

## 2015-02-10 NOTE — Telephone Encounter (Signed)
Called patient and informed Rx ready for pick up at front desk. Patient verbalized understanding. Patient states spouse will p/u Rx for her.

## 2015-02-12 ENCOUNTER — Ambulatory Visit (INDEPENDENT_AMBULATORY_CARE_PROVIDER_SITE_OTHER): Payer: 59 | Admitting: Cardiology

## 2015-02-12 ENCOUNTER — Encounter: Payer: Self-pay | Admitting: Cardiology

## 2015-02-12 VITALS — BP 132/90 | HR 82 | Ht 65.0 in | Wt 170.2 lb

## 2015-02-12 DIAGNOSIS — I499 Cardiac arrhythmia, unspecified: Secondary | ICD-10-CM

## 2015-02-12 DIAGNOSIS — I480 Paroxysmal atrial fibrillation: Secondary | ICD-10-CM

## 2015-02-12 DIAGNOSIS — M1990 Unspecified osteoarthritis, unspecified site: Secondary | ICD-10-CM | POA: Insufficient documentation

## 2015-02-12 DIAGNOSIS — M8949 Other hypertrophic osteoarthropathy, multiple sites: Secondary | ICD-10-CM

## 2015-02-12 DIAGNOSIS — M159 Polyosteoarthritis, unspecified: Secondary | ICD-10-CM

## 2015-02-12 DIAGNOSIS — M150 Primary generalized (osteo)arthritis: Secondary | ICD-10-CM

## 2015-02-12 NOTE — Assessment & Plan Note (Signed)
No further palpitations

## 2015-02-12 NOTE — Assessment & Plan Note (Signed)
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 She sees Dr Ethelene Hal for injections and Dr Jillyn Hidden for her knee.

## 2015-02-12 NOTE — Patient Instructions (Signed)
Your physician has requested that you have a lexiscan myoview. For further information please visit [email or web address] Please follow instruction sheet, as given.  Your physician recommends that you return for lab work in: Have this drawn the day you come for the Stress Test on the first floor at [facility].  Your physician recommends that you schedule a follow-up appointment in: 4 weeks with Dr. Jens Som.

## 2015-02-12 NOTE — Progress Notes (Signed)
02/12/2015 [name]   22-Aug-1961  [id number]  Primary Physician [name], MD Primary Cardiologist: Dr Jens Som  HPI:  Pleasant 54 y/o female, former dance instruct er, with a history of multiple sclerosis and DJD. She was seen at [facility] recently when she presented with PAF after taking cold medicine and caffeine. She converted spontaneously to NSR. Echo was normal.   prescribed Lopressor prn and ASA (CHADs 1). She is her today for follow up. She denies any recurrent palpitations. She says she just came from Dr Ramos's office after shew had an injection. She says she needs a Lt TKR "soon".             She is not taking ASA. She tells me she has "swelling" even with a baby aspirin, mainly in her feet. As a child she says she was told she was a "free bleeder" and actually had to wear a bracelet stating this.    Current Outpatient Prescriptions  Medication Sig Dispense Refill  . ALPRAZolam (XANAX) 0.5 MG tablet Take 0.5 mg by mouth 3 (three) times daily as needed for anxiety or sleep.     . Dimethyl Fumarate (TECFIDERA) 240 MG CPDR Take 1 tablet by mouth every 12 (twelve) hours.     . gabapentin (NEURONTIN) 600 MG tablet Take 900 mg by mouth 2 (two) times daily. Takes 1.5 tablets at 6pm and 1.5 tablets at 9pm    . HYDROcodone-acetaminophen (NORCO) 7.5-325 MG per tablet Take 1-2 tablets by mouth every 8 (eight) hours as needed. 180 tablet 0  . methylphenidate (RITALIN) 20 MG tablet Take 1-2 tablets (20-40 mg total) by mouth 2 (two) times daily. 40 mg in the morning and 20 mg with lunch. 90 tablet 0  . metoprolol (LOPRESSOR) 50 MG tablet Take 0.5 tablets (25 mg total) by mouth 2 (two) times daily as needed (racing heartbeat). 30 tablet 0  . pantoprazole (PROTONIX) 20 MG tablet Take 1 tablet (20 mg total) by mouth daily. 30 tablet 1   No current facility-administered medications for this visit.    Allergies  Allergen Reactions  . Ibuprofen Other (See Comments)    CAUSES LYMPHEDEMA  .  Nsaids Other (See Comments)    AVOIDS ANY SODIUM CONTAINING MED. -- CAUSES LYMPHEDEMA    History   Social History  . Marital Status: Married    Spouse Name: N/A  . Number of Children: 3  . Years of Education: N/A   Occupational History  . Not on file.   Social History Main Topics  . Smoking status: Never Smoker   . Smokeless tobacco: Never Used  . Alcohol Use: No  . Drug Use: No  . Sexual Activity: Not on file   Other Topics Concern  . Not on file   Social History Narrative     Review of Systems: General: negative for chills, fever, night sweats or weight changes.  Cardiovascular: negative for chest pain, dyspnea on exertion, edema, orthopnea, palpitations, paroxysmal nocturnal dyspnea or shortness of breath Dermatological: negative for rash Respiratory: negative for cough or wheezing Urologic: negative for hematuria Abdominal: negative for nausea, vomiting, diarrhea, bright red blood per rectum, melena, or hematemesis Neurologic: negative for visual changes, syncope, or dizziness All other systems reviewed and are otherwise negative except as noted above.    Blood pressure 132/90, pulse 82, height  (1.651 m), weight 170 lb 3.2 oz (77.202 kg).  General appearance: alert, cooperative and no distress Neck: no carotid bruit and no JVD  Lungs: clear to auscultation bilaterally Heart: regular rate and rhythm Extremities: no edema Pulses: 2+ and symmetric Skin: Skin color, texture, turgor normal. No rashes or lesions Neurologic: Grossly normal, she drags her Rt leg a little secondary to MS   ASSESSMENT AND PLAN:   Paroxysmal atrial fibrillation No further palpitations  Multiple sclerosis . 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 She sees Dr Ethelene Hal for injections and Dr Jillyn Hidden for her knee.   PLAN  We talked about stroke risk and ASA vs other therapies. She does not want to take an ASA  at this time and I doubt she would consider a NOAC. Dr Jens Som had recommended getting a Myoview and this will be scheduled. F/U with him in 4 weeks. I did order a TSH to be drawn when she comes in for her Myoview.  [facility] 02/12/2015 2:34 PM

## 2015-02-19 ENCOUNTER — Encounter (HOSPITAL_COMMUNITY): Payer: 59

## 2015-02-20 ENCOUNTER — Ambulatory Visit: Payer: 59 | Admitting: Physician Assistant

## 2015-02-20 ENCOUNTER — Encounter (HOSPITAL_COMMUNITY): Payer: 59

## 2015-02-24 ENCOUNTER — Telehealth (HOSPITAL_COMMUNITY): Payer: Self-pay

## 2015-02-24 ENCOUNTER — Telehealth: Payer: Self-pay | Admitting: Cardiology

## 2015-02-24 NOTE — Telephone Encounter (Signed)
Encounter complete. 

## 2015-02-24 NOTE — Telephone Encounter (Signed)
Spoke with linda, aware we can see patient labs through EPIC.

## 2015-02-24 NOTE — Telephone Encounter (Signed)
She called and said the pt has an appointment with Dr Jens Som in April. Pt sated that we need her recent lab. She wanted you to know it is in Epic .I think the scheduler told her this and not you.

## 2015-02-26 ENCOUNTER — Other Ambulatory Visit: Payer: Self-pay | Admitting: Neurology

## 2015-02-26 ENCOUNTER — Ambulatory Visit (HOSPITAL_COMMUNITY)
Admission: RE | Admit: 2015-02-26 | Discharge: 2015-02-26 | Disposition: A | Discharge status: Home / Self Care | Payer: 59 | Source: Ambulatory Visit | Attending: Cardiology | Admitting: Cardiology

## 2015-02-26 DIAGNOSIS — Z8249 Family history of ischemic heart disease and other diseases of the circulatory system: Secondary | ICD-10-CM | POA: Insufficient documentation

## 2015-02-26 DIAGNOSIS — I341 Nonrheumatic mitral (valve) prolapse: Secondary | ICD-10-CM | POA: Insufficient documentation

## 2015-02-26 DIAGNOSIS — I480 Paroxysmal atrial fibrillation: Secondary | ICD-10-CM | POA: Diagnosis not present

## 2015-02-26 DIAGNOSIS — Z01810 Encounter for preprocedural cardiovascular examination: Secondary | ICD-10-CM | POA: Diagnosis not present

## 2015-02-26 DIAGNOSIS — I499 Cardiac arrhythmia, unspecified: Secondary | ICD-10-CM | POA: Diagnosis not present

## 2015-02-26 DIAGNOSIS — R5383 Other fatigue: Secondary | ICD-10-CM | POA: Diagnosis not present

## 2015-02-26 MED ORDER — AMINOPHYLLINE 25 MG/ML IV SOLN
75.0000 mg | Freq: Once | INTRAVENOUS | Status: AC
  Administered 2015-02-26: 75 mg via INTRAVENOUS

## 2015-02-26 MED ORDER — TECHNETIUM TC 99M SESTAMIBI GENERIC - CARDIOLITE
10.9000 | Freq: Once | INTRAVENOUS | Status: AC | PRN
  Administered 2015-02-26: 10.9 via INTRAVENOUS

## 2015-02-26 MED ORDER — TECHNETIUM TC 99M SESTAMIBI GENERIC - CARDIOLITE
31.1000 | Freq: Once | INTRAVENOUS | Status: AC | PRN
  Administered 2015-02-26: 31.1 via INTRAVENOUS

## 2015-02-26 MED ORDER — REGADENOSON 0.4 MG/5ML IV SOLN
0.4000 mg | Freq: Once | INTRAVENOUS | Status: AC
  Administered 2015-02-26: 0.4 mg via INTRAVENOUS

## 2015-02-26 MED ORDER — METHYLPHENIDATE HCL 20 MG PO TABS: ORAL_TABLET | ORAL | Status: DC

## 2015-02-26 NOTE — Telephone Encounter (Signed)
Patient is calling to get a written Rx for methylphenidate (RITALIN) 20 MG tablet. Please call patient when ready for pickup. Thank you.

## 2015-02-26 NOTE — Telephone Encounter (Signed)
Dr Epimenio Foot is out of the office.  Forwarding to [facility] for review.

## 2015-02-26 NOTE — Procedures (Addendum)
[facility] [address] [place] [address] [place] [address] [phone]  Cardiology Nuclear Med Study  [name] is a 54 y.o. female     MRN : [id number]     DOB: [birth year]  Procedure Date: 02/26/2015  Nuclear Med Background Indication for Stress Test:  Evaluation for Ischemia, Surgical Clearance and Post Hospital History:  MVP and AFIB;MS Cardiac Risk Factors: Family History - CAD  Symptoms:  Fatigue and Palpitations   Nuclear Pre-Procedure Caffeine/Decaff Intake:  7:00pm NPO After: 3:00am   IV Site: R Forearm  IV 0.9% NS with Angio Cath:  22g  Chest Size (in):  N/A IV Started by: Berdie Ogren, RN  Height: 5\' 5"  (1.651 m)  Cup Size: B  BMI:  Body mass index is 28.29 kg/(m^2). Weight:  170 lb (77.111 kg)   Tech Comments:  n/a    Nuclear Med Study 1 or 2 day study: 1 day  Stress Test Type:  Lexiscan  Order Authorizing Provider:  Olga Millers, MD   Resting Radionuclide: Technetium 35m Sestamibi  Resting Radionuclide Dose: 10.9 mCi   Stress Radionuclide:  Technetium 12m Sestamibi  Stress Radionuclide Dose: 31.1 mCi           Stress Protocol Rest HR: 74 Stress HR: 93  Rest BP: 136/98 Stress BP: 162/85  Exercise Time (min): n/a METS: n/a          Dose of Adenosine (mg):  n/a Dose of Lexiscan: 0.4 mg  Dose of Atropine (mg): n/a Dose of Dobutamine: n/a mcg/kg/min (at max HR)  Stress Test Technologist: Esperanza Sheets, CCT Nuclear Technologist: Gonzella Lex, CNMT   Rest Procedure:  Myocardial perfusion imaging was performed at rest 45 minutes following the intravenous administration of Technetium 71m Sestamibi. Stress Procedure:  The patient received IV Lexiscan 0.4 mg over 15-seconds.  Technetium 42m Sestamibi injected IV at 30-seconds.  Patient experienced SOB, chest heaviness and nausea and 75 mg Aminophylline IV was administered.  There were no significant changes with Lexiscan.  Quantitative spect images were  obtained after a 45 minute delay.  Transient Ischemic Dilatation (Normal <1.22):  1.18  QGS EDV:  119 ml QGS ESV:  54 ml LV Ejection Fraction: 55%       Rest ECG: NSR - Normal EKG  Stress ECG: No significant change from baseline ECG  QPS Raw Data Images:  Normal; no motion artifact; normal heart/lung ratio. Stress Images:  Normal homogeneous uptake in all areas of the myocardium. Rest Images:  Normal homogeneous uptake in all areas of the myocardium. Subtraction (SDS):  No evidence of ischemia.  Impression Exercise Capacity:  Lexiscan with no exercise. BP Response:  Normal blood pressure response. Clinical Symptoms:  Mild chest pain/dyspnea. ECG Impression:  No significant ST segment change suggestive of ischemia. Comparison with Prior Nuclear Study: No images to compare  Overall Impression:  Normal stress nuclear study.  LV Wall Motion:  NL LV Function; NL Wall Motion   Runell Gess, MD  02/27/2015 8:54 AM

## 2015-02-27 LAB — TSH: TSH: 2.43 u[IU]/mL (ref 0.350–4.500)

## 2015-03-09 ENCOUNTER — Telehealth: Payer: Self-pay | Admitting: Neurology

## 2015-03-09 MED ORDER — HYDROCODONE-ACETAMINOPHEN 7.5-325 MG PO TABS: 1.0000 | ORAL_TABLET | Freq: Three times a day (TID) | ORAL | Status: DC | PRN

## 2015-03-09 NOTE — Telephone Encounter (Signed)
Pt is calling wanting to know if she can get her Rx for HYDROcodone-acetaminophen (NORCO) 7.5-325 MG per tablet post dated so she can pick it up early because she is going out of town and she is going to run out.  Please call and advise.

## 2015-03-09 NOTE — Telephone Encounter (Signed)
Hydrocodone due 03-11-15.  Spoke with [facility] and advised post-dated rx. will be ready to be p/u at [facility] tomorrow.  She verbalized understanding of same.   Rx. printed, signed, up front [facility] for her to pick up/fim

## 2015-03-17 NOTE — Progress Notes (Signed)
HPI: follow-up atrial fibrillation. Patient had episode of atrial fibrillation in July 2015 and recurrent episode January 2016. Echocardiogram January 2016 showed normal LV function and mild left atrial enlargement. Nuclear study March 2016 showed ejection fraction 55% and normal perfusion. TSH March 2016 2.430. Since last seen, the patient denies any dyspnea on exertion, orthopnea, PND, pedal edema, palpitations, syncope or chest pain.   Current Outpatient Prescriptions  Medication Sig Dispense Refill  . ALPRAZolam (XANAX) 0.5 MG tablet Take 0.5 mg by mouth 3 (three) times daily as needed for anxiety or sleep.     . Dimethyl Fumarate (TECFIDERA) 240 MG CPDR Take 1 tablet by mouth every 12 (twelve) hours.     . gabapentin (NEURONTIN) 600 MG tablet Take 900 mg by mouth 2 (two) times daily. Takes 1.5 tablets at 6pm and 1.5 tablets at 9pm    . HYDROcodone-acetaminophen (NORCO) 7.5-325 MG per tablet Take 1-2 tablets by mouth every 8 (eight) hours as needed. 180 tablet 0  . methylphenidate (RITALIN) 20 MG tablet 40 mg in the morning and 20 mg with lunch. 90 tablet 0  . metoprolol (LOPRESSOR) 50 MG tablet Take 0.5 tablets (25 mg total) by mouth 2 (two) times daily as needed (racing heartbeat). 30 tablet 0   No current facility-administered medications for this visit.     Past Medical History  Diagnosis Date  . MVP (mitral valve prolapse) HX HEART RACING  YRS AGO    ASYMPTOMATIC SINCE THEN  . Weakness of right leg SECONDARY TO MS  . History of concussion YOUNG ADULT--  NO RESIDUAL  . Headache(784.0)   . History of edema PERIPHERAL LYMPHEDEMA  . Arthritis   . Bone spur   . MS (multiple sclerosis)   . 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 Movement disorder   . Vision abnormalities   . Atrial fibrillation     Past Surgical History  Procedure Laterality Date  . Destruction of anal condyloma  07-07-2005  DR TOTH  . Laparoscopic assisted vaginal hysterectomy  01-15-2002  DR  Gerlene Burdock [place]LL[place]/ DR TIM DAVIS    AND REPAIR VENTRAL HERNIA  . Cesarean section  X3    BILATERAL TUBAL LIGATION W/ LAST ONE  . Knee arthroscopy with lateral menisectomy  10/26/2012    Procedure: KNEE ARTHROSCOPY WITH LATERAL MENISECTOMY;  Surgeon: Javier Docker, MD;  Location: [facility] [place];  Service: Orthopedics;  Laterality: Left;  Left Knee Arthrsocopy with Partial Lateral Menisectomy  . Laser ablation condolamata N/A 04/26/2013    Procedure: LASER ABLATION CONDOLAMATA;  Surgeon: Romie Levee, MD;  Location: [facility];  Service: General;  Laterality: N/A;  . Tonsillectomy      History   Social History  . Marital Status: Married    Spouse Name: N/A  . Number of Children: 3  . Years of Education: N/A   Occupational History  . Not on file.   Social History Main Topics  . Smoking status: Never Smoker   . Smokeless tobacco: Never Used  . Alcohol Use: No  . Drug Use: No  . Sexual Activity: Not on file   Other Topics Concern  . Not on file   Social History Narrative    ROS: no fevers or chills, productive cough, hemoptysis, dysphasia, odynophagia, melena, hematochezia, dysuria, hematuria, rash, seizure activity, orthopnea, PND, pedal edema, claudication. Remaining systems are negative.  Physical Exam: Well-developed well-nourished in no acute distress.  Skin is warm and  dry.  HEENT is normal.  Neck is supple.  Chest is clear to auscultation with normal expansion.  Cardiovascular exam is regular rate and rhythm.  Abdominal exam nontender or distended. No masses palpated. Extremities show no edema. neuro grossly intact

## 2015-03-20 ENCOUNTER — Ambulatory Visit (INDEPENDENT_AMBULATORY_CARE_PROVIDER_SITE_OTHER): Payer: 59 | Admitting: Cardiology

## 2015-03-20 ENCOUNTER — Encounter: Payer: Self-pay | Admitting: Cardiology

## 2015-03-20 VITALS — BP 118/80 | HR 88 | Ht 65.0 in | Wt 167.3 lb

## 2015-03-20 DIAGNOSIS — I480 Paroxysmal atrial fibrillation: Secondary | ICD-10-CM | POA: Diagnosis not present

## 2015-03-20 DIAGNOSIS — Z01810 Encounter for preprocedural cardiovascular examination: Secondary | ICD-10-CM

## 2015-03-20 DIAGNOSIS — R072 Precordial pain: Secondary | ICD-10-CM

## 2015-03-20 NOTE — Assessment & Plan Note (Signed)
Continue metoprolol.we'll consider addition of antiarrhythmic in the future if symptoms become more frequent. Most likely flecainide. CHADsvasc 1 for female sex. Recommended aspirin but she declined. Would not treat with coumadin or NOAC.

## 2015-03-20 NOTE — Patient Instructions (Signed)
Your physician wants you to follow-up in: 6 MONTHS WITH DR CRENSHAW You will receive a reminder letter in the mail two months in advance. If you don't receive a letter, please call our office to schedule the follow-up appointment.  

## 2015-03-20 NOTE — Assessment & Plan Note (Signed)
Patient may require knee replacement. She could proceed without further evaluation from a cardiac standpoint.she would be at increased risk for postoperative atrial fibrillation. Would treat with beta blockade pre-and postoperatively.

## 2015-03-20 NOTE — Assessment & Plan Note (Signed)
Nuclear study negative.no further workup.

## 2015-03-24 ENCOUNTER — Ambulatory Visit (INDEPENDENT_AMBULATORY_CARE_PROVIDER_SITE_OTHER): Payer: 59 | Admitting: Neurology

## 2015-03-24 ENCOUNTER — Encounter: Payer: Self-pay | Admitting: Neurology

## 2015-03-24 VITALS — BP 148/72 | HR 72 | Resp 14 | Ht 65.0 in | Wt 169.6 lb

## 2015-03-24 DIAGNOSIS — R5382 Chronic fatigue, unspecified: Secondary | ICD-10-CM | POA: Diagnosis not present

## 2015-03-24 DIAGNOSIS — F988 Other specified behavioral and emotional disorders with onset usually occurring in childhood and adolescence: Secondary | ICD-10-CM

## 2015-03-24 DIAGNOSIS — F4323 Adjustment disorder with mixed anxiety and depressed mood: Secondary | ICD-10-CM

## 2015-03-24 DIAGNOSIS — F909 Attention-deficit hyperactivity disorder, unspecified type: Secondary | ICD-10-CM | POA: Diagnosis not present

## 2015-03-24 DIAGNOSIS — R3911 Hesitancy of micturition: Secondary | ICD-10-CM

## 2015-03-24 DIAGNOSIS — G35 Multiple sclerosis: Secondary | ICD-10-CM

## 2015-03-24 DIAGNOSIS — R269 Unspecified abnormalities of gait and mobility: Secondary | ICD-10-CM

## 2015-03-24 MED ORDER — HYDROCODONE-ACETAMINOPHEN 7.5-325 MG PO TABS: 1.0000 | ORAL_TABLET | Freq: Three times a day (TID) | ORAL | Status: DC | PRN

## 2015-03-24 MED ORDER — METHYLPHENIDATE HCL 20 MG PO TABS: ORAL_TABLET | ORAL | Status: DC

## 2015-03-24 MED ORDER — ALPRAZOLAM 0.5 MG PO TABS: 0.5000 mg | ORAL_TABLET | Freq: Three times a day (TID) | ORAL | Status: DC | PRN

## 2015-03-24 NOTE — Progress Notes (Signed)
[facility]  PATIENT: [name] DOB: Apr 25, 1961 REASON FOR VISIT: Multiple sclerosis   HISTORICAL  CHIEF COMPLAINT:  Chief Complaint  Patient presents with  . Multiple Sclerosis    Sts. she continues to tolerate Tecfidera well.  Sts. weakness is about the same--still worse in the right leg.  She questions if weakness is due to MS or spinal stenosis.  Sts. neck is ok--tpi's at last ov didn't help much, but Dr. Ethelene Hal is doing another type of injection that she says helps.  Since last ov, she reports being dx. with a-fib. (in addition to mvp).  Sts. she continues to have bilat knee pain--has seen ortho in the past and knows she needs bilat knee replacements. Sts. cardiology has  . Extremity Weakness    cleared her for surgery, so she is planning on following up with ortho soon to plan surgery/fim    HISTORY OF PRESENT ILLNESS:  [name] is a 54 year old woman who was diagnosed with multiple sclerosis in 2008 when she presented with weakness and fatigue.   She feels her MS has been mostly stable since her last visit.   She tolerates Tecfidera well and she has not had any major exacerbation while she has been on it.   She was recently diagnosed with AFib.     MS History:  In 2008, she had an MRI of the brain performed which was consistent with multiple sclerosis and she was diagnosed with relapsing remitting MS. He was initially placed on Avonex for therapy. She had some exacerbations with weakness and clumsiness with her gait and she started to see Dr. Leotis Shames. He placed her on Tysabri she is on Tysabri between 2010 and 2013. 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   Gait/Strength/sensation:   She has some gait ataxia and reports right greater than left leg clumsiness. She has fallen a few times in the past but none recently.   She occasionally uses a cane, mostly when arthritic  issues act up.      Bladder:   She has bladder dysfunction that is likely related to the MS.  Tamsulosin was poorly tolerated and she just took a couple days. She sees Dr. Marlou Porch and therapy was recommended but she has not yet started.   Vision:   One of her exacerbations early on included diplopia as well her symptoms. She continues to note occasional diplopia, especially when she is tired or with gabapentin.  Fatigue/sleep:   She reports difficulties with fatigue. She notes that the fatigue is both physical and cognitive. She has some difficulties with attention. She feels both physical and cognitive fatigue and her attentional problems improved when she takes Ritalin. She is currently on the Ritalin 20 mg by mouth 3 times a day.   Mood/cognition:  She has had depression in the past but is doing much better this year. She continues to have anxiety. She takes Xanax more for pain and spasticity than for her anxiety.    She feels cognitio is baseline.   She has some word finding problems and decreased attention.   Methylphenidate has helped a lot.    Pain:   She has pain but this is likely multifactorial. Some of her pain sounds more dysesthetic in the legs and likely has an element of contribution from the MS. However, she also has degenerative changes in her neck and her  lumbar spine causing pain with spinal stenosis. She had an epidural steroid injection in both the cervical and lumbar spine in January and feels a little better.   Flexeril was not well tolerated and caused her to have a hangover throughout the morning when she took it only at night.   She takes hydrocodone  and gabapentin at night with benefit.   She also has bad knees and may need TKR soon.    REVIEW OF SYSTEMS:  Constitutional: No fevers, chills, sweats, or change in appetite.  She notes fatigue Eyes: No visual changes,eye pain.   She has intermittent diplopia Ear, nose and throat: No hearing loss, ear pain, nasal congestion,  sore throat Cardiovascular: No chest pain, palpitations Respiratory:  No shortness of breath at rest or with exertion.   No wheezes GastrointestinaI: No nausea, vomiting, diarrhea, abdominal pain, fecal incontinence Genitourinary:  a sabove Musculoskeletal:  she has both neck pain and back pain. Also bilat knee pain Integumentary: No rash, pruritus, skin lesions.   She has Raynauds in her hands  Neurological: as above Psychiatric: No depression at this time.  Anxiety doing ok Endocrine: No palpitations, diaphoresis, change in appetite, change in weigh or increased thirst Hematologic/Lymphatic:  No anemia, purpura, petechiae. Allergic/Immunologic: No itchy/runny eyes, nasal congestion, recent allergic reactions, rashes  ALLERGIES: Allergies  Allergen Reactions  . Ibuprofen Other (See Comments)    CAUSES LYMPHEDEMA  . Nsaids Other (See Comments)    AVOIDS ANY SODIUM CONTAINING MED. -- CAUSES LYMPHEDEMA    HOME MEDICATIONS: Outpatient Prescriptions Prior to Visit  Medication Sig Dispense Refill  . ALPRAZolam (XANAX) 0.5 MG tablet Take 0.5 mg by mouth 3 (three) times daily as needed for anxiety or sleep.     . Dimethyl Fumarate (TECFIDERA) 240 MG CPDR Take 1 tablet by mouth every 12 (twelve) hours.     . gabapentin (NEURONTIN) 600 MG tablet Take 900 mg by mouth 2 (two) times daily. Takes 1.5 tablets at 6pm and 1.5 tablets at 9pm    . HYDROcodone-acetaminophen (NORCO) 7.5-325 MG per tablet Take 1-2 tablets by mouth every 8 (eight) hours as needed. 180 tablet 0  . methylphenidate (RITALIN) 20 MG tablet 40 mg in the morning and 20 mg with lunch. 90 tablet 0  . metoprolol (LOPRESSOR) 50 MG tablet Take 0.5 tablets (25 mg total) by mouth 2 (two) times daily as needed (racing heartbeat). 30 tablet 0   No facility-administered medications prior to visit.    PAST MEDICAL HISTORY: Past Medical History  Diagnosis Date  . MVP (mitral valve prolapse) HX HEART RACING  YRS AGO    ASYMPTOMATIC  SINCE THEN  . Weakness of right leg SECONDARY TO MS  . History of concussion YOUNG ADULT--  NO RESIDUAL  . Headache(784.0)   . History of edema PERIPHERAL LYMPHEDEMA  . Arthritis   . Bone spur   . MS (multiple sclerosis)   . 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 Movement disorder   . Vision abnormalities   . Atrial fibrillation     PAST SURGICAL HISTORY: Past Surgical History  Procedure Laterality Date  . Destruction of anal condyloma  07-07-2005  DR TOTH  . Laparoscopic assisted vaginal hysterectomy  01-15-2002  DR Gerlene Burdock [facility]/ DR TIM DAVIS    AND REPAIR VENTRAL HERNIA  . Cesarean section  X3    BILATERAL TUBAL LIGATION W/ LAST ONE  . Knee arthroscopy with lateral menisectomy  10/26/2012    Procedure: KNEE ARTHROSCOPY  WITH LATERAL MENISECTOMY;  Surgeon: Javier Docker, MD;  Location: [facility];  Service: Orthopedics;  Laterality: Left;  Left Knee Arthrsocopy with Partial Lateral Menisectomy  . Laser ablation condolamata N/A 04/26/2013    Procedure: LASER ABLATION CONDOLAMATA;  Surgeon: Romie Levee, MD;  Location: [facility];  Service: General;  Laterality: N/A;  . Tonsillectomy      FAMILY HISTORY: Family History  Problem Relation Age of Onset  . Diabetes Mother   . Hypertension Mother   . Stroke Mother   . Bowel Disease Brother   . Diabetes Brother   . Healthy Brother   . Emphysema Father   . Heart attack Father     SOCIAL HISTORY:  History   Social History  . Marital Status: Married    Spouse Name: N/A  . Number of Children: 3  . Years of Education: N/A   Occupational History  . Not on file.   Social History Main Topics  . Smoking status: Never Smoker   . Smokeless tobacco: Never Used  . Alcohol Use: No  . Drug Use: No  . Sexual Activity: Not on file   Other Topics Concern  . Not on file   Social History Narrative     PHYSICAL EXAM  Filed Vitals:   03/24/15 1529  BP: 148/72  Pulse: 72  Resp:  14  Height:  (1.651 m)  Weight: 169 lb 9.6 oz (76.93 kg)    Body mass index is 28.22 kg/(m^2).   General: The patient is well-developed and well-nourished and in no acute distress  Neck: The neck is supple, no carotid bruits are noted.  The neck is nontender.  Cardiovascular: The cardiovascular examination reveals a regular rate and rhythm, no murmurs, gallops or rubs are noted.  Skin: Extremities are without significant edema.  Neurologic Exam  Mental status: The patient is alert and oriented x 3 at the time of the examination. The patient has apparent normal recent and remote memory, with an apparently normal attention span and concentration ability.   Speech is normal.  Cranial nerves: Extraocular movements are full.   Facial symmetry is present. There is good facial sensation to soft touch bilaterally.Facial strength is normal.  Trapezius and sternocleidomastoid strength is normal. No dysarthria is noted.  The tongue is midline, and the patient has symmetric elevation of the soft palate.   Motor:  Muscle bulk is normal and tone is minimally increased in both legs. Strength is  5 / 5 in all 4 extremities.   Sensory: Sensory testing is intact to pinprick, soft touch, vibration sensation, and position sense on all 4 extremities.  Coordination: Cerebellar testing reveals good finger-nose-finger bilaterally.  Gait and station: Station is normal and gait is arthritic. Tandem gait is wide. Romberg is negative.   Reflexes: Deep tendon reflexes are symmetric and brisk bilaterally.      DIAGNOSTIC DATA (LABS, IMAGING, TESTING) - I reviewed patient records, labs, notes, testing and imaging myself where available.  Lab Results  Component Value Date   WBC 4.8 12/28/2014   HGB 14.1 12/28/2014   HCT 41.4 12/28/2014   MCV 89.6 12/28/2014   PLT 245 12/28/2014      Component Value Date/Time   NA 139 12/28/2014 2105   K 3.7 12/28/2014 2105   CL 103 12/28/2014 2105   CO2 32  12/28/2014 2105   GLUCOSE 132* 12/28/2014 2105   BUN 13 12/28/2014 2105   CREATININE 0.89 12/28/2014 2105  CREATININE 0.75 12/25/2014 1504   CALCIUM 9.1 12/28/2014 2105   PROT 6.7 12/25/2014 1504   ALBUMIN 4.3 12/25/2014 1504   AST 21 12/25/2014 1504   ALT 18 12/25/2014 1504   ALKPHOS 81 12/25/2014 1504   BILITOT 0.5 12/25/2014 1504   GFRNONAA 72* 12/28/2014 2105   GFRAA 83* 12/28/2014 2105   Lab Results  Component Value Date   CHOL 196 12/25/2014   HDL 92 12/25/2014   LDLCALC 91 12/25/2014   TRIG 65 12/25/2014   CHOLHDL 2.1 12/25/2014   Lab Results  Component Value Date   HGBA1C  03/10/2010    5.5 (NOTE) The ADA recommends the following therapeutic goal for glycemic control related to Hgb A1c measurement: Goal of therapy: <6.5 Hgb A1c  Reference: American Diabetes Association: Clinical Practice Recommendations 2010, Diabetes Care, 2010, 33: (Suppl  1).      ASSESSMENT AND PLAN  Multiple sclerosis  Chronic fatigue  Urinary hesitancy  Abnormality of gait  Attention deficit disorder  Adjustment disorder with mixed anxiety and depressed mood   1.   Refill ritalin, hydrocodone and xanax.   Continue other med's 2.   She will f/u with urology and ortho about those issues 3.   Remain active, try to exercise 4.   Will need to re-check CBC with diff later in year rtc 3 months, sooner if problems   Richard A. Epimenio Foot, MD, PhD 03/24/2015, 3:36 PM Certified in Neurology, Clinical Neurophysiology, Sleep Medicine, Pain Medicine and Neuroimaging  [name] [address] [place], [place] [address] [phone]

## 2015-04-23 ENCOUNTER — Telehealth: Payer: Self-pay | Admitting: Neurology

## 2015-04-23 NOTE — Telephone Encounter (Signed)
I have spoken with [name].  She was seen in April--RAS increased Ritalin at that visit.  He intended to give postdated rx's for May and June, but instead gave rx's for June and July.  I have spoken with [name] at [facility] in [place] and given permission to fill July rx. now, then [facility] can have June rx. filled in July.  [name] is aware--will take rx. to [facility] tomorrow/fim

## 2015-04-23 NOTE — Telephone Encounter (Signed)
Pt called and stated that her medication Rx. methylphenidate (RITALIN) 20 MG tablet was written for "fill on or before 6/5". The patient was originally prescribed the medication in April, therefor she does not have any refills again until June and is out of medication. Please call and advise.

## 2015-05-07 ENCOUNTER — Telehealth: Payer: Self-pay | Admitting: Neurology

## 2015-05-07 MED ORDER — HYDROCODONE-ACETAMINOPHEN 7.5-325 MG PO TABS: 1.0000 | ORAL_TABLET | Freq: Three times a day (TID) | ORAL | Status: DC | PRN

## 2015-05-07 NOTE — Telephone Encounter (Signed)
Patient called requesting a refill for HYDROcodone-acetaminophen (NORCO) 7.5-325 MG per tablet.  Patient advised script will be ready within 24hours unless she hears otherwise from nurse. Patient can be reached at [phone].

## 2015-05-07 NOTE — Telephone Encounter (Signed)
Rx signed, up front [facility]/fim

## 2015-05-07 NOTE — Telephone Encounter (Signed)
Rx. printed, awaiting signature/fim 

## 2015-05-15 ENCOUNTER — Ambulatory Visit: Payer: 59 | Admitting: Internal Medicine

## 2015-05-18 ENCOUNTER — Encounter: Payer: Self-pay | Admitting: Internal Medicine

## 2015-05-18 ENCOUNTER — Ambulatory Visit (INDEPENDENT_AMBULATORY_CARE_PROVIDER_SITE_OTHER): Payer: 59 | Admitting: Internal Medicine

## 2015-05-18 ENCOUNTER — Ambulatory Visit
Admission: RE | Admit: 2015-05-18 | Discharge: 2015-05-18 | Disposition: A | Discharge status: Home / Self Care | Payer: 59 | Source: Ambulatory Visit | Attending: Internal Medicine | Admitting: Internal Medicine

## 2015-05-18 VITALS — BP 130/70 | HR 84 | Temp 97.7°F | Wt 163.0 lb

## 2015-05-18 DIAGNOSIS — J189 Pneumonia, unspecified organism: Secondary | ICD-10-CM

## 2015-05-18 DIAGNOSIS — J069 Acute upper respiratory infection, unspecified: Secondary | ICD-10-CM | POA: Diagnosis not present

## 2015-05-18 DIAGNOSIS — G35 Multiple sclerosis: Secondary | ICD-10-CM

## 2015-05-18 DIAGNOSIS — J181 Lobar pneumonia, unspecified organism: Principal | ICD-10-CM

## 2015-05-18 LAB — CBC WITH DIFFERENTIAL/PLATELET
Basophils Absolute: 0 K/uL (ref 0.0–0.1)
Basophils Relative: 0 % (ref 0–1)
Eosinophils Absolute: 0.1 K/uL (ref 0.0–0.7)
Eosinophils Relative: 1 % (ref 0–5)
HCT: 39.3 % (ref 36.0–46.0)
Hemoglobin: 13.5 g/dL (ref 12.0–15.0)
Lymphocytes Relative: 16 % (ref 12–46)
Lymphs Abs: 1.1 K/uL (ref 0.7–4.0)
MCH: 30 pg (ref 26.0–34.0)
MCHC: 34.4 g/dL (ref 30.0–36.0)
MCV: 87.3 fL (ref 78.0–100.0)
MPV: 9.8 fL (ref 8.6–12.4)
Monocytes Absolute: 0.7 K/uL (ref 0.1–1.0)
Monocytes Relative: 10 % (ref 3–12)
Neutro Abs: 4.9 K/uL (ref 1.7–7.7)
Neutrophils Relative %: 73 % (ref 43–77)
Platelets: 264 K/uL (ref 150–400)
RBC: 4.5 MIL/uL (ref 3.87–5.11)
RDW: 13.4 % (ref 11.5–15.5)
WBC: 6.7 K/uL (ref 4.0–10.5)

## 2015-05-18 MED ORDER — CEFTRIAXONE SODIUM 1 G IJ SOLR
1.0000 g | Freq: Once | INTRAMUSCULAR | Status: AC
  Administered 2015-05-18: 1 g via INTRAMUSCULAR

## 2015-05-18 MED ORDER — ALBUTEROL SULFATE HFA 108 (90 BASE) MCG/ACT IN AERS: 2.0000 | INHALATION_SPRAY | Freq: Four times a day (QID) | RESPIRATORY_TRACT | Status: DC | PRN

## 2015-05-18 MED ORDER — AZITHROMYCIN 250 MG PO TABS: ORAL_TABLET | ORAL | Status: DC

## 2015-05-18 MED ORDER — HYDROCOD POLST-CPM POLST ER 10-8 MG/5ML PO SUER: 5.0000 mL | Freq: Two times a day (BID) | ORAL | Status: DC | PRN

## 2015-05-19 ENCOUNTER — Telehealth: Payer: Self-pay | Admitting: *Deleted

## 2015-05-19 NOTE — Telephone Encounter (Signed)
Reviewed lab work and chest xray results

## 2015-05-21 ENCOUNTER — Other Ambulatory Visit: Payer: Self-pay | Admitting: *Deleted

## 2015-05-21 ENCOUNTER — Other Ambulatory Visit: Payer: Self-pay | Admitting: Neurology

## 2015-05-21 ENCOUNTER — Telehealth: Payer: Self-pay | Admitting: Internal Medicine

## 2015-05-21 MED ORDER — AZITHROMYCIN 250 MG PO TABS: ORAL_TABLET | ORAL | Status: DC

## 2015-05-21 NOTE — Telephone Encounter (Signed)
Refilled zithromax per Dr Lenord Fellers

## 2015-05-21 NOTE — Telephone Encounter (Signed)
Spoke with Dr. Lenord Fellers; she advised to order refill on z-pac.  [name] will e-scribe z-pac to pharmacy.  I spoke with patient to advise that Rx will be sent.  Advised patient to drink a lot of fluids and get plenty of rest.  She will feel lousy for probably 2-3 weeks due to the pneumonia.  She is instructed to keep her appointment on Tuesday, 6/21 at 4pm in our office.  Patient verbalized understanding of these instructions.

## 2015-05-21 NOTE — Telephone Encounter (Signed)
Patient called stating that she will finish her z-pac tomorrow and still feels "awful".  Wanted to know if she should have another round of z-pac.  Has return appointment on Tuesday, 6/21.

## 2015-05-26 ENCOUNTER — Ambulatory Visit (INDEPENDENT_AMBULATORY_CARE_PROVIDER_SITE_OTHER): Payer: 59 | Admitting: Internal Medicine

## 2015-05-26 ENCOUNTER — Encounter: Payer: Self-pay | Admitting: Internal Medicine

## 2015-05-26 ENCOUNTER — Ambulatory Visit
Admission: RE | Admit: 2015-05-26 | Discharge: 2015-05-26 | Disposition: A | Discharge status: Home / Self Care | Payer: 59 | Source: Ambulatory Visit | Attending: Internal Medicine | Admitting: Internal Medicine

## 2015-05-26 VITALS — BP 108/68 | HR 98 | Temp 97.7°F | Wt 163.0 lb

## 2015-05-26 DIAGNOSIS — J189 Pneumonia, unspecified organism: Secondary | ICD-10-CM

## 2015-05-26 DIAGNOSIS — R5382 Chronic fatigue, unspecified: Secondary | ICD-10-CM | POA: Diagnosis not present

## 2015-05-26 DIAGNOSIS — G35 Multiple sclerosis: Secondary | ICD-10-CM | POA: Diagnosis not present

## 2015-05-26 DIAGNOSIS — J181 Lobar pneumonia, unspecified organism: Principal | ICD-10-CM

## 2015-05-26 MED ORDER — METHYLPREDNISOLONE ACETATE 80 MG/ML IJ SUSP
80.0000 mg | Freq: Once | INTRAMUSCULAR | Status: AC
  Administered 2015-05-26: 80 mg via INTRAMUSCULAR

## 2015-05-26 MED ORDER — CEFTRIAXONE SODIUM 1 G IJ SOLR
1.0000 g | Freq: Once | INTRAMUSCULAR | Status: AC
  Administered 2015-05-26: 1 g via INTRAMUSCULAR

## 2015-05-31 NOTE — Patient Instructions (Addendum)
Depo-Medrol given IM. 1 g IM Rocephin given. Chest x-ray has cleared. Rest and drink plenty of fluids.

## 2015-05-31 NOTE — Progress Notes (Signed)
   Subjective:    Patient ID: [name], female    DOB: 1961-06-29, 54 y.o.   MRN: [id number]  HPI She was here June 13 and diagnosed with right middle lobe pneumonia. Was treated with IM Rocephin here in the office and started on a Zithromax Z-Pak. Has several drug intolerances. She also has multiple sclerosis. Has felt very fatigued. Has been slow to recover from right middle lobe pneumonia. Feels she's not doing well. CBC June 13 was normal. Still has a lot of coughing but mostly fatigue.    Review of Systems     Objective:   Physical Exam  Skin pale warm and dry. Nodes none. TMs and pharynx are clear. Neck is supple without adenopathy. Chest clear. Repeated chest x-ray which showed no evidence of right middle lobe pneumonia      Assessment & Plan:  Resolving right middle lobe pneumonia  Fatigue  Multiple sclerosis  Plan: Offered her a course of oral steroids to help with fatigue but she does not want to take these sighting adverse side effects. She will take an injection of Depo-Medrol IM. She needs to rest and drink plenty of fluids.

## 2015-06-02 NOTE — Patient Instructions (Addendum)
Takes Zithromax Z-PAK as directed. Rocephin given today. Return in one week.

## 2015-06-02 NOTE — Progress Notes (Signed)
   Subjective:    Patient ID: [name], female    DOB: 1961-03-22, 54 y.o.   MRN: [id number]  HPI In today with significant URI symptoms for couple of weeks. Has had cough and congestion. Has malaise and fatigue. Also has multiple sclerosis. Has had fever and shaking chills. Has no energy.    Review of Systems     Objective:   Physical Exam  Neck is supple without JVD thyromegaly or carotid bruits. Skin is pale warm and dry. Chest rhonchi and wheezing in right chest. Cardiac exam regular rate and rhythm. Extremities without edema. TMs and pharynx are clear.  Chest x-ray shows right middle lobe airspace disease      Assessment & Plan:  Right middle lobe pneumonia  Multiple sclerosis  Plan: Rocephin 1 g IM. Zithromax Z-Pak take 2 tablets by mouth day one followed by 1 tablet by mouth days 2 through 5. Albuterol inhaler for cough and wheezing. Return for follow-up June 21.

## 2015-06-03 ENCOUNTER — Other Ambulatory Visit: Payer: Self-pay | Admitting: *Deleted

## 2015-06-03 MED ORDER — HYDROCODONE-ACETAMINOPHEN 7.5-325 MG PO TABS: 1.0000 | ORAL_TABLET | Freq: Three times a day (TID) | ORAL | Status: DC | PRN

## 2015-06-03 NOTE — Telephone Encounter (Signed)
Pt is calling wanted a refill of her hydrocodone. She will be going out of town Friday and will not be back until next weekend and needs a refill for going out of town. Please call

## 2015-06-04 ENCOUNTER — Encounter: Payer: Self-pay | Admitting: *Deleted

## 2015-06-04 NOTE — Progress Notes (Signed)
Hydrocodone rx. up front [facility]/fim 

## 2015-06-23 ENCOUNTER — Ambulatory Visit (INDEPENDENT_AMBULATORY_CARE_PROVIDER_SITE_OTHER): Payer: Commercial Managed Care - HMO | Admitting: Neurology

## 2015-06-23 ENCOUNTER — Encounter: Payer: Self-pay | Admitting: Neurology

## 2015-06-23 VITALS — BP 130/86 | HR 72 | Resp 16 | Ht 65.0 in | Wt 165.8 lb

## 2015-06-23 DIAGNOSIS — M159 Polyosteoarthritis, unspecified: Secondary | ICD-10-CM

## 2015-06-23 DIAGNOSIS — F4323 Adjustment disorder with mixed anxiety and depressed mood: Secondary | ICD-10-CM

## 2015-06-23 DIAGNOSIS — R3911 Hesitancy of micturition: Secondary | ICD-10-CM

## 2015-06-23 DIAGNOSIS — R5382 Chronic fatigue, unspecified: Secondary | ICD-10-CM | POA: Diagnosis not present

## 2015-06-23 DIAGNOSIS — R269 Unspecified abnormalities of gait and mobility: Secondary | ICD-10-CM | POA: Diagnosis not present

## 2015-06-23 DIAGNOSIS — F909 Attention-deficit hyperactivity disorder, unspecified type: Secondary | ICD-10-CM | POA: Diagnosis not present

## 2015-06-23 DIAGNOSIS — G35 Multiple sclerosis: Secondary | ICD-10-CM

## 2015-06-23 DIAGNOSIS — M8949 Other hypertrophic osteoarthropathy, multiple sites: Secondary | ICD-10-CM

## 2015-06-23 DIAGNOSIS — M150 Primary generalized (osteo)arthritis: Secondary | ICD-10-CM

## 2015-06-23 DIAGNOSIS — F988 Other specified behavioral and emotional disorders with onset usually occurring in childhood and adolescence: Secondary | ICD-10-CM

## 2015-06-23 MED ORDER — METHYLPHENIDATE HCL 20 MG PO TABS: ORAL_TABLET | ORAL | Status: DC

## 2015-06-23 MED ORDER — HYDROCODONE-ACETAMINOPHEN 7.5-325 MG PO TABS: 1.0000 | ORAL_TABLET | Freq: Three times a day (TID) | ORAL | Status: DC | PRN

## 2015-06-23 MED ORDER — HYOSCYAMINE SULFATE 0.125 MG PO TABS: 0.1250 mg | ORAL_TABLET | ORAL | Status: DC | PRN

## 2015-06-23 NOTE — Progress Notes (Signed)
[facility]  PATIENT: [name] DOB: January 18, 1961 REASON FOR VISIT: Multiple sclerosis   HISTORICAL  CHIEF COMPLAINT:  Chief Complaint  Patient presents with  . Multiple Sclerosis    Sts. she continues to tolerate Tecfidera well.  Sts. she was sick with pneumonia most of June.  Sts. fatigue and concentration have been worse since the weather has been hotter.  Sts. lbp has been worse--radiating down the sides of both legs to feet.  Sts. left leg is weaker, but left knee is bad and she isn't sure if this is the cause of the weakness. Sts. pain is also worse in toracic spine, just above bra line.  She is seeing Dr. Ethelene Hal at [facility] for pain mx. for back pain/fim    HISTORY OF PRESENT ILLNESS:  [name] is a 54 year old woman with multiple sclerosis since 2008 who is reporting more generalized weakness and fatigue.   She also reports having pneumonia in June, treated with Abx shots   She tolerates Tecfidera well and she has not had any major exacerbation while she has been on it.   She was diagnosed with AFib in the past.     Gait/Strength/sensation:   She has gait ataxia and feels it is slightly worse than last year.   She reports right greater than left leg clumsiness. She has fallen a few times in the past but none recently.   She occasionally uses a cane, mostly when arthritic issues act up.   She also has knee pain and has been told she should have a knee replacement.   .      Bladder:   She has bladder dysfunction that is likely related to the MS.  Tamsulosin was poorly tolerated.  She has seen urology.  Vision:    She has occasional diplopia, especially when she is tired or with gabapentin.   An early exacerbation was diplopia.     Fatigue/sleep:   She reports difficulties with fatigue that is both physical and cognitive. She also has difficulties with attention. She feels both physical and cognitive fatigue and her attentional problems improved when  she takes Ritalin. She is currently on the Ritalin 20 mg by mouth 3 times a day.   Mood/cognition:  She has had depression in the past but is doing much better this year and was able to stop Pristiq. She feels better off of it.  She has mild anxiety. She takes Xanax more for pain and spasticity than for her anxiety.   Cognition is mildly reduced.   She has some word finding problems and decreased attention.   Methylphenidate has helped a lot and she tolerates it well.      Pain:   She has pain that is multifactorial. In the legs, she has dysesthetia. However, she also has degenerative changes in her neck and her lumbar spine causing pain with spinal stenosis. She also has bad knees and may need TKR soon.    Flexeril was poorly tolerated.    MS History:  In 2008, she had an MRI of the brain performed which was consistent with multiple sclerosis and she was diagnosed with relapsing remitting MS. He was initially placed on Avonex for therapy. She had some exacerbations with weakness and clumsiness with her gait and she started to see Dr. Leotis Shames. He placed her on Tysabri she is on Tysabri between 2010 and 2013. 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      REVIEW OF SYSTEMS:  Constitutional: No fevers, chills, sweats, or change in appetite.  She notes fatigue Eyes: No visual changes,eye pain.   She has intermittent diplopia Ear, nose and throat: No hearing loss, ear pain, nasal congestion, sore throat Cardiovascular: No chest pain, palpitations Respiratory:  No shortness of breath at rest or with exertion.   No wheezes GastrointestinaI: No nausea, vomiting, diarrhea, abdominal pain, fecal incontinence Genitourinary:  a sabove Musculoskeletal:  she has both neck pain and back pain. Also bilat knee pain Integumentary: No rash, pruritus, skin lesions.   She has Raynauds in her hands  Neurological: as  above Psychiatric: No depression at this time.  Anxiety doing ok Endocrine: No palpitations, diaphoresis, change in appetite, change in weigh or increased thirst Hematologic/Lymphatic:  No anemia, purpura, petechiae. Allergic/Immunologic: No itchy/runny eyes, nasal congestion, recent allergic reactions, rashes  ALLERGIES: Allergies  Allergen Reactions  . Ibuprofen Other (See Comments)    CAUSES LYMPHEDEMA  . Nsaids Other (See Comments)    AVOIDS ANY SODIUM CONTAINING MED. -- CAUSES LYMPHEDEMA    HOME MEDICATIONS: Outpatient Prescriptions Prior to Visit  Medication Sig Dispense Refill  . albuterol (PROVENTIL HFA;VENTOLIN HFA) 108 (90 BASE) MCG/ACT inhaler Inhale 2 puffs into the lungs every 6 (six) hours as needed for wheezing or shortness of breath. 1 Inhaler 0  . ALPRAZolam (XANAX) 0.5 MG tablet Take 1 tablet (0.5 mg total) by mouth 3 (three) times daily as needed for anxiety or sleep. 90 tablet 5  . chlorpheniramine-HYDROcodone (TUSSIONEX PENNKINETIC ER) 10-8 MG/5ML SUER Take 5 mLs by mouth every 12 (twelve) hours as needed for cough. 115 mL 0  . Dimethyl Fumarate (TECFIDERA) 240 MG CPDR Take 1 tablet by mouth every 12 (twelve) hours.     . gabapentin (NEURONTIN) 600 MG tablet TAKE 1 TABLET BY MOUTH 4 TIMES A DAY 120 tablet 6  . HYDROcodone-acetaminophen (NORCO) 7.5-325 MG per tablet Take 1-2 tablets by mouth every 8 (eight) hours as needed. 180 tablet 0  . methylphenidate (RITALIN) 20 MG tablet 40 mg in the morning and 20 mg with lunch. 90 tablet 0  . metoprolol (LOPRESSOR) 50 MG tablet Take 0.5 tablets (25 mg total) by mouth 2 (two) times daily as needed (racing heartbeat). 30 tablet 0  . azithromycin (ZITHROMAX) 250 MG tablet 2 po day 1 followed by one po days 2-5 (Patient not taking: Reported on 06/23/2015) 6 tablet 0   No facility-administered medications prior to visit.    PAST MEDICAL HISTORY: Past Medical History  Diagnosis Date  . MVP (mitral valve prolapse) HX HEART  RACING  YRS AGO    ASYMPTOMATIC SINCE THEN  . Weakness of right leg SECONDARY TO MS  . History of concussion YOUNG ADULT--  NO RESIDUAL  . Headache(784.0)   . History of edema PERIPHERAL LYMPHEDEMA  . Arthritis   . Bone spur   . MS (multiple sclerosis)   . 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 Movement disorder   . Vision abnormalities   . Atrial fibrillation     PAST SURGICAL HISTORY: Past Surgical History  Procedure Laterality Date  . Destruction of anal condyloma  07-07-2005  DR TOTH  . Laparoscopic assisted vaginal hysterectomy  01-15-2002  DR Gerlene Burdock [facility]/ DR TIM DAVIS    AND REPAIR VENTRAL HERNIA  . Cesarean section  X3    BILATERAL TUBAL LIGATION W/ LAST ONE  .  Knee arthroscopy with lateral menisectomy  10/26/2012    Procedure: KNEE ARTHROSCOPY WITH LATERAL MENISECTOMY;  Surgeon: Javier Docker, MD;  Location: [facility];  Service: Orthopedics;  Laterality: Left;  Left Knee Arthrsocopy with Partial Lateral Menisectomy  . Laser ablation condolamata N/A 04/26/2013    Procedure: LASER ABLATION CONDOLAMATA;  Surgeon: Romie Levee, MD;  Location: [facility];  Service: General;  Laterality: N/A;  . Tonsillectomy      FAMILY HISTORY: Family History  Problem Relation Age of Onset  . Diabetes Mother   . Hypertension Mother   . Stroke Mother   . Bowel Disease Brother   . Diabetes Brother   . Healthy Brother   . Emphysema Father   . Heart attack Father     SOCIAL HISTORY:  History   Social History  . Marital Status: Married    Spouse Name: N/A  . Number of Children: 3  . Years of Education: N/A   Occupational History  . Not on file.   Social History Main Topics  . Smoking status: Never Smoker   . Smokeless tobacco: Never Used  . Alcohol Use: No  . Drug Use: No  . Sexual Activity: Not on file   Other Topics Concern  . Not on file   Social History Narrative     PHYSICAL EXAM  Filed Vitals:   06/23/15 1439   BP: 130/86  Pulse: 72  Resp: 16  Height:  (1.651 m)  Weight: 165 lb 12.8 oz (75.206 kg)    Body mass index is 27.59 kg/(m^2).   General: The patient is well-developed and well-nourished and in no acute distress  Neck: The neck is supple, no carotid bruits are noted.  The neck is nontender.  Cardiovascular: The cardiovascular examination reveals a regular rate and rhythm, no murmurs, gallops or rubs are noted.  Skin: Extremities are without significant edema.  Neurologic Exam  Mental status: The patient is alert and oriented x 3 at the time of the examination. The patient has apparent normal recent and remote memory, with an apparently normal attention span and concentration ability.   Speech is normal.  Cranial nerves: Extraocular movements are full.   Facial symmetry is present. There is good facial sensation to soft touch bilaterally.Facial strength is normal.  Trapezius and sternocleidomastoid strength is normal. No dysarthria is noted.  The tongue is midline, and the patient has symmetric elevation of the soft palate.   Motor:  Muscle bulk is normal and tone is minimally increased in both legs. Strength is  5 / 5 in all 4 extremities.   Sensory: Sensory testing is intact to pinprick, soft touch, vibration sensation, and position sense on all 4 extremities.  Coordination: Cerebellar testing reveals good finger-nose-finger bilaterally.  Gait and station: Station is normal and gait is arthritic. Tandem gait is wide. Romberg is negative.   Reflexes: Deep tendon reflexes are symmetric and brisk bilaterally.      DIAGNOSTIC DATA (LABS, IMAGING, TESTING) - I reviewed patient records, labs, notes, testing and imaging myself where available.  Lab Results  Component Value Date   WBC 6.7 05/18/2015   HGB 13.5 05/18/2015   HCT 39.3 05/18/2015   MCV 87.3 05/18/2015   PLT 264 05/18/2015      Component Value Date/Time   NA 139 12/28/2014 2105   K 3.7 12/28/2014 2105    CL 103 12/28/2014 2105   CO2 32 12/28/2014 2105   GLUCOSE 132* 12/28/2014 2105  BUN 13 12/28/2014 2105   CREATININE 0.89 12/28/2014 2105   CREATININE 0.75 12/25/2014 1504   CALCIUM 9.1 12/28/2014 2105   PROT 6.7 12/25/2014 1504   ALBUMIN 4.3 12/25/2014 1504   AST 21 12/25/2014 1504   ALT 18 12/25/2014 1504   ALKPHOS 81 12/25/2014 1504   BILITOT 0.5 12/25/2014 1504   GFRNONAA 72* 12/28/2014 2105   GFRAA 83* 12/28/2014 2105   Lab Results  Component Value Date   CHOL 196 12/25/2014   HDL 92 12/25/2014   LDLCALC 91 12/25/2014   TRIG 65 12/25/2014   CHOLHDL 2.1 12/25/2014   Lab Results  Component Value Date   HGBA1C  03/10/2010    5.5 (NOTE) The ADA recommends the following therapeutic goal for glycemic control related to Hgb A1c measurement: Goal of therapy: <6.5 Hgb A1c  Reference: American Diabetes Association: Clinical Practice Recommendations 2010, Diabetes Care, 2010, 33: (Suppl  1).      ASSESSMENT AND PLAN  Multiple sclerosis - Plan: CBC with Differential/Platelet  Chronic fatigue  Urinary hesitancy  Attention deficit disorder  Abnormality of gait  Adjustment disorder with mixed anxiety and depressed mood  Primary osteoarthritis involving multiple joints    1.   Refill ritalin, hydrocodone.   Continue other med's 2.   Remain active, try to exercise 4.   Continue Tecfidera    check CBC with diff to rule out lymphopenia.  rtc 4-6 months, sooner if problems   Vela Render A. Epimenio Foot, MD, PhD 06/23/2015, 2:58 PM Certified in Neurology, Clinical Neurophysiology, Sleep Medicine, Pain Medicine and Neuroimaging  [facility] [address], [address] [place], [place] [address] [phone]

## 2015-06-24 ENCOUNTER — Telehealth: Payer: Self-pay | Admitting: *Deleted

## 2015-06-24 LAB — CBC WITH DIFFERENTIAL/PLATELET
Basophils Absolute: 0 10*3/uL (ref 0.0–0.2)
Basos: 1 %
EOS (ABSOLUTE): 0 10*3/uL (ref 0.0–0.4)
Eos: 1 %
Hematocrit: 41.3 % (ref 34.0–46.6)
Hemoglobin: 13.8 g/dL (ref 11.1–15.9)
Immature Grans (Abs): 0 10*3/uL (ref 0.0–0.1)
Immature Granulocytes: 0 %
Lymphocytes Absolute: 1.1 10*3/uL (ref 0.7–3.1)
Lymphs: 30 %
MCH: 30.2 pg (ref 26.6–33.0)
MCHC: 33.4 g/dL (ref 31.5–35.7)
MCV: 90 fL (ref 79–97)
Monocytes Absolute: 0.3 10*3/uL (ref 0.1–0.9)
Monocytes: 9 %
Neutrophils Absolute: 2.2 10*3/uL (ref 1.4–7.0)
Neutrophils: 59 %
Platelets: 232 10*3/uL (ref 150–379)
RBC: 4.57 x10E6/uL (ref 3.77–5.28)
RDW: 13.8 % (ref 12.3–15.4)
WBC: 3.7 10*3/uL (ref 3.4–10.8)

## 2015-06-24 NOTE — Telephone Encounter (Signed)
I have spoken with [name] this morning and per RAS, advised that lymphocytes were normal--she should continue Tecfidera as rx'd.  She verbalized understanding of same/fim

## 2015-06-24 NOTE — Telephone Encounter (Signed)
-----   Message from Asa Lente, MD sent at 06/24/2015  8:33 AM EDT ----- Please let her know lymphocyte count is fine

## 2015-07-07 ENCOUNTER — Ambulatory Visit: Payer: Commercial Managed Care - HMO | Admitting: Internal Medicine

## 2015-07-07 ENCOUNTER — Telehealth: Payer: Self-pay | Admitting: Internal Medicine

## 2015-07-07 ENCOUNTER — Encounter (HOSPITAL_COMMUNITY): Payer: Self-pay | Admitting: Emergency Medicine

## 2015-07-07 ENCOUNTER — Emergency Department (HOSPITAL_COMMUNITY)
Admission: EM | Admit: 2015-07-07 | Discharge: 2015-07-07 | Disposition: A | Discharge status: Home / Self Care | Payer: 59 | Attending: Emergency Medicine | Admitting: Emergency Medicine

## 2015-07-07 DIAGNOSIS — R197 Diarrhea, unspecified: Secondary | ICD-10-CM | POA: Diagnosis not present

## 2015-07-07 DIAGNOSIS — R300 Dysuria: Secondary | ICD-10-CM | POA: Insufficient documentation

## 2015-07-07 DIAGNOSIS — Z87828 Personal history of other (healed) physical injury and trauma: Secondary | ICD-10-CM | POA: Diagnosis not present

## 2015-07-07 DIAGNOSIS — G629 Polyneuropathy, unspecified: Secondary | ICD-10-CM | POA: Insufficient documentation

## 2015-07-07 DIAGNOSIS — Z79899 Other long term (current) drug therapy: Secondary | ICD-10-CM | POA: Insufficient documentation

## 2015-07-07 DIAGNOSIS — I4891 Unspecified atrial fibrillation: Secondary | ICD-10-CM | POA: Insufficient documentation

## 2015-07-07 DIAGNOSIS — M1990 Unspecified osteoarthritis, unspecified site: Secondary | ICD-10-CM | POA: Diagnosis not present

## 2015-07-07 DIAGNOSIS — R112 Nausea with vomiting, unspecified: Secondary | ICD-10-CM | POA: Diagnosis not present

## 2015-07-07 DIAGNOSIS — I341 Nonrheumatic mitral (valve) prolapse: Secondary | ICD-10-CM | POA: Diagnosis not present

## 2015-07-07 DIAGNOSIS — G35 Multiple sclerosis: Secondary | ICD-10-CM | POA: Diagnosis not present

## 2015-07-07 DIAGNOSIS — R109 Unspecified abdominal pain: Secondary | ICD-10-CM | POA: Diagnosis present

## 2015-07-07 DIAGNOSIS — R5383 Other fatigue: Secondary | ICD-10-CM | POA: Diagnosis not present

## 2015-07-07 LAB — URINALYSIS, ROUTINE W REFLEX MICROSCOPIC
Bilirubin Urine: NEGATIVE
Glucose, UA: NEGATIVE mg/dL
Hgb urine dipstick: NEGATIVE
Ketones, ur: NEGATIVE mg/dL
Leukocytes, UA: NEGATIVE
Nitrite: NEGATIVE
Protein, ur: NEGATIVE mg/dL
Specific Gravity, Urine: 1.025 (ref 1.005–1.030)
Urobilinogen, UA: 0.2 mg/dL (ref 0.0–1.0)
pH: 6 (ref 5.0–8.0)

## 2015-07-07 LAB — COMPREHENSIVE METABOLIC PANEL
ALT: 18 U/L (ref 14–54)
AST: 20 U/L (ref 15–41)
Albumin: 3.5 g/dL (ref 3.5–5.0)
Alkaline Phosphatase: 63 U/L (ref 38–126)
Anion gap: 9 (ref 5–15)
BUN: 14 mg/dL (ref 6–20)
CO2: 28 mmol/L (ref 22–32)
Calcium: 8.5 mg/dL — ABNORMAL LOW (ref 8.9–10.3)
Chloride: 102 mmol/L (ref 101–111)
Creatinine, Ser: 0.8 mg/dL (ref 0.44–1.00)
GFR calc Af Amer: >60 mL/min (ref >60)
GFR calc non Af Amer: >60 mL/min (ref >60)
Glucose, Bld: 91 mg/dL (ref 65–99)
Potassium: 3.9 mmol/L (ref 3.5–5.1)
Sodium: 139 mmol/L (ref 135–145)
Total Bilirubin: 0.4 mg/dL (ref 0.3–1.2)
Total Protein: 6.2 g/dL — ABNORMAL LOW (ref 6.5–8.1)

## 2015-07-07 LAB — CBC WITH DIFFERENTIAL/PLATELET
Basophils Absolute: 0 10*3/uL (ref 0.0–0.1)
Basophils Relative: 0 % (ref 0–1)
Eosinophils Absolute: 0 10*3/uL (ref 0.0–0.7)
Eosinophils Relative: 1 % (ref 0–5)
HCT: 39.9 % (ref 36.0–46.0)
Hemoglobin: 13.5 g/dL (ref 12.0–15.0)
Lymphocytes Relative: 15 % (ref 12–46)
Lymphs Abs: 0.6 10*3/uL — ABNORMAL LOW (ref 0.7–4.0)
MCH: 30.5 pg (ref 26.0–34.0)
MCHC: 33.8 g/dL (ref 30.0–36.0)
MCV: 90.3 fL (ref 78.0–100.0)
Monocytes Absolute: 0.4 10*3/uL (ref 0.1–1.0)
Monocytes Relative: 11 % (ref 3–12)
Neutro Abs: 2.7 10*3/uL (ref 1.7–7.7)
Neutrophils Relative %: 73 % (ref 43–77)
Platelets: 206 10*3/uL (ref 150–400)
RBC: 4.42 MIL/uL (ref 3.87–5.11)
RDW: 13.1 % (ref 11.5–15.5)
WBC: 3.7 10*3/uL — ABNORMAL LOW (ref 4.0–10.5)

## 2015-07-07 LAB — LIPASE, BLOOD: Lipase: 16 U/L — ABNORMAL LOW (ref 22–51)

## 2015-07-07 MED ORDER — HYDROMORPHONE HCL 1 MG/ML IJ SOLN
1.0000 mg | Freq: Once | INTRAMUSCULAR | Status: AC
  Administered 2015-07-07: 1 mg via INTRAVENOUS
  Filled 2015-07-07: qty 1

## 2015-07-07 MED ORDER — ONDANSETRON 4 MG PO TBDP: 4.0000 mg | ORAL_TABLET | Freq: Three times a day (TID) | ORAL | Status: DC | PRN

## 2015-07-07 MED ORDER — ONDANSETRON HCL 4 MG/2ML IJ SOLN
4.0000 mg | Freq: Once | INTRAMUSCULAR | Status: AC
  Administered 2015-07-07: 4 mg via INTRAVENOUS
  Filled 2015-07-07: qty 2

## 2015-07-07 MED ORDER — HYOSCYAMINE SULFATE 0.125 MG SL SUBL: 0.1250 mg | SUBLINGUAL_TABLET | SUBLINGUAL | Status: DC | PRN

## 2015-07-07 MED ORDER — DICYCLOMINE HCL 20 MG PO TABS: 20.0000 mg | ORAL_TABLET | Freq: Two times a day (BID) | ORAL | Status: DC

## 2015-07-07 MED ORDER — SODIUM CHLORIDE 0.9 % IV BOLUS (SEPSIS)
1000.0000 mL | Freq: Once | INTRAVENOUS | Status: AC
Start: 2015-07-07 — End: 2015-07-07
  Administered 2015-07-07: 1000 mL via INTRAVENOUS

## 2015-07-07 MED ORDER — METOCLOPRAMIDE HCL 5 MG/ML IJ SOLN
10.0000 mg | INTRAMUSCULAR | Status: AC
  Administered 2015-07-07: 10 mg via INTRAVENOUS
  Filled 2015-07-07: qty 2

## 2015-07-07 MED ORDER — MORPHINE SULFATE 4 MG/ML IJ SOLN
4.0000 mg | Freq: Once | INTRAMUSCULAR | Status: AC
  Administered 2015-07-07: 4 mg via INTRAVENOUS
  Filled 2015-07-07: qty 1

## 2015-07-07 NOTE — ED Notes (Signed)
Unable to collect labs, IV will not draw back.

## 2015-07-07 NOTE — ED Provider Notes (Signed)
CSN: [id number]     Arrival date & time 07/07/15  1100 History   First MD Initiated Contact with Patient 07/07/15 1118     Chief Complaint  Patient presents with  . Abdominal Pain     (Consider location/radiation/quality/duration/timing/severity/associated sxs/prior Treatment) Patient is a 54 y.o. female presenting with abdominal pain.  Abdominal Pain Associated symptoms: diarrhea, dysuria, nausea and vomiting    This is a 54 y.o. F with hx of MS, AFIB, MVP, presenting to the ED for abdominal pain.  Patient states approx 2 months ago she felt that she got a UTI as she was having dysuria and frequent urinations.  States she used home remedies but did not seek evaluation for this.  States initially she got better, however her symptoms have returned. She reports a "pressure" sensation in her suprapubic region. She denies any vaginal complaints. Patient also has had nausea, vomiting, and diarrhea for the past week. Her daughter and grandchild who she was babysitting was recently sick with similar symptoms. States she has cramping in her epigastric region currently.  States she was running a fever last week, however none in the past several days.  States she has been tolerating very little PO due to her nausea.  States she feels generally weak with low energy.  No melena or hematochezia.  Prior abdominal surgeries include c-section and hysterectomy.  No hx of diverticulosis/diverticulitits.  VSS.  Past Medical History  Diagnosis Date  . MVP (mitral valve prolapse) HX HEART RACING  YRS AGO    ASYMPTOMATIC SINCE THEN  . Weakness of right leg SECONDARY TO MS  . History of concussion YOUNG ADULT--  NO RESIDUAL  . Headache(784.0)   . History of edema PERIPHERAL LYMPHEDEMA  . Arthritis   . Bone spur   . MS (multiple sclerosis)   . 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 Movement disorder   . Vision abnormalities   . Atrial fibrillation    Past Surgical History  Procedure Laterality Date   . Destruction of anal condyloma  07-07-2005  DR TOTH  . Laparoscopic assisted vaginal hysterectomy  01-15-2002  DR Gerlene Burdock [facility]/ DR TIM DAVIS    AND REPAIR VENTRAL HERNIA  . Cesarean section  X3    BILATERAL TUBAL LIGATION W/ LAST ONE  . Knee arthroscopy with lateral menisectomy  10/26/2012    Procedure: KNEE ARTHROSCOPY WITH LATERAL MENISECTOMY;  Surgeon: Javier Docker, MD;  Location: [facility];  Service: Orthopedics;  Laterality: Left;  Left Knee Arthrsocopy with Partial Lateral Menisectomy  . Laser ablation condolamata N/A 04/26/2013    Procedure: LASER ABLATION CONDOLAMATA;  Surgeon: Romie Levee, MD;  Location: [facility];  Service: General;  Laterality: N/A;  . Tonsillectomy     Family History  Problem Relation Age of Onset  . Diabetes Mother   . Hypertension Mother   . Stroke Mother   . Bowel Disease Brother   . Diabetes Brother   . Healthy Brother   . Emphysema Father   . Heart attack Father    History  Substance Use Topics  . Smoking status: Never Smoker   . Smokeless tobacco: Never Used  . Alcohol Use: No   OB History    No data available     Review of Systems  Gastrointestinal: Positive for nausea, vomiting, abdominal pain and diarrhea.  Genitourinary: Positive for dysuria.  All other systems reviewed and are negative.     Allergies  Ibuprofen and  Nsaids  Home Medications   Prior to Admission medications   Medication Sig Start Date End Date Taking? Authorizing Provider  ALPRAZolam Prudy Feeler) 0.5 MG tablet Take 1 tablet (0.5 mg total) by mouth 3 (three) times daily as needed for anxiety or sleep. 03/24/15  Yes Asa Lente, MD  Dimethyl Fumarate (TECFIDERA) 240 MG CPDR Take 1 tablet by mouth every 12 (twelve) hours.    Yes Historical Provider, MD  gabapentin (NEURONTIN) 600 MG tablet TAKE 1 TABLET BY MOUTH 4 TIMES A DAY 05/21/15  Yes Asa Lente, MD  HYDROcodone-acetaminophen (NORCO) 7.5-325 MG per tablet Take 1-2  tablets by mouth every 8 (eight) hours as needed. 06/23/15  Yes Asa Lente, MD  hyoscyamine (LEVSIN, ANASPAZ) 0.125 MG tablet Take 1 tablet (0.125 mg total) by mouth every 4 (four) hours as needed. 06/23/15  Yes Asa Lente, MD  methylphenidate (RITALIN) 20 MG tablet 40 mg in the morning and 20 mg with lunch. 06/23/15  Yes Asa Lente, MD  metoprolol (LOPRESSOR) 50 MG tablet Take 0.5 tablets (25 mg total) by mouth 2 (two) times daily as needed (racing heartbeat). 12/30/14  Yes Kathlen Mody, MD  albuterol (PROVENTIL HFA;VENTOLIN HFA) 108 (90 BASE) MCG/ACT inhaler Inhale 2 puffs into the lungs every 6 (six) hours as needed for wheezing or shortness of breath. 05/18/15   [name], MD  chlorpheniramine-HYDROcodone (TUSSIONEX PENNKINETIC ER) 10-8 MG/5ML SUER Take 5 mLs by mouth every 12 (twelve) hours as needed for cough. Patient not taking: Reported on 07/07/2015 05/18/15   [name], MD   BP 131/78 mmHg  Pulse 68  Temp(Src) 97.6 F (36.4 C) (Oral)  Resp 18  SpO2 98%   Physical Exam  Constitutional: She is oriented to person, place, and time. She appears well-developed and well-nourished. No distress.  Appears fatigued  HENT:  Head: Normocephalic and atraumatic.  Mouth/Throat: Oropharynx is clear and moist.  Mildly dry mucous membranes  Eyes: Conjunctivae and EOM are normal. Pupils are equal, round, and reactive to light.  Neck: Normal range of motion. Neck supple.  Cardiovascular: Normal rate, regular rhythm and normal heart sounds.   Pulmonary/Chest: Effort normal and breath sounds normal. No respiratory distress. She has no wheezes.  Abdominal: Soft. Bowel sounds are normal. There is no tenderness. There is no guarding and no CVA tenderness.  Endorses "pressure" in her suprapubic region; no distention noted; abdomen soft, non-tender, no peritoneal signs  Musculoskeletal: Normal range of motion.  Neurological: She is alert and oriented to person, place, and time.  Skin: Skin  is warm and dry. She is not diaphoretic.  Psychiatric: She has a normal mood and affect.  Nursing note and vitals reviewed.   ED Course  Procedures (including critical care time) Labs Review Labs Reviewed  CBC WITH DIFFERENTIAL/PLATELET - Abnormal; Notable for the following:    WBC 3.7 (*)    Lymphs Abs 0.6 (*)    All other components within normal limits  COMPREHENSIVE METABOLIC PANEL - Abnormal; Notable for the following:    Calcium 8.5 (*)    Total Protein 6.2 (*)    All other components within normal limits  LIPASE, BLOOD - Abnormal; Notable for the following:    Lipase 16 (*)    All other components within normal limits  URINALYSIS, ROUTINE W REFLEX MICROSCOPIC (NOT AT [facility])    Imaging Review No results found.   EKG Interpretation None      MDM   Final diagnoses:  Nausea  vomiting and diarrhea   54 year old female here with abdominal pain and dysuria. Recent GI illness, known sick contacts. Patient is afebrile, nontoxic. She does appear fatigued. She endorses pressure in her suprapubic region, exam is benign without focal tenderness or peritoneal signs. She does have mildly dry mucous membranes admits to poor PO in take recently.  Will obtain labs and u/a.  IVF, zofran, morphine given.  Lab work is reassuring, no leukocytosis. UA without signs of infection. Patient was treated with IV fluids, Zofran, Reglan, morphine, and dilaudid with improvement of her symptoms.  She is currently tolerating PO without difficulty.  She still endorses somewhat of a cramping sensation in her upper abdomen, however abdomen remains non-tender on my exam. I suspect this is likely a viral etiology given her recent sick contacts, this may have exacerbated her IBS as well. I have reviewed patient's prior CT's, no evidence of diverticular disease and patient has no history of this-- lower suspicion for diverticulitis, colonic perforation, appendicitis, cholecystitis, or other acute abdominal  pathology at this time given her negative workup here after 1 week of ongoing symptoms. Patient has not followed up with her GI physician, Dr. Bosie Clos, and several months-- I have recommended that she follow-up with him in the next week.  Also recommend close PCP FU.  Will refill her hyoscyamine as she prefers the SL tablets vs the oral tablets.  Rx bentyl and zofran also given.  Discussed plan with patient, he/she acknowledged understanding and agreed with plan of care.  Return precautions given for new or worsening symptoms.  Garlon Hatchet, PA-C 07/07/15 1739  Richardean Canal, MD 07/08/15 [phone]

## 2015-07-07 NOTE — ED Notes (Signed)
Pt sts abd pain and distention with some nausea x several days; pt sts recent GI illness; pt sts some issues with urination as well

## 2015-07-07 NOTE — Discharge Instructions (Signed)
Your lab work today was normal. Take the prescribed medication as directed for continued symptomatic control.  Recommend to start with bland diet and progress back to normal as tolerated. Follow-up with Dr. Bosie Clos. Also recommend to follow-up with your primary care physician. Return to the ED for new or worsening symptoms.

## 2015-07-07 NOTE — Telephone Encounter (Signed)
Patient called Monday evening to make appointment stating she had LLQ pain.  States she also felt she may have UTI.  Stated she just didn't feel well.  She had norovirus last week that she caught from her grandchildren.  So, she felt that she was weak from that.  And, now she just doesn't feel well.  Advised that we would see her on 8/2 @ 11.    Patient called this a.m. At 10:20 stating that she was on the way to our office and was in so much pain that she thought maybe she should just go on to the ED.  Advised patient that yes, she should go straight to the nearest ED.  If she is in that much pain, there is nothing that we can do for her here in the office.  Patient instructed to head to the nearest ED.  Patient is very tearful on the phone and stating she wants to go to [facility] ED.  Advised patient that she should NOT go to [facility].  She needs to head to either [facility] or [facility].  Again reiterated to patient that she should head to the nearest of the 2 hospitals.    Made Dr. Lenord Fellers aware of the patient's call and removed patient from today's schedule.

## 2015-07-27 ENCOUNTER — Other Ambulatory Visit: Payer: Self-pay

## 2015-07-27 MED ORDER — DIMETHYL FUMARATE 240 MG PO CPDR: 240.0000 mg | DELAYED_RELEASE_CAPSULE | Freq: Two times a day (BID) | ORAL | Status: DC

## 2015-08-03 ENCOUNTER — Encounter: Payer: Self-pay | Admitting: *Deleted

## 2015-08-03 ENCOUNTER — Other Ambulatory Visit: Payer: Self-pay | Admitting: Neurology

## 2015-08-03 MED ORDER — HYDROCODONE-ACETAMINOPHEN 7.5-325 MG PO TABS: 1.0000 | ORAL_TABLET | Freq: Three times a day (TID) | ORAL | Status: DC | PRN

## 2015-08-03 NOTE — Telephone Encounter (Signed)
Patient called requesting refill for HYDROcodone-acetaminophen (NORCO) 7.5-325 MG per tablet . Patient advised RX will be ready within 24 hours unless otherwise informed by RN. Patient will be out tomorrow.

## 2015-08-03 NOTE — Telephone Encounter (Signed)
Dr [name] is out of the office.  Request entered, forwarded to WID for review.  

## 2015-08-03 NOTE — Progress Notes (Signed)
Hydrocodone rx. up front [facility]/fim 

## 2015-08-07 ENCOUNTER — Telehealth: Payer: Self-pay | Admitting: Internal Medicine

## 2015-08-07 NOTE — Telephone Encounter (Signed)
She is ready to initiate the process of having a knee replacement.  She wants to make an appointment to see you.  However, I wanted to see if you wanted to recommend her to see someone prior to seeing you or just make her an appointment to come in and see you to discuss this.  She has almost met her deductible and wants to go ahead and have this done.  She wants to see a "knee guru".  Can you recommend someone or do you need to see her.  State she has had the MRI, has had the injections, etc.  Is ready for the replacement.    Please advise.

## 2015-08-07 NOTE — Telephone Encounter (Signed)
I thought she had this worked out with orthopedist. What does she want me to do?

## 2015-08-11 NOTE — Telephone Encounter (Signed)
Patient is being seen at [facility] for other issues she will check with them to see who does knee replacements.

## 2015-08-19 ENCOUNTER — Other Ambulatory Visit: Payer: Self-pay | Admitting: Neurology

## 2015-08-19 ENCOUNTER — Encounter: Payer: Self-pay | Admitting: *Deleted

## 2015-08-19 MED ORDER — METHYLPHENIDATE HCL 20 MG PO TABS: ORAL_TABLET | ORAL | Status: DC

## 2015-08-19 MED ORDER — HYDROCODONE-ACETAMINOPHEN 7.5-325 MG PO TABS: 1.0000 | ORAL_TABLET | Freq: Three times a day (TID) | ORAL | Status: DC | PRN

## 2015-08-19 NOTE — Progress Notes (Signed)
Hydrocodone and Ritalin rx's up front [facility]/fim 

## 2015-08-19 NOTE — Telephone Encounter (Signed)
Patient called requesting refill for methylphenidate (RITALIN) 20 MG tablet and HYDROcodone-acetaminophen (NORCO) 7.5-325 MG per tablet . She said it is early for Hydrocodone but is wondering if Dr Epimenio Foot will date it so her husband can pick both up since she doesn't live close. Patient advised RX will be ready within 24 hours unless informed otherwise by RN.

## 2015-08-19 NOTE — Telephone Encounter (Signed)
Request forwarded to provider for review.

## 2015-09-10 ENCOUNTER — Telehealth: Payer: Self-pay | Admitting: Neurology

## 2015-09-10 NOTE — Telephone Encounter (Signed)
Patient called to advise that she received a jury summons and they need her to "enclose documentation of disability and why it prevents her from serving and a letter from a licensed Physician explaining disability and how it affects her ability to serve. Simple note from Doctor is not adequate" Juror# [id number] [facility] [address] Wednesday November 16th, 2016 8:30am. Call patient at [phone] if questions.

## 2015-09-11 ENCOUNTER — Encounter: Payer: Self-pay | Admitting: *Deleted

## 2015-09-11 ENCOUNTER — Other Ambulatory Visit: Payer: Self-pay | Admitting: Neurology

## 2015-09-11 MED ORDER — METHYLPHENIDATE HCL 20 MG PO TABS: ORAL_TABLET | ORAL | Status: DC

## 2015-09-11 NOTE — Telephone Encounter (Signed)
Patient called to request refill methylphenidate (RITALIN) 20 MG tablet

## 2015-09-11 NOTE — Telephone Encounter (Signed)
Letter mailed/fim

## 2015-09-11 NOTE — Telephone Encounter (Signed)
I have spoken with [name] this morning and advised that RAS can provide a letter asking that she be excused from jury duty, but if she need office notes faxed to the courthouse, she would need to come in and sign a med. rec. release form.  She verbalized understanding of same, asked that letter be prepared and mailed to her at her home address.  Letter printed, on RAS ledge to be signed/fim

## 2015-09-11 NOTE — Progress Notes (Signed)
Ritalin rx. up front [facility]/fim 

## 2015-09-24 ENCOUNTER — Other Ambulatory Visit: Payer: Commercial Managed Care - HMO | Admitting: Internal Medicine

## 2015-09-24 DIAGNOSIS — R5382 Chronic fatigue, unspecified: Secondary | ICD-10-CM

## 2015-09-24 LAB — CBC WITH DIFFERENTIAL/PLATELET
Basophils Absolute: 0 10*3/uL (ref 0.0–0.1)
Basophils Relative: 0 % (ref 0–1)
Eosinophils Absolute: 0 10*3/uL (ref 0.0–0.7)
Eosinophils Relative: 1 % (ref 0–5)
HCT: 40.3 % (ref 36.0–46.0)
Hemoglobin: 13.6 g/dL (ref 12.0–15.0)
Lymphocytes Relative: 23 % (ref 12–46)
Lymphs Abs: 0.8 10*3/uL (ref 0.7–4.0)
MCH: 29.5 pg (ref 26.0–34.0)
MCHC: 33.7 g/dL (ref 30.0–36.0)
MCV: 87.4 fL (ref 78.0–100.0)
MPV: 9.9 fL (ref 8.6–12.4)
Monocytes Absolute: 0.3 10*3/uL (ref 0.1–1.0)
Monocytes Relative: 9 % (ref 3–12)
Neutro Abs: 2.3 10*3/uL (ref 1.7–7.7)
Neutrophils Relative %: 67 % (ref 43–77)
Platelets: 255 10*3/uL (ref 150–400)
RBC: 4.61 MIL/uL (ref 3.87–5.11)
RDW: 14 % (ref 11.5–15.5)
WBC: 3.5 10*3/uL — ABNORMAL LOW (ref 4.0–10.5)

## 2015-09-24 LAB — COMPREHENSIVE METABOLIC PANEL
ALT: 14 U/L (ref 6–29)
AST: 19 U/L (ref 10–35)
Albumin: 4.4 g/dL (ref 3.6–5.1)
Alkaline Phosphatase: 69 U/L (ref 33–130)
BUN: 17 mg/dL (ref 7–25)
CO2: 27 mmol/L (ref 20–31)
Calcium: 9.1 mg/dL (ref 8.6–10.4)
Chloride: 102 mmol/L (ref 98–110)
Creat: 0.65 mg/dL (ref 0.50–1.05)
Glucose, Bld: 82 mg/dL (ref 65–99)
Potassium: 4.2 mmol/L (ref 3.5–5.3)
Sodium: 140 mmol/L (ref 135–146)
Total Bilirubin: 0.6 mg/dL (ref 0.2–1.2)
Total Protein: 6.9 g/dL (ref 6.1–8.1)

## 2015-09-25 ENCOUNTER — Other Ambulatory Visit: Payer: Self-pay | Admitting: Neurology

## 2015-09-25 ENCOUNTER — Telehealth: Payer: Self-pay | Admitting: Neurology

## 2015-09-25 LAB — TSH: TSH: 0.943 u[IU]/mL (ref 0.350–4.500)

## 2015-09-25 MED ORDER — HYDROCODONE-ACETAMINOPHEN 7.5-325 MG PO TABS: 1.0000 | ORAL_TABLET | Freq: Three times a day (TID) | ORAL | Status: DC | PRN

## 2015-09-25 NOTE — Telephone Encounter (Signed)
Patient is calling to askt if a  form has been received stating that the patient can be released for surgery. Please call patient and advise. Thank you.

## 2015-09-25 NOTE — Telephone Encounter (Signed)
I have spoken with [name] this morning and advised that release for surgery has been completed and faxed back/fim

## 2015-09-25 NOTE — Telephone Encounter (Signed)
Patient  is calling to get a written Rx for HYDROcodone-acetaminophen (NORCO) 7.5-325 MG per tablet. I advised the Rx will be ready in 24 hours unless otherwise advised by nurse.  Thank you.

## 2015-09-28 ENCOUNTER — Encounter: Payer: Self-pay | Admitting: *Deleted

## 2015-09-28 ENCOUNTER — Ambulatory Visit: Payer: Commercial Managed Care - HMO | Admitting: Internal Medicine

## 2015-09-28 NOTE — Progress Notes (Signed)
Hydrocodone rx. up front [facility]/fim 

## 2015-09-30 ENCOUNTER — Encounter: Payer: Self-pay | Admitting: Internal Medicine

## 2015-09-30 ENCOUNTER — Ambulatory Visit (INDEPENDENT_AMBULATORY_CARE_PROVIDER_SITE_OTHER): Payer: Commercial Managed Care - HMO | Admitting: Internal Medicine

## 2015-09-30 ENCOUNTER — Other Ambulatory Visit: Payer: Self-pay

## 2015-09-30 VITALS — BP 128/78 | HR 74 | Temp 97.4°F | Resp 18 | Ht 65.0 in | Wt 159.0 lb

## 2015-09-30 DIAGNOSIS — M542 Cervicalgia: Secondary | ICD-10-CM

## 2015-09-30 DIAGNOSIS — R4189 Other symptoms and signs involving cognitive functions and awareness: Secondary | ICD-10-CM | POA: Diagnosis not present

## 2015-09-30 DIAGNOSIS — R5382 Chronic fatigue, unspecified: Secondary | ICD-10-CM | POA: Diagnosis not present

## 2015-09-30 DIAGNOSIS — M545 Low back pain, unspecified: Secondary | ICD-10-CM

## 2015-09-30 DIAGNOSIS — Z8679 Personal history of other diseases of the circulatory system: Secondary | ICD-10-CM

## 2015-09-30 DIAGNOSIS — M1712 Unilateral primary osteoarthritis, left knee: Secondary | ICD-10-CM | POA: Diagnosis not present

## 2015-09-30 DIAGNOSIS — G35 Multiple sclerosis: Secondary | ICD-10-CM | POA: Diagnosis not present

## 2015-09-30 DIAGNOSIS — G8929 Other chronic pain: Secondary | ICD-10-CM

## 2015-09-30 DIAGNOSIS — R269 Unspecified abnormalities of gait and mobility: Secondary | ICD-10-CM

## 2015-09-30 DIAGNOSIS — D702 Other drug-induced agranulocytosis: Secondary | ICD-10-CM | POA: Diagnosis not present

## 2015-09-30 DIAGNOSIS — M791 Myalgia: Secondary | ICD-10-CM

## 2015-09-30 DIAGNOSIS — Z8701 Personal history of pneumonia (recurrent): Secondary | ICD-10-CM | POA: Diagnosis not present

## 2015-09-30 DIAGNOSIS — M7918 Myalgia, other site: Secondary | ICD-10-CM

## 2015-09-30 NOTE — Patient Instructions (Signed)
Surgically cleared for left knee replacement but will need close observation postoperatively.

## 2015-09-30 NOTE — Progress Notes (Signed)
Subjective:    Patient ID: [name], female    DOB: November 28, 1961, 54 y.o.   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 Shelle Iron.   Patient tells me she plans to have a GYN exam by Dr. Marcelle Overlie, her gynecologist, in December 2016. Says mammogram is due in January 2017. Declines flu vaccine.  Patient is under the care of Dr. Epimenio Foot at [facility] for multiple sclerosis. She is being treated with gabapentin, Tecfidera, and Reglan. Reglan seems to help with her energy and cognitive issues. Previously was treated with Tysabri between February 2009 and 2013.  She is also seen Dr. Ethelene Hal for cervical and lumbar epidural steroid injections in December 2015. 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 History of cervical spondylosis C4-C5 and C6-C7.  She has been followed for some time for low white blood cell counts that have been stable. Could be due to multiple sclerosis medications. She takes hydrocodone/APAP several times daily. Takes Xanax as needed.  History of palpitations July 2015 seen at [facility] with normal EKG. Was treated with metoprolol. 2-D echocardiogram done July 2001 showed mild mitral valve prolapse.  History of lower extremity edema that seems to be related and aggravated by prednisone and anti-inflammatory medications.  In September 2011 she was admitted to [facility] for presumed meningitis. Lumbar puncture was performed because patient complained of headache. She was treated with IV antibiotics.  3  C-sections 1980, 1990, 1993. Hysterectomy and hernia repair in 2006.  History of irritable bowel syndrome treated with Levsin in May 2008 by gastroenterologist. Colonoscopy by Dr. Bosie Clos in 2007 which was normal.  History of bilateral pneumonia with white out of the right lung April 2011  requiring admission to [facility]. Admitted to [facility] October 2009 with chest pain. At that time she was afebrile and had flulike symptoms. MI was ruled out. In November 2009 she was admitted to [facility] with right middle lobe and right lower lobe pneumonia. In October 2011 she was diagnosed with pneumonia and was admitted to [facility].  In 2008, diagnosed with multiple sclerosis, she had an enhancing lesion in her pons. MS lesions were noted in her spinal cord at C2-C3, C5-C6 and probably C8-T1 according to Dr. Reva Bores note at [facility]. He was her treating Neurologist at the time. His notes also say in late 2007 she developed periods of slurred speech, decreased cognition and double vision. Presenting symptoms were chronic pain in her neck, arms, back and legs. Initially thought to have fibromyalgia and musculoskeletal pain related to her dancing. She was started on Avonex, Neurontin, Provigil. She applied and received Disability. Avonex was discontinued in May 2008.   In 2004 she saw Dr. Marcelyn Bruins, urologist here in [place] and was diagnosed with pelvic floor dysfunction. He did not think she had interstitial cystitis. Cystoscopy was performed. Bladder was normal.  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  History of right hepatic hemangioma.  Treated for right middle lobe pneumonia June 2016.  History of paroxysmal atrial fibrillation evaluated by Dr. Jens Som. Initial episode July 2015 and recurrent episode January 2016. Echocardiogram January 2016 showed normal LV function and mild left atrial enlargement. Nuclear study March 2016 showed ejection fraction 55% and normal perfusion. TSH checked March 2016 was normal. Has been treated  with metoprolol. Recommendations were to treat her with antiarrhythmic in the future if symptoms became more frequent such as black and 9. Aspirin was recommended but patient declined.  It was not recommended to treat her with other anticoagulants. Dr. Jens Som noted in his consultation April 2016 she would be at risk for postoperative atrial fibrillation and would treat with beta blocker pre-and postoperatively.  Social history: She is married to a retired Company secretary. 3 adult children, 2 sons and a daughter. Daughter has 5 children. Patient is retired Dietitian. Does not smoke or consume alcohol. She has a degree and dance from [employer].  Family history: Father died at age 21 with history of COPD and perhaps MI. Mother died at age 27 of a stroke. Mother with history of diabetes and hypertension. One brother with history of ulcerative colitis and some type of chronic lung disease related to occupational exposure as well as diabetes. Another brother age 58 in good health. No sisters.    Review of Systems issues with musculoskeletal pain particularly left anterior thigh. Feels that Ritalin helps her walk better and express herself better. Says that she has episodic forgetfulness better on some days than others. Feels foggy sometimes and cannot express herself well. Tends to repeat herself. Ambulates slowly.     Objective:   Physical Exam  Constitutional: She appears well-developed and well-nourished. No distress.  HENT:  Head: Normocephalic and atraumatic.  Right Ear: External ear normal.  Left Ear: External ear normal.  Mouth/Throat: Oropharynx is clear and moist. No oropharyngeal exudate.  Eyes: Conjunctivae and EOM are normal. Pupils are equal, round, and reactive to light. Right eye exhibits no discharge. Left eye exhibits no discharge. No scleral icterus.  Neck: Neck supple. No JVD present. No thyromegaly present.  Cardiovascular: Normal rate, regular rhythm, normal heart sounds and intact distal pulses.   No murmur heard. Pulmonary/Chest: Effort normal and breath sounds normal. She has no wheezes. She has no rales.  Breasts normal female without masses  Abdominal:  Soft. Bowel sounds are normal. She exhibits no distension and no mass. There is no rebound and no guarding.  Genitourinary:  Deferred to GYN  Musculoskeletal: She exhibits no edema.  Crepitus left knee  Lymphadenopathy:    She has no cervical adenopathy.  Neurological: She is alert. No cranial nerve deficit. She exhibits normal muscle tone.  Speaks slowly. Alert and oriented 3. Muscle strength is good in the upper and lower extremities  Skin: Skin is warm and dry. No rash noted. She is not diaphoretic.  Psychiatric: Judgment normal.  Affect is slightly flat. Mood stable. Speaks slowly.  Vitals reviewed.         Assessment & Plan:  Relapsing remitting multiple sclerosis treated with Tecfidera  End-stage left knee osteoarthritis-in need of left knee replacement  History of multiple episodes of pneumonia-needs to be watched closely in the hospital and in rehabilitation  Chronic fatigue  Cognitive deficits- from multiple sclerosis  Chronic lumbar pain-treated by Dr. Ethelene Hal  Gait abnormality-for multiple sclerosis  Chronic neck pain  History of atrial fibrillation 2015 in 2016. Dr. Jens Som had previously recommended treating her pre-and postoperatively with beta blocker due to history of paroxysmal atrial fib and increased risk perioperatively of PAF.  History of mild neutropenia likely due to Tecfidera  Plan: Patient is cleared medically for left knee replacement. She will need close observation and it would perhaps be best to have medical consultation with hospitalist while she is recuperating postoperatively. She plans to go  to a rehabilitation center postoperatively. and needs to make inquiries as to where she would like to be. She is motivated to do rehabilitation. She will need to be watched closely for pneumonia.

## 2015-10-05 ENCOUNTER — Ambulatory Visit: Payer: Self-pay | Admitting: Orthopedic Surgery

## 2015-10-15 ENCOUNTER — Other Ambulatory Visit: Payer: Self-pay | Admitting: Neurology

## 2015-10-15 MED ORDER — METHYLPHENIDATE HCL 20 MG PO TABS: ORAL_TABLET | ORAL | Status: DC

## 2015-10-15 NOTE — Telephone Encounter (Signed)
Patient called to request refill of methylphenidate (RITALIN) 20 MG tablet °

## 2015-10-26 ENCOUNTER — Ambulatory Visit (INDEPENDENT_AMBULATORY_CARE_PROVIDER_SITE_OTHER): Payer: Commercial Managed Care - HMO | Admitting: Neurology

## 2015-10-26 ENCOUNTER — Encounter: Payer: Self-pay | Admitting: Neurology

## 2015-10-26 VITALS — BP 128/80 | HR 68 | Resp 14 | Ht 65.0 in | Wt 161.0 lb

## 2015-10-26 DIAGNOSIS — R3911 Hesitancy of micturition: Secondary | ICD-10-CM | POA: Diagnosis not present

## 2015-10-26 DIAGNOSIS — R5382 Chronic fatigue, unspecified: Secondary | ICD-10-CM

## 2015-10-26 DIAGNOSIS — G35 Multiple sclerosis: Secondary | ICD-10-CM | POA: Diagnosis not present

## 2015-10-26 DIAGNOSIS — F4323 Adjustment disorder with mixed anxiety and depressed mood: Secondary | ICD-10-CM | POA: Diagnosis not present

## 2015-10-26 DIAGNOSIS — R269 Unspecified abnormalities of gait and mobility: Secondary | ICD-10-CM

## 2015-10-26 DIAGNOSIS — F909 Attention-deficit hyperactivity disorder, unspecified type: Secondary | ICD-10-CM | POA: Diagnosis not present

## 2015-10-26 DIAGNOSIS — F988 Other specified behavioral and emotional disorders with onset usually occurring in childhood and adolescence: Secondary | ICD-10-CM

## 2015-10-26 MED ORDER — METHYLPHENIDATE HCL 20 MG PO TABS: ORAL_TABLET | ORAL | Status: DC

## 2015-10-26 MED ORDER — HYOSCYAMINE SULFATE 0.125 MG SL SUBL: 0.1250 mg | SUBLINGUAL_TABLET | SUBLINGUAL | Status: DC | PRN

## 2015-10-26 MED ORDER — ALPRAZOLAM 0.5 MG PO TABS: 0.5000 mg | ORAL_TABLET | Freq: Three times a day (TID) | ORAL | Status: DC | PRN

## 2015-10-26 MED ORDER — GABAPENTIN 600 MG PO TABS: 600.0000 mg | ORAL_TABLET | Freq: Four times a day (QID) | ORAL | Status: DC

## 2015-10-26 MED ORDER — HYDROCODONE-ACETAMINOPHEN 7.5-325 MG PO TABS: 2.0000 | ORAL_TABLET | Freq: Three times a day (TID) | ORAL | Status: DC

## 2015-10-26 NOTE — Progress Notes (Signed)
[facility]  PATIENT: [name] DOB: 12/01/1961 REASON FOR VISIT: Multiple sclerosis   HISTORICAL  CHIEF COMPLAINT:  Chief Complaint  Patient presents with  . Multiple Sclerosis    Sts. she continues to tolerate Tecfidera well.  She denies new or worsening sx.  Is scheduled for left knee replacement on 11-05-15/fim    HISTORY OF PRESENT ILLNESS:  [name] is a 54 year old woman with multiple sclerosis since 2008 who reports no new symptoms today.    She has her chronic issues with gait, bladder, fatigue and pain.     She tolerates Tecfidera well and she has not had any major exacerbation while she has been on it.     Gait/Strength/sensation:   She has gait ataxia and right foot drop.   This has worsened some this year.      She reports right greater than left leg clumsiness. She has fallen a few times in the past but none recently.   She uses a cane at times and sometimes uses a wheelchair for shopping.   She has dysesthesias in her feet (left > right like a stocking that is tight and burning more symmetrically) and milder in her hands.   Pain radiates down her legs at times.   She gets a tight sensation and a poking sensation in the thoracic spine region  Bladder:   She has bladder dysfunction that is likely related to the MS.  Tamsulosin for hesitancy was poorly tolerated.  She has seen urology.   She wears a pad for occasional incontinence.   Vision:    She has occasional diplopia, especially when she is tired or with gabapentin.   She notes decreased acuity at night compared to a few years ago.   An early exacerbation was diplopia.     Fatigue/sleep:   She reports difficulties with fatigue that is both physical and cognitive. She also has difficulties with attention. She feels both physical and cognitive fatigue and her attentional problems improved when she takes Ritalin. She is currently on the Ritalin 20 mg by mouth 3 times a day (usually 40 mg in am and  20 at noon).    She sleeps well with a Xanax at night and gabapentin  Mood/cognition:  She has had severe depression in the past but is doing much better.   She stopped Pristiq. She feels better off of it.  She has mild anxiety, helped by  Xanax .   She notes a brain fog at times  She has some word finding problems and decreased attention.   Methylphenidate has helped a lot and she tolerates it well.      Pain:   She has TKR scheduled 11/05/2015 and then has rehab for a week afterwards.   She also reports lumbar spinal stenosis  MS History:  In 2008, she had an MRI of the brain performed which was consistent with multiple sclerosis and she was diagnosed with relapsing remitting MS. He was initially placed on Avonex for therapy. She had some exacerbations with weakness and clumsiness with her gait and she started to see Dr. Leotis Shames. He placed her on Tysabri she is on Tysabri between 2010 and 2013. 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      REVIEW OF SYSTEMS:  Constitutional: No fevers, chills, sweats, or change in appetite.  She notes fatigue Eyes:  No visual changes,eye pain.   She has intermittent diplopia Ear, nose and throat: No hearing loss, ear pain, nasal congestion, sore throat Cardiovascular: No chest pain, palpitations Respiratory:  No shortness of breath at rest or with exertion.   No wheezes GastrointestinaI: No nausea, vomiting, diarrhea, abdominal pain, fecal incontinence Genitourinary:  a sabove Musculoskeletal:  she has both neck pain and back pain. Also bilat knee pain Integumentary: No rash, pruritus, skin lesions.   She has Raynauds in her hands  Neurological: as above Psychiatric: No depression at this time.  Anxiety doing ok Endocrine: No palpitations, diaphoresis, change in appetite, change in weigh or increased thirst Hematologic/Lymphatic:  No anemia, purpura,  petechiae. Allergic/Immunologic: No itchy/runny eyes, nasal congestion, recent allergic reactions, rashes  ALLERGIES: Allergies  Allergen Reactions  . Ibuprofen Other (See Comments)    CAUSES LYMPHEDEMA  . Nsaids Other (See Comments)    AVOIDS ANY SODIUM CONTAINING MED. -- CAUSES LYMPHEDEMA  . Other Other (See Comments)    Laxatives- causes leg swelling    HOME MEDICATIONS: Outpatient Prescriptions Prior to Visit  Medication Sig Dispense Refill  . ALPRAZolam (XANAX) 0.5 MG tablet Take 1 tablet (0.5 mg total) by mouth 3 (three) times daily as needed for anxiety or sleep. (Patient taking differently: Take 0.5-1 mg by mouth daily. Take at 8 PM) 90 tablet 5  . Calcium-Magnesium-Vitamin D (CALCIUM MAGNESIUM PO) Take 4 capsules by mouth at bedtime.    . Dimethyl Fumarate (TECFIDERA) 240 MG CPDR Take 1 capsule (240 mg total) by mouth 2 (two) times daily. 180 capsule 1  . gabapentin (NEURONTIN) 600 MG tablet TAKE 1 TABLET BY MOUTH 4 TIMES A DAY (Patient taking differently: take 300 mg at 6 PM; take  to 1200 mg at 8pm based on severity of pain) 120 tablet 6  . HYDROcodone-acetaminophen (NORCO) 7.5-325 MG tablet Take 1-2 tablets by mouth every 8 (eight) hours as needed. (Patient taking differently: Take 2 tablets by mouth every 8 (eight) hours. ) 180 tablet 0  . hyoscyamine (LEVSIN SL) 0.125 MG SL tablet Place 1 tablet (0.125 mg total) under the tongue every 4 (four) hours as needed. (Patient taking differently: Place 0.125 mg under the tongue every 4 (four) hours as needed for cramping. ) 30 tablet 0  . methylphenidate (RITALIN) 20 MG tablet 40 mg in the morning and 20 mg with lunch. (Patient taking differently: Take 20-40 mg by mouth 2 (two) times daily. 40 mg in the morning and 20 mg with lunch.) 90 tablet 0  . metoprolol (LOPRESSOR) 50 MG tablet Take 0.5 tablets (25 mg total) by mouth 2 (two) times daily as needed (racing heartbeat). 30 tablet 0   No facility-administered medications prior  to visit.    PAST MEDICAL HISTORY: Past Medical History  Diagnosis Date  . MVP (mitral valve prolapse) HX HEART RACING  YRS AGO    ASYMPTOMATIC SINCE THEN  . Weakness of right leg SECONDARY TO MS  . History of concussion YOUNG ADULT--  NO RESIDUAL  . Headache(784.0)   . History of edema PERIPHERAL LYMPHEDEMA  . Arthritis   . Bone spur   . MS (multiple sclerosis) (HCC)   . 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 Movement disorder   . Vision abnormalities   . Atrial fibrillation (HCC)     PAST SURGICAL HISTORY: Past Surgical History  Procedure Laterality Date  . Destruction of anal condyloma  07-07-2005  DR TOTH  . Laparoscopic assisted vaginal hysterectomy  01-15-2002  DR Gerlene Burdock HOLLAND/ DR TIM DAVIS    AND REPAIR VENTRAL HERNIA  . Cesarean section  X3    BILATERAL TUBAL LIGATION W/ LAST ONE  . Knee arthroscopy with lateral menisectomy  10/26/2012    Procedure: KNEE ARTHROSCOPY WITH LATERAL MENISECTOMY;  Surgeon: Javier Docker, MD;  Location: [facility];  Service: Orthopedics;  Laterality: Left;  Left Knee Arthrsocopy with Partial Lateral Menisectomy  . Laser ablation condolamata N/A 04/26/2013    Procedure: LASER ABLATION CONDOLAMATA;  Surgeon: Romie Levee, MD;  Location: [facility];  Service: General;  Laterality: N/A;  . Tonsillectomy      FAMILY HISTORY: Family History  Problem Relation Age of Onset  . Diabetes Mother   . Hypertension Mother   . Stroke Mother   . Bowel Disease Brother   . Diabetes Brother   . Healthy Brother   . Emphysema Father   . Heart attack Father     SOCIAL HISTORY:  Social History   Social History  . Marital Status: Married    Spouse Name: N/A  . Number of Children: 3  . Years of Education: N/A   Occupational History  . Not on file.   Social History Main Topics  . Smoking status: Never Smoker   . Smokeless tobacco: Never Used  . Alcohol Use: No  . Drug Use: No  . Sexual  Activity: Not on file   Other Topics Concern  . Not on file   Social History Narrative     PHYSICAL EXAM  Filed Vitals:   10/26/15 1036  BP: 128/80  Pulse: 68  Resp: 14  Height: 5\' 5"  (1.651 m)  Weight: 161 lb (73.029 kg)    Body mass index is 26.79 kg/(m^2).   General: The patient is well-developed and well-nourished and in no acute distress   Neurologic Exam  Mental status: The patient is alert and oriented x 3 at the time of the examination. The patient has apparent normal recent and remote memory, with an apparently normal attention span and concentration ability.   Speech is normal.  Cranial nerves: Extraocular movements are full.   Facial symmetry is present. There is good facial sensation to soft touch bilaterally.   Facial strength is normal.  Trapezius and sternocleidomastoid strength is normal. No dysarthria is noted.  The tongue is midline, and the patient has symmetric elevation of the soft palate.   Motor:  Muscle bulk is normal and tone is minimally increased in both legs. Strength is  5 / 5 in all 4 extremities.   Sensory: Sensory testing is intact to  soft touch and vibration sensation on all 4 extremities.  Coordination: Cerebellar testing reveals good finger-nose-finger bilaterally.  Gait and station: Station is normal and gait is arthritic. Tandem gait is wide. Romberg is negative.   Reflexes: Deep tendon reflexes are symmetric and brisk bilaterally.      DIAGNOSTIC DATA (LABS, IMAGING, TESTING) - I reviewed patient records, labs, notes, testing and imaging myself where available.  Lab Results  Component Value Date   WBC 3.5* 09/24/2015   HGB 13.6 09/24/2015   HCT 40.3 09/24/2015   MCV 87.4 09/24/2015   PLT 255 09/24/2015      Component Value Date/Time   NA 140 09/24/2015 1149   K 4.2 09/24/2015 1149   CL 102 09/24/2015 1149   CO2 27 09/24/2015 1149   GLUCOSE 82 09/24/2015 1149   BUN 17 09/24/2015 1149   CREATININE 0.65  09/24/2015 1149    CREATININE 0.80 07/07/2015 1224   CALCIUM 9.1 09/24/2015 1149   PROT 6.9 09/24/2015 1149   ALBUMIN 4.4 09/24/2015 1149   AST 19 09/24/2015 1149   ALT 14 09/24/2015 1149   ALKPHOS 69 09/24/2015 1149   BILITOT 0.6 09/24/2015 1149   GFRNONAA >60 07/07/2015 1224   GFRAA >60 07/07/2015 1224   Lab Results  Component Value Date   CHOL 196 12/25/2014   HDL 92 12/25/2014   LDLCALC 91 12/25/2014   TRIG 65 12/25/2014   CHOLHDL 2.1 12/25/2014   Lab Results  Component Value Date   HGBA1C  03/10/2010    5.5 (NOTE) The ADA recommends the following therapeutic goal for glycemic control related to Hgb A1c measurement: Goal of therapy: <6.5 Hgb A1c  Reference: American Diabetes Association: Clinical Practice Recommendations 2010, Diabetes Care, 2010, 33: (Suppl  1).      ASSESSMENT AND PLAN  Multiple sclerosis (HCC)  Abnormality of gait  Attention deficit disorder  Chronic fatigue  Urinary hesitancy  Adjustment disorder with mixed anxiety and depressed mood    1.   Check MRI of the brain and cervical spine to rule out subclinical progression of MS. If present, consider change to a different medication. 2.  Refill ritalin, hydrocodone.   Continue other med's 3.   Remain active, try to exercise 4.   Continue Tecfidera    check CBC with diff to rule out lymphopenia.   rtc 4-6 months, sooner if problems   Richard A. Epimenio Foot, MD, PhD 10/26/2015, 10:51 AM Certified in Neurology, Clinical Neurophysiology, Sleep Medicine, Pain Medicine and Neuroimaging  [facility] [address], [address] [place], [place] [address] [phone]

## 2015-10-27 ENCOUNTER — Encounter (HOSPITAL_COMMUNITY)
Admission: RE | Admit: 2015-10-27 | Discharge: 2015-10-27 | Disposition: A | Discharge status: Home / Self Care | Payer: Commercial Managed Care - HMO | Source: Ambulatory Visit | Attending: Specialist | Admitting: Specialist

## 2015-10-27 ENCOUNTER — Ambulatory Visit: Payer: Commercial Managed Care - HMO | Admitting: Neurology

## 2015-10-27 ENCOUNTER — Ambulatory Visit (HOSPITAL_COMMUNITY)
Admission: RE | Admit: 2015-10-27 | Discharge: 2015-10-27 | Disposition: A | Discharge status: Home / Self Care | Payer: Commercial Managed Care - HMO | Source: Ambulatory Visit | Attending: Orthopedic Surgery | Admitting: Orthopedic Surgery

## 2015-10-27 ENCOUNTER — Encounter (HOSPITAL_COMMUNITY): Payer: Self-pay

## 2015-10-27 DIAGNOSIS — Z01812 Encounter for preprocedural laboratory examination: Secondary | ICD-10-CM | POA: Insufficient documentation

## 2015-10-27 DIAGNOSIS — M1712 Unilateral primary osteoarthritis, left knee: Secondary | ICD-10-CM

## 2015-10-27 DIAGNOSIS — Z01818 Encounter for other preprocedural examination: Secondary | ICD-10-CM | POA: Insufficient documentation

## 2015-10-27 HISTORY — DX: Other specified behavioral and emotional disorders with onset usually occurring in childhood and adolescence: F98.8

## 2015-10-27 HISTORY — DX: Unspecified urinary incontinence: R32

## 2015-10-27 HISTORY — DX: Chronic fatigue, unspecified: R53.82

## 2015-10-27 HISTORY — DX: Irritable bowel syndrome, unspecified: K58.9

## 2015-10-27 HISTORY — DX: Cardiac arrhythmia, unspecified: I49.9

## 2015-10-27 HISTORY — DX: Anxiety disorder, unspecified: F41.9

## 2015-10-27 LAB — URINALYSIS, ROUTINE W REFLEX MICROSCOPIC
Bilirubin Urine: NEGATIVE
Glucose, UA: NEGATIVE mg/dL
Hgb urine dipstick: NEGATIVE
Ketones, ur: NEGATIVE mg/dL
Leukocytes, UA: NEGATIVE
Nitrite: NEGATIVE
Protein, ur: NEGATIVE mg/dL
Specific Gravity, Urine: 1.02 (ref 1.005–1.030)
pH: 6 (ref 5.0–8.0)

## 2015-10-27 LAB — PROTIME-INR
INR: 1.01 (ref 0.00–1.49)
Prothrombin Time: 13.5 seconds (ref 11.6–15.2)

## 2015-10-27 LAB — BASIC METABOLIC PANEL
Anion gap: 7 (ref 5–15)
BUN: 16 mg/dL (ref 6–20)
CO2: 32 mmol/L (ref 22–32)
Calcium: 9.7 mg/dL (ref 8.9–10.3)
Chloride: 101 mmol/L (ref 101–111)
Creatinine, Ser: 0.79 mg/dL (ref 0.44–1.00)
GFR calc Af Amer: >60 mL/min (ref >60)
GFR calc non Af Amer: >60 mL/min (ref >60)
Glucose, Bld: 107 mg/dL — ABNORMAL HIGH (ref 65–99)
Potassium: 4.5 mmol/L (ref 3.5–5.1)
Sodium: 140 mmol/L (ref 135–145)

## 2015-10-27 LAB — CBC
HCT: 44.1 % (ref 36.0–46.0)
Hemoglobin: 14.6 g/dL (ref 12.0–15.0)
MCH: 30.5 pg (ref 26.0–34.0)
MCHC: 33.1 g/dL (ref 30.0–36.0)
MCV: 92.1 fL (ref 78.0–100.0)
Platelets: 256 10*3/uL (ref 150–400)
RBC: 4.79 MIL/uL (ref 3.87–5.11)
RDW: 13.1 % (ref 11.5–15.5)
WBC: 2.4 10*3/uL — ABNORMAL LOW (ref 4.0–10.5)

## 2015-10-27 LAB — SURGICAL PCR SCREEN
MRSA, PCR: NEGATIVE
Staphylococcus aureus: NEGATIVE

## 2015-10-27 LAB — ABO/RH: ABO/RH(D): A POS

## 2015-10-27 NOTE — Patient Instructions (Addendum)
YOUR PROCEDURE IS SCHEDULED ON : 11/05/15  REPORT TO [facility] MAIN ENTRANCE FOLLOW SIGNS TO EAST ELEVATOR - GO TO 3rd FLOOR CHECK IN AT 3 EAST NURSES STATION (SHORT STAY) AT: 5:30 am  CALL THIS NUMBER IF YOU HAVE PROBLEMS THE MORNING OF SURGERY [phone]  REMEMBER:ONLY 1 PER PERSON MAY GO TO SHORT STAY WITH YOU TO GET READY THE MORNING OF YOUR SURGERY  DO NOT EAT FOOD OR DRINK LIQUIDS AFTER MIDNIGHT  TAKE THESE MEDICINES THE MORNING OF SURGERY:  TECFIDERA / HYDROCODONE / RITALIN  YOU MAY NOT HAVE ANY METAL ON YOUR BODY INCLUDING HAIR PINS AND PIERCING'S. DO NOT WEAR JEWELRY, MAKEUP, LOTIONS, POWDERS OR PERFUMES. DO NOT WEAR NAIL POLISH. DO NOT SHAVE 48 HRS PRIOR TO SURGERY. MEN MAY SHAVE FACE AND NECK.  DO NOT BRING VALUABLES TO HOSPITAL. [facility] IS NOT RESPONSIBLE FOR VALUABLES.  CONTACTS, DENTURES OR PARTIALS MAY NOT BE WORN TO SURGERY. LEAVE SUITCASE IN CAR. CAN BE BROUGHT TO ROOM AFTER SURGERY.  PATIENTS DISCHARGED THE DAY OF SURGERY WILL NOT BE ALLOWED TO DRIVE HOME.  PLEASE READ OVER THE FOLLOWING INSTRUCTION SHEETS _________________________________________________________________________________                                          [place] - PREPARING FOR SURGERY  Before surgery, you can play an important role.  Because skin is not sterile, your skin needs to be as free of germs as possible.  You can reduce the number of germs on your skin by washing with CHG (chlorahexidine gluconate) soap before surgery.  CHG is an antiseptic cleaner which kills germs and bonds with the skin to continue killing germs even after washing. Please DO NOT use if you have an allergy to CHG or antibacterial soaps.  If your skin becomes reddened/irritated stop using the CHG and inform your nurse when you arrive at Short Stay. Do not shave (including legs and underarms) for at least 48 hours prior to the first CHG shower.  You may shave your face. Please follow  these instructions carefully:   1.  Shower with CHG Soap the night before surgery and the  morning of Surgery.   2.  If you choose to wash your hair, wash your hair first as usual with your  normal  Shampoo.   3.  After you shampoo, rinse your hair and body thoroughly to remove the  shampoo.                                         4.  Use CHG as you would any other liquid soap.  You can apply chg directly  to the skin and wash . Gently wash with scrungie or clean wascloth    5.  Apply the CHG Soap to your body ONLY FROM THE NECK DOWN.   Do not use on open                           Wound or open sores. Avoid contact with eyes, ears mouth and genitals (private parts).                        Genitals (private parts) with your normal  soap.              6.  Wash thoroughly, paying special attention to the area where your surgery  will be performed.   7.  Thoroughly rinse your body with warm water from the neck down.   8.  DO NOT shower/wash with your normal soap after using and rinsing off  the CHG Soap .                9.  Pat yourself dry with a clean towel.             10.  Wear clean night clothes to bed after shower             11.  Place clean sheets on your bed the night of your first shower and do not  sleep with pets.  Day of Surgery : Do not apply any lotions/deodorants the morning of surgery.  Please wear clean clothes to the hospital/surgery center.  FAILURE TO FOLLOW THESE INSTRUCTIONS MAY RESULT IN THE CANCELLATION OF YOUR SURGERY    PATIENT SIGNATURE_________________________________  ______________________________________________________________________     [name]  An incentive spirometer is a tool that can help keep your lungs clear and active. This tool measures how well you are filling your lungs with each breath. Taking long deep breaths may help reverse or decrease the chance of developing breathing (pulmonary) problems (especially infection)  following:  A long period of time when you are unable to move or be active. BEFORE THE PROCEDURE   If the spirometer includes an indicator to show your best effort, your nurse or respiratory therapist will set it to a desired goal.  If possible, sit up straight or lean slightly forward. Try not to slouch.  Hold the incentive spirometer in an upright position. INSTRUCTIONS FOR USE  1. Sit on the edge of your bed if possible, or sit up as far as you can in bed or on a chair. 2. Hold the incentive spirometer in an upright position. 3. Breathe out normally. 4. Place the mouthpiece in your mouth and seal your lips tightly around it. 5. Breathe in slowly and as deeply as possible, raising the piston or the ball toward the top of the column. 6. Hold your breath for 3-5 seconds or for as long as possible. Allow the piston or ball to fall to the bottom of the column. 7. Remove the mouthpiece from your mouth and breathe out normally. 8. Rest for a few seconds and repeat Steps 1 through 7 at least 10 times every 1-2 hours when you are awake. Take your time and take a few normal breaths between deep breaths. 9. The spirometer may include an indicator to show your best effort. Use the indicator as a goal to work toward during each repetition. 10. After each set of 10 deep breaths, practice coughing to be sure your lungs are clear. If you have an incision (the cut made at the time of surgery), support your incision when coughing by placing a pillow or rolled up towels firmly against it. Once you are able to get out of bed, walk around indoors and cough well. You may stop using the incentive spirometer when instructed by your caregiver.  RISKS AND COMPLICATIONS  Take your time so you do not get dizzy or light-headed.  If you are in pain, you may need to take or ask for pain medication before doing incentive spirometry. It is harder to take a deep breath  if you are having pain. AFTER USE  Rest and  breathe slowly and easily.  It can be helpful to keep track of a log of your progress. Your caregiver can provide you with a simple table to help with this. If you are using the spirometer at home, follow these instructions: SEEK MEDICAL CARE IF:   You are having difficultly using the spirometer.  You have trouble using the spirometer as often as instructed.  Your pain medication is not giving enough relief while using the spirometer.  You develop fever of 100.5 F (38.1 C) or higher. SEEK IMMEDIATE MEDICAL CARE IF:   You cough up bloody sputum that had not been present before.  You develop fever of 102 F (38.9 C) or greater.  You develop worsening pain at or near the incision site. MAKE SURE YOU:   Understand these instructions.  Will watch your condition.  Will get help right away if you are not doing well or get worse. Document Released: 04/03/2007 Document Revised: 02/13/2012 Document Reviewed: 06/04/2007 [facility] Patient Information 2014 [place], [place].   ________________________________________________________________________

## 2015-10-28 ENCOUNTER — Ambulatory Visit: Payer: Self-pay | Admitting: Orthopedic Surgery

## 2015-10-28 NOTE — H&P (Signed)
[name] DOB: 07-25-1961 Married / Language: English / Race: White Female  H&P Date: 10/28/15  Chief Complaint: L knee pain  History of Present Illness The patient is a 54 year old female who comes in today for a preoperative History and Physical. The patient is scheduled for a left total knee arthroplasty to be performed by Dr. Javier Docker, MD at [facility] on 11-05-2015. Bre reports progressively worsening L knee pain for years. She is s/p arthroscopy with grade 4 lesions in 2013. Reports grinding, severe pain, swelling, weakness, instability in the knee refractory to medications, injections, bracing, activity modifications. She does have underlying MS.  Dr. Shelle Iron and the patient mutually agreed to proceed with a Left total knee replacement. Risks and benefits of the procedure were discussed including stiffness, suboptimal range of motion, persistent pain, infection requiring removal of prosthesis and reinsertion, need for prophylactic antibiotics in the future, for example, dental procedures, possible need for manipulation, revision in the future and also anesthetic complications including DVT, PE, etc. We discussed the perioperative course, time in the hospital, postoperative recovery and the need for elevation to control swelling. We also discussed the predicted range of motion and the probability that squatting and kneeling would be unobtainable in the future. In addition, postoperative anticoagulation was discussed. We have obtained preoperative medical clearance as necessary. Provided her illustrated handout and discussed it in detail. They will enroll in the total joint replacement educational forum at the hospital.  She had her pre-op [facility] appt yesterday.  Problem List/Past Medical Hx Multiple Sclerosis Thoracic spine pain (M54.6) Spinal stenosis, lumbar (M48.06) Peripheral Neuropathy Autoimmune disorder Cervical disc disorder, unspecified, mid-cervical  region (M50.92) Degenerative cervical disc Cervical disc displacement (M50.20) Coccyx pain (M53.3) Irritable bowel syndrome Osteoarthritis Impaired Memory with MS Impaired Vision with MS Mitral Valve Disorder Urinary Tract Infection Urinary Incontinence Degenerative Disc Disease Streptococcus Infections Menopause  Allergies  SODIUM BASED MEDICATIONS11/04/2006 Swelling. sensativity  Family History Osteoarthritis mother Kidney disease mother Rheumatoid Arthritis grandmother mothers side and grandmother fathers side First Degree Relatives Cerebrovascular Accident mother and grandfather mothers side mother Severe allergy father Chronic Obstructive Lung Disease father Hypertension mother and grandfather fathers side mother Diabetes Mellitus mother  Social Hx Pain Contract no Illicit drug use no Exercise Exercises daily; does running / walking Exercises weekly; does other Living situation live with spouse, 2 level house with 5 steps to enter and 15 to bedroom Tobacco use never smoker Marital status married Children 3 Alcohol use never consumed alcohol Number of flights of stairs before winded greater than 5 Drug/Alcohol Rehab (Previously) no Drug/Alcohol Rehab (Currently) no 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 Lopressor (50MG  Tablet, 1/2 tab Oral prn) Active. Hydrocodone-Acetaminophen (7.5-325MG  Tablet, Oral) Active. Tecfidera (240MG  Capsule DR, Oral) Active. ALPRAZolam XR (0.5MG  Tablet ER 24HR, Oral) Active. Ritalin LA (20MG  Capsule ER 24HR, Oral) Active. Gabapentin (600MG  Tablet, Oral) Active. Medications Reconciled  Pregnancy / Birth History Pregnant no  Past Surgical History Tubal Ligation Hysterectomy Date: 2004. partial (non-cancerous) complete (cancerous) Straighten Nasal  Septum Tonsillectomy Arthroscopy of Knee left Cesarean Delivery x3: 1980, 1990, 1993  Review of Systems Lockie Pares M. Bissell PA-C; 10/28/2015 3:36 PM) General Not Present- Chills, Fatigue, Fever, Memory Loss, Night Sweats, Weight Gain and Weight Loss. Skin Not Present- Eczema, Hives, Itching, Lesions and Rash. HEENT Present- Blurred Vision and Double Vision. Not Present- Dentures, Headache, Hearing Loss, Tinnitus and Visual Loss.  Respiratory Not Present- Allergies, Chronic Cough, Coughing up blood, Shortness of breath at rest and Shortness of breath with exertion. Cardiovascular Not Present- Chest Pain, Difficulty Breathing Lying Down, Murmur, Palpitations, Racing/skipping heartbeats and Swelling. Gastrointestinal Present- Constipation. Not Present- Abdominal Pain, Bloody Stool, Diarrhea, Difficulty Swallowing, Heartburn, Jaundice, Loss of appetitie, Nausea and Vomiting. Female Genitourinary Present- Incontinence and Urinating at Night. Not Present- Blood in Urine, Discharge, Flank Pain, Painful Urination, Urgency, Urinary frequency, Urinary Retention and Weak urinary stream. Musculoskeletal Present- Back Pain, Joint Pain, Joint Swelling, Morning Stiffness, Muscle Pain, Muscle Weakness and Spasms. Neurological Present- Difficulty with balance and Weakness. Not Present- Blackout spells, Dizziness, Paralysis and Tremor. Psychiatric Not Present- Insomnia.  Vitals  10/28/2015 2:24 PM BP: 150/88 (Sitting, Left Arm, Standard)  Physical Exam General Mental Status -Alert, cooperative and good historian. General Appearance-pleasant, Not in acute distress. Orientation-Oriented X3. Build & Nutrition-Well nourished and Well developed.  Head and Neck Head-normocephalic, atraumatic . Neck Global Assessment - supple, no bruit auscultated on the right, no bruit auscultated on the left.  Eye Pupil - Bilateral-Regular and Round. Motion - Bilateral-EOMI.  Chest and Lung  Exam Auscultation Breath sounds - clear at anterior chest wall and clear at posterior chest wall. Adventitious sounds - No Adventitious sounds.  Cardiovascular Auscultation Rhythm - Regular rate and rhythm. Heart Sounds - S1 WNL and S2 WNL. Murmurs & Other Heart Sounds - Auscultation of the heart reveals - No Murmurs.  Abdomen Palpation/Percussion Tenderness - Abdomen is non-tender to palpation. Rigidity (guarding) - Abdomen is soft. Auscultation Auscultation of the abdomen reveals - Bowel sounds normal.  Female Genitourinary Note: Not done, not pertinent to present illness  Musculoskeletal Note: Moderate distress, walks with an antalgic gait. Mood and affect is slightly anxious. Knee with slight valgus deformity, tender medial joint line, patellofemoral pain on compression, trace effusion, tender laterally. Her range is -5 to 90. Negative anterior drawer, ipsilateral hip and ankle exam is unremarkable. She had a history of foot drop on the right, although has dorsiflexion of 5-/5.  Imaging x-rays AP and lateral demonstrate tricompartmental osteoarthrosis with slight valgus deformity.  Assessment & Plan Primary osteoarthritis of left knee (M17.12)  Pt with end-stage knee L DJD, grade 4 wear on arthroscopy 3 years ago, refractory to conservative tx, scheduled for LEft total knee replacement by Dr. Shelle Iron on 11/05/15. We again discussed the procedure itself as well as risks, complications and alternatives, including but not limited to DVT, PE, infx, bleeding, failure of procedure, need for secondary procedure including manipulation, nerve injury, ongoing pain/symptoms, anesthesia risk, even stroke or death. Also discussed typical post-op protocols, activity restrictions, need for PT, flexion/extension exercises, time out of work. Discussed need for DVT ppx post-op with Xarelto then ASA per protocol. Discussed dental ppx. Also discussed limitations post-operatively such as kneeling and  squatting. All questions were answered. Patient desires to proceed with surgery as scheduled. Discussed management of MS peri-operatively. Will hold ASA and NSAIDs accordingly. Will remain NPO after MN night before surgery. Will present to [facility] for pre-op testing. Plan Xarelto 2 weeks post-op for DVT ppx then ASA. Plan pain medication, Robaxin, Colace. Plan D/C to [facility] initially post-op due to weakness, foot drop, and MS, then D/C to home with family members at home for assistance and outpt PT. Will follow up 10-14 days post-op for suture removal and xrays.  Plan left total knee arthroplasty  Signed electronically by Dorothy Spark, PA-C  For Dr. Shelle Iron

## 2015-11-05 ENCOUNTER — Inpatient Hospital Stay (HOSPITAL_COMMUNITY): Payer: Medicare Other | Admitting: Anesthesiology

## 2015-11-05 ENCOUNTER — Encounter (HOSPITAL_COMMUNITY): Payer: Self-pay | Admitting: Anesthesiology

## 2015-11-05 ENCOUNTER — Encounter (HOSPITAL_COMMUNITY)
Admission: RE | Disposition: A | Discharge status: Skilled Nursing Facility | Payer: Self-pay | Source: Ambulatory Visit | Attending: Specialist

## 2015-11-05 ENCOUNTER — Inpatient Hospital Stay (HOSPITAL_COMMUNITY): Payer: Medicare Other

## 2015-11-05 ENCOUNTER — Inpatient Hospital Stay (HOSPITAL_COMMUNITY)
Admission: RE | Admit: 2015-11-05 | Discharge: 2015-11-07 | DRG: 470 | Disposition: A | Discharge status: Skilled Nursing Facility | Payer: Medicare Other | Source: Ambulatory Visit | Attending: Specialist | Admitting: Specialist

## 2015-11-05 DIAGNOSIS — M21379 Foot drop, unspecified foot: Secondary | ICD-10-CM | POA: Diagnosis present

## 2015-11-05 DIAGNOSIS — M4806 Spinal stenosis, lumbar region: Secondary | ICD-10-CM | POA: Diagnosis present

## 2015-11-05 DIAGNOSIS — G629 Polyneuropathy, unspecified: Secondary | ICD-10-CM | POA: Diagnosis present

## 2015-11-05 DIAGNOSIS — Z8261 Family history of arthritis: Secondary | ICD-10-CM

## 2015-11-05 DIAGNOSIS — M25562 Pain in left knee: Secondary | ICD-10-CM | POA: Diagnosis present

## 2015-11-05 DIAGNOSIS — Z79899 Other long term (current) drug therapy: Secondary | ICD-10-CM | POA: Diagnosis not present

## 2015-11-05 DIAGNOSIS — M1712 Unilateral primary osteoarthritis, left knee: Principal | ICD-10-CM | POA: Diagnosis present

## 2015-11-05 DIAGNOSIS — G35 Multiple sclerosis: Secondary | ICD-10-CM | POA: Diagnosis present

## 2015-11-05 DIAGNOSIS — R509 Fever, unspecified: Secondary | ICD-10-CM | POA: Diagnosis not present

## 2015-11-05 DIAGNOSIS — Z96659 Presence of unspecified artificial knee joint: Secondary | ICD-10-CM

## 2015-11-05 HISTORY — PX: TOTAL KNEE ARTHROPLASTY: SHX125

## 2015-11-05 LAB — APTT: aPTT: 40 seconds — ABNORMAL HIGH (ref 24–37)

## 2015-11-05 LAB — TYPE AND SCREEN
ABO/RH(D): A POS
Antibody Screen: NEGATIVE

## 2015-11-05 SURGERY — ARTHROPLASTY, KNEE, TOTAL
Anesthesia: General | Site: Knee | Laterality: Left

## 2015-11-05 MED ORDER — FENTANYL CITRATE (PF) 100 MCG/2ML IJ SOLN
INTRAMUSCULAR | Status: DC | PRN
  Administered 2015-11-05 (×2): 50 ug via INTRAVENOUS
  Administered 2015-11-05: 100 ug via INTRAVENOUS

## 2015-11-05 MED ORDER — ALPRAZOLAM 0.5 MG PO TABS
0.5000 mg | ORAL_TABLET | Freq: Three times a day (TID) | ORAL | Status: DC | PRN
  Administered 2015-11-05 – 2015-11-06 (×2): 0.5 mg via ORAL
  Filled 2015-11-05 (×2): qty 1

## 2015-11-05 MED ORDER — RISAQUAD PO CAPS
1.0000 | ORAL_CAPSULE | Freq: Every day | ORAL | Status: DC
  Administered 2015-11-05 – 2015-11-07 (×3): 1 via ORAL
  Filled 2015-11-05 (×3): qty 1

## 2015-11-05 MED ORDER — METOCLOPRAMIDE HCL 5 MG/ML IJ SOLN: 5.0000 mg | Freq: Three times a day (TID) | INTRAMUSCULAR | Status: DC | PRN

## 2015-11-05 MED ORDER — DEXAMETHASONE SODIUM PHOSPHATE 10 MG/ML IJ SOLN
INTRAMUSCULAR | Status: AC
  Filled 2015-11-05: qty 1

## 2015-11-05 MED ORDER — METHOCARBAMOL 500 MG PO TABS
500.0000 mg | ORAL_TABLET | Freq: Four times a day (QID) | ORAL | Status: DC | PRN
  Administered 2015-11-05 – 2015-11-07 (×3): 500 mg via ORAL
  Filled 2015-11-05 (×4): qty 1

## 2015-11-05 MED ORDER — OXYCODONE-ACETAMINOPHEN 10-325 MG PO TABS: 1.0000 | ORAL_TABLET | ORAL | Status: DC | PRN

## 2015-11-05 MED ORDER — MAGNESIUM CITRATE PO SOLN: 1.0000 | Freq: Once | ORAL | Status: DC | PRN

## 2015-11-05 MED ORDER — SODIUM CHLORIDE 0.9 % IR SOLN
Status: DC | PRN
  Administered 2015-11-05: 300 mL

## 2015-11-05 MED ORDER — METHYLPHENIDATE HCL 5 MG PO TABS
20.0000 mg | ORAL_TABLET | Freq: Every day | ORAL | Status: DC
  Administered 2015-11-06 – 2015-11-07 (×2): 20 mg via ORAL
  Filled 2015-11-05 (×2): qty 4

## 2015-11-05 MED ORDER — PROPOFOL 10 MG/ML IV BOLUS
INTRAVENOUS | Status: DC | PRN
  Administered 2015-11-05: 150 mg via INTRAVENOUS
  Administered 2015-11-05: 50 mg via INTRAVENOUS

## 2015-11-05 MED ORDER — HYDROMORPHONE HCL 2 MG/ML IJ SOLN
INTRAMUSCULAR | Status: AC
  Filled 2015-11-05: qty 1

## 2015-11-05 MED ORDER — ONDANSETRON HCL 4 MG/2ML IJ SOLN: 4.0000 mg | Freq: Four times a day (QID) | INTRAMUSCULAR | Status: DC | PRN

## 2015-11-05 MED ORDER — BUPIVACAINE-EPINEPHRINE 0.25% -1:200000 IJ SOLN
INTRAMUSCULAR | Status: DC | PRN
  Administered 2015-11-05: 50 mL

## 2015-11-05 MED ORDER — HYDROMORPHONE HCL 1 MG/ML IJ SOLN
1.0000 mg | INTRAMUSCULAR | Status: DC | PRN
  Administered 2015-11-05 – 2015-11-07 (×8): 1 mg via INTRAVENOUS
  Filled 2015-11-05 (×9): qty 1

## 2015-11-05 MED ORDER — BUPIVACAINE-EPINEPHRINE (PF) 0.25% -1:200000 IJ SOLN
INTRAMUSCULAR | Status: AC
  Filled 2015-11-05: qty 30

## 2015-11-05 MED ORDER — METHYLPHENIDATE HCL 10 MG PO TABS: 20.0000 mg | ORAL_TABLET | Freq: Two times a day (BID) | ORAL | Status: DC

## 2015-11-05 MED ORDER — METOCLOPRAMIDE HCL 10 MG PO TABS: 5.0000 mg | ORAL_TABLET | Freq: Three times a day (TID) | ORAL | Status: DC | PRN

## 2015-11-05 MED ORDER — CEFAZOLIN SODIUM-DEXTROSE 2-3 GM-% IV SOLR
2.0000 g | Freq: Four times a day (QID) | INTRAVENOUS | Status: AC
  Administered 2015-11-05 – 2015-11-06 (×3): 2 g via INTRAVENOUS
  Filled 2015-11-05 (×3): qty 50

## 2015-11-05 MED ORDER — PHENOL 1.4 % MT LIQD: 1.0000 | OROMUCOSAL | Status: DC | PRN

## 2015-11-05 MED ORDER — OXYCODONE HCL 5 MG PO TABS
5.0000 mg | ORAL_TABLET | ORAL | Status: DC | PRN
  Administered 2015-11-05 (×2): 10 mg via ORAL
  Administered 2015-11-05: 5 mg via ORAL
  Administered 2015-11-05: 10 mg via ORAL
  Administered 2015-11-06: 5 mg via ORAL
  Administered 2015-11-06 – 2015-11-07 (×9): 10 mg via ORAL
  Filled 2015-11-05 (×4): qty 2
  Filled 2015-11-05: qty 1
  Filled 2015-11-05 (×9): qty 2

## 2015-11-05 MED ORDER — HYDROMORPHONE HCL 1 MG/ML IJ SOLN
INTRAMUSCULAR | Status: AC
  Filled 2015-11-05: qty 2

## 2015-11-05 MED ORDER — CEFAZOLIN SODIUM-DEXTROSE 2-3 GM-% IV SOLR
2.0000 g | INTRAVENOUS | Status: AC
  Administered 2015-11-05: 2 g via INTRAVENOUS

## 2015-11-05 MED ORDER — KCL IN DEXTROSE-NACL 20-5-0.45 MEQ/L-%-% IV SOLN
INTRAVENOUS | Status: AC
  Administered 2015-11-05: 50 mL/h via INTRAVENOUS
  Filled 2015-11-05 (×3): qty 1000

## 2015-11-05 MED ORDER — PROPOFOL 10 MG/ML IV BOLUS
INTRAVENOUS | Status: AC
  Filled 2015-11-05: qty 20

## 2015-11-05 MED ORDER — GABAPENTIN 600 MG PO TABS
600.0000 mg | ORAL_TABLET | Freq: Four times a day (QID) | ORAL | Status: DC
  Filled 2015-11-05 (×3): qty 1

## 2015-11-05 MED ORDER — PROMETHAZINE HCL 25 MG/ML IJ SOLN
6.2500 mg | INTRAMUSCULAR | Status: DC | PRN
  Administered 2015-11-05: 12.5 mg via INTRAVENOUS

## 2015-11-05 MED ORDER — ONDANSETRON HCL 4 MG/2ML IJ SOLN
INTRAMUSCULAR | Status: DC | PRN
Start: 2015-11-05 — End: 2015-11-05
  Administered 2015-11-05: 4 mg via INTRAVENOUS

## 2015-11-05 MED ORDER — LIDOCAINE HCL (PF) 2 % IJ SOLN
INTRAMUSCULAR | Status: DC | PRN
  Administered 2015-11-05: 40 mg via INTRADERMAL

## 2015-11-05 MED ORDER — RIVAROXABAN 10 MG PO TABS
10.0000 mg | ORAL_TABLET | Freq: Every day | ORAL | Status: DC
  Administered 2015-11-06 – 2015-11-07 (×2): 10 mg via ORAL
  Filled 2015-11-05 (×3): qty 1

## 2015-11-05 MED ORDER — MIDAZOLAM HCL 2 MG/2ML IJ SOLN
INTRAMUSCULAR | Status: AC
  Filled 2015-11-05: qty 2

## 2015-11-05 MED ORDER — RIVAROXABAN 10 MG PO TABS: 10.0000 mg | ORAL_TABLET | Freq: Every day | ORAL | Status: DC

## 2015-11-05 MED ORDER — METHOCARBAMOL 1000 MG/10ML IJ SOLN
500.0000 mg | Freq: Four times a day (QID) | INTRAVENOUS | Status: DC | PRN
  Administered 2015-11-05 – 2015-11-07 (×4): 500 mg via INTRAVENOUS
  Filled 2015-11-05 (×9): qty 5

## 2015-11-05 MED ORDER — MIDAZOLAM HCL 5 MG/5ML IJ SOLN
INTRAMUSCULAR | Status: DC | PRN
  Administered 2015-11-05: 2 mg via INTRAVENOUS

## 2015-11-05 MED ORDER — FENTANYL CITRATE (PF) 100 MCG/2ML IJ SOLN
INTRAMUSCULAR | Status: AC
  Filled 2015-11-05: qty 2

## 2015-11-05 MED ORDER — MENTHOL 3 MG MT LOZG: 1.0000 | LOZENGE | OROMUCOSAL | Status: DC | PRN

## 2015-11-05 MED ORDER — METHYLPHENIDATE HCL 10 MG PO TABS: 40.0000 mg | ORAL_TABLET | Freq: Every day | ORAL | Status: DC

## 2015-11-05 MED ORDER — DOCUSATE SODIUM 100 MG PO CAPS
100.0000 mg | ORAL_CAPSULE | Freq: Two times a day (BID) | ORAL | Status: DC
  Administered 2015-11-05 – 2015-11-06 (×3): 100 mg via ORAL

## 2015-11-05 MED ORDER — ACETAMINOPHEN 650 MG RE SUPP: 650.0000 mg | Freq: Four times a day (QID) | RECTAL | Status: DC | PRN

## 2015-11-05 MED ORDER — ONDANSETRON HCL 4 MG/2ML IJ SOLN
INTRAMUSCULAR | Status: AC
  Filled 2015-11-05: qty 2

## 2015-11-05 MED ORDER — LIDOCAINE HCL (CARDIAC) 20 MG/ML IV SOLN
INTRAVENOUS | Status: AC
  Filled 2015-11-05: qty 5

## 2015-11-05 MED ORDER — METOPROLOL TARTRATE 25 MG PO TABS
25.0000 mg | ORAL_TABLET | Freq: Two times a day (BID) | ORAL | Status: DC | PRN
  Filled 2015-11-05: qty 1

## 2015-11-05 MED ORDER — ONDANSETRON HCL 4 MG PO TABS: 4.0000 mg | ORAL_TABLET | Freq: Four times a day (QID) | ORAL | Status: DC | PRN

## 2015-11-05 MED ORDER — SODIUM CHLORIDE 0.9 % IR SOLN
Status: DC | PRN
  Administered 2015-11-05: 2000 mL

## 2015-11-05 MED ORDER — PROMETHAZINE HCL 25 MG/ML IJ SOLN
INTRAMUSCULAR | Status: AC
  Filled 2015-11-05: qty 1

## 2015-11-05 MED ORDER — ACETAMINOPHEN 325 MG PO TABS
650.0000 mg | ORAL_TABLET | Freq: Four times a day (QID) | ORAL | Status: DC | PRN
  Administered 2015-11-06 – 2015-11-07 (×3): 650 mg via ORAL
  Filled 2015-11-05 (×3): qty 2

## 2015-11-05 MED ORDER — HYDROMORPHONE HCL 1 MG/ML IJ SOLN
0.2500 mg | INTRAMUSCULAR | Status: DC | PRN
  Administered 2015-11-05 (×4): 0.5 mg via INTRAVENOUS

## 2015-11-05 MED ORDER — METHYLPHENIDATE HCL 5 MG PO TABS
40.0000 mg | ORAL_TABLET | Freq: Every day | ORAL | Status: DC
  Administered 2015-11-06 – 2015-11-07 (×2): 40 mg via ORAL
  Filled 2015-11-05: qty 4
  Filled 2015-11-05: qty 8

## 2015-11-05 MED ORDER — DOCUSATE SODIUM 100 MG PO CAPS: 100.0000 mg | ORAL_CAPSULE | Freq: Two times a day (BID) | ORAL | Status: DC | PRN

## 2015-11-05 MED ORDER — SODIUM CHLORIDE 0.9 % IR SOLN
Status: AC
  Filled 2015-11-05: qty 1

## 2015-11-05 MED ORDER — DEXAMETHASONE SODIUM PHOSPHATE 10 MG/ML IJ SOLN
INTRAMUSCULAR | Status: DC | PRN
  Administered 2015-11-05: 10 mg via INTRAVENOUS

## 2015-11-05 MED ORDER — LACTATED RINGERS IV SOLN
INTRAVENOUS | Status: DC
  Administered 2015-11-05 (×3): via INTRAVENOUS

## 2015-11-05 MED ORDER — DIMETHYL FUMARATE 240 MG PO CPDR
240.0000 mg | DELAYED_RELEASE_CAPSULE | Freq: Two times a day (BID) | ORAL | Status: DC
  Administered 2015-11-05 – 2015-11-07 (×4): 240 mg via ORAL
  Filled 2015-11-05: qty 1

## 2015-11-05 MED ORDER — HYDROMORPHONE HCL 1 MG/ML IJ SOLN
INTRAMUSCULAR | Status: DC | PRN
  Administered 2015-11-05 (×2): 1 mg via INTRAVENOUS

## 2015-11-05 MED ORDER — ALUM & MAG HYDROXIDE-SIMETH 200-200-20 MG/5ML PO SUSP: 30.0000 mL | ORAL | Status: DC | PRN

## 2015-11-05 MED ORDER — BISACODYL 5 MG PO TBEC: 5.0000 mg | DELAYED_RELEASE_TABLET | Freq: Every day | ORAL | Status: DC | PRN

## 2015-11-05 MED ORDER — SENNOSIDES-DOCUSATE SODIUM 8.6-50 MG PO TABS: 1.0000 | ORAL_TABLET | Freq: Every evening | ORAL | Status: DC | PRN

## 2015-11-05 MED ORDER — HYOSCYAMINE SULFATE 0.125 MG SL SUBL
0.1250 mg | SUBLINGUAL_TABLET | SUBLINGUAL | Status: DC | PRN
Start: 2015-11-05 — End: 2015-11-07
  Filled 2015-11-05: qty 1

## 2015-11-05 MED ORDER — CEFAZOLIN SODIUM-DEXTROSE 2-3 GM-% IV SOLR
INTRAVENOUS | Status: AC
  Filled 2015-11-05: qty 50

## 2015-11-05 MED ORDER — DIPHENHYDRAMINE HCL 12.5 MG/5ML PO ELIX: 12.5000 mg | ORAL_SOLUTION | ORAL | Status: DC | PRN

## 2015-11-05 MED ORDER — SUCCINYLCHOLINE CHLORIDE 20 MG/ML IJ SOLN
INTRAMUSCULAR | Status: DC | PRN
  Administered 2015-11-05: 100 mg via INTRAVENOUS

## 2015-11-05 MED ORDER — GABAPENTIN 300 MG PO CAPS
600.0000 mg | ORAL_CAPSULE | Freq: Four times a day (QID) | ORAL | Status: DC
  Administered 2015-11-05 – 2015-11-07 (×8): 600 mg via ORAL
  Filled 2015-11-05 (×11): qty 2

## 2015-11-05 SURGICAL SUPPLY — 59 items
BAG ZIPLOCK 12X15 (MISCELLANEOUS) IMPLANT
BANDAGE ELASTIC 4 VELCRO ST LF (GAUZE/BANDAGES/DRESSINGS) ×3 IMPLANT
BANDAGE ELASTIC 6 VELCRO ST LF (GAUZE/BANDAGES/DRESSINGS) ×3 IMPLANT
BLADE SAG 18X100X1.27 (BLADE) ×3 IMPLANT
BLADE SAW SGTL 13.0X1.19X90.0M (BLADE) ×3 IMPLANT
CAP KNEE TOTAL 3 SIGMA ×3 IMPLANT
CEMENT HV SMART SET (Cement) ×6 IMPLANT
CLOSURE WOUND 1/2 X4 (GAUZE/BANDAGES/DRESSINGS) ×2
CLOTH 2% CHLOROHEXIDINE 3PK (PERSONAL CARE ITEMS) ×3 IMPLANT
CUFF TOURN SGL QUICK 34 (TOURNIQUET CUFF) ×2
CUFF TRNQT CYL 34X4X40X1 (TOURNIQUET CUFF) ×1 IMPLANT
DECANTER SPIKE VIAL GLASS SM (MISCELLANEOUS) ×3 IMPLANT
DRAPE INCISE IOBAN 66X45 STRL (DRAPES) IMPLANT
DRAPE LG THREE QUARTER DISP (DRAPES) ×12 IMPLANT
DRAPE ORTHO SPLIT 77X108 STRL (DRAPES) ×4
DRAPE SURG ORHT 6 SPLT 77X108 (DRAPES) ×2 IMPLANT
DRAPE U-SHAPE 47X51 STRL (DRAPES) ×3 IMPLANT
DRSG AQUACEL AG ADV 3.5X10 (GAUZE/BANDAGES/DRESSINGS) ×3 IMPLANT
DRSG TEGADERM 4X4.75 (GAUZE/BANDAGES/DRESSINGS) IMPLANT
DURAPREP 26ML APPLICATOR (WOUND CARE) ×3 IMPLANT
ELECT REM PT RETURN 9FT ADLT (ELECTROSURGICAL) ×3
ELECTRODE REM PT RTRN 9FT ADLT (ELECTROSURGICAL) ×1 IMPLANT
EVACUATOR 1/8 PVC DRAIN (DRAIN) IMPLANT
GAUZE SPONGE 2X2 8PLY STRL LF (GAUZE/BANDAGES/DRESSINGS) IMPLANT
GLOVE BIO SURGEON STRL SZ7.5 (GLOVE) ×6 IMPLANT
GLOVE BIOGEL PI IND STRL 7.0 (GLOVE) ×1 IMPLANT
GLOVE BIOGEL PI IND STRL 8 (GLOVE) ×2 IMPLANT
GLOVE BIOGEL PI INDICATOR 7.0 (GLOVE) ×2
GLOVE BIOGEL PI INDICATOR 8 (GLOVE) ×4
GLOVE SURG SS PI 7.0 STRL IVOR (GLOVE) ×3 IMPLANT
GLOVE SURG SS PI 7.5 STRL IVOR (GLOVE) ×3 IMPLANT
GLOVE SURG SS PI 8.0 STRL IVOR (GLOVE) ×6 IMPLANT
GOWN STRL REUS W/TWL XL LVL3 (GOWN DISPOSABLE) ×6 IMPLANT
HANDPIECE INTERPULSE COAX TIP (DISPOSABLE) ×2
IMMOBILIZER KNEE 20 (SOFTGOODS) ×3
IMMOBILIZER KNEE 20 THIGH 36 (SOFTGOODS) ×1 IMPLANT
MANIFOLD NEPTUNE II (INSTRUMENTS) ×3 IMPLANT
NS IRRIG 1000ML POUR BTL (IV SOLUTION) IMPLANT
PACK TOTAL KNEE CUSTOM (KITS) ×3 IMPLANT
POSITIONER SURGICAL ARM (MISCELLANEOUS) ×3 IMPLANT
SET HNDPC FAN SPRY TIP SCT (DISPOSABLE) ×1 IMPLANT
SPONGE GAUZE 2X2 STER 10/PKG (GAUZE/BANDAGES/DRESSINGS)
SPONGE SURGIFOAM ABS GEL 100 (HEMOSTASIS) IMPLANT
STAPLER VISISTAT (STAPLE) IMPLANT
STRIP CLOSURE SKIN 1/2X4 (GAUZE/BANDAGES/DRESSINGS) ×4 IMPLANT
SUCTION FRAZIER 12FR DISP (SUCTIONS) ×3 IMPLANT
SUT BONE WAX W31G (SUTURE) ×3 IMPLANT
SUT MNCRL AB 4-0 PS2 18 (SUTURE) IMPLANT
SUT VIC AB 1 CT1 27 (SUTURE) ×2
SUT VIC AB 1 CT1 27XBRD ANTBC (SUTURE) ×1 IMPLANT
SUT VIC AB 2-0 CT1 27 (SUTURE) ×6
SUT VIC AB 2-0 CT1 TAPERPNT 27 (SUTURE) ×3 IMPLANT
SUT VLOC 180 0 24IN GS25 (SUTURE) ×3 IMPLANT
SYR 50ML LL SCALE MARK (SYRINGE) ×3 IMPLANT
TOWER CARTRIDGE SMART MIX (DISPOSABLE) ×3 IMPLANT
TRAY FOLEY W/METER SILVER 14FR (SET/KITS/TRAYS/PACK) ×3 IMPLANT
WATER STERILE IRR 1500ML POUR (IV SOLUTION) ×3 IMPLANT
WRAP KNEE MAXI GEL POST OP (GAUZE/BANDAGES/DRESSINGS) ×3 IMPLANT
YANKAUER SUCT BULB TIP 10FT TU (MISCELLANEOUS) ×3 IMPLANT

## 2015-11-05 NOTE — Interval H&P Note (Signed)
History and Physical Interval Note:  11/05/2015 7:21 AM  [name]  has presented today for surgery, with the diagnosis of DJD LEFT KNEE  The various methods of treatment have been discussed with the patient and family. After consideration of risks, benefits and other options for treatment, the patient has consented to  Procedure(s): LEFT TOTAL KNEE ARTHROPLASTY (Left) as a surgical intervention .  The patient's history has been reviewed, patient examined, no change in status, stable for surgery.  I have reviewed the patient's chart and labs.  Questions were answered to the patient's satisfaction.     Mischele Detter C

## 2015-11-05 NOTE — Progress Notes (Signed)
Feels like " nerve is pinched and left arm is burning" last few days

## 2015-11-05 NOTE — Anesthesia Postprocedure Evaluation (Signed)
Anesthesia Post Note  Patient: [name]  Procedure(s) Performed: Procedure(s) (LRB): LEFT TOTAL KNEE ARTHROPLASTY (Left)  Patient location during evaluation: PACU Anesthesia Type: General Level of consciousness: awake and alert Pain management: pain level controlled Vital Signs Assessment: post-procedure vital signs reviewed and stable Respiratory status: spontaneous breathing, nonlabored ventilation, respiratory function stable and patient connected to nasal cannula oxygen Cardiovascular status: blood pressure returned to baseline and stable Postop Assessment: no signs of nausea or vomiting Anesthetic complications: no    Last Vitals:  Filed Vitals:   11/05/15 1345 11/05/15 1513  BP: 132/65 140/65  Pulse: 64 64  Temp: 37.2 C 37.2 C  Resp: 16 18    Last Pain:  Filed Vitals:   11/05/15 1527  PainSc: 5     LLE Motor Response: Purposeful movement LLE Sensation: No numbness, No tingling          Lamir Racca J

## 2015-11-05 NOTE — Transfer of Care (Signed)
Immediate Anesthesia Transfer of Care Note  Patient: [name]  Procedure(s) Performed: Procedure(s): LEFT TOTAL KNEE ARTHROPLASTY (Left)  Patient Location: PACU  Anesthesia Type:General  Level of Consciousness:  sedated, patient cooperative and responds to stimulation  Airway & Oxygen Therapy:Patient Spontanous Breathing and Patient connected to face mask oxgen  Post-op Assessment:  Report given to PACU RN and Post -op Vital signs reviewed and stable  Post vital signs:  Reviewed and stable  Last Vitals:  Filed Vitals:   11/05/15 0540  BP: 135/78  Pulse: 76  Temp: 36.6 C  Resp: 18    Complications: No apparent anesthesia complications

## 2015-11-05 NOTE — Brief Op Note (Signed)
11/05/2015  9:47 AM  PATIENT:  [name]  54 y.o. female  PRE-OPERATIVE DIAGNOSIS:  DJD LEFT KNEE  POST-OPERATIVE DIAGNOSIS:  * No post-op diagnosis entered *  PROCEDURE:  Procedure(s): LEFT TOTAL KNEE ARTHROPLASTY (Left)  SURGEON:  Surgeon(s) and Role:    * Jene Every, MD - Primary  PHYSICIAN ASSISTANT:   ASSISTANTS: Bissell   ANESTHESIA:   general  EBL:  Total I/O In: 1000 [I.V.:1000] Out: 150 [Urine:150]  BLOOD ADMINISTERED:none  DRAINS: none   LOCAL MEDICATIONS USED:  MARCAINE     SPECIMEN:  No Specimen  DISPOSITION OF SPECIMEN:  N/A  COUNTS:  YES  TOURNIQUET:   Total Tourniquet Time Documented: Thigh (Left) - 82 minutes Total: Thigh (Left) - 82 minutes   DICTATION: .Other Dictation: Dictation Number [id number]  PLAN OF CARE: Admit to inpatient   PATIENT DISPOSITION:  PACU - hemodynamically stable.   Delay start of Pharmacological VTE agent (>24hrs) due to surgical blood loss or risk of bleeding: no

## 2015-11-05 NOTE — Anesthesia Preprocedure Evaluation (Addendum)
Anesthesia Evaluation  Patient identified by MRN, date of birth, ID band Patient awake    Reviewed: Allergy & Precautions, NPO status , Patient's Chart, lab work & pertinent test results  Airway Mallampati: II  TM Distance: >3 FB Neck ROM: Full    Dental no notable dental hx.    Pulmonary neg pulmonary ROS,    Pulmonary exam normal breath sounds clear to auscultation       Cardiovascular Exercise Tolerance: Good Normal cardiovascular exam+ dysrhythmias  Rhythm:Regular Rate:Normal     Neuro/Psych  Headaches, PSYCHIATRIC DISORDERS Anxiety Multiple Sclerosis. H/O weakness right leg. Pain in both legs. Burning pain in left arm. Pain in upper middle back. Jaw pain.  Neuromuscular disease    GI/Hepatic negative GI ROS, Neg liver ROS,   Endo/Other  negative endocrine ROS  Renal/GU negative Renal ROS  negative genitourinary   Musculoskeletal  (+) Arthritis ,   Abdominal   Peds negative pediatric ROS (+)  Hematology negative hematology ROS (+)   Anesthesia Other Findings   Reproductive/Obstetrics negative OB ROS                            Anesthesia Physical Anesthesia Plan  ASA: II  Anesthesia Plan: General   Post-op Pain Management:    Induction: Intravenous  Airway Management Planned: Oral ETT  Additional Equipment:   Intra-op Plan:   Post-operative Plan: Extubation in OR  Informed Consent: I have reviewed the patients History and Physical, chart, labs and discussed the procedure including the risks, benefits and alternatives for the proposed anesthesia with the patient or authorized representative who has indicated his/her understanding and acceptance.   Dental advisory given  Plan Discussed with: CRNA  Anesthesia Plan Comments: (Discussed r/b of general, spinal and epidural anesthesia. Recommend general anesthesia with h/o significant multiple sclerosis. Patient consents to  general. Still a risk of exacerbation of multiple sclerosis and she understands.)       Anesthesia Quick Evaluation

## 2015-11-05 NOTE — H&P (View-Only) (Signed)
[name] DOB: 07-25-1961 Married / Language: English / Race: White Female  H&P Date: 10/28/15  Chief Complaint: L knee pain  History of Present Illness The patient is a 54 year old female who comes in today for a preoperative History and Physical. The patient is scheduled for a left total knee arthroplasty to be performed by Dr. Javier Docker, MD at [facility] on 11-05-2015. Bre reports progressively worsening L knee pain for years. She is s/p arthroscopy with grade 4 lesions in 2013. Reports grinding, severe pain, swelling, weakness, instability in the knee refractory to medications, injections, bracing, activity modifications. She does have underlying MS.  Dr. Shelle Iron and the patient mutually agreed to proceed with a Left total knee replacement. Risks and benefits of the procedure were discussed including stiffness, suboptimal range of motion, persistent pain, infection requiring removal of prosthesis and reinsertion, need for prophylactic antibiotics in the future, for example, dental procedures, possible need for manipulation, revision in the future and also anesthetic complications including DVT, PE, etc. We discussed the perioperative course, time in the hospital, postoperative recovery and the need for elevation to control swelling. We also discussed the predicted range of motion and the probability that squatting and kneeling would be unobtainable in the future. In addition, postoperative anticoagulation was discussed. We have obtained preoperative medical clearance as necessary. Provided her illustrated handout and discussed it in detail. They will enroll in the total joint replacement educational forum at the hospital.  She had her pre-op [facility] appt yesterday.  Problem List/Past Medical Hx Multiple Sclerosis Thoracic spine pain (M54.6) Spinal stenosis, lumbar (M48.06) Peripheral Neuropathy Autoimmune disorder Cervical disc disorder, unspecified, mid-cervical  region (M50.92) Degenerative cervical disc Cervical disc displacement (M50.20) Coccyx pain (M53.3) Irritable bowel syndrome Osteoarthritis Impaired Memory with MS Impaired Vision with MS Mitral Valve Disorder Urinary Tract Infection Urinary Incontinence Degenerative Disc Disease Streptococcus Infections Menopause  Allergies  SODIUM BASED MEDICATIONS11/04/2006 Swelling. sensativity  Family History Osteoarthritis mother Kidney disease mother Rheumatoid Arthritis grandmother mothers side and grandmother fathers side First Degree Relatives Cerebrovascular Accident mother and grandfather mothers side mother Severe allergy father Chronic Obstructive Lung Disease father Hypertension mother and grandfather fathers side mother Diabetes Mellitus mother  Social Hx Pain Contract no Illicit drug use no Exercise Exercises daily; does running / walking Exercises weekly; does other Living situation live with spouse, 2 level house with 5 steps to enter and 15 to bedroom Tobacco use never smoker Marital status married Children 3 Alcohol use never consumed alcohol Number of flights of stairs before winded greater than 5 Drug/Alcohol Rehab (Previously) no Drug/Alcohol Rehab (Currently) no 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 Lopressor (50MG  Tablet, 1/2 tab Oral prn) Active. Hydrocodone-Acetaminophen (7.5-325MG  Tablet, Oral) Active. Tecfidera (240MG  Capsule DR, Oral) Active. ALPRAZolam XR (0.5MG  Tablet ER 24HR, Oral) Active. Ritalin LA (20MG  Capsule ER 24HR, Oral) Active. Gabapentin (600MG  Tablet, Oral) Active. Medications Reconciled  Pregnancy / Birth History Pregnant no  Past Surgical History Tubal Ligation Hysterectomy Date: 2004. partial (non-cancerous) complete (cancerous) Straighten Nasal  Septum Tonsillectomy Arthroscopy of Knee left Cesarean Delivery x3: 1980, 1990, 1993  Review of Systems Lockie Pares M. Bissell PA-C; 10/28/2015 3:36 PM) General Not Present- Chills, Fatigue, Fever, Memory Loss, Night Sweats, Weight Gain and Weight Loss. Skin Not Present- Eczema, Hives, Itching, Lesions and Rash. HEENT Present- Blurred Vision and Double Vision. Not Present- Dentures, Headache, Hearing Loss, Tinnitus and Visual Loss.  Respiratory Not Present- Allergies, Chronic Cough, Coughing up blood, Shortness of breath at rest and Shortness of breath with exertion. Cardiovascular Not Present- Chest Pain, Difficulty Breathing Lying Down, Murmur, Palpitations, Racing/skipping heartbeats and Swelling. Gastrointestinal Present- Constipation. Not Present- Abdominal Pain, Bloody Stool, Diarrhea, Difficulty Swallowing, Heartburn, Jaundice, Loss of appetitie, Nausea and Vomiting. Female Genitourinary Present- Incontinence and Urinating at Night. Not Present- Blood in Urine, Discharge, Flank Pain, Painful Urination, Urgency, Urinary frequency, Urinary Retention and Weak urinary stream. Musculoskeletal Present- Back Pain, Joint Pain, Joint Swelling, Morning Stiffness, Muscle Pain, Muscle Weakness and Spasms. Neurological Present- Difficulty with balance and Weakness. Not Present- Blackout spells, Dizziness, Paralysis and Tremor. Psychiatric Not Present- Insomnia.  Vitals  10/28/2015 2:24 PM BP: 150/88 (Sitting, Left Arm, Standard)  Physical Exam General Mental Status -Alert, cooperative and good historian. General Appearance-pleasant, Not in acute distress. Orientation-Oriented X3. Build & Nutrition-Well nourished and Well developed.  Head and Neck Head-normocephalic, atraumatic . Neck Global Assessment - supple, no bruit auscultated on the right, no bruit auscultated on the left.  Eye Pupil - Bilateral-Regular and Round. Motion - Bilateral-EOMI.  Chest and Lung  Exam Auscultation Breath sounds - clear at anterior chest wall and clear at posterior chest wall. Adventitious sounds - No Adventitious sounds.  Cardiovascular Auscultation Rhythm - Regular rate and rhythm. Heart Sounds - S1 WNL and S2 WNL. Murmurs & Other Heart Sounds - Auscultation of the heart reveals - No Murmurs.  Abdomen Palpation/Percussion Tenderness - Abdomen is non-tender to palpation. Rigidity (guarding) - Abdomen is soft. Auscultation Auscultation of the abdomen reveals - Bowel sounds normal.  Female Genitourinary Note: Not done, not pertinent to present illness  Musculoskeletal Note: Moderate distress, walks with an antalgic gait. Mood and affect is slightly anxious. Knee with slight valgus deformity, tender medial joint line, patellofemoral pain on compression, trace effusion, tender laterally. Her range is -5 to 90. Negative anterior drawer, ipsilateral hip and ankle exam is unremarkable. She had a history of foot drop on the right, although has dorsiflexion of 5-/5.  Imaging x-rays AP and lateral demonstrate tricompartmental osteoarthrosis with slight valgus deformity.  Assessment & Plan Primary osteoarthritis of left knee (M17.12)  Pt with end-stage knee L DJD, grade 4 wear on arthroscopy 3 years ago, refractory to conservative tx, scheduled for LEft total knee replacement by Dr. Beane on 11/05/15. We again discussed the procedure itself as well as risks, complications and alternatives, including but not limited to DVT, PE, infx, bleeding, failure of procedure, need for secondary procedure including manipulation, nerve injury, ongoing pain/symptoms, anesthesia risk, even stroke or death. Also discussed typical post-op protocols, activity restrictions, need for PT, flexion/extension exercises, time out of work. Discussed need for DVT ppx post-op with Xarelto then ASA per protocol. Discussed dental ppx. Also discussed limitations post-operatively such as kneeling and  squatting. All questions were answered. Patient desires to proceed with surgery as scheduled. Discussed management of MS peri-operatively. Will hold ASA and NSAIDs accordingly. Will remain NPO after MN night before surgery. [facility] present to [facility] for pre-op testing. Plan Xarelto 2 weeks post-op for DVT ppx then ASA. Plan pain medication, Robaxin, Colace. Plan D/C to [facility] initially post-op due to weakness, foot drop, and MS, then D/C to home with family members at home for assistance and outpt PT. Will follow up 10-14 days post-op for suture removal and xrays.  Plan left total knee arthroplasty  Signed electronically by Jaclyn M Bissell, PA-C  For Dr. Beane  

## 2015-11-05 NOTE — Anesthesia Procedure Notes (Signed)
Procedure Name: Intubation Date/Time: 11/05/2015 7:39 AM Performed by: Early Osmond E Pre-anesthesia Checklist: Patient identified, Emergency Drugs available, Suction available and Patient being monitored Patient Re-evaluated:Patient Re-evaluated prior to inductionOxygen Delivery Method: Circle System Utilized Preoxygenation: Pre-oxygenation with 100% oxygen Intubation Type: IV induction Ventilation: Mask ventilation without difficulty Laryngoscope Size: Glidescope and 4 Grade View: Grade I Tube type: Oral Tube size: 7.0 mm Number of attempts: 3 Airway Equipment and Method: Stylet and Video-laryngoscopy Placement Confirmation: ETT inserted through vocal cords under direct vision,  positive ETCO2 and breath sounds checked- equal and bilateral Secured at: 21 cm Tube secured with: Tape Dental Injury: Teeth and Oropharynx as per pre-operative assessment  Difficulty Due To: Difficulty was anticipated, Difficult Airway- due to anterior larynx and Difficult Airway- due to limited oral opening Comments: DL w/Miller 2, limited mouth opening, anterior a/w, change to Glidescope with limited view w/ size 3 by CRNA, Size 4 glidescope by MDA w/ view of cords, Atraumatic placement ETT. Easy mask ventilation throughout procedure

## 2015-11-06 LAB — CBC
HCT: 34.2 % — ABNORMAL LOW (ref 36.0–46.0)
Hemoglobin: 11.3 g/dL — ABNORMAL LOW (ref 12.0–15.0)
MCH: 30.5 pg (ref 26.0–34.0)
MCHC: 33 g/dL (ref 30.0–36.0)
MCV: 92.2 fL (ref 78.0–100.0)
Platelets: 199 10*3/uL (ref 150–400)
RBC: 3.71 MIL/uL — ABNORMAL LOW (ref 3.87–5.11)
RDW: 13.2 % (ref 11.5–15.5)
WBC: 7.3 10*3/uL (ref 4.0–10.5)

## 2015-11-06 LAB — BASIC METABOLIC PANEL
Anion gap: 4 — ABNORMAL LOW (ref 5–15)
BUN: 20 mg/dL (ref 6–20)
CO2: 31 mmol/L (ref 22–32)
Calcium: 8.1 mg/dL — ABNORMAL LOW (ref 8.9–10.3)
Chloride: 98 mmol/L — ABNORMAL LOW (ref 101–111)
Creatinine, Ser: 0.94 mg/dL (ref 0.44–1.00)
GFR calc Af Amer: >60 mL/min (ref >60)
GFR calc non Af Amer: >60 mL/min (ref >60)
Glucose, Bld: 149 mg/dL — ABNORMAL HIGH (ref 65–99)
Potassium: 4 mmol/L (ref 3.5–5.1)
Sodium: 133 mmol/L — ABNORMAL LOW (ref 135–145)

## 2015-11-06 NOTE — Discharge Instructions (Signed)
Elevate leg above heart 6x a day for 20minutes each °Use knee immobilizer while walking until can SLR x 10 °Use knee immobilizer in bed to keep knee in extension °Aquacel dressing may remain in place until follow up. May shower with aquacel dressing in place. If the dressing becomes saturated or peels off, you may remove aquacel dressing. Do not remove steri-strips if they are present. Place new dressing with gauze and tape or ACE bandage which should be kept clean and dry and changed daily. ° °INSTRUCTIONS AFTER JOINT REPLACEMENT  ° °o Remove items at home which could result in a fall. This includes throw rugs or furniture in walking pathways °o ICE to the affected joint every three hours while awake for 30 minutes at a time, for at least the first 3-5 days, and then as needed for pain and swelling.  Continue to use ice for pain and swelling. You may notice swelling that will progress down to the foot and ankle.  This is normal after surgery.  Elevate your leg when you are not up walking on it.   °o Continue to use the breathing machine you got in the hospital (incentive spirometer) which will help keep your temperature down.  It is common for your temperature to cycle up and down following surgery, especially at night when you are not up moving around and exerting yourself.  The breathing machine keeps your lungs expanded and your temperature down. ° ° °DIET:  As you were doing prior to hospitalization, we recommend a well-balanced diet. ° °DRESSING / WOUND CARE / SHOWERING ° °Keep the surgical dressing until follow up.  The dressing is water proof, so you can shower without any extra covering.  IF THE DRESSING FALLS OFF or the wound gets wet inside, change the dressing with sterile gauze.  Please use good hand washing techniques before changing the dressing.  Do not use any lotions or creams on the incision until instructed by your surgeon.   ° °ACTIVITY ° °o Increase activity slowly as tolerated, but follow the  weight bearing instructions below.   °o No driving for 6 weeks or until further direction given by your physician.  You cannot drive while taking narcotics.  °o No lifting or carrying greater than 10 lbs. until further directed by your surgeon. °o Avoid periods of inactivity such as sitting longer than an hour when not asleep. This helps prevent blood clots.  °o You may return to work once you are authorized by your doctor.  ° ° ° °WEIGHT BEARING  ° °Weight bearing as tolerated with assist device (walker, cane, etc) as directed, use it as long as suggested by your surgeon or therapist, typically at least 4-6 weeks. ° ° °EXERCISES ° °Results after joint replacement surgery are often greatly improved when you follow the exercise, range of motion and muscle strengthening exercises prescribed by your doctor. Safety measures are also important to protect the joint from further injury. Any time any of these exercises cause you to have increased pain or swelling, decrease what you are doing until you are comfortable again and then slowly increase them. If you have problems or questions, call your caregiver or physical therapist for advice.  ° °Rehabilitation is important following a joint replacement. After just a few days of immobilization, the muscles of the leg can become weakened and shrink (atrophy).  These exercises are designed to build up the tone and strength of the thigh and leg muscles and to improve motion. Often   times heat used for twenty to thirty minutes before working out will loosen up your tissues and help with improving the range of motion but do not use heat for the first two weeks following surgery (sometimes heat can increase post-operative swelling).  ° °These exercises can be done on a training (exercise) mat, on the floor, on a table or on a bed. Use whatever works the best and is most comfortable for you.    Use music or television while you are exercising so that the exercises are a pleasant  break in your day. This will make your life better with the exercises acting as a break in your routine that you can look forward to.   Perform all exercises about fifteen times, three times per day or as directed.  You should exercise both the operative leg and the other leg as well. ° °Exercises include: °  °• Quad Sets - Tighten up the muscle on the front of the thigh (Quad) and hold for 5-10 seconds.   °• Straight Leg Raises - With your knee straight (if you were given a brace, keep it on), lift the leg to 60 degrees, hold for 3 seconds, and slowly lower the leg.  Perform this exercise against resistance later as your leg gets stronger.  °• Leg Slides: Lying on your back, slowly slide your foot toward your buttocks, bending your knee up off the floor (only go as far as is comfortable). Then slowly slide your foot back down until your leg is flat on the floor again.  °• Angel Wings: Lying on your back spread your legs to the side as far apart as you can without causing discomfort.  °• Hamstring Strength:  Lying on your back, push your heel against the floor with your leg straight by tightening up the muscles of your buttocks.  Repeat, but this time bend your knee to a comfortable angle, and push your heel against the floor.  You may put a pillow under the heel to make it more comfortable if necessary.  ° °A rehabilitation program following joint replacement surgery can speed recovery and prevent re-injury in the future due to weakened muscles. Contact your doctor or a physical therapist for more information on knee rehabilitation.  ° ° °CONSTIPATION ° °Constipation is defined medically as fewer than three stools per week and severe constipation as less than one stool per week.  Even if you have a regular bowel pattern at home, your normal regimen is likely to be disrupted due to multiple reasons following surgery.  Combination of anesthesia, postoperative narcotics, change in appetite and fluid intake all can  affect your bowels.  ° °YOU MUST use at least one of the following options; they are listed in order of increasing strength to get the job done.  They are all available over the counter, and you may need to use some, POSSIBLY even all of these options:   ° °Drink plenty of fluids (prune juice may be helpful) and high fiber foods °Colace 100 mg by mouth twice a day  °Senokot for constipation as directed and as needed Dulcolax (bisacodyl), take with full glass of water  °Miralax (polyethylene glycol) once or twice a day as needed. ° °If you have tried all these things and are unable to have a bowel movement in the first 3-4 days after surgery call either your surgeon or your primary doctor.   ° °If you experience loose stools or diarrhea, hold the medications until you stool   forms back up.  If your symptoms do not get better within 1 week or if they get worse, check with your doctor.  If you experience "the worst abdominal pain ever" or develop nausea or vomiting, please contact the office immediately for further recommendations for treatment. ° ° °ITCHING:  If you experience itching with your medications, try taking only a single pain pill, or even half a pain pill at a time.  You can also use Benadryl over the counter for itching or also to help with sleep.  ° °TED HOSE STOCKINGS:  Use stockings on both legs until for at least 2 weeks or as directed by physician office. They may be removed at night for sleeping. ° °MEDICATIONS:  See your medication summary on the “After Visit Summary” that nursing will review with you.  You may have some home medications which will be placed on hold until you complete the course of blood thinner medication.  It is important for you to complete the blood thinner medication as prescribed. ° °PRECAUTIONS:  If you experience chest pain or shortness of breath - call 911 immediately for transfer to the hospital emergency department.  ° °If you develop a fever greater that 101 F, purulent  drainage from wound, increased redness or drainage from wound, foul odor from the wound/dressing, or calf pain - CONTACT YOUR SURGEON.   °                                                °FOLLOW-UP APPOINTMENTS:  If you do not already have a post-op appointment, please call the office for an appointment to be seen by your surgeon.  Guidelines for how soon to be seen are listed in your “After Visit Summary”, but are typically between 1-4 weeks after surgery. ° °OTHER INSTRUCTIONS:  ° °Knee Replacement:  Do not place pillow under knee, focus on keeping the knee straight while resting. CPM instructions: 0-90 degrees, 2 hours in the morning, 2 hours in the afternoon, and 2 hours in the evening. Place foam block, curve side up under heel at all times except when in CPM or when walking.  DO NOT modify, tear, cut, or change the foam block in any way. ° °MAKE SURE YOU:  °• Understand these instructions.  °• Get help right away if you are not doing well or get worse.  ° ° °Thank you for letting us be a part of your medical care team.  It is a privilege we respect greatly.  We hope these instructions will help you stay on track for a fast and full recovery!  ° ° °Information on my medicine - XARELTO® (Rivaroxaban) ° °This medication education was reviewed with me or my healthcare representative as part of my discharge preparation.  The pharmacist that spoke with me during my hospital stay was:  Priti Consoli, RPH ° °Why was Xarelto® prescribed for you? °Xarelto® was prescribed for you to reduce the risk of blood clots forming after orthopedic surgery. The medical term for these abnormal blood clots is venous thromboembolism (VTE). ° °What do you need to know about xarelto® ? °Take your Xarelto® ONCE DAILY at the same time every day. °You may take it either with or without food. ° °If you have difficulty swallowing the tablet whole, you may crush it and mix in applesauce just prior to taking your   dose. ° °Take Xarelto®  exactly as prescribed by your doctor and DO NOT stop taking Xarelto® without talking to the doctor who prescribed the medication.  Stopping without other VTE prevention medication to take the place of Xarelto® may increase your risk of developing a clot. ° °After discharge, you should have regular check-up appointments with your healthcare provider that is prescribing your Xarelto®.   ° °What do you do if you miss a dose? °If you miss a dose, take it as soon as you remember on the same day then continue your regularly scheduled once daily regimen the next day. Do not take two doses of Xarelto® on the same day.  ° °Important Safety Information °A possible side effect of Xarelto® is bleeding. You should call your healthcare provider right away if you experience any of the following: °? Bleeding from an injury or your nose that does not stop. °? Unusual colored urine (red or dark brown) or unusual colored stools (red or black). °? Unusual bruising for unknown reasons. °? A serious fall or if you hit your head (even if there is no bleeding). ° °Some medicines may interact with Xarelto® and might increase your risk of bleeding while on Xarelto®. To help avoid this, consult your healthcare provider or pharmacist prior to using any new prescription or non-prescription medications, including herbals, vitamins, non-steroidal anti-inflammatory drugs (NSAIDs) and supplements. ° °This website has more information on Xarelto®: www.xarelto.com. ° ° ° °

## 2015-11-06 NOTE — Evaluation (Signed)
Physical Therapy Evaluation Patient Details Name: [name] MRN: [id number] DOB: 03-29-1961 Today's Date: 11/06/2015   History of Present Illness  DJD left knee s/p L TKA; history of multiple sclerosis  Clinical Impression  Pt s/p L TKR presents with decreased L LE strength/ROM, decreased R LE strength 2* MS, and post op pain limiting functional mobility.  Pt would benefit from follow up rehab at SNF level to maximize IND and safety prior to return home with ltd assist.    Follow Up Recommendations SNF    Equipment Recommendations  None recommended by PT    Recommendations for Other Services OT consult     Precautions / Restrictions Precautions Precautions: Fall Required Braces or Orthoses: Knee Immobilizer - Left Knee Immobilizer - Left: Discontinue once straight leg raise with < 10 degree lag Restrictions Weight Bearing Restrictions: No LLE Weight Bearing: Weight bearing as tolerated      Mobility  Bed Mobility Overal bed mobility: Needs Assistance Bed Mobility: Supine to Sit     Supine to sit: Min assist;Mod assist     General bed mobility comments: cues for sequence and use of R LE to self assist  Transfers Overall transfer level: Needs assistance Equipment used: Rolling walker (2 wheeled) Transfers: Sit to/from Stand Sit to Stand: Mod assist;From elevated surface         General transfer comment: cues for LE management and use of UEs to self assist  Ambulation/Gait Ambulation/Gait assistance: Min assist;Mod assist Ambulation Distance (Feet): 40 Feet Assistive device: Rolling walker (2 wheeled) Gait Pattern/deviations: Step-to pattern;Decreased step length - right;Decreased step length - left;Shuffle;Trunk flexed     General Gait Details: cues for sequence, posture, stride length and position from [employer]            Wheelchair Mobility    Modified Rankin (Stroke Patients Only)       Balance                                              Pertinent Vitals/Pain Pain Assessment: 0-10 Pain Score: 6  Pain Location: L knee/thigh Pain Descriptors / Indicators: Aching;Sore Pain Intervention(s): Limited activity within patient's tolerance;Monitored during session;Premedicated before session;Ice applied    Home Living Family/patient expects to be discharged to:: Skilled nursing facility Living Arrangements: Spouse/significant other Available Help at Discharge: Family;Available PRN/intermittently Type of Home: House Home Access: Stairs to enter   Entrance Stairs-Number of Steps: 5 Home Layout: Two level;Bed/bath upstairs Home Equipment: Walker - 2 wheels;Bedside commode      Prior Function Level of Independence: Independent with assistive device(s)         Comments: uses cane or RW as needed     Hand Dominance        Extremity/Trunk Assessment   Upper Extremity Assessment: Overall WFL for tasks assessed           Lower Extremity Assessment: RLE deficits/detail;LLE deficits/detail RLE Deficits / Details: ROM WFL with partial drop foot, 4-/5 quads and 3/5 hip strength LLE Deficits / Details: 2/5 quads with AAROM at knee -10- 40  Cervical / Trunk Assessment: Kyphotic  Communication   Communication: No difficulties  Cognition Arousal/Alertness: Awake/alert Behavior During Therapy: WFL for tasks assessed/performed Overall Cognitive Status: Within Functional Limits for tasks assessed  General Comments      Exercises Total Joint Exercises Ankle Circles/Pumps: AROM;Both;15 reps;Supine Quad Sets: AROM;Both;10 reps;Supine Heel Slides: AAROM;10 reps;Left;Supine Straight Leg Raises: AAROM;Left;10 reps;Supine      Assessment/Plan    PT Assessment Patient needs continued PT services  PT Diagnosis Difficulty walking   PT Problem List Decreased strength;Decreased range of motion;Decreased activity tolerance;Decreased mobility;Decreased knowledge of use  of DME;Pain;Decreased safety awareness;Decreased knowledge of precautions  PT Treatment Interventions DME instruction;Gait training;Functional mobility training;Therapeutic activities;Therapeutic exercise;Patient/family education   PT Goals (Current goals can be found in the Care Plan section) Acute Rehab PT Goals Patient Stated Goal: to go to rehab  PT Goal Formulation: With patient Time For Goal Achievement: 11/11/15 Potential to Achieve Goals: Good    Frequency 7X/week   Barriers to discharge        Co-evaluation               End of Session Equipment Utilized During Treatment: Gait belt;Left knee immobilizer Activity Tolerance: Patient tolerated treatment well Patient left: in chair;with call bell/phone within reach;with chair alarm set Nurse Communication: Mobility status         Time: 1610-9604 PT Time Calculation (min) (ACUTE ONLY): 30 min   Charges:   PT Evaluation $Initial PT Evaluation Tier I: 1 Procedure PT Treatments $Therapeutic Exercise: 8-22 mins   PT G Codes:        Ayaana Biondo 12/06/2015, 11:33 AM

## 2015-11-06 NOTE — Clinical Social Work Placement (Signed)
   CLINICAL SOCIAL WORK PLACEMENT  NOTE  Date:  11/06/2015  Patient Details  Name: [name] MRN: [id number] Date of Birth: 08-06-1961  Clinical Social Work is seeking post-discharge placement for this patient at the Skilled  Nursing Facility level of care (*CSW will initial, date and re-position this form in  chart as items are completed):  No   Patient/family provided with [facility] Clinical Social Work Department's list of facilities offering this level of care within the geographic area requested by the patient (or if unable, by the patient's family).  Yes   Patient/family informed of their freedom to choose among providers that offer the needed level of care, that participate in Medicare, Medicaid or managed care program needed by the patient, have an available bed and are willing to accept the patient.  No   Patient/family informed of [facility]'s ownership interest in [facility] and [facility], as well as of the fact that they are under no obligation to receive care at these facilities.  PASRR submitted to EDS on 11/06/15     PASRR number received on 11/06/15     Existing PASRR number confirmed on       FL2 transmitted to all facilities in geographic area requested by pt/family on 11/06/15     FL2 transmitted to all facilities within larger geographic area on       Patient informed that his/her managed care company has contracts with or will negotiate with certain facilities, including the following:            Patient/family informed of bed offers received.  Patient chooses bed at       Physician recommends and patient chooses bed at      Patient to be transferred to   on  .  Patient to be transferred to facility by       Patient family notified on   of transfer.  Name of family member notified:        PHYSICIAN       Additional Comment:    _______________________________________________ Royetta Asal, [place]  [phone] 11/06/2015, 10:31  AM

## 2015-11-06 NOTE — NC FL2 (Signed)
[place] MEDICAID FL2 LEVEL OF CARE SCREENING TOOL     IDENTIFICATION  Patient Name: [name] Birthdate: June 06, 1961 Sex: female Admission Date (Current Location): 11/05/2015  [facility] and [place] Number:     Facility and Address:  [facility],  [address], [place] [address]      Provider Number: [id number]  Attending Physician Name and Address:  Jene Every, MD  Relative Name and Phone Number:       Current Level of Care: Hospital Recommended Level of Care: Skilled Nursing Facility Prior Approval Number:    Date Approved/Denied:   PASRR Number: [id number]  Discharge Plan: SNF    Current Diagnoses: Patient Active Problem List   Diagnosis Date Noted  . 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 Preop cardiovascular exam 03/20/2015  . DJD (degenerative joint disease) 02/12/2015  . Chest pain 12/28/2014  . Paroxysmal atrial fibrillation (HCC) 12/28/2014  . Multiple sclerosis (HCC) 12/22/2014  . Abnormality of gait 12/22/2014  . Attention deficit disorder 12/22/2014  . Chronic fatigue 12/22/2014  . Urinary hesitancy 12/22/2014  . Adjustment disorder with mixed anxiety and depressed mood 12/22/2014  . 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 Surgical wounds          AMBULATORY STATUS RESPIRATION     (Limited assist) Normal      Personal Care Assistance Level of Assistance  Bathing, Feeding, Dressing Bathing Assistance: Limited assistance Feeding assistance: Independent Dressing Assistance: Limited assistance      Functional Limitations Info  Sight, Hearing, Speech Sight Info: Adequate Hearing Info: Adequate Speech Info: Adequate       SPECIAL  CARE FACTORS FREQUENCY  PT (By licensed PT), OT (By licensed OT)     PT Frequency: 6-7 x wkly OT Frequency: 6-7 x wkly           Additional Factors Info  Code Status, Allergies, Psychotropic Code Status Info: FULL CODE             Current Medications (11/06/2015):  This is the current hospital active medication list Current Facility-Administered Medications  Medication Dose Route Frequency Provider Last Rate Last Dose  . acetaminophen (TYLENOL) tablet 650 mg  650 mg Oral Q6H PRN Jene Every, MD       Or  . acetaminophen (TYLENOL) suppository 650 mg  650 mg Rectal Q6H PRN Jene Every, MD      . acidophilus (RISAQUAD) capsule 1 capsule  1 capsule Oral Daily Jene Every, MD   1 capsule at 11/06/15 0948  . ALPRAZolam Prudy Feeler) tablet 0.5 mg  0.5 mg Oral TID PRN Jene Every, MD   0.5 mg at 11/05/15 1157  . alum & mag hydroxide-simeth (MAALOX/MYLANTA) 200-200-20 MG/5ML suspension 30 mL  30 mL Oral Q4H PRN Jene Every, MD      . bisacodyl (DULCOLAX) EC tablet 5 mg  5 mg Oral Daily PRN Jene Every, MD      . dextrose 5 % and 0.45 % NaCl with KCl 20 mEq/L infusion   Intravenous Continuous Jene Every, MD 50 mL/hr at 11/05/15 1438 50 mL/hr at 11/05/15 1438  . Dimethyl Fumarate CPDR 240 mg  240 mg Oral BID Jene Every, MD   240 mg at  11/06/15 0951  . diphenhydrAMINE (BENADRYL) 12.5 MG/5ML elixir 12.5-25 mg  12.5-25 mg Oral Q4H PRN Jene Every, MD      . docusate sodium (COLACE) capsule 100 mg  100 mg Oral BID Jene Every, MD   100 mg at 11/06/15 0948  . gabapentin (NEURONTIN) capsule 600 mg  600 mg Oral QID Jene Every, MD   600 mg at 11/06/15 0948  . HYDROmorphone (DILAUDID) injection 1 mg  1 mg Intravenous Q2H PRN Jene Every, MD   1 mg at 11/06/15 0913  . hyoscyamine (LEVSIN SL) SL tablet 0.125 mg  0.125 mg Sublingual Q4H PRN Jene Every, MD      . lactated ringers infusion   Intravenous Continuous Dorothy Spark, PA-C 125 mL/hr at 11/05/15 1045    . magnesium  citrate solution 1 Bottle  1 Bottle Oral Once PRN Jene Every, MD      . menthol-cetylpyridinium (CEPACOL) lozenge 3 mg  1 lozenge Oral PRN Jene Every, MD       Or  . phenol (CHLORASEPTIC) mouth spray 1 spray  1 spray Mouth/Throat PRN Jene Every, MD      . methocarbamol (ROBAXIN) tablet 500 mg  500 mg Oral Q6H PRN Jene Every, MD   500 mg at 11/06/15 0557   Or  . methocarbamol (ROBAXIN) 500 mg in dextrose 5 % 50 mL IVPB  500 mg Intravenous Q6H PRN Jene Every, MD   500 mg at 11/05/15 1038  . methylphenidate (RITALIN) tablet 20 mg  20 mg Oral Q lunch Jene Every, MD   Stopped at 11/05/15 1333  . methylphenidate (RITALIN) tablet 40 mg  40 mg Oral Q breakfast Jene Every, MD   40 mg at 11/06/15 1610  . metoCLOPramide (REGLAN) tablet 5-10 mg  5-10 mg Oral Q8H PRN Jene Every, MD       Or  . metoCLOPramide (REGLAN) injection 5-10 mg  5-10 mg Intravenous Q8H PRN Jene Every, MD      . metoprolol tartrate (LOPRESSOR) tablet 25 mg  25 mg Oral BID PRN Jene Every, MD      . ondansetron [facility]) tablet 4 mg  4 mg Oral Q6H PRN Jene Every, MD       Or  . ondansetron [facility]) injection 4 mg  4 mg Intravenous Q6H PRN Jene Every, MD      . oxyCODONE (Oxy IR/ROXICODONE) immediate release tablet 5-10 mg  5-10 mg Oral Q3H PRN Jene Every, MD   5 mg at 11/06/15 0948  . rivaroxaban (XARELTO) tablet 10 mg  10 mg Oral Q breakfast Jene Every, MD   10 mg at 11/06/15 0858  . senna-docusate (Senokot-S) tablet 1 tablet  1 tablet Oral QHS PRN Jene Every, MD         Discharge Medications: Please see discharge summary for a list of discharge medications.  Relevant Imaging Results:  Relevant Lab Results:  Recent Labs    Additional Information SS # [id number]  [name], [name], LCSW

## 2015-11-06 NOTE — Op Note (Signed)
[name], [name]               ACCOUNT NO.:  [id number]  MEDICAL RECORD NO.:  [id number]  LOCATION:  1615                         FACILITY:  [facility]  PHYSICIAN:  Jene Every, M.D.    DATE OF BIRTH:  Oct 26, 1961  DATE OF PROCEDURE:  11/05/2015 DATE OF DISCHARGE:                              OPERATIVE REPORT   PREOPERATIVE DIAGNOSIS:  End-stage osteoarthrosis with valgus deformity, left knee.  POSTOPERATIVE DIAGNOSIS:  End-stage osteoarthrosis with valgus deformity, left knee.  PROCEDURE PERFORMED:  Left total knee arthroplasty.  COMPONENTS:  DePuy rotating platform, 4 narrow femur, 3 tibia, 10 mm insert, 35 patella.  ANESTHESIA:  General.  ASSISTANT:  Lanna Poche, PA  HISTORY:  A 54 year old, bone-on-bone arthrosis, valgus deformity, refractory to conservative treatment.  The patient had a history of MS. There was severe negative effect to her activities of daily living. Failing conservative treatment was indicated for replacement of the degenerated joint.  Risks and benefits were discussed including bleeding, infection, damage to neurovascular structures, DVT, PE, suboptimal range of motion, need for revision, component failure, etc.  TECHNIQUE:  With the patient in supine position, after induction of adequate anesthesia 2 g Kefzol, left lower extremity was prepped and draped in usual sterile fashion.  She had a slight flexion contracture. She had a mild valgus deformity.  We exsanguinated the extremity with thigh tourniquet inflated to 275 mmHg.  Midline incision was then made over the knee.  Full-thickness flaps developed.  Median parapatellar arthrotomy performed.  Patella everted and knee flexed. Tricompartmental bone-on-bone arthrosis was noted.  We removed osteophytes with a Beyer rongeur, removed remnants of the medial lateral meniscus and the ACL.  Minimal dissections of the soft tissue medially. Step drill was utilized to enter the femoral canal at the  notch.  It was irrigated and intramedullary guide 5 degree left was placed and then pinned.  We had pinned off the distal femur which would provide cuts both in the medial femoral condyle and lateral femoral condyle about 2 mm laterally.  We performed our distal femoral cut.  We then sized off the anterior femur.  This was to essentially 4.  This was then pinned and 3 degrees of external rotation, we placed our block and performed our anterior, posterior, and chamfer cuts.  We did not notch the anterior cortex.  We had adequate resection posteriorly.  I then subluxed the tibia.  Osteophytes removed with a Beyer rongeur.  External alignment guide, 3 degree posterior slope bisecting the tibiotalar joint.  There were 2 defects medially and laterally.  We first used 2 off the medial defect.  Minimized our cuts for valgus deformity.  We made our tibial cut.  Soft tissues protected at all times.  We then trialed our flexion-extension gaps, and they were tied in both flexion and extension.  This required an additional 2 and then I used additional 2 mm of a cut to provide equal flexion and extension gaps that were satisfactory.  Following this, we turned our attention back towards completing the tibia, it was subluxed.  We tried a 4 which overhung posterolaterally, therefore used a 3 which was optimal just to the medial aspect of the tibial  tubercle, this was pinned, we drilled centrally, used our punch guide.  Then, we turned towards completing the femur.  We used a trial narrow 4 to pre-mark the box cut which was then made bisecting the canal flushed to the lateral condyle, this was then pinned and our box cut performed then we placed a trial 4 femur, a 10 mm insert, we had full extension and full flexion.  Negative anterior drawer and good stability, varus valgus stressing 0-30 degrees. Attention turned towards the patella measured to a 21.  We planed 7.5 from that.  The residual was 14,  measured at a 35, we drilled our PEG holes medializing them for the patella.  Trial patella was placed and we had excellent patellofemoral tracking.  All trials were removed.  We checked posteriorly any remnants of the menisci, removed, cauterized our geniculates used, pulsatile lavage to clean the joint.  The knee was then flexed, tibia subluxed.  Retractors placed, mixed cement on the back table in appropriate fashion.  Injected it under pressure into the tibial canal digitally pressurizing the cement.  We impacted our 3 tibial tray, redundant cement removed.  We then cemented the 4 narrow femur, redundant cement removed, placed a trial 10 insert, held it in extension with an axial load throughout the curing of the cement, redundant cement removed.  We cemented and the patellar 35 component and held it with a clamp throughout the curing of the cement, 0.25% Marcaine with epinephrine was then infiltrated in the periarticular tissues. After appropriate curing of the cement, we had full extension and full flexion and excellent stability, removed the 10 insert, checked posteriorly meticulously, removed all cement from the posterior joint anteriorly and the medial and lateral aspect of the joint.  Then, we used pulsatile lavage in the joint and then antibiotic irrigation, subluxed the tibia, and placed a 10 permanent insert, reduced the knee and we had full extension, full flexion, excellent patellofemoral tracking and excellent stability, varus valgus stressing 0-30 degrees and negative anterior drawer.  Tourniquet was then deflated.  Good revascularization of lower extremity.  Minimal bleeding was noted.  We therefore selected not to use a drain.  We reapproximated the patellar leg with #1 Vicryl interrupted figure-of-eight sutures in a running V- Loc on top of that.  We then had full flexion to 140, full extension, good stability, varus valgus stressing 0-30 degrees and  excellent patellofemoral tracking.  Copiously irrigated the wound again, closed the subcu with 2-0, and skin with subcuticular Prolene.  Sterile dressing was applied.  She had flexion prior to that gravity at 90 degrees.  Sterile dressing was applied, placed in immobilizer, extubated without difficulty, and transported to the recovery room in satisfactory condition. The patient tolerated the procedure well.  No complications.  Assistant, Lanna Poche, [place].  Minimal blood loss.  Tourniquet time 82 minutes.     Jene Every, M.D.     Cordelia Pen  D:  11/05/2015  T:  11/06/2015  Job:  [id number]

## 2015-11-06 NOTE — Progress Notes (Signed)
Physical Therapy Treatment Patient Details Name: [name] MRN: [id number] DOB: 03-18-1961 Today's Date: 11/06/2015    History of Present Illness DJD left knee s/p L TKA; history of multiple sclerosis    PT Comments    Pt motivated and progressing steadily with mobility.  Pt hopeful for progression to [facility] for rehab tomorrow.  Follow Up Recommendations  SNF     Equipment Recommendations  None recommended by PT    Recommendations for Other Services OT consult     Precautions / Restrictions Precautions Precautions: Fall Required Braces or Orthoses: Knee Immobilizer - Left Knee Immobilizer - Left: Discontinue once straight leg raise with < 10 degree lag Restrictions Weight Bearing Restrictions: No LLE Weight Bearing: Weight bearing as tolerated    Mobility  Bed Mobility Overal bed mobility: Needs Assistance Bed Mobility: Supine to Sit;Sit to Supine     Supine to sit: Min assist Sit to supine: Min assist   General bed mobility comments: cues for sequence and use of R LE to self assist  Transfers Overall transfer level: Needs assistance Equipment used: Rolling walker (2 wheeled) Transfers: Sit to/from Stand Sit to Stand: Min assist;Mod assist         General transfer comment: cues for LE management and use of UEs to self assist  Ambulation/Gait Ambulation/Gait assistance: Min assist;Mod assist Ambulation Distance (Feet): 82 Feet (and 15' to bathroom) Assistive device: Rolling walker (2 wheeled) Gait Pattern/deviations: Step-to pattern;Decreased step length - right;Decreased step length - left;Shuffle;Trunk flexed Gait velocity: decr Gait velocity interpretation: Below normal speed for age/gender General Gait Details: cues for sequence, posture, stride length and position from [employer]   Stairs            Wheelchair Mobility    Modified Rankin (Stroke Patients Only)       Balance                                     Cognition Arousal/Alertness: Awake/alert Behavior During Therapy: WFL for tasks assessed/performed Overall Cognitive Status: Within Functional Limits for tasks assessed                      Exercises      General Comments        Pertinent Vitals/Pain Pain Assessment: 0-10 Pain Score: 7  Pain Location: L knee/thigh Pain Descriptors / Indicators: Aching;Sore Pain Intervention(s): Limited activity within patient's tolerance;Monitored during session;Premedicated before session;Ice applied;Patient requesting pain meds-RN notified    Home Living                      Prior Function            PT Goals (current goals can now be found in the care plan section) Acute Rehab PT Goals Patient Stated Goal: to go to rehab  PT Goal Formulation: With patient Time For Goal Achievement: 11/11/15 Potential to Achieve Goals: Good Progress towards PT goals: Progressing toward goals    Frequency  7X/week    PT Plan Current plan remains appropriate    Co-evaluation             End of Session Equipment Utilized During Treatment: Gait belt;Left knee immobilizer Activity Tolerance: Patient tolerated treatment well Patient left: in bed;with call bell/phone within reach;with family/visitor present     Time: 4098-1191 PT Time Calculation (min) (ACUTE ONLY): 31 min  Charges:  $  Gait Training: 23-37 mins                    G Codes:      Seleny Allbright 11/16/2015, 4:57 PM

## 2015-11-06 NOTE — Progress Notes (Signed)
Subjective: 1 Day Post-Op Procedure(s) (LRB): LEFT TOTAL KNEE ARTHROPLASTY (Left) Patient reports pain as 4 on 0-10 scale.    Objective: Vital signs in last 24 hours: Temp:  [98.4 F (36.9 C)-99.6 F (37.6 C)] 98.4 F (36.9 C) (12/02 1155) Pulse Rate:  [64-92] 92 (12/02 1155) Resp:  [16-18] 16 (12/02 1155) BP: (124-141)/(60-83) 133/83 mmHg (12/02 1155) SpO2:  [94 %-100 %] 97 % (12/02 1155)  Intake/Output from previous day: 12/01 0701 - 12/02 0700 In: 4298.3 [P.O.:1380; I.V.:2868.3; IV Piggyback:50] Out: 1725 [Urine:1725] Intake/Output this shift: Total I/O In: -  Out: 250 [Urine:250]   Recent Labs  11/06/15 0504  HGB 11.3*    Recent Labs  11/06/15 0504  WBC 7.3  RBC 3.71*  HCT 34.2*  PLT 199    Recent Labs  11/06/15 0504  NA 133*  K 4.0  CL 98*  CO2 31  BUN 20  CREATININE 0.94  GLUCOSE 149*  CALCIUM 8.1*   No results for input(s): LABPT, INR in the last 72 hours.  Neurologically intact Sensation intact distally Dorsiflexion/Plantar flexion intact Incision: dressing C/D/I  Assessment/Plan: 1 Day Post-Op Procedure(s) (LRB): LEFT TOTAL KNEE ARTHROPLASTY (Left) Advance diet Up with therapy Discharge to SNF tomorrow.  Bria Sparr C 11/06/2015, 1:25 PM

## 2015-11-06 NOTE — Progress Notes (Signed)
Occupational Therapy Evaluation Patient Details Name: Lynn ELLINGWOOD MRN: [id number] DOB: [birth year] Today's Date: 11/06/2015    History of Present Illness DJD left knee s/p L TKA; history of multiple sclerosis   Clinical Impression   Patient presents to OT with decreased ADL independence and safety s/p L TKR with history of MS. Patient will benefit from short term rehab prior to discharge home. Today, patient limited by pain. OT will follow.    Follow Up Recommendations  SNF    Equipment Recommendations  Tub/shower bench (tbd by OT at [facility])    Recommendations for Other Services PT consult     Precautions / Restrictions Precautions Precautions: Fall Restrictions Weight Bearing Restrictions: Yes LLE Weight Bearing: Weight bearing as tolerated      Mobility Bed Mobility                  Transfers                      Balance                                            ADL Overall ADL's : Needs assistance/impaired Eating/Feeding: Independent   Grooming: Wash/dry hands;Wash/dry face;Set up;Bed level   Upper Body Bathing: Set up;Sitting;Bed level   Lower Body Bathing: Minimal assistance;Bed level;Moderate assistance   Upper Body Dressing : Set up;Bed level;Sitting   Lower Body Dressing: Minimal assistance;Moderate assistance;Bed level                 General ADL Comments: Patient received long sitting in bed. Reports 8-8.5/10 pain and declined OOB at this time. Educated patient on role of OT. Discussed use of BSC when foley catheter comes out and that it can be used beside bed or over toilet. Educated patient on having staff with her for all mobility at this time. She verbalized understanding. Patient asking about tub DME. She has a tub with an oval opening. Will need smaller chair. Encouraged patient to discuss this with OT at SNF where she will have rehab. Patient demonstrates that ability to reach B feet from long sitting.  Educated on LB dressing techniques (operated leg first, supportive shoes) and she verbalized understanding. Patient reports she will be getting another medication for her MS that may relieve her pain more. OT will follow.     Vision     Perception     Praxis      Pertinent Vitals/Pain Pain Assessment: 0-10 Pain Score: 8  Pain Location: L knee/thigh/calf, nerve pain in sacral area Pain Descriptors / Indicators: Aching;Discomfort;Sore Pain Intervention(s): Limited activity within patient's tolerance;Monitored during session;Premedicated before session     Hand Dominance     Extremity/Trunk Assessment Upper Extremity Assessment Upper Extremity Assessment: Overall WFL for tasks assessed   Lower Extremity Assessment Lower Extremity Assessment: Defer to PT evaluation       Communication Communication Communication: No difficulties   Cognition Arousal/Alertness: Awake/alert Behavior During Therapy: WFL for tasks assessed/performed Overall Cognitive Status: Within Functional Limits for tasks assessed                     General Comments       Exercises       Shoulder Instructions      Home Living Family/patient expects to be discharged to:: Private residence Living Arrangements: Spouse/significant other Available  Help at Discharge: Family;Available PRN/intermittently Type of Home: House Home Access: Stairs to enter Entrance Stairs-Number of Steps: 5   Home Layout: Two level;Bed/bath upstairs Alternate Level Stairs-Number of Steps: 15   Bathroom Shower/Tub: Tub/shower unit Shower/tub characteristics: Curtain Firefighter: Standard     Home Equipment: Environmental consultant - 2 wheels;Bedside commode          Prior Functioning/Environment Level of Independence: Independent with assistive device(s)             OT Diagnosis: Generalized weakness;Acute pain   OT Problem List: Decreased strength;Decreased range of motion;Decreased activity tolerance;Impaired  balance (sitting and/or standing);Decreased knowledge of use of DME or AE;Pain   OT Treatment/Interventions: Self-care/ADL training;DME and/or AE instruction;Therapeutic activities;Patient/family education    OT Goals(Current goals can be found in the care plan section) Acute Rehab OT Goals Patient Stated Goal: to go to rehab  OT Goal Formulation: With patient Time For Goal Achievement: 11/20/15 Potential to Achieve Goals: Good ADL Goals Pt Will Perform Lower Body Bathing: with supervision;sit to/from stand Pt Will Perform Lower Body Dressing: with supervision;sit to/from stand Pt Will Transfer to Toilet: with supervision;bedside commode Pt Will Perform Toileting - Clothing Manipulation and hygiene: with supervision;sit to/from stand Pt Will Perform Tub/Shower Transfer: with min assist;Tub transfer;shower seat;tub bench  OT Frequency: Min 2X/week   Barriers to D/C:            Co-evaluation              End of Session Equipment Utilized During Treatment: Left knee immobilizer Nurse Communication: Mobility status  Activity Tolerance: Patient limited by pain Patient left: in bed;with call bell/phone within reach;with family/visitor present   Time: 1005-1020 OT Time Calculation (min): 15 min Charges:  OT General Charges $OT Visit: 1 Procedure OT Evaluation $Initial OT Evaluation Tier I: 1 Procedure G-Codes:    Fe Okubo A Nov 28, 2015, 10:37 AM

## 2015-11-06 NOTE — Discharge Summary (Signed)
Physician Discharge Summary   Patient ID: [name] MRN: [id number] DOB/AGE: [birth year] 54 y.o.  Admit date: 11/05/2015 Discharge date: Anticipated 11/08/15  Primary Diagnosis: Left knee primary osteoarthritis  Admission Diagnoses:  Past Medical History  Diagnosis Date  . MVP (mitral valve prolapse) HX HEART RACING  YRS AGO    ASYMPTOMATIC SINCE THEN  . Weakness of right leg SECONDARY TO MS  . History of concussion YOUNG ADULT--  NO RESIDUAL  . Headache(784.0)   . Arthritis   . Bone spur     cervical bone spurs  . MS (multiple sclerosis) ([place])   . 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 Movement disorder   . Vision abnormalities   . Dysrhythmia     hx A-FIB  . IBS (irritable bowel syndrome)   . Bladder incontinence     wears a pad  . ADD (attention deficit disorder)   . Anxiety   . Chronic fatigue    Discharge Diagnoses:   Active Problems:   Left knee DJD  Estimated body mass index is 26.79 kg/(m^2) as calculated from the following:   Height as of this encounter: '5\' 5"'  (1.651 m).   Weight as of this encounter: 73.029 kg (161 lb).  Procedure:  Procedure(s) (LRB): LEFT TOTAL KNEE ARTHROPLASTY (Left)   Consults: None  HPI: see H&P Laboratory Data: Admission on 11/05/2015  Component Date Value Ref Range Status  . aPTT 11/05/2015 40* 24 - 37 seconds Final   Comment:        IF BASELINE aPTT IS ELEVATED, SUGGEST PATIENT RISK ASSESSMENT BE USED TO DETERMINE APPROPRIATE ANTICOAGULANT THERAPY.   . WBC 11/06/2015 7.3  4.0 - 10.5 K/uL Final  . RBC 11/06/2015 3.71* 3.87 - 5.11 MIL/uL Final  . Hemoglobin 11/06/2015 11.3* 12.0 - 15.0 g/dL Final  . HCT 11/06/2015 34.2* 36.0 - 46.0 % Final  . MCV 11/06/2015 92.2  78.0 - 100.0 fL Final  . MCH 11/06/2015 30.5  26.0 - 34.0 pg Final  . MCHC 11/06/2015 33.0  30.0 - 36.0 g/dL Final  . RDW 11/06/2015 13.2  11.5 - 15.5 % Final  . Platelets 11/06/2015 199  150 - 400 K/uL Final  . Sodium 11/06/2015 133* 135 -  145 mmol/L Final  . Potassium 11/06/2015 4.0  3.5 - 5.1 mmol/L Final  . Chloride 11/06/2015 98* 101 - 111 mmol/L Final  . CO2 11/06/2015 31  22 - 32 mmol/L Final  . Glucose, Bld 11/06/2015 149* 65 - 99 mg/dL Final  . BUN 11/06/2015 20  6 - 20 mg/dL Final  . Creatinine, Ser 11/06/2015 0.94  0.44 - 1.00 mg/dL Final  . Calcium 11/06/2015 8.1* 8.9 - 10.3 mg/dL Final  . GFR calc non Af Amer 11/06/2015 >60  >60 mL/min Final  . GFR calc Af Amer 11/06/2015 >60  >60 mL/min Final   Comment: (NOTE) The eGFR has been calculated using the CKD EPI equation. This calculation has not been validated in all clinical situations. eGFR's persistently <60 mL/min signify possible Chronic Kidney Disease.   Georgiann Hahn gap 11/06/2015 4* 5 - 15 Final  Hospital Outpatient Visit on 10/27/2015  Component Date Value Ref Range Status  . MRSA, PCR 10/27/2015 NEGATIVE  NEGATIVE Final  . Staphylococcus aureus 10/27/2015 NEGATIVE  NEGATIVE Final   Comment:        The Xpert SA Assay (FDA approved for NASAL specimens in patients over 56 years of age), is one component of a comprehensive surveillance  program.  Test performance has been validated by [facility] for patients greater than or equal to 56 year old. It is not intended to diagnose infection nor to guide or monitor treatment.   . Sodium 10/27/2015 140  135 - 145 mmol/L Final  . Potassium 10/27/2015 4.5  3.5 - 5.1 mmol/L Final  . Chloride 10/27/2015 101  101 - 111 mmol/L Final  . CO2 10/27/2015 32  22 - 32 mmol/L Final  . Glucose, Bld 10/27/2015 107* 65 - 99 mg/dL Final  . BUN 10/27/2015 16  6 - 20 mg/dL Final  . Creatinine, Ser 10/27/2015 0.79  0.44 - 1.00 mg/dL Final  . Calcium 10/27/2015 9.7  8.9 - 10.3 mg/dL Final  . GFR calc non Af Amer 10/27/2015 >60  >60 mL/min Final  . GFR calc Af Amer 10/27/2015 >60  >60 mL/min Final   Comment: (NOTE) The eGFR has been calculated using the CKD EPI equation. This calculation has not been validated in all clinical  situations. eGFR's persistently <60 mL/min signify possible Chronic Kidney Disease.   . Anion gap 10/27/2015 7  5 - 15 Final  . WBC 10/27/2015 2.4* 4.0 - 10.5 K/uL Final  . RBC 10/27/2015 4.79  3.87 - 5.11 MIL/uL Final  . Hemoglobin 10/27/2015 14.6  12.0 - 15.0 g/dL Final  . HCT 10/27/2015 44.1  36.0 - 46.0 % Final  . MCV 10/27/2015 92.1  78.0 - 100.0 fL Final  . MCH 10/27/2015 30.5  26.0 - 34.0 pg Final  . MCHC 10/27/2015 33.1  30.0 - 36.0 g/dL Final  . RDW 10/27/2015 13.1  11.5 - 15.5 % Final  . Platelets 10/27/2015 256  150 - 400 K/uL Final  . Prothrombin Time 10/27/2015 13.5  11.6 - 15.2 seconds Final  . INR 10/27/2015 1.01  0.00 - 1.49 Final  . Color, Urine 10/27/2015 YELLOW  YELLOW Final  . APPearance 10/27/2015 CLEAR  CLEAR Final  . Specific Gravity, Urine 10/27/2015 1.020  1.005 - 1.030 Final  . pH 10/27/2015 6.0  5.0 - 8.0 Final  . Glucose, UA 10/27/2015 NEGATIVE  NEGATIVE mg/dL Final  . Hgb urine dipstick 10/27/2015 NEGATIVE  NEGATIVE Final  . Bilirubin Urine 10/27/2015 NEGATIVE  NEGATIVE Final  . Ketones, ur 10/27/2015 NEGATIVE  NEGATIVE mg/dL Final  . Protein, ur 10/27/2015 NEGATIVE  NEGATIVE mg/dL Final  . Nitrite 10/27/2015 NEGATIVE  NEGATIVE Final  . Leukocytes, UA 10/27/2015 NEGATIVE  NEGATIVE Final   MICROSCOPIC NOT DONE ON URINES WITH NEGATIVE PROTEIN, BLOOD, LEUKOCYTES, NITRITE, OR GLUCOSE <1000 mg/dL.  . ABO/RH(D) 10/27/2015 A POS   Final  . Antibody Screen 10/27/2015 NEG   Final  . Sample Expiration 10/27/2015 11/08/2015   Final  . Extend sample reason 10/27/2015 NO TRANSFUSIONS OR PREGNANCY IN THE PAST 3 MONTHS   Final  . ABO/RH(D) 10/27/2015 A POS   Final  Lab on 09/24/2015  Component Date Value Ref Range Status  . WBC 09/24/2015 3.5* 4.0 - 10.5 K/uL Final  . RBC 09/24/2015 4.61  3.87 - 5.11 MIL/uL Final  . Hemoglobin 09/24/2015 13.6  12.0 - 15.0 g/dL Final  . HCT 09/24/2015 40.3  36.0 - 46.0 % Final  . MCV 09/24/2015 87.4  78.0 - 100.0 fL Final  . MCH  09/24/2015 29.5  26.0 - 34.0 pg Final  . MCHC 09/24/2015 33.7  30.0 - 36.0 g/dL Final  . RDW 09/24/2015 14.0  11.5 - 15.5 % Final  . Platelets 09/24/2015 255  150 - 400 K/uL Final  .  MPV 09/24/2015 9.9  8.6 - 12.4 fL Final  . Neutrophils Relative % 09/24/2015 67  43 - 77 % Final  . Neutro Abs 09/24/2015 2.3  1.7 - 7.7 K/uL Final  . Lymphocytes Relative 09/24/2015 23  12 - 46 % Final  . Lymphs Abs 09/24/2015 0.8  0.7 - 4.0 K/uL Final  . Monocytes Relative 09/24/2015 9  3 - 12 % Final  . Monocytes Absolute 09/24/2015 0.3  0.1 - 1.0 K/uL Final  . Eosinophils Relative 09/24/2015 1  0 - 5 % Final  . Eosinophils Absolute 09/24/2015 0.0  0.0 - 0.7 K/uL Final  . Basophils Relative 09/24/2015 0  0 - 1 % Final  . Basophils Absolute 09/24/2015 0.0  0.0 - 0.1 K/uL Final  . Smear Review 09/24/2015 Criteria for review not met   Final  . Sodium 09/24/2015 140  135 - 146 mmol/L Final  . Potassium 09/24/2015 4.2  3.5 - 5.3 mmol/L Final  . Chloride 09/24/2015 102  98 - 110 mmol/L Final  . CO2 09/24/2015 27  20 - 31 mmol/L Final  . Glucose, Bld 09/24/2015 82  65 - 99 mg/dL Final  . BUN 09/24/2015 17  7 - 25 mg/dL Final  . Creat 09/24/2015 0.65  0.50 - 1.05 mg/dL Final  . Total Bilirubin 09/24/2015 0.6  0.2 - 1.2 mg/dL Final  . Alkaline Phosphatase 09/24/2015 69  33 - 130 U/L Final  . AST 09/24/2015 19  10 - 35 U/L Final  . ALT 09/24/2015 14  6 - 29 U/L Final  . Total Protein 09/24/2015 6.9  6.1 - 8.1 g/dL Final  . Albumin 09/24/2015 4.4  3.6 - 5.1 g/dL Final  . Calcium 09/24/2015 9.1  8.6 - 10.4 mg/dL Final  . TSH 09/24/2015 0.943  0.350 - 4.500 uIU/mL Final     X-Rays:Dg Knee 1-2 Views Left  10/27/2015  CLINICAL DATA:  Preop for left total knee replacement EXAM: LEFT KNEE - 1-2 VIEW COMPARISON:  MR left knee of 02/24/2006 FINDINGS: There is somewhat age advanced tricompartmental degenerative joint disease of the left knee. There is spurring and sclerosis involving all 3 compartments. No fracture is  seen. No joint effusion is noted. IMPRESSION: Age advanced tricompartmental degenerative joint disease of the left knee. Electronically Signed   By: Ivar Drape M.D.   On: 10/27/2015 10:01   Dg Knee Left Port  11/05/2015  CLINICAL DATA:  Total knee replacement EXAM: PORTABLE LEFT KNEE - 1-2 VIEW COMPARISON:  10/27/2015 FINDINGS: Left knee replacement satisfactory position alignment. No fracture or immediate complication. IMPRESSION: Satisfactory left knee replacement. Electronically Signed   By: Franchot Gallo M.D.   On: 11/05/2015 10:35    EKG: Orders placed or performed during the hospital encounter of 12/28/14  . ED EKG (<79mns upon arrival to the ED)  . ED EKG (<136ms upon arrival to the ED)  . EKG 12-Lead  . EKG 12-Lead  . EKG  . EKG 12-Lead  . EKG 12-Lead     Hospital Course: Lynn STANGELANDs [facility] [age].[facility]. [facility] and underwent Procedure(s): LEFT TOTAL KNEE ARTHROPLASTY.  Patient tolerated the procedure well and was later transferred to the recovery room and then to the orthopaedic floor for postoperative care.  They were given PO and IV analgesics for pain control following their surgery.  They were given 24 hours of postoperative antibiotics of  Anti-infectives  Start     Dose/Rate Route Frequency Ordered Stop   11/05/15 1400  ceFAZolin (ANCEF) IVPB 2 g/50 mL premix     2 g 100 mL/hr over 30 Minutes Intravenous Every 6 hours 11/05/15 1136 11/06/15 0418   11/05/15 0946  polymyxin B 500,000 Units, bacitracin 50,000 Units in sodium chloride irrigation 0.9 % 500 mL irrigation  Status:  Discontinued       As needed 11/05/15 0947 11/05/15 1010   11/05/15 0600  ceFAZolin (ANCEF) IVPB 2 g/50 mL premix     2 g 100 mL/hr over 30 Minutes Intravenous On call to O.R. 11/05/15 1610 11/05/15 0758     and started on DVT prophylaxis in the form of Xarelto, TED hose and SCDs.   PT and OT were ordered for  total joint protocol.  Discharge planning consulted to help with postop disposition and equipment needs.  Patient had a fair night on the evening of surgery.  They started to get up OOB with therapy on day one.  She will continue to work with therapy into day two. By day three, anticipate the patient will have progressed with therapy and will be meeting their goals.   Diet: Regular diet Activity:WBAT Follow-up:in 10-14 days Disposition - Rehab- [facility] Discharged Condition: good      Medication List    STOP taking these medications        HYDROcodone-acetaminophen 7.5-325 MG tablet  Commonly known as:  [place] these medications        ALPRAZolam 0.5 MG tablet  Commonly known as:  XANAX  Take 1 tablet (0.5 mg total) by mouth 3 (three) times daily as needed for anxiety or sleep.     CALCIUM MAGNESIUM PO  Take 4 capsules by mouth at bedtime.     Dimethyl Fumarate 240 MG Cpdr  Commonly known as:  TECFIDERA  Take 1 capsule (240 mg total) by mouth 2 (two) times daily.     docusate sodium 100 MG capsule  Commonly known as:  COLACE  Take 1 capsule (100 mg total) by mouth 2 (two) times daily as needed for mild constipation.     gabapentin 600 MG tablet  Commonly known as:  NEURONTIN  Take 1 tablet (600 mg total) by mouth 4 (four) times daily.     hyoscyamine 0.125 MG SL tablet  Commonly known as:  LEVSIN SL  Place 1 tablet (0.125 mg total) under the tongue every 4 (four) hours as needed.     methylphenidate 20 MG tablet  Commonly known as:  RITALIN  40 mg in the morning and 20 mg with lunch.     metoprolol 50 MG tablet  Commonly known as:  LOPRESSOR  Take 0.5 tablets (25 mg total) by mouth 2 (two) times daily as needed (racing heartbeat).     oxyCODONE-acetaminophen 10-325 MG tablet  Commonly known as:  PERCOCET  Take 1 tablet by mouth every 4 (four) hours as needed for pain.     rivaroxaban 10 MG Tabs tablet  Commonly known as:  XARELTO  Take 1 tablet  (10 mg total) by mouth daily.           Follow-up Information    Follow up with BEANE,JEFFREY C, MD In 2 weeks.   Specialty:  Orthopedic Surgery   Why:  For suture removal   Contact information:   [address] [place] [address] [phone]       Signed: Ellison Hughs  Phoebe Sharps, PA-C Orthopaedic Surgery 11/06/2015, 9:03 AM

## 2015-11-06 NOTE — Clinical Social Work Note (Signed)
Clinical Social Work Assessment  Patient Details  Name: [name] MRN: [id number] Date of Birth: 07-Feb-1961  Date of referral:  11/06/15               Reason for consult:  Facility Placement, Discharge Planning                Permission sought to share information with:  Chartered certified accountant granted to share information::  Yes, Verbal Permission Granted  Name::        Agency::     Relationship::     Contact Information:     Housing/Transportation Living arrangements for the past 2 months:  Single Family Home Source of Information:  Patient Patient Interpreter Needed:  None Criminal Activity/Legal Involvement Pertinent to Current Situation/Hospitalization:  No - Comment as needed Significant Relationships:  Spouse Lives with:  Spouse Do you feel safe going back to the place where you live?   (ST Rehab needed.) Need for family participation in patient care:  No (Coment)  Care giving concerns: Pt's care cannot be managed at home following hospital d/c.   Social Worker assessment / plan:  Pt hospitalized on 11/05/15 for a pre planned Left Total Knee Arthroplasty. CSW met with pt / family to assist with d/c planning. Pt reports that she has made prior arrangements to have ST Rehab at [facility] at d/c. CSW has contacted SNF and is waiting for confirmation of d/c plan. Clinical info has been sent to SNF for review and PT recommends ST Rehab. CSW will continue to follow to assist with d/c planning to SNF.  Employment status:  Disabled (Comment on whether or not currently receiving Disability) Insurance information:  Managed Care PT Recommendations:  [place] / Referral to community resources:  [place]  Patient/Family's Response to care:  Pt / family feel ST Rehab is needed prior to returning home.  Patient/Family's Understanding of and Emotional Response to Diagnosis, Current Treatment, and Prognosis:  Pt /family  are aware of pt's medical status. Pt is motivated to work with therapy and is looking forward to having rehab at [facility].   Emotional Assessment Appearance:  Appears stated age Attitude/Demeanor/Rapport:  Other (cooperative) Affect (typically observed):  Calm, Pleasant Orientation:  Oriented to Self, Oriented to Place, Oriented to  Time, Oriented to Situation Alcohol / Substance use:  Not Applicable Psych involvement (Current and /or in the community):  No (Comment)  Discharge Needs  Concerns to be addressed:  Discharge Planning Concerns Readmission within the last 30 days:  No Current discharge risk:  None Barriers to Discharge:  No Barriers Identified   Luretha Rued, [place] 11/06/2015, 10:24 AM

## 2015-11-07 LAB — CBC
HCT: 32.1 % — ABNORMAL LOW (ref 36.0–46.0)
Hemoglobin: 10.7 g/dL — ABNORMAL LOW (ref 12.0–15.0)
MCH: 30.4 pg (ref 26.0–34.0)
MCHC: 33.3 g/dL (ref 30.0–36.0)
MCV: 91.2 fL (ref 78.0–100.0)
Platelets: 194 10*3/uL (ref 150–400)
RBC: 3.52 MIL/uL — ABNORMAL LOW (ref 3.87–5.11)
RDW: 13.3 % (ref 11.5–15.5)
WBC: 7.5 10*3/uL (ref 4.0–10.5)

## 2015-11-07 NOTE — Care Management Note (Signed)
Case Management Note  Patient Details  Name: [name] MRN: [id number] Date of Birth: [birth year]  Subjective/Objective:     LEFT TOTAL KNEE ARTHROPLASTY                Action/Plan: Scheduled dc to SNF. CSW following for SNF placement.   Expected Discharge Date:  11/07/2015              Expected Discharge Plan:  Skilled Nursing Facility  In-House Referral:  Clinical Social Work  Discharge planning Services  CM Consult  Status of Service:  Completed, signed off  Medicare Important Message Given:    Date Medicare IM Given:    Medicare IM give by:    Date Additional Medicare IM Given:    Additional Medicare Important Message give by:     If discussed at Long Length of Stay Meetings, dates discussed:    Additional Comments:  Elliot Cousin, RN 11/07/2015, 11:35 AM

## 2015-11-07 NOTE — Progress Notes (Signed)
Patient continues to receive multiple-drug therapy for post-operative pain management. Pain is localizable to the operative left lower extremity. Patient had recurrent episodes of  high-grade fever,  temperature peaking maximally at 101.8 F. Febrile episodes were treated with PRN Acetaminophen dosage. There was evidence of gradual temperature reduction after PRN anti-pyretic treatment.  Patient was encouraged routine use of the Incentive Spirometer device along with deep breathing exercises. Patient was instructed to use the device every  hour while awake at 10 breaths per session and coached to optimal performance. Subsequently, patient demonstrated proficiency in the use of the device.

## 2015-11-07 NOTE — Progress Notes (Signed)
Physical Therapy Treatment Patient Details Name: [name] MRN: [id number] DOB: 08/29/1961 Today's Date: 11/07/2015    History of Present Illness DJD left knee s/p L TKA; history of multiple sclerosis    PT Comments    POD # 2 am session Applied KI and instructed on use for amb. Assisted OOB to amb in hallway.  Returned to bed to perform TKR TE's followed by ICE.  Pt required increased time for rest breaks to decrease pain and anxiety level.    Follow Up Recommendations  SNF  [facility]   Equipment Recommendations  None recommended by PT    Recommendations for Other Services       Precautions / Restrictions Precautions Precautions: Fall Required Braces or Orthoses: Knee Immobilizer - Left Knee Immobilizer - Left: Discontinue once straight leg raise with < 10 degree lag Restrictions Weight Bearing Restrictions: No LLE Weight Bearing: Weight bearing as tolerated    Mobility  Bed Mobility Overal bed mobility: Needs Assistance Bed Mobility: Supine to Sit;Sit to Supine     Supine to sit: Min assist Sit to supine: Min assist   General bed mobility comments: cues for sequence and use of R LE to self assist  Transfers Overall transfer level: Needs assistance Equipment used: Rolling walker (2 wheeled) Transfers: Sit to/from Stand Sit to Stand: Min assist;Mod assist         General transfer comment: cues for LE management and use of UEs to self assist  Ambulation/Gait Ambulation/Gait assistance: Min assist Ambulation Distance (Feet): 75 Feet Assistive device: Rolling walker (2 wheeled) Gait Pattern/deviations: Step-to pattern;Trunk flexed;Decreased stance time - left Gait velocity: decr   General Gait Details: cues for sequence, posture, stride length and position from [employer]            Wheelchair Mobility    Modified Rankin (Stroke Patients Only)       Balance                                    Cognition  Arousal/Alertness: Awake/alert Behavior During Therapy: WFL for tasks assessed/performed Overall Cognitive Status: Within Functional Limits for tasks assessed                      Exercises   Total Knee Replacement TE's 10 reps B LE ankle pumps 10 reps towel squeezes 10 reps knee presses 10 reps heel slides  10 reps SAQ's 10 reps SLR's 10 reps ABD Followed by ICE     General Comments        Pertinent Vitals/Pain Pain Assessment: 0-10 Pain Score: 7  Pain Location: L knee Pain Descriptors / Indicators: Aching;Sore Pain Intervention(s): Monitored during session;Premedicated before session;Repositioned;Ice applied    Home Living                      Prior Function            PT Goals (current goals can now be found in the care plan section) Progress towards PT goals: Progressing toward goals    Frequency       PT Plan Current plan remains appropriate    Co-evaluation             End of Session Equipment Utilized During Treatment: Gait belt;Left knee immobilizer Activity Tolerance: Patient limited by fatigue;Patient limited by pain Patient left: in bed;with call bell/phone within reach;with  family/visitor present     Time: 1135-1225 PT Time Calculation (min) (ACUTE ONLY): 50 min  Charges:  $Gait Training: 8-22 mins $Therapeutic Exercise: 8-22 mins $Therapeutic Activity: 8-22 mins                    G Codes:      Felecia Shelling  PTA [facility]  Acute  Rehab Pager      [phone]

## 2015-11-07 NOTE — Clinical Social Work Placement (Signed)
   CLINICAL SOCIAL WORK PLACEMENT  NOTE  Date:  11/07/2015  Patient Details  Name: [name] MRN: [id number] Date of Birth: 04-22-1961  Clinical Social Work is seeking post-discharge placement for this patient at the Skilled  Nursing Facility level of care (*CSW will initial, date and re-position this form in  chart as items are completed):  No   Patient/family provided with [facility] Clinical Social Work Department's list of facilities offering this level of care within the geographic area requested by the patient (or if unable, by the patient's family).  Yes   Patient/family informed of their freedom to choose among providers that offer the needed level of care, that participate in Medicare, Medicaid or managed care program needed by the patient, have an available bed and are willing to accept the patient.  No   Patient/family informed of [facility]'s ownership interest in [facility] and [facility], as well as of the fact that they are under no obligation to receive care at these facilities.  PASRR submitted to EDS on 11/06/15     PASRR number received on 11/06/15     Existing PASRR number confirmed on       FL2 transmitted to all facilities in geographic area requested by pt/family on 11/06/15     FL2 transmitted to all facilities within larger geographic area on       Patient informed that his/her managed care company has contracts with or will negotiate with certain facilities, including the following:            Patient/family informed of bed offers received.  Patient chooses bed at    [facility]   Physician recommends and patient chooses bed at      Patient to be transferred to  [facility] on  .November 07, 2015  Patient to be transferred to facility by   family/spouse/douglas    Patient family notified on  November 07, 2015 of transfer.  Name of family member notified:   douglas spouse     PHYSICIAN       Additional Comment:     _______________________________________________ Annetta Maw, LCSW 11/07/2015, 4:06 PM

## 2015-11-07 NOTE — Progress Notes (Addendum)
Subjective: 2 Days Post-Op Procedure(s) (LRB): LEFT TOTAL KNEE ARTHROPLASTY (Left)  Patient reports pain as mild to moderate.  Reports fever near 102 F, but states that tylenol relieves.  Tolerating POs well.  Admits to BM.  States that she has successfully been up with therapy.  Objective:   VITALS:  Temp:  [97.5 F (36.4 C)-101.8 F (38.8 C)] 99.8 F (37.7 C) (12/03 0557) Pulse Rate:  [88-106] 102 (12/03 0507) Resp:  [16] 16 (12/03 0507) BP: (132-139)/(68-83) 132/70 mmHg (12/03 0507) SpO2:  [93 %-100 %] 99 % (12/03 0507)  General: WDWN patient in NAD. Psych:  Appropriate mood and affect. Neuro:  A&O x 3, Moving all extremities, sensation intact to light touch HEENT:  EOMs intact Chest:  Even non-labored respirations Skin:  Dressing C/D/I, no rashes or lesions Extremities: warm/dry, mild edema, no visible erythmea or echymosis.  No lymphadenopathy. Pulses: Popliteus 2+ MSK:  ROM: TKE - 5-10 degrees, full ankle ROM, MMT: patient can perform quad set, (-) Homan's    LABS  Recent Labs  11/06/15 0504 11/07/15 0456  HGB 11.3* 10.7*  WBC 7.3 7.5  PLT 199 194    Recent Labs  11/06/15 0504  NA 133*  K 4.0  CL 98*  CO2 31  BUN 20  CREATININE 0.94  GLUCOSE 149*   No results for input(s): LABPT, INR in the last 72 hours.   Assessment/Plan: 2 Days Post-Op Procedure(s) (LRB): LEFT TOTAL KNEE ARTHROPLASTY (Left)  Up with therapy D/C IV fluids  Reassured patient that po fever is not uncommon.  The fact that tylenol abates the fever is reassuring. Plan for D/C to SNF when bed available.  Orders complete and prescriptions are on chart. WBAT LLE  Alfredo Martinez, PA-C, ATC [employer] Office:  [phone]

## 2015-11-07 NOTE — Progress Notes (Signed)
Report Called to Lawana Pai, Charity fundraiser at [facility]. Patient being discharged via private vehicle with family to [facility]. Paperwork and prescriptions given to patient.

## 2015-11-09 ENCOUNTER — Encounter: Payer: Self-pay | Admitting: Adult Health

## 2015-11-10 ENCOUNTER — Other Ambulatory Visit: Payer: Self-pay | Admitting: *Deleted

## 2015-11-10 ENCOUNTER — Telehealth: Payer: Self-pay | Admitting: Internal Medicine

## 2015-11-10 MED ORDER — HYDROCODONE-ACETAMINOPHEN 10-325 MG PO TABS: ORAL_TABLET | ORAL | Status: DC

## 2015-11-10 NOTE — Telephone Encounter (Signed)
Had the knee replacement on 12/1.  Was sent to [facility] with a fever on 12/3 night.  She has Shingles.  She wants to know if you think that she should have a shot of Solumedrol to assist since she has the MS??  States that it takes FOREVER to get any type of assistance over there.  And, wonders if she would feel better and snap back quicker if she had the Solumedrol?    States she is sort of quarantined to her room.

## 2015-11-10 NOTE — Telephone Encounter (Signed)
Would not take Solumedrol with Shingles and MS.

## 2015-11-10 NOTE — Telephone Encounter (Signed)
[facility] 

## 2015-11-11 ENCOUNTER — Encounter: Payer: Self-pay | Admitting: Internal Medicine

## 2015-11-11 ENCOUNTER — Non-Acute Institutional Stay (SKILLED_NURSING_FACILITY): Payer: Commercial Managed Care - HMO | Admitting: Internal Medicine

## 2015-11-11 DIAGNOSIS — I480 Paroxysmal atrial fibrillation: Secondary | ICD-10-CM

## 2015-11-11 DIAGNOSIS — R2681 Unsteadiness on feet: Secondary | ICD-10-CM | POA: Diagnosis not present

## 2015-11-11 DIAGNOSIS — F988 Other specified behavioral and emotional disorders with onset usually occurring in childhood and adolescence: Secondary | ICD-10-CM

## 2015-11-11 DIAGNOSIS — D62 Acute posthemorrhagic anemia: Secondary | ICD-10-CM

## 2015-11-11 DIAGNOSIS — G35 Multiple sclerosis: Secondary | ICD-10-CM | POA: Diagnosis not present

## 2015-11-11 DIAGNOSIS — B029 Zoster without complications: Secondary | ICD-10-CM

## 2015-11-11 DIAGNOSIS — M1712 Unilateral primary osteoarthritis, left knee: Secondary | ICD-10-CM

## 2015-11-11 DIAGNOSIS — F909 Attention-deficit hyperactivity disorder, unspecified type: Secondary | ICD-10-CM | POA: Diagnosis not present

## 2015-11-11 LAB — HEPATIC FUNCTION PANEL
ALT: 48 U/L — AB (ref 7–35)
AST: 29 U/L (ref 13–35)
Alkaline Phosphatase: 77 U/L (ref 25–125)

## 2015-11-11 LAB — CBC AND DIFFERENTIAL
HCT: 32 % — AB (ref 36–46)
Hemoglobin: 10.7 g/dL — AB (ref 12.0–16.0)
Platelets: 343 10*3/uL (ref 150–399)
WBC: 3.7 10^3/mL

## 2015-11-11 LAB — BASIC METABOLIC PANEL
BUN: 15 mg/dL (ref 4–21)
Creatinine: 0.7 mg/dL (ref <=1.1)
Glucose: 95 mg/dL
Potassium: 4.5 mmol/L (ref 3.4–5.3)
Sodium: 140 mmol/L (ref 137–147)

## 2015-11-11 NOTE — Progress Notes (Signed)
Patient ID: [name], female   DOB: January 27, 1961, 54 y.o.   MRN: [id number]     [facility]  PCP: [name], MD  Code Status: Full Code   Allergies  Allergen Reactions  . Ibuprofen Other (See Comments)    CAUSES LYMPHEDEMA  . Nsaids Other (See Comments)    AVOIDS ANY SODIUM CONTAINING MED. -- CAUSES LYMPHEDEMA  . Other Other (See Comments)    Laxatives- causes leg swelling    Chief Complaint  Patient presents with  . New Admit To SNF    New Admission      HPI:  54 y.o. patient is here for short term rehabilitation post hospital admission from 11/05/15-11/08/15 with left knee primary OA. She underwent left total knee arthroplasty. She had venous doppler of LLE and DVT was ruled out. She has developed new shingles and is currently on acyclovir. Her pain is under better control with current pain regimen.   Review of Systems:  Constitutional: Negative for fever, chills.  HENT: Negative for headache, congestion, nasal discharge Eyes: Negative for blurred vision, double vision and discharge.  Respiratory: Negative for cough, shortness of breath and wheezing.   Cardiovascular: Negative for chest pain, palpitations Gastrointestinal: Negative for heartburn, nausea, vomiting, abdominal pain Genitourinary: Negative for dysuria Musculoskeletal: Negative for back pain Skin: Negative for itching, rash.  Neurological: Negative for dizziness Psychiatric/Behavioral: Negative for depression   Past Medical History  Diagnosis Date  . MVP (mitral valve prolapse) HX HEART RACING  YRS AGO    ASYMPTOMATIC SINCE THEN  . Weakness of right leg SECONDARY TO MS  . History of concussion YOUNG ADULT--  NO RESIDUAL  . Headache(784.0)   . Arthritis   . Bone spur     cervical bone spurs  . MS (multiple sclerosis) (HCC)   . 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 Movement disorder   . Vision abnormalities   . Dysrhythmia     hx A-FIB  . IBS (irritable bowel  syndrome)   . Bladder incontinence     wears a pad  . ADD (attention deficit disorder)   . Anxiety   . Chronic fatigue    Past Surgical History  Procedure Laterality Date  . Destruction of anal condyloma  07-07-2005  DR TOTH  . Laparoscopic assisted vaginal hysterectomy  01-15-2002  DR Gerlene Burdock HOLLAND/ DR TIM DAVIS    AND REPAIR VENTRAL HERNIA  . Cesarean section  X3    BILATERAL TUBAL LIGATION W/ LAST ONE  . Knee arthroscopy with lateral menisectomy  10/26/2012    Procedure: KNEE ARTHROSCOPY WITH LATERAL MENISECTOMY;  Surgeon: Javier Docker, MD;  Location: [facility];  Service: Orthopedics;  Laterality: Left;  Left Knee Arthrsocopy with Partial Lateral Menisectomy  . Laser ablation condolamata N/A 04/26/2013    Procedure: LASER ABLATION CONDOLAMATA;  Surgeon: Romie Levee, MD;  Location: [facility];  Service: General;  Laterality: N/A;  . Tonsillectomy    . Hernia repair    . Total knee arthroplasty Left 11/05/2015    Procedure: LEFT TOTAL KNEE ARTHROPLASTY;  Surgeon: Jene Every, MD;  Location: [facility];  Service: Orthopedics;  Laterality: Left;   Social History:   reports that she has never smoked. She has never used smokeless tobacco. She reports that she does not drink alcohol or use illicit drugs.  Family History  Problem Relation Age of Onset  . Diabetes Mother   . Hypertension Mother   .  Stroke Mother   . Bowel Disease Brother   . Diabetes Brother   . Healthy Brother   . Emphysema Father   . Heart attack Father     Medications:   Medication List       This list is accurate as of: 11/11/15 10:53 AM.  Always use your most recent med list.               acyclovir 800 MG tablet  Commonly known as:  ZOVIRAX  Take 800 mg by mouth 5 (five) times daily. X 7 days starting on 11/09/15     ALPRAZolam 0.5 MG tablet  Commonly known as:  XANAX  Take 1 tablet (0.5 mg total) by mouth 3 (three) times daily as needed for anxiety or sleep.      CALCIUM MAGNESIUM PO  Take 4 capsules by mouth at bedtime.     Dimethyl Fumarate 240 MG Cpdr  Commonly known as:  TECFIDERA  Take 1 capsule (240 mg total) by mouth 2 (two) times daily.     docusate sodium 100 MG capsule  Commonly known as:  COLACE  Take 1 capsule (100 mg total) by mouth 2 (two) times daily as needed for mild constipation.     gabapentin 300 MG capsule  Commonly known as:  NEURONTIN  Take 300 mg by mouth daily. At 6 pm     gabapentin 600 MG tablet  Commonly known as:  NEURONTIN  Take 1 tablet (600 mg total) by mouth at bedtime.     hyoscyamine 0.125 MG SL tablet  Commonly known as:  LEVSIN SL  Place 1 tablet (0.125 mg total) under the tongue every 4 (four) hours as needed.     methylphenidate 20 MG tablet  Commonly known as:  RITALIN  40 mg in the morning and 20 mg with lunch.     metoprolol 50 MG tablet  Commonly known as:  LOPRESSOR  Take 0.5 tablets (25 mg total) by mouth 2 (two) times daily as needed (racing heartbeat).     PERCOCET 10-325 MG tablet  Generic drug:  oxyCODONE-acetaminophen  Take 1 tablet by mouth every 4 (four) hours as needed for pain (and scheduled at 6 am, 10 am, 2 pm, 6 pm).     XARELTO 10 MG Tabs tablet  Generic drug:  rivaroxaban  1 by mouth QHS         Physical Exam: Filed Vitals:   11/11/15 1035  BP: 127/70  Pulse: 99  Temp: 99.9 F (37.7 C)  TempSrc: Oral  Resp: 18  SpO2: 94%    General- adult female, in no acute distress Head- normocephalic, atraumatic Nose- no maxillary or frontal sinus tenderness, no nasal discharge Throat- moist mucus membrane  Eyes- no pallor, no icterus, no discharge, normal conjunctiva, normal sclera Neck- no cervical lymphadenopathy Cardiovascular- normal s1,s2, no murmurs, left leg 1+ edema Respiratory- bilateral clear to auscultation, no wheeze, no rhonchi, no crackles, no use of accessory muscles Abdomen- bowel sounds present, soft, non tender Musculoskeletal- able to move all 4  extremities, limited left knee range of motion Neurological- alert and oriented to person, place and time Skin- warm and dry, left knee with aquacel dressing. Shingles lesion on chest wall + Psychiatry- normal mood and affect    Labs reviewed: Basic Metabolic Panel:  Recent Labs  16/10/96 1149 10/27/15 0920 11/06/15 0504  NA 140 140 133*  K 4.2 4.5 4.0  CL 102 101 98*  CO2 27 32 31  GLUCOSE  82 107* 149*  BUN 17 16 20   CREATININE 0.65 0.79 0.94  CALCIUM 9.1 9.7 8.1*   Liver Function Tests:  Recent Labs  12/25/14 1504 07/07/15 1224 09/24/15 1149  AST 21 20 19   ALT 18 18 14   ALKPHOS 81 63 69  BILITOT 0.5 0.4 0.6  PROT 6.7 6.2* 6.9  ALBUMIN 4.3 3.5 4.4    Recent Labs  07/07/15 1224  LIPASE 16*   No results for input(s): AMMONIA in the last 8760 hours. CBC:  Recent Labs  06/23/15 1536 07/07/15 1224 09/24/15 1149 10/27/15 0920 11/06/15 0504 11/07/15 0456  WBC 3.7 3.7* 3.5* 2.4* 7.3 7.5  NEUTROABS 2.2 2.7 2.3  --   --   --   HGB  --  13.5 13.6 14.6 11.3* 10.7*  HCT 41.3 39.9 40.3 44.1 34.2* 32.1*  MCV  --  90.3 87.4 92.1 92.2 91.2  PLT  --  206 255 256 199 194   Cardiac Enzymes:  Recent Labs  12/29/14 1910 12/30/14 0040 12/30/14 0652  TROPONINI <0.03 <0.03 <0.03   BNP: Invalid input(s): POCBNP CBG: No results for input(s): GLUCAP in the last 8760 hours.  Radiological Exams: Dg Knee 1-2 Views Left  10/27/2015  CLINICAL DATA:  Preop for left total knee replacement EXAM: LEFT KNEE - 1-2 VIEW COMPARISON:  MR left knee of 02/24/2006 FINDINGS: There is somewhat age advanced tricompartmental degenerative joint disease of the left knee. There is spurring and sclerosis involving all 3 compartments. No fracture is seen. No joint effusion is noted. IMPRESSION: Age advanced tricompartmental degenerative joint disease of the left knee. Electronically Signed   By: Dwyane Dee M.D.   On: 10/27/2015 10:01    Assessment/Plan  Unsteady gait Post left knee  surgery. Will have patient work with PT/OT as tolerated to regain strength and restore function.  Fall precautions are in place.  Left knee OA S/p left total knee arthroplasty. Will have her work with physical therapy and occupational therapy team to help with gait training and muscle strengthening exercises.fall precautions. Skin care. Encourage to be out of bed. Has f/u with orthopedics. Continue oxycodone-apap 10-325 mg q4h along with prn regimen for pain. Continue xarelto for dvt prophylaxis. Keep legs elevated at rest. Refuses ted hose.   Blood loss anemia Post op, monitor cbc  Shingles Continue acyclovir 800 mg five times a day for a week. Continue lidocaine gel on the lesion to help with pain and continue gabapentin  afib Rate controlled. Continue lopressor 25 mg bid as needed for palpitation. Continue xarelto for anticoagulation  MS Continue tecfidera 240 mg bid and gabapentin  ADD Continue her ritalin and prn xanax.  Goals of care: short term rehabilitation   Labs/tests ordered: cbc with diff, cmp  Family/ staff Communication: reviewed care plan with patient and nursing supervisor    Oneal Grout, MD  [facility] [phone] (Monday-Friday 8 am - 5 pm) [phone] (afterhours)

## 2015-11-11 NOTE — Telephone Encounter (Signed)
Patient notified

## 2015-11-20 ENCOUNTER — Encounter: Payer: Self-pay | Admitting: Adult Health

## 2015-11-23 ENCOUNTER — Telehealth: Payer: Self-pay | Admitting: Neurology

## 2015-11-23 NOTE — Telephone Encounter (Signed)
Patient called to request refill of HYDROcodone-acetaminophen (NORCO) 10-325 MG tablet °

## 2015-11-24 ENCOUNTER — Other Ambulatory Visit: Payer: Self-pay

## 2015-11-24 MED ORDER — HYDROCODONE-ACETAMINOPHEN 7.5-325 MG PO TABS: 2.0000 | ORAL_TABLET | Freq: Three times a day (TID) | ORAL | Status: DC | PRN

## 2015-11-24 NOTE — Telephone Encounter (Signed)
Pt called and states that someone from this office called to talk about her medication request. May call pt at [phone]

## 2015-11-24 NOTE — Telephone Encounter (Signed)
Called patient to let her know hydrocodone rx is ready for pick up.

## 2015-11-24 NOTE — Telephone Encounter (Signed)
Pt called and says that Dr. Jillyn Hidden prescribed her hydrocodone, she was then sent to [facility] place for rehab. They took her rx and at d/c was given percocet but has not had it filled. She says that she normally will take hydrocodone 7.5/325, which is what she is requesting for a refill. Please call pt [phone]- Home phone. She requested that a call be made to her home number.

## 2015-11-24 NOTE — Telephone Encounter (Signed)
Ok to fill hydrocodone as before

## 2015-11-24 NOTE — Telephone Encounter (Signed)
I called back on the number provided.  Got no answer.  VM has not been set up, unable to leave message.   If patient calls back, please verify Hydrocodone info.  Message says she is requesting 10/325mg  tabs.  We have previously been prescribing 7.5/325mg  two every 8 hours #180, last written 11/21.  Chart shows 10/325mg  one qid #120 was written by Abbey Chatters, NP on 12/06.   I have attempted to verify this info with [facility], however, they state the patient is not bringing her Hydrocodone Rx's to them to fill.

## 2015-11-25 ENCOUNTER — Other Ambulatory Visit: Payer: Self-pay | Admitting: Neurology

## 2015-11-25 ENCOUNTER — Ambulatory Visit (INDEPENDENT_AMBULATORY_CARE_PROVIDER_SITE_OTHER): Payer: Commercial Managed Care - HMO

## 2015-11-25 DIAGNOSIS — G35 Multiple sclerosis: Secondary | ICD-10-CM

## 2015-11-25 DIAGNOSIS — R269 Unspecified abnormalities of gait and mobility: Secondary | ICD-10-CM | POA: Diagnosis not present

## 2015-11-26 ENCOUNTER — Ambulatory Visit (INDEPENDENT_AMBULATORY_CARE_PROVIDER_SITE_OTHER): Payer: Commercial Managed Care - HMO | Admitting: Internal Medicine

## 2015-11-26 ENCOUNTER — Encounter: Payer: Self-pay | Admitting: Internal Medicine

## 2015-11-26 ENCOUNTER — Telehealth: Payer: Self-pay | Admitting: Internal Medicine

## 2015-11-26 VITALS — BP 120/84 | HR 80 | Temp 97.7°F | Resp 20 | Wt 158.0 lb

## 2015-11-26 DIAGNOSIS — G35 Multiple sclerosis: Secondary | ICD-10-CM

## 2015-11-26 DIAGNOSIS — Z96652 Presence of left artificial knee joint: Secondary | ICD-10-CM | POA: Diagnosis not present

## 2015-11-26 DIAGNOSIS — B029 Zoster without complications: Secondary | ICD-10-CM | POA: Diagnosis not present

## 2015-11-26 DIAGNOSIS — B0229 Other postherpetic nervous system involvement: Secondary | ICD-10-CM

## 2015-11-26 LAB — CBC WITH DIFFERENTIAL/PLATELET
Basophils Absolute: 0 10*3/uL (ref 0.0–0.1)
Basophils Relative: 1 % (ref 0–1)
Eosinophils Absolute: 0.2 10*3/uL (ref 0.0–0.7)
Eosinophils Relative: 4 % (ref 0–5)
HCT: 38.5 % (ref 36.0–46.0)
Hemoglobin: 12.8 g/dL (ref 12.0–15.0)
Lymphocytes Relative: 30 % (ref 12–46)
Lymphs Abs: 1.3 10*3/uL (ref 0.7–4.0)
MCH: 30.2 pg (ref 26.0–34.0)
MCHC: 33.2 g/dL (ref 30.0–36.0)
MCV: 90.8 fL (ref 78.0–100.0)
MPV: 9.5 fL (ref 8.6–12.4)
Monocytes Absolute: 0.4 10*3/uL (ref 0.1–1.0)
Monocytes Relative: 10 % (ref 3–12)
Neutro Abs: 2.4 10*3/uL (ref 1.7–7.7)
Neutrophils Relative %: 55 % (ref 43–77)
Platelets: 492 10*3/uL — ABNORMAL HIGH (ref 150–400)
RBC: 4.24 MIL/uL (ref 3.87–5.11)
RDW: 14.6 % (ref 11.5–15.5)
WBC: 4.3 10*3/uL (ref 4.0–10.5)

## 2015-11-26 MED ORDER — VALACYCLOVIR HCL 1 G PO TABS: 1000.0000 mg | ORAL_TABLET | Freq: Three times a day (TID) | ORAL | Status: DC

## 2015-11-26 MED ORDER — METHYLPREDNISOLONE ACETATE 80 MG/ML IJ SUSP
80.0000 mg | Freq: Once | INTRAMUSCULAR | Status: AC
  Administered 2015-11-26: 80 mg via INTRAMUSCULAR

## 2015-11-26 MED ORDER — HYDROCODONE-ACETAMINOPHEN 10-325 MG PO TABS: 1.0000 | ORAL_TABLET | Freq: Three times a day (TID) | ORAL | Status: DC | PRN

## 2015-11-26 NOTE — Patient Instructions (Addendum)
Take hydrocodone/APAP 10/325 every 8 hours when necessary pain #30 no refill then go back to 7.5 mg hydrocodone/APAP as previously prescribed Dr. Epimenio Foot. 1000 mg 3 times daily for 7 days Valtrex. Continue Neurontin. Depo-Medrol given today. Call with progress report next week .CBC with diff drawn.

## 2015-11-26 NOTE — Telephone Encounter (Signed)
Needs appt

## 2015-11-26 NOTE — Telephone Encounter (Signed)
Patient was notified and offered appt for today at 3:45 she is going to call me right back to let me know if she can make it as she had a home visit with physical therapy coming.

## 2015-11-26 NOTE — Telephone Encounter (Signed)
States she has come down with shingles while in [place].  They put her on Valtrex and she started that on 12/5 x 7 days.  She's having burning pain.  Says that it's worse on her chest (that's where it started).  It has dried up there.  Has dried up fairly well on her arm.  At night, states that it seems to be more raised and very painful at night.  She has been taking Neurontin at night and Robaxin she has been using as well.  States that she feels her MS is flared up from it.  She was discharged from Rehab on 12/17.    Do you want to see her?  Or, do you think she needs to take Valtrex longer?  Please advise.    [facility] [place]  Phone #  [phone]

## 2015-11-27 NOTE — Telephone Encounter (Signed)
Pt seen

## 2015-12-01 ENCOUNTER — Ambulatory Visit: Payer: Self-pay | Admitting: Internal Medicine

## 2015-12-02 ENCOUNTER — Ambulatory Visit (INDEPENDENT_AMBULATORY_CARE_PROVIDER_SITE_OTHER): Payer: Commercial Managed Care - HMO

## 2015-12-02 DIAGNOSIS — G35 Multiple sclerosis: Secondary | ICD-10-CM | POA: Diagnosis not present

## 2015-12-02 DIAGNOSIS — R269 Unspecified abnormalities of gait and mobility: Secondary | ICD-10-CM | POA: Diagnosis not present

## 2015-12-02 MED ORDER — GADOPENTETATE DIMEGLUMINE 469.01 MG/ML IV SOLN: 15.0000 mL | Freq: Once | INTRAVENOUS | Status: DC | PRN

## 2015-12-03 ENCOUNTER — Telehealth: Payer: Self-pay | Admitting: *Deleted

## 2015-12-03 NOTE — Telephone Encounter (Signed)
LVM to call about results. Gave [facility] phone number and hours.

## 2015-12-03 NOTE — Telephone Encounter (Signed)
-----   Message from Asa Lente, MD sent at 12/02/2015  5:57 PM EST ----- Please let her know that the MRIs of the brain and spine did not show any brand-new changes. We are trying to get her old images from [facility] to check for changes since her last MRI.

## 2015-12-05 NOTE — Progress Notes (Signed)
   Subjective:    Patient ID: [name], female    DOB: 10-05-1961, 54 y.o.   MRN: [id number]  HPI Patient was in rehabilitation recently at [facility] after  Left knee replacement which was done on December 1. Developed rash during that time in rehabilitation consistent with Herpes zoster. Says rehabilitation physician placed her on antiviral medication possibly acyclovir. However patient feels that rash is recurring and she's having neuralgia in left neck arm and shoulder consistent with postherpetic neuralgia. She has a history of multiple sclerosis and is treated with Tecfidera. Is followed by Dr. Epimenio Foot, neurologist. She also had some issues with her pain medication recently. She  thinks rehabilitation facility kept one of her prescriptions for Norco. History of fibromyalgia -type symptoms with multiple sclerosis. Some persistent knee pain status post knee replacement. Is on gabapentin chronically with multiple sclerosis issues.   See physical examination 09/30/2015 for detailed history.  Does not want to try Lyrica for postherpetic neuralgia. Doesn't like the way it makes her feel. She's tried it before for multiple sclerosis.  Review of Systems     Objective:   Physical Exam  She has diffuse macular papular rash with discrete lesions  affecting her left neck and arm which could be recurring herpes zoster. Lesions are not clearly vesicular in nature. However  lesions follow C3-C4 nerve root.   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 Depo-Medrol 80 mg IM given. CBC with differential drawn. Continue Neurontin. Hydrocodone/APAP as directed for pain control. Call with progress report next week.  30 minutes spent with patient and husband

## 2015-12-10 ENCOUNTER — Telehealth: Payer: Self-pay | Admitting: Internal Medicine

## 2015-12-10 NOTE — Telephone Encounter (Signed)
She's on 7.5mg  of Hydrocodone (Norco) and  of Hydrocodone (Norco) that you gave her as well when she was in here.  She has been doing therapy as well as the shingles.  Between the shingles and the therapy, she has been needing the Norco.  She feels that she is going to run out of medication because she is having to go to her 7.5mg  that Dr. Epimenio Foot gave to her.  She will have enough to have 7.5 to last through the 19th or 20th of January, but will be about 2 days short of having it refilled.  She just doesn't like the 7.5mg  because it doesn't work as good as the .  She talked with his nurse today and his nurse advised that he had already given her "a lot of pills" so she apparently is now calling our office because they wanted her to contact her PCP.  She states that the  works better.    Patient states that she also has a Rx for Percocet that she has never filled (when she left the Assisted Living facility).  States that "they" took her prescription for Hydrocodone by Dr. Shelle Iron (when she arrived at the Assisted Living Facility) and "they" couldn't find it when it was time for her to go home.  So "they" gave her a Rx for Percocet.  She hasn't filled that yet.    So, she's having therapy 2-3 days per week.  The following day she is in too much pain to do anything which she states is due to her MS.  So, she feels that she needs another Norco Rx for pain.    Please advise.

## 2015-12-10 NOTE — Telephone Encounter (Signed)
-----   Message from Richard A [name], MD sent at 12/02/2015  5:57 PM EST ----- Please let her know that the MRIs of the brain and spine did not show any brand-new changes. We are trying to get her old images from [facility] to check for changes since her last MRI. 

## 2015-12-10 NOTE — Telephone Encounter (Signed)
I have spoken with [name] this morning, and per RAS, advised that mri brain did not show any brand new lesions.  RAS has requested the last mri she  had done at [facility], and once he receives it and has compared it with her recent mri, I will call her with a new report to confirm no changes.  She verbalized understanding of same/fim

## 2015-12-10 NOTE — Telephone Encounter (Signed)
Refill Norco 10 mg once. Needs to discuss future Rxs with Dr. Epimenio Foot. This is my last time giving her pain meds

## 2015-12-11 MED ORDER — HYDROCODONE-ACETAMINOPHEN 10-325 MG PO TABS: 1.0000 | ORAL_TABLET | Freq: Three times a day (TID) | ORAL | Status: DC | PRN

## 2015-12-11 NOTE — Telephone Encounter (Signed)
Prescription printed patient advised you will not refill it after this time. She will be by today to get prescription.

## 2015-12-23 ENCOUNTER — Other Ambulatory Visit: Payer: Self-pay | Admitting: Neurology

## 2015-12-23 MED ORDER — METHYLPHENIDATE HCL 20 MG PO TABS: ORAL_TABLET | ORAL | Status: DC

## 2015-12-23 MED ORDER — HYDROCODONE-ACETAMINOPHEN 7.5-325 MG PO TABS: 2.0000 | ORAL_TABLET | Freq: Three times a day (TID) | ORAL | Status: DC | PRN

## 2015-12-23 NOTE — Telephone Encounter (Signed)
Pt called requesting refill for methylphenidate (RITALIN) 20 MG tablet and HYDROcodone-acetaminophen (NORCO) 7.5-325 MG tablet . Pt said she has RX for Norco 10-325 but she has not filled. She is wanting to stay with lower dose

## 2015-12-24 ENCOUNTER — Encounter: Payer: Self-pay | Admitting: *Deleted

## 2015-12-24 NOTE — Progress Notes (Signed)
Hydrocodone and Ritalin rx's up front [facility]/fim 

## 2016-01-04 ENCOUNTER — Other Ambulatory Visit: Payer: Self-pay | Admitting: *Deleted

## 2016-01-04 MED ORDER — DIMETHYL FUMARATE 240 MG PO CPDR: 240.0000 mg | DELAYED_RELEASE_CAPSULE | Freq: Two times a day (BID) | ORAL | Status: DC

## 2016-01-04 NOTE — Telephone Encounter (Signed)
Tecfidera r/f via fax ([facility])/fim

## 2016-01-21 ENCOUNTER — Telehealth: Payer: Self-pay | Admitting: Neurology

## 2016-01-21 MED ORDER — HYDROCODONE-ACETAMINOPHEN 7.5-325 MG PO TABS: 2.0000 | ORAL_TABLET | Freq: Three times a day (TID) | ORAL | Status: DC | PRN

## 2016-01-21 MED ORDER — METHYLPHENIDATE HCL 20 MG PO TABS: ORAL_TABLET | ORAL | Status: DC

## 2016-01-21 MED ORDER — TIZANIDINE HCL 4 MG PO TABS: 4.0000 mg | ORAL_TABLET | Freq: Three times a day (TID) | ORAL | Status: DC | PRN

## 2016-01-21 NOTE — Addendum Note (Signed)
Addended by: Candis Schatz I on: 01/21/2016 10:59 AM   Modules accepted: Orders

## 2016-01-21 NOTE — Telephone Encounter (Signed)
Pt needs refill on methylphenidate (RITALIN) 20 MG tablet , HYDROcodone-acetaminophen (NORCO) 7.5-325 MG tablet. Pt also wants to know if taking a muscle relaxer would help her as well. She has an old rx at the house that she is taking. Please call and advise [phone]

## 2016-01-21 NOTE — Telephone Encounter (Signed)
Ritalin and Hydrocodone rx's up front [facility].  Tizanidine escribed to [facility] [place]/fim

## 2016-02-19 ENCOUNTER — Telehealth: Payer: Self-pay | Admitting: Neurology

## 2016-02-19 NOTE — Telephone Encounter (Signed)
Patient called to request refills of methylphenidate (RITALIN) 20 MG tablet and HYDROcodone-acetaminophen (NORCO) 7.5-325 MG tablet, states that she needs these medications for the weekend and that she tried to call yesterday and she couldn't get through.

## 2016-02-19 NOTE — Telephone Encounter (Signed)
I have spoken with [name] and advised RAS is not in the office today--as it is almost 12, she would not be able to get here in time to p/u rx's anyway.  Will get them ready for  her to pick up on Monday./fim

## 2016-02-19 NOTE — Telephone Encounter (Signed)
Pt called back to check on medication refill.

## 2016-02-22 MED ORDER — HYDROCODONE-ACETAMINOPHEN 7.5-325 MG PO TABS: 2.0000 | ORAL_TABLET | Freq: Three times a day (TID) | ORAL | Status: DC | PRN

## 2016-02-22 MED ORDER — METHYLPHENIDATE HCL 20 MG PO TABS: ORAL_TABLET | ORAL | Status: DC

## 2016-02-22 NOTE — Telephone Encounter (Signed)
Ritalin and Hydrocodone rx's up front [facility]/fim 

## 2016-02-22 NOTE — Telephone Encounter (Signed)
Pt. aware rx's are ready this am/fim

## 2016-02-22 NOTE — Telephone Encounter (Signed)
Pt called to check on medication refill. Please call and advise when it is ready  She says she was told that it would be for pick up by 9am. Her husband will be going out of town and is the one who will be picking it up. She does not have a way to come her self.

## 2016-02-25 ENCOUNTER — Ambulatory Visit (INDEPENDENT_AMBULATORY_CARE_PROVIDER_SITE_OTHER): Payer: Commercial Managed Care - HMO | Admitting: Neurology

## 2016-02-25 ENCOUNTER — Encounter: Payer: Self-pay | Admitting: Neurology

## 2016-02-25 VITALS — BP 154/80 | HR 64 | Resp 16 | Ht 65.0 in

## 2016-02-25 DIAGNOSIS — G35 Multiple sclerosis: Secondary | ICD-10-CM

## 2016-02-25 DIAGNOSIS — R3911 Hesitancy of micturition: Secondary | ICD-10-CM | POA: Diagnosis not present

## 2016-02-25 DIAGNOSIS — R5382 Chronic fatigue, unspecified: Secondary | ICD-10-CM

## 2016-02-25 DIAGNOSIS — F909 Attention-deficit hyperactivity disorder, unspecified type: Secondary | ICD-10-CM | POA: Diagnosis not present

## 2016-02-25 DIAGNOSIS — F4323 Adjustment disorder with mixed anxiety and depressed mood: Secondary | ICD-10-CM

## 2016-02-25 DIAGNOSIS — F988 Other specified behavioral and emotional disorders with onset usually occurring in childhood and adolescence: Secondary | ICD-10-CM

## 2016-02-25 DIAGNOSIS — B0229 Other postherpetic nervous system involvement: Secondary | ICD-10-CM

## 2016-02-25 DIAGNOSIS — R269 Unspecified abnormalities of gait and mobility: Secondary | ICD-10-CM | POA: Diagnosis not present

## 2016-02-25 DIAGNOSIS — R208 Other disturbances of skin sensation: Secondary | ICD-10-CM

## 2016-02-25 MED ORDER — METHYLPHENIDATE HCL 20 MG PO TABS: ORAL_TABLET | ORAL | Status: DC

## 2016-02-25 MED ORDER — LAMOTRIGINE 100 MG PO TABS: 100.0000 mg | ORAL_TABLET | Freq: Every day | ORAL | Status: DC

## 2016-02-25 MED ORDER — LAMOTRIGINE 25 MG PO TABS: 25.0000 mg | ORAL_TABLET | Freq: Every day | ORAL | Status: DC

## 2016-02-25 MED ORDER — HYDROCODONE-ACETAMINOPHEN 7.5-325 MG PO TABS: 2.0000 | ORAL_TABLET | Freq: Three times a day (TID) | ORAL | Status: DC | PRN

## 2016-02-25 NOTE — Telephone Encounter (Signed)
[name]/[facility] [phone] called regarding HYDROcodone-acetaminophen (NORCO) 7.5-325 MG tablet prescription, this medication appears to be other providers and pharmacies prescribing this medication. Patient is there now (patient can be heard in the background, questioning pharmacist as to why she is calling our office about this prescription).

## 2016-02-25 NOTE — Telephone Encounter (Signed)
Pt called said the RX for Norco 10mg  #40 was given to her by Dr Shelle Iron for knee replacement in January and she had it filled on Sunday. She said she tried to get through to [facility] on Thursday 5 times and again on Friday but with no success, phone's rang busy. Said she was out of medication over the weekend. She said she is not going to take the 10mg . Sts she does better with 7.5mg  and is requesting it to be filled. If she doesn't answer can call her husband at [phone]

## 2016-02-25 NOTE — Progress Notes (Signed)
[facility]  PATIENT: ADELINE PETITFRERE DOB: [birth year] REASON FOR VISIT: Multiple sclerosis   HISTORICAL  CHIEF COMPLAINT:  Chief Complaint  Patient presents with  . Multiple Sclerosis    Sts. she continues to tolerate Tecfidera well.  Sts. since having shingles, she's had constant burning pain around chest/thoracic region--"like I have a tube top on." /fim    HISTORY OF PRESENT ILLNESS:  Bernedette Auston is a 55 year old woman with multiple sclerosis since 2008.    Since her last visit, she feels cognition is worse.   She has more dysesthetic sensations and spasticity.    She tolerates Tecfidera well and she has not had any major exacerbation while she has been on it.     Gait/Strength/sensation:   She reports poor gait and right foot drop.   This has worsened some this year.      She has right greater than left leg clumsiness. No recent falls.    She uses a cane at times and sometimes uses a wheelchair for shopping.   She has dysesthesias in her feet (left > right like a stocking that is tight and burning more symmetrically) and milder in her hands. Also (see below) dysesthesias in chest.    Pain radiates down her legs at times.   She gets a tight sensation and a poking sensation in the thoracic spine region.   Baclofen caused headache and palpitations.   Tizanidine makes her sleepy so just takes at night.  PHN:   She had shingles at the sternum and down left arm in December.   Since then, she has more pain in the chest region.   Pain is burning.    She takes hydrocodone 6/day for pain.   She takes gabapentin just at bedtime (1200mg ) because daytime doses are not well tolerated.  Shingles occurred at time of left knee surgery  Bladder:   She has bladder dysfunction and has occasional incontinence.  Tamsulosin for hesitancy was poorly tolerated.  She has seen urology.   She wears a pad for occasional incontinence.   Vision:    She has diplopia, especially when she is tired  or with gabapentin.   She notes decreased acuity at night compared to a few years ago.   An early exacerbation was diplopia.    No h/o optic neuritis.   No color asymmetry.   Fatigue/sleep:   She reports physical and cognitive fatigue. She also has difficulties with attention. She feels both physical and cognitive fatigue and her attentional problems improved when she takes Ritalin. She is currently on the Ritalin 20 mg by mouth 3 times a day (usually 40 mg in am and 20 at noon).    S;leep is variable.   Tizanidine and xanax help sleep onset.     Mood/cognition:  She has had severe depression in the past and has a mild depression now. Her pain frustrates her.    She stopped Pristiq.  She has mild anxiety, helped by  Xanax .   She notes a brain fog at times  She has some word finding problems and decreased attention.   Methylphenidate has helped a lot and she tolerates it well.     MS History:  In 2008, she had an MRI of the brain performed which was consistent with multiple sclerosis and she was diagnosed with relapsing remitting MS. He was initially placed on Avonex for therapy. She had some exacerbations with weakness and clumsiness with her gait and she started  to see Dr. Leotis Shames. He placed her on Tysabri she is on Tysabri between 2010 and 2013. 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      REVIEW OF SYSTEMS:  Constitutional: No fevers, chills, sweats, or change in appetite.  She notes fatigue Eyes: No visual changes,eye pain.   She has intermittent diplopia Ear, nose and throat: No hearing loss, ear pain, nasal congestion, sore throat Cardiovascular: No chest pain, palpitations Respiratory:  No shortness of breath at rest or with exertion.   No wheezes GastrointestinaI: No nausea, vomiting, diarrhea, abdominal pain, fecal incontinence Genitourinary:  a sabove Musculoskeletal:  she has both neck pain  and back pain. Also bilat knee pain Integumentary: No rash, pruritus, skin lesions.   She has Raynauds in her hands  Neurological: as above Psychiatric: No depression at this time.  Anxiety doing ok Endocrine: No palpitations, diaphoresis, change in appetite, change in weigh or increased thirst Hematologic/Lymphatic:  No anemia, purpura, petechiae. Allergic/Immunologic: No itchy/runny eyes, nasal congestion, recent allergic reactions, rashes  ALLERGIES: Allergies  Allergen Reactions  . Ibuprofen Other (See Comments)    CAUSES LYMPHEDEMA  . Nsaids Other (See Comments)    AVOIDS ANY SODIUM CONTAINING MED. -- CAUSES LYMPHEDEMA  . Other Other (See Comments)    Laxatives- causes leg swelling    HOME MEDICATIONS: Outpatient Prescriptions Prior to Visit  Medication Sig Dispense Refill  . ALPRAZolam (XANAX) 0.5 MG tablet Take 1 tablet (0.5 mg total) by mouth 3 (three) times daily as needed for anxiety or sleep. 90 tablet 5  . Dimethyl Fumarate (TECFIDERA) 240 MG CPDR Take 1 capsule (240 mg total) by mouth 2 (two) times daily. 180 capsule 3  . gabapentin (NEURONTIN) 600 MG tablet Take 1 tablet (600 mg total) by mouth at bedtime.    Marland Kitchen HYDROcodone-acetaminophen (NORCO) 7.5-325 MG tablet Take 2 tablets by mouth every 8 (eight) hours as needed. 180 tablet 0  . hyoscyamine (LEVSIN SL) 0.125 MG SL tablet Place 1 tablet (0.125 mg total) under the tongue every 4 (four) hours as needed. 90 tablet 5  . methylphenidate (RITALIN) 20 MG tablet 40 mg in the morning and 20 mg with lunch. 90 tablet 0  . metoprolol (LOPRESSOR) 50 MG tablet Take 0.5 tablets (25 mg total) by mouth 2 (two) times daily as needed (racing heartbeat). 30 tablet 0  . tiZANidine (ZANAFLEX) 4 MG tablet Take 1 tablet (4 mg total) by mouth 3 (three) times daily as needed for muscle spasms. 90 tablet 0  . valACYclovir (VALTREX) 1000 MG tablet Take 1 tablet (1,000 mg total) by mouth 3 (three) times daily. 21 tablet 0  . docusate sodium  (COLACE) 100 MG capsule Take 1 capsule (100 mg total) by mouth 2 (two) times daily as needed for mild constipation. (Patient not taking: Reported on 02/25/2016) 20 capsule 1  . aspirin 325 MG tablet Take 325 mg by mouth daily. Reported on 02/25/2016    . Calcium-Magnesium-Vitamin D (CALCIUM MAGNESIUM PO) Take 4 capsules by mouth at bedtime. Reported on 02/25/2016    . gabapentin (NEURONTIN) 300 MG capsule Take 300 mg by mouth daily. Reported on 02/25/2016    . lidocaine (XYLOCAINE) 2 % jelly Apply 1 application topically 2 (two) times daily as needed. Reported on 02/25/2016     Facility-Administered Medications Prior to Visit  Medication Dose Route Frequency Provider Last Rate Last Dose  .  gadopentetate dimeglumine (MAGNEVIST) injection 15 mL  15 mL Intravenous Once PRN Asa Lente, MD        PAST MEDICAL HISTORY: Past Medical History  Diagnosis Date  . MVP (mitral valve prolapse) HX HEART RACING  YRS AGO    ASYMPTOMATIC SINCE THEN  . Weakness of right leg SECONDARY TO MS  . History of concussion YOUNG ADULT--  NO RESIDUAL  . Headache(784.0)   . Arthritis   . Bone spur     cervical bone spurs  . MS (multiple sclerosis) (HCC)   . 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 Movement disorder   . Vision abnormalities   . Dysrhythmia     hx A-FIB  . IBS (irritable bowel syndrome)   . Bladder incontinence     wears a pad  . ADD (attention deficit disorder)   . Anxiety   . Chronic fatigue     PAST SURGICAL HISTORY: Past Surgical History  Procedure Laterality Date  . Destruction of anal condyloma  07-07-2005  DR TOTH  . Laparoscopic assisted vaginal hysterectomy  01-15-2002  DR Gerlene Burdock HOLLAND/ DR TIM DAVIS    AND REPAIR VENTRAL HERNIA  . Cesarean section  X3    BILATERAL TUBAL LIGATION W/ LAST ONE  . Knee arthroscopy with lateral menisectomy  10/26/2012    Procedure: KNEE ARTHROSCOPY WITH LATERAL MENISECTOMY;  Surgeon: Javier Docker, MD;  [facility];  Service: Orthopedics;  Laterality: Left;  Left Knee Arthrsocopy with Partial Lateral Menisectomy  . Laser ablation condolamata N/A 04/26/2013    Procedure: LASER ABLATION CONDOLAMATA;  Surgeon: Romie Levee, MD;  Location: [facility];  Service: General;  Laterality: N/A;  . Tonsillectomy    . Hernia repair    . Total knee arthroplasty Left 11/05/2015    Procedure: LEFT TOTAL KNEE ARTHROPLASTY;  Surgeon: Jene Every, MD;  Location: [facility];  Service: Orthopedics;  Laterality: Left;    FAMILY HISTORY: Family History  Problem Relation Age of Onset  . Diabetes Mother   . Hypertension Mother   . Stroke Mother   . Bowel Disease Brother   . Diabetes Brother   . Healthy Brother   . Emphysema Father   . Heart attack Father     SOCIAL HISTORY:  Social History   Social History  . Marital Status: Married    Spouse Name: N/A  . Number of Children: 3  . Years of Education: N/A   Occupational History  . Not on file.   Social History Main Topics  . Smoking status: Never Smoker   . Smokeless tobacco: Never Used  . Alcohol Use: No  . Drug Use: No  . Sexual Activity: Not on file   Other Topics Concern  . Not on file   Social History Narrative     PHYSICAL EXAM  Filed Vitals:   02/25/16 1106  BP: 154/80  Pulse: 64  Resp: 16  Height: 5\' 5"  (1.651 m)  PF: 161 L/min    There is no weight on file to calculate BMI.   General: The patient is well-developed and well-nourished and in no acute distress   Neurologic Exam  Mental status: The patient is alert and oriented x 3 at the time of the examination. The patient has apparent normal recent and remote memory, with an apparently normal attention span and concentration ability.   Speech is normal.  Cranial nerves: Extraocular movements are full.   Facial  symmetry is present. There is good facial sensation to soft touch bilaterally.   Facial strength is normal.  Trapezius and sternocleidomastoid  strength is normal. No dysarthria is noted.  The tongue is midline, and the patient has symmetric elevation of the soft palate.   Motor:  Muscle bulk is normal and tone is minimally increased in both legs. Strength is  5 / 5 in all 4 extremities.   Sensory: Sensory testing is intact to  soft touch and vibration sensation on all 4 extremities.  Coordination: Cerebellar testing reveals good finger-nose-finger bilaterally.  Gait and station: Station is normal and gait is mildly wid. Tandem gait is wide. Romberg is negative.   Reflexes: Deep tendon reflexes are symmetric and brisk bilaterally.      DIAGNOSTIC DATA (LABS, IMAGING, TESTING) - I reviewed patient records, labs, notes, testing and imaging myself where available.  Lab Results  Component Value Date   WBC 4.3 11/26/2015   HGB 12.8 11/26/2015   HCT 38.5 11/26/2015   MCV 90.8 11/26/2015   PLT 492* 11/26/2015      Component Value Date/Time   NA 140 11/11/2015   NA 133* 11/06/2015 0504   K 4.5 11/11/2015   CL 98* 11/06/2015 0504   CO2 31 11/06/2015 0504   GLUCOSE 149* 11/06/2015 0504   BUN 15 11/11/2015   BUN 20 11/06/2015 0504   CREATININE 0.7 11/11/2015   CREATININE 0.94 11/06/2015 0504   CREATININE 0.65 09/24/2015 1149   CALCIUM 8.1* 11/06/2015 0504   PROT 6.9 09/24/2015 1149   ALBUMIN 4.4 09/24/2015 1149   AST 29 11/11/2015   ALT 48* 11/11/2015   ALKPHOS 77 11/11/2015   BILITOT 0.6 09/24/2015 1149   GFRNONAA >60 11/06/2015 0504   GFRAA >60 11/06/2015 0504   Lab Results  Component Value Date   CHOL 196 12/25/2014   HDL 92 12/25/2014   LDLCALC 91 12/25/2014   TRIG 65 12/25/2014   CHOLHDL 2.1 12/25/2014   Lab Results  Component Value Date   HGBA1C  03/10/2010    5.5 (NOTE) The ADA recommends the following therapeutic goal for glycemic control related to Hgb A1c measurement: Goal of therapy: <6.5 Hgb A1c  Reference: American Diabetes Association: Clinical Practice Recommendations 2010, Diabetes Care,  2010, 33: (Suppl  1).      ASSESSMENT AND PLAN  Multiple sclerosis (HCC)  Abnormality of gait  Chronic fatigue  Urinary hesitancy  Attention deficit disorder  Adjustment disorder with mixed anxiety and depressed mood  Dysesthesia  PHN (postherpetic neuralgia)    1.   Continue Tecfidera.  check CBC with diff to rule out lymphopenia. 2.  Refill ritalin, hydrocodone.   Continue other med's 3.   Remain active, try to exercise 4.   Lamotrigine 25 mg titrate to 100 mg po bid rtc 4-6 months, sooner if problems  40 minutes face-to-face evaluation with greater than one half of the time counseling and coordinating care about her MS and worsening symptoms.  Vernell Back A. Epimenio Foot, MD, PhD 02/25/2016, 11:29 AM Certified in Neurology, Clinical Neurophysiology, Sleep Medicine, Pain Medicine and Neuroimaging  [facility] [address] [place], [place] [address] [phone]

## 2016-02-25 NOTE — Patient Instructions (Signed)
The pharmacy has the prescription of lamotrigine 25 mg. Please take it as follows: For 1 week take 1 pill once a day. The second week, take 1 pill twice a day. The third week, take 1 pill in the morning and 2 at bedtime or evening. The fourth week take 2 pills twice a day.  I have also give you a paper prescription for lamotrigine 100 mg tablets after the first 4 weeks you should take 100 mg twice a day.  Most people tolerate lamotrigine very well. Some will get a rash. If you do get a significant rash stop the medicine immediately and let us know. 

## 2016-02-26 LAB — CBC WITH DIFFERENTIAL/PLATELET
Basophils Absolute: 0 10*3/uL (ref 0.0–0.2)
Basos: 0 %
EOS (ABSOLUTE): 0 10*3/uL (ref 0.0–0.4)
Eos: 1 %
Hematocrit: 43.2 % (ref 34.0–46.6)
Hemoglobin: 14.7 g/dL (ref 11.1–15.9)
Immature Grans (Abs): 0 10*3/uL (ref 0.0–0.1)
Immature Granulocytes: 0 %
Lymphocytes Absolute: 1.2 10*3/uL (ref 0.7–3.1)
Lymphs: 30 %
MCH: 30.2 pg (ref 26.6–33.0)
MCHC: 34 g/dL (ref 31.5–35.7)
MCV: 89 fL (ref 79–97)
Monocytes Absolute: 0.4 10*3/uL (ref 0.1–0.9)
Monocytes: 11 %
Neutrophils Absolute: 2.2 10*3/uL (ref 1.4–7.0)
Neutrophils: 58 %
Platelets: 257 10*3/uL (ref 150–379)
RBC: 4.86 x10E6/uL (ref 3.77–5.28)
RDW: 13.9 % (ref 12.3–15.4)
WBC: 3.8 10*3/uL (ref 3.4–10.8)

## 2016-02-26 NOTE — Telephone Encounter (Signed)
I have spoken with [name], pharmacist at [facility] and then have spoken with Dr. Epimenio Foot. [name] had a total knee replacement some mos. ago.  At that time she was given a rx. for Hydrocodone 10mg  #40.  She did not get that rx. filled.  Last weekend, when she was not able to get to our office to pick up the rx. for Hydrocodone 7.5 that Dr. Epimenio Foot gives for chronic pain, she had the rx. for Hydrocodone 10mg  filled.  Dr. Epimenio Foot has looked at her controlled subtance rx's on the [place] controlled substance registry. Per Dr. Epimenio Foot, it appears she is getting rx's filled at fairly appropriate times.  He is concerned that she uses mult. pharmacies, and that it appears she has gotten rx'd a couple of days early several times.  I have spoken with [facility] and passed this info along to her, emphasizing that the pharmacist's and Dr. Bonnita Hollow only concerns are providing pain relief and a safe manner.  This was a very pleasant, lengthy conversation.  I have advised that she will need to pick one pharmacy to get her controlled substances filled at, and that her next rx. for Hydrocodoe can't be filled before 3-26, and also that only one provider can give her controlled substance rx's.  She verbalized understanding of same, is agreeable.  She will get Hydrocodone rx. filled at [facility] on 3-26, then probably will have future rx's filled at [facility].  She will sign a csa when she comes back into the office./fim

## 2016-02-29 ENCOUNTER — Telehealth: Payer: Self-pay | Admitting: *Deleted

## 2016-02-29 NOTE — Telephone Encounter (Signed)
-----   Message from Asa Lente, MD sent at 02/26/2016 12:22 PM EDT ----- Please note that the blood work was fine.

## 2016-02-29 NOTE — Telephone Encounter (Signed)
I have spoken with [name] and per RAS, advised that labs (cbc) was fine.  She verbalized understanding of same/fim

## 2016-04-14 ENCOUNTER — Telehealth: Payer: Self-pay | Admitting: Neurology

## 2016-04-14 NOTE — Telephone Encounter (Signed)
Message For: ON CALL FAX          Taken 11-MAY-17 at  3:45PM by [facility] ------------------------------------------------------------  Mike Craze Kral               CID  [id number]   Patient  -CALLER-              Pt's Dr  Epimenio Foot         Area Code  336  Phone#  [phone] *  DOB  08/21/1961       RE  NEEDS METHYLPHENIDATE 20MG . RX REFILL CALLED      INTO PHARMACY.                                        Disp:Y/N  Y  If Y = C/B If No Response In 

## 2016-04-15 MED ORDER — METHYLPHENIDATE HCL 20 MG PO TABS: ORAL_TABLET | ORAL | Status: DC

## 2016-04-15 NOTE — Telephone Encounter (Signed)
Methylphenidate rx. up front [facility]/fim 

## 2016-04-15 NOTE — Telephone Encounter (Signed)
Rx. awaiting RAS sig/fim 

## 2016-04-18 ENCOUNTER — Telehealth: Payer: Self-pay | Admitting: *Deleted

## 2016-04-18 NOTE — Telephone Encounter (Signed)
LMTC./fim 

## 2016-04-18 NOTE — Telephone Encounter (Signed)
Pt called, need to pick up methylphenidate (RITALIN) 20 MG tablet early, state that we close at 12:00 on Friday. Please advise. [phone]

## 2016-04-20 MED ORDER — HYDROCODONE-ACETAMINOPHEN 7.5-325 MG PO TABS: 2.0000 | ORAL_TABLET | Freq: Three times a day (TID) | ORAL | Status: DC | PRN

## 2016-04-20 NOTE — Telephone Encounter (Signed)
I have spoken with Genella this afternoon.  Ritalin rx. was placed up front [facility] on 04-15-16.  Hydrocodone rx. up front [facility] today per pt's request/fim

## 2016-05-11 ENCOUNTER — Other Ambulatory Visit: Payer: Self-pay | Admitting: Nurse Practitioner

## 2016-05-11 DIAGNOSIS — N6310 Unspecified lump in the right breast, unspecified quadrant: Secondary | ICD-10-CM

## 2016-05-17 ENCOUNTER — Ambulatory Visit
Admission: RE | Admit: 2016-05-17 | Discharge: 2016-05-17 | Disposition: A | Discharge status: Home / Self Care | Payer: Commercial Managed Care - HMO | Source: Ambulatory Visit | Attending: Nurse Practitioner | Admitting: Nurse Practitioner

## 2016-05-17 DIAGNOSIS — N6310 Unspecified lump in the right breast, unspecified quadrant: Secondary | ICD-10-CM

## 2016-05-18 ENCOUNTER — Telehealth: Payer: Self-pay | Admitting: Neurology

## 2016-05-18 MED ORDER — METHYLPHENIDATE HCL 20 MG PO TABS: ORAL_TABLET | ORAL | Status: DC

## 2016-05-18 MED ORDER — HYDROCODONE-ACETAMINOPHEN 7.5-325 MG PO TABS: 2.0000 | ORAL_TABLET | Freq: Three times a day (TID) | ORAL | Status: DC | PRN

## 2016-05-18 NOTE — Telephone Encounter (Signed)
Ritalin and Hydrocodone rx's up front [facility]/fim 

## 2016-05-18 NOTE — Telephone Encounter (Signed)
RAS is ooo.  Rx's awaiting YY sig/fim 

## 2016-05-18 NOTE — Telephone Encounter (Signed)
Patient requesting refill of methylphenidate (RITALIN) 20 MG tablet and HYDROcodone-acetaminophen (NORCO) 7.5-325 MG tablet. I advised the Rx's will be ready in 24 hours unless the nurse advises otherwise.

## 2016-05-30 MED ORDER — METHYLPHENIDATE HCL 20 MG PO TABS: ORAL_TABLET | ORAL | Status: DC

## 2016-05-30 NOTE — Telephone Encounter (Addendum)
Pt called in and said she is willing to pay out pocket for methylphenidate (RITALIN) 20 MG tablet and would like a new rx. Please call [phone]

## 2016-05-30 NOTE — Telephone Encounter (Signed)
Patient is calling back about her written Rx Ritalin.  She states she picked the Rx up but when she got it to the pharmacy it had not been signed.  The pharmacist knew her and she was going out of town so he went ahead and filled the medication for her.  She states they gave her a different brand then she normally gets and that the medication is not working for her.  She states she somehow needs to get this exchanged for the right brand as she does not want to deal with this the whole 30 days.  please call her [phone].

## 2016-05-30 NOTE — Addendum Note (Signed)
Addended by: Candis Schatz I on: 05/30/2016 01:15 PM   Modules accepted: Orders

## 2016-05-30 NOTE — Telephone Encounter (Signed)
I have spoken with [name].  She sts. the [facility] is not what she usually uses and does not help as much.  She is aware ins. will not cover another duplicate rx. in this 30 days.  She would like a new rx. anyway--sts. will pay cash for it.  Per RAS, ok for new rx.  Rx up front [facility]/fim

## 2016-05-30 NOTE — Addendum Note (Signed)
Addended by: Candis Schatz I on: 05/30/2016 01:14 PM   Modules accepted: Orders

## 2016-06-01 NOTE — Telephone Encounter (Addendum)
[facility] [phone] called to verify RX. Operator relayed info. FYI

## 2016-06-02 ENCOUNTER — Other Ambulatory Visit: Payer: Self-pay | Admitting: Neurology

## 2016-06-16 ENCOUNTER — Telehealth: Payer: Self-pay | Admitting: Neurology

## 2016-06-16 MED ORDER — HYDROCODONE-ACETAMINOPHEN 7.5-325 MG PO TABS: 2.0000 | ORAL_TABLET | Freq: Three times a day (TID) | ORAL | Status: DC | PRN

## 2016-06-16 NOTE — Telephone Encounter (Signed)
Patient called to request refill of HYDROcodone-acetaminophen (NORCO) 7.5-325 MG tablet °

## 2016-06-16 NOTE — Telephone Encounter (Signed)
Hydrocodone rx. up front [facility]/fim 

## 2016-06-16 NOTE — Telephone Encounter (Signed)
Rx. awaiting RAS sig/fim 

## 2016-06-29 ENCOUNTER — Encounter: Payer: Self-pay | Admitting: Neurology

## 2016-06-29 ENCOUNTER — Ambulatory Visit (INDEPENDENT_AMBULATORY_CARE_PROVIDER_SITE_OTHER): Payer: Commercial Managed Care - HMO | Admitting: Neurology

## 2016-06-29 VITALS — BP 124/82 | Resp 16 | Ht 65.0 in | Wt 146.0 lb

## 2016-06-29 DIAGNOSIS — F4323 Adjustment disorder with mixed anxiety and depressed mood: Secondary | ICD-10-CM | POA: Diagnosis not present

## 2016-06-29 DIAGNOSIS — G35 Multiple sclerosis: Secondary | ICD-10-CM | POA: Diagnosis not present

## 2016-06-29 DIAGNOSIS — F909 Attention-deficit hyperactivity disorder, unspecified type: Secondary | ICD-10-CM | POA: Diagnosis not present

## 2016-06-29 DIAGNOSIS — R5382 Chronic fatigue, unspecified: Secondary | ICD-10-CM

## 2016-06-29 DIAGNOSIS — F988 Other specified behavioral and emotional disorders with onset usually occurring in childhood and adolescence: Secondary | ICD-10-CM

## 2016-06-29 DIAGNOSIS — R3911 Hesitancy of micturition: Secondary | ICD-10-CM

## 2016-06-29 DIAGNOSIS — B0229 Other postherpetic nervous system involvement: Secondary | ICD-10-CM

## 2016-06-29 DIAGNOSIS — R269 Unspecified abnormalities of gait and mobility: Secondary | ICD-10-CM | POA: Diagnosis not present

## 2016-06-29 MED ORDER — METHYLPHENIDATE HCL 20 MG PO TABS: ORAL_TABLET | ORAL | 0 refills | Status: DC

## 2016-06-29 MED ORDER — HYDROCODONE-ACETAMINOPHEN 7.5-325 MG PO TABS: 2.0000 | ORAL_TABLET | Freq: Three times a day (TID) | ORAL | 0 refills | Status: DC | PRN

## 2016-06-29 NOTE — Progress Notes (Signed)
[facility]  PATIENT: [name] DOB: 08-Nov-1961 REASON FOR VISIT: Multiple sclerosis   HISTORICAL  CHIEF COMPLAINT:  Chief Complaint  Patient presents with  . Multiple Sclerosis    Sts. she continues to tolerate Tecfidera well.  Thinks legs are some weaker but thinks this may be  heat related/fim    HISTORY OF PRESENT ILLNESS:  [name] is a 55 year old woman with multiple sclerosis since 2008.  She denies exacerbation.  However,  When she is out in the heat, she feels cognition and fatigue are both much worse.    She tolerates Tecfidera well and she has not had any major exacerbation while she has been on it.       Gait/Strength/sensation:   She reports poor gait and right foot drop.   This has worsened some this year.      She has right greater than left leg clumsiness. No recent falls.    She uses a cane at times and sometimes uses a wheelchair for shopping.   She has dysesthesias in her feet (left > right like a stocking that is tight and burning more symmetrically) and milder in her hands. Also (see below) dysesthesias in chest.    Pain radiates down her legs at times.   She gets a tight sensation and a poking sensation in the thoracic spine region.   Baclofen caused headache and palpitations.   Tizanidine makes her sleepy so just takes at night.  PHN:   She had shingles at the sternum and down left arm in December.   Pain lasted until last month at a high level -- better now.   Pain is burning.    She takes hydrocodastone 6/day for pain.   She now takes only 600 mg gabapentin at night (it caused weight gain at higher dose).   Shingles occurred at time of left knee surgery  Bladder:   She has bladder dysfunction and occasional mild incontinence.  Tamsulosin for hesitancy was poorly tolerated.  She has seen urology.   She wears a pad for occasional incontinence.   Vision:    She has diplopia only with gabapentin -- less of a problem now with lower dose.    An early exacerbation was diplopia.    No h/o optic neuritis.   No color asymmetry.   Fatigue/sleep:   She reports physical and cognitive fatigue, much worse with heat. She also has difficulties with attention. She feels both physical and cognitive fatigue and her attentional problems improved when she takes Ritalin. She is currently on the Ritalin 20 mg by mouth 3 times a day (usually 40 mg in am and 20 at noon).    Sleep is better now.   Tizanidine and xanax help sleep onset.   She has nocturia x 4 some night but usually falls back asleep  Mood/cognition:  She has had severe depression in the past and has a mild depression now. Her pain frustrates her.    She stopped Pristiq and feels the Ritalin helps her mood better..  She has mild anxiety, helped by  Xanax .   She notes a brain fog at times  She has some word finding problems and decreased attention.   Methylphenidate has helped a lot and she tolerates it well.      MS History:  In 2008, she had an MRI of the brain performed which was consistent with multiple sclerosis and she was diagnosed with relapsing remitting MS. He was initially placed  on Avonex for therapy. She had some exacerbations with weakness and clumsiness with her gait and she started to see Dr. Leotis Shames. He placed her on Tysabri she is on Tysabri between 2010 and 2013. 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      REVIEW OF SYSTEMS:  Constitutional: No fevers, chills, sweats, or change in appetite.  She notes fatigue Eyes: No visual changes,eye pain.   She has intermittent diplopia Ear, nose and throat: No hearing loss, ear pain, nasal congestion, sore throat Cardiovascular: No chest pain, palpitations Respiratory:  No shortness of breath at rest or with exertion.   No wheezes GastrointestinaI: No nausea, vomiting, diarrhea, abdominal pain, fecal incontinence Genitourinary:  a  sabove Musculoskeletal:  she has both neck pain and back pain. Also bilat knee pain Integumentary: No rash, pruritus, skin lesions.   She has Raynauds in her hands  Neurological: as above Psychiatric: No depression at this time.  Anxiety doing ok on med's Endocrine: No palpitations, diaphoresis, change in appetite, change in weigh or increased thirst Hematologic/Lymphatic:  No anemia, purpura, petechiae. Allergic/Immunologic: No itchy/runny eyes, nasal congestion, recent allergic reactions, rashes  ALLERGIES: Allergies  Allergen Reactions  . Ibuprofen Other (See Comments)    CAUSES LYMPHEDEMA  . Nsaids Other (See Comments)    AVOIDS ANY SODIUM CONTAINING MED. -- CAUSES LYMPHEDEMA  . Other Other (See Comments)    Laxatives- causes leg swelling    HOME MEDICATIONS: Outpatient Medications Prior to Visit  Medication Sig Dispense Refill  . ALPRAZolam (XANAX) 0.5 MG tablet TAKE 1 TABLET BY MOUTH 3 TIMES A DAY AS NEEDED FOR ANXIETY OR SLEEP 90 tablet 5  . Dimethyl Fumarate (TECFIDERA) 240 MG CPDR Take 1 capsule (240 mg total) by mouth 2 (two) times daily. 180 capsule 3  . gabapentin (NEURONTIN) 600 MG tablet Take 1 tablet (600 mg total) by mouth at bedtime.    . hyoscyamine (LEVSIN SL) 0.125 MG SL tablet Place 1 tablet (0.125 mg total) under the tongue every 4 (four) hours as needed. 90 tablet 5  . metoprolol (LOPRESSOR) 50 MG tablet Take 0.5 tablets (25 mg total) by mouth 2 (two) times daily as needed (racing heartbeat). 30 tablet 0  . HYDROcodone-acetaminophen (NORCO) 7.5-325 MG tablet Take 2 tablets by mouth every 8 (eight) hours as needed. 180 tablet 0  . methylphenidate (RITALIN) 20 MG tablet 40 mg in the morning and 20 mg with lunch. 90 tablet 0  . lamoTRIgine (LAMICTAL) 100 MG tablet Take 1 tablet (100 mg total) by mouth daily. (Patient not taking: Reported on 06/29/2016) 60 tablet 11  . lamoTRIgine (LAMICTAL) 25 MG tablet Take 1 tablet (25 mg total) by mouth daily. 1 po qd x 1 wk, then  1 po bid x 1 wk, then 1 po tid x 1 wk, then 2 po bid x 1 wk (Patient not taking: Reported on 06/29/2016) 70 tablet 0  . tiZANidine (ZANAFLEX) 4 MG tablet Take 1 tablet (4 mg total) by mouth 3 (three) times daily as needed for muscle spasms. (Patient not taking: Reported on 06/29/2016) 90 tablet 0  . valACYclovir (VALTREX) 1000 MG tablet Take 1 tablet (1,000 mg total) by mouth 3 (three) times daily. (Patient not taking: Reported on 06/29/2016) 21 tablet 0  . docusate sodium (COLACE) 100 MG capsule Take 1 capsule (100 mg total) by mouth 2 (two) times daily as needed for  mild constipation. (Patient not taking: Reported on 02/25/2016) 20 capsule 1   Facility-Administered Medications Prior to Visit  Medication Dose Route Frequency Provider Last Rate Last Dose  . gadopentetate dimeglumine (MAGNEVIST) injection 15 mL  15 mL Intravenous Once PRN Asa Lente, MD        PAST MEDICAL HISTORY: Past Medical History:  Diagnosis Date  . ADD (attention deficit disorder)   . Anxiety   . Arthritis   . Bladder incontinence    wears a pad  . Bone spur    cervical bone spurs  . Chronic fatigue   . Dysrhythmia    hx A-FIB  . 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 Headache(784.0)   . History of concussion YOUNG ADULT--  NO RESIDUAL  . IBS (irritable bowel syndrome)   . Movement disorder   . MS (multiple sclerosis) (HCC)   . MVP (mitral valve prolapse) HX HEART RACING  YRS AGO   ASYMPTOMATIC SINCE THEN  . Vision abnormalities   . Weakness of right leg SECONDARY TO MS    PAST SURGICAL HISTORY: Past Surgical History:  Procedure Laterality Date  . CESAREAN SECTION  X3   BILATERAL TUBAL LIGATION W/ LAST ONE  . DESTRUCTION OF ANAL CONDYLOMA  07-07-2005  DR TOTH  . HERNIA REPAIR    . KNEE ARTHROSCOPY WITH LATERAL MENISECTOMY  10/26/2012   Procedure: KNEE ARTHROSCOPY WITH LATERAL MENISECTOMY;  Surgeon: Javier Docker, MD;  Location: [facility];  Service: Orthopedics;   Laterality: Left;  Left Knee Arthrsocopy with Partial Lateral Menisectomy  . LAPAROSCOPIC ASSISTED VAGINAL HYSTERECTOMY  01-15-2002  DR Gerlene Burdock HOLLAND/ DR TIM DAVIS   AND REPAIR VENTRAL HERNIA  . LASER ABLATION CONDOLAMATA N/A 04/26/2013   Procedure: LASER ABLATION CONDOLAMATA;  Surgeon: Romie Levee, MD;  Location: [facility];  Service: General;  Laterality: N/A;  . TONSILLECTOMY    . TOTAL KNEE ARTHROPLASTY Left 11/05/2015   Procedure: LEFT TOTAL KNEE ARTHROPLASTY;  Surgeon: Jene Every, MD;  Location: [facility];  Service: Orthopedics;  Laterality: Left;    FAMILY HISTORY: Family History  Problem Relation Age of Onset  . Diabetes Mother   . Hypertension Mother   . Stroke Mother   . Bowel Disease Brother   . Diabetes Brother   . Healthy Brother   . Emphysema Father   . Heart attack Father     SOCIAL HISTORY:  Social History   Social History  . Marital status: Married    Spouse name: N/A  . Number of children: 3  . Years of education: N/A   Occupational History  . Not on file.   Social History Main Topics  . Smoking status: Never Smoker  . Smokeless tobacco: Never Used  . Alcohol use No  . Drug use: No  . Sexual activity: Not on file   Other Topics Concern  . Not on file   Social History Narrative  . No narrative on file     PHYSICAL EXAM  Vitals:   06/29/16 1123  BP: 124/82  Resp: 16  Weight: 146 lb (66.2 kg)  Height: 5\' 5"  (1.651 m)    Body mass index is 24.3 kg/m.   General: The patient is well-developed and well-nourished and in no acute distress   Neurologic Exam  Mental status: The patient is alert and oriented x 3 at the time of the examination. The patient has apparent normal recent and remote memory, with an apparently normal  attention span and concentration ability.   Speech is normal.  Cranial nerves: Extraocular movements are full.   Facial symmetry is present. There is good facial sensation to soft touch bilaterally.    Facial strength is normal.  Trapezius and sternocleidomastoid strength is normal. No dysarthria is noted.  The tongue is midline, and the patient has symmetric elevation of the soft palate.   Motor:  Muscle bulk is normal and tone is minimally increased in both legs. Strength is  5 / 5 in all 4 extremities.   Sensory: Sensory testing is intact to  soft touch and vibration sensation on all 4 extremities.  Coordination: Cerebellar testing reveals good finger-nose-finger bilaterally.  Gait and station: Station is normal and gait is minimally wide. Tandem gait is mildly wide (better). Romberg is negative.   Reflexes: Deep tendon reflexes are symmetric and brisk bilaterally.      DIAGNOSTIC DATA (LABS, IMAGING, TESTING) - I reviewed patient records, labs, notes, testing and imaging myself where available.  Lab Results  Component Value Date   WBC 3.8 02/25/2016   HGB 12.8 11/26/2015   HCT 43.2 02/25/2016   MCV 89 02/25/2016   PLT 257 02/25/2016      Component Value Date/Time   NA 140 11/11/2015   K 4.5 11/11/2015   CL 98 (L) 11/06/2015 0504   CO2 31 11/06/2015 0504   GLUCOSE 149 (H) 11/06/2015 0504   BUN 15 11/11/2015   CREATININE 0.7 11/11/2015   CREATININE 0.94 11/06/2015 0504   CREATININE 0.65 09/24/2015 1149   CALCIUM 8.1 (L) 11/06/2015 0504   PROT 6.9 09/24/2015 1149   ALBUMIN 4.4 09/24/2015 1149   AST 29 11/11/2015   ALT 48 (A) 11/11/2015   ALKPHOS 77 11/11/2015   BILITOT 0.6 09/24/2015 1149   GFRNONAA >60 11/06/2015 0504   GFRAA >60 11/06/2015 0504   Lab Results  Component Value Date   CHOL 196 12/25/2014   HDL 92 12/25/2014   LDLCALC 91 12/25/2014   TRIG 65 12/25/2014   CHOLHDL 2.1 12/25/2014   Lab Results  Component Value Date   HGBA1C  03/10/2010    5.5 (NOTE) The ADA recommends the following therapeutic goal for glycemic control related to Hgb A1c measurement: Goal of therapy: <6.5 Hgb A1c  Reference: American Diabetes Association: Clinical Practice  Recommendations 2010, Diabetes Care, 2010, 33: (Suppl  1).      ASSESSMENT AND PLAN  Multiple sclerosis (HCC)  Abnormality of gait  Attention deficit disorder  Chronic fatigue  Urinary hesitancy  Adjustment disorder with mixed anxiety and depressed mood  PHN (postherpetic neuralgia)   1.   Continue Tecfidera. Last CBC was fine, check again at next visit. 2.   Refill ritalin, hydrocodone.   Continue other med's    She will try three different ritalin generics to see which one worked best for her (10 days each) 3.   Remain active, try to exercise 4.  rtc 4-6 months, sooner if problems   Shamon Cothran A. Epimenio Foot, MD, PhD 06/29/2016, 11:52 AM Certified in Neurology, Clinical Neurophysiology, Sleep Medicine, Pain Medicine and Neuroimaging  [facility] [address] [place], [place] [address] [phone]

## 2016-08-09 ENCOUNTER — Telehealth: Payer: Self-pay | Admitting: Neurology

## 2016-08-09 MED ORDER — METHYLPHENIDATE HCL 20 MG PO TABS: ORAL_TABLET | ORAL | 0 refills | Status: DC

## 2016-08-09 NOTE — Telephone Encounter (Signed)
Patient called to request refill of methylphenidate (RITALIN) in 3 10 day supplies.

## 2016-08-09 NOTE — Telephone Encounter (Signed)
Rx's awaiting RAS sig/fim 

## 2016-08-10 NOTE — Telephone Encounter (Signed)
Ritalin rx's up front [facility]/fim 

## 2016-08-18 ENCOUNTER — Other Ambulatory Visit: Payer: Self-pay | Admitting: *Deleted

## 2016-08-18 ENCOUNTER — Telehealth: Payer: Self-pay | Admitting: Neurology

## 2016-08-18 DIAGNOSIS — G894 Chronic pain syndrome: Secondary | ICD-10-CM

## 2016-08-18 MED ORDER — HYDROCODONE-ACETAMINOPHEN 7.5-325 MG PO TABS: 2.0000 | ORAL_TABLET | Freq: Three times a day (TID) | ORAL | 0 refills | Status: DC | PRN

## 2016-08-18 NOTE — Telephone Encounter (Signed)
Hydrocodone rx. up front [facility].  UDS ordered, in compliance with state guidelines/fim

## 2016-08-18 NOTE — Telephone Encounter (Signed)
Rx. awaiting RAS sig/fim 

## 2016-08-18 NOTE — Telephone Encounter (Signed)
Patient requesting refill of HYDROcodone-acetaminophen (NORCO) 7.5-325 MG tablet °Pharmacy: pick up ° °

## 2016-08-19 ENCOUNTER — Other Ambulatory Visit (INDEPENDENT_AMBULATORY_CARE_PROVIDER_SITE_OTHER): Payer: Self-pay

## 2016-08-19 DIAGNOSIS — G894 Chronic pain syndrome: Secondary | ICD-10-CM

## 2016-08-19 DIAGNOSIS — Z0289 Encounter for other administrative examinations: Secondary | ICD-10-CM

## 2016-08-26 LAB — DRUG SCREEN, UR (12+OXYCODONE+CRT)
Amphetamine Scrn, Ur: NEGATIVE ng/mL
BARBITURATE SCREEN URINE: NEGATIVE ng/mL
BENZODIAZEPINE SCREEN, URINE: POSITIVE ng/mL — AB
CANNABINOIDS UR QL SCN: NEGATIVE ng/mL
Cocaine (Metab) Scrn, Ur: NEGATIVE ng/mL
Creatinine(Crt), U: 231.9 mg/dL (ref 20.0–300.0)
Fentanyl, Urine: NEGATIVE pg/mL
Meperidine Screen, Urine: NEGATIVE ng/mL
Methadone Screen, Urine: NEGATIVE ng/mL
OXYCODONE+OXYMORPHONE UR QL SCN: NEGATIVE ng/mL
Opiate Scrn, Ur: POSITIVE ng/mL — AB
Ph of Urine: 6.2 (ref 4.5–8.9)
Phencyclidine Qn, Ur: NEGATIVE ng/mL
Propoxyphene Scrn, Ur: NEGATIVE ng/mL
SPECIFIC GRAVITY: 1.018
Tramadol Screen, Urine: NEGATIVE ng/mL

## 2016-09-05 ENCOUNTER — Telehealth: Payer: Self-pay | Admitting: Neurology

## 2016-09-05 MED ORDER — METHYLPHENIDATE HCL 20 MG PO TABS: ORAL_TABLET | ORAL | 0 refills | Status: DC

## 2016-09-05 NOTE — Telephone Encounter (Signed)
Rx. awaiting RAS sig/fim 

## 2016-09-05 NOTE — Telephone Encounter (Signed)
Ritalin rx. up front [facility]/fim 

## 2016-09-05 NOTE — Telephone Encounter (Signed)
Patient requesting refill of methylphenidate (RITALIN) 20 MG tablet - Brand name as she has met her deductible with a 30 day supply Pharmacy: pick up

## 2016-09-07 MED ORDER — METHYLPHENIDATE HCL 20 MG PO TABS: ORAL_TABLET | ORAL | 0 refills | Status: DC

## 2016-09-07 NOTE — Telephone Encounter (Signed)
Rx. awaiting RAS sig/fim 

## 2016-09-07 NOTE — Addendum Note (Signed)
Addended by: Candis Schatz I on: 09/07/2016 08:56 AM   Modules accepted: Orders

## 2016-09-07 NOTE — Telephone Encounter (Signed)
Pt called in asking if she can get a 30 day supply of methylphenidate (RITALIN) 20 MG tablet. Pt states it would be a total of 60 pills. She has come by once to pick up a rx which will give her 10 days worth. She takes 2 in the morning and one at lunch. Pt will need name brand at this time. Please call pt to give update

## 2016-09-07 NOTE — Telephone Encounter (Signed)
Ritalin rx. up front [facility]/fim 

## 2016-09-13 ENCOUNTER — Encounter (HOSPITAL_COMMUNITY): Payer: Self-pay | Admitting: *Deleted

## 2016-09-13 ENCOUNTER — Telehealth: Payer: Self-pay | Admitting: Neurology

## 2016-09-13 ENCOUNTER — Emergency Department (HOSPITAL_COMMUNITY): Payer: Commercial Managed Care - HMO

## 2016-09-13 ENCOUNTER — Emergency Department (HOSPITAL_COMMUNITY)
Admission: EM | Admit: 2016-09-13 | Discharge: 2016-09-13 | Disposition: A | Discharge status: Home / Self Care | Payer: Commercial Managed Care - HMO | Attending: Emergency Medicine | Admitting: Emergency Medicine

## 2016-09-13 DIAGNOSIS — R0789 Other chest pain: Secondary | ICD-10-CM | POA: Diagnosis present

## 2016-09-13 DIAGNOSIS — Z96652 Presence of left artificial knee joint: Secondary | ICD-10-CM | POA: Diagnosis not present

## 2016-09-13 LAB — BASIC METABOLIC PANEL
Anion gap: 9 (ref 5–15)
BUN: 18 mg/dL (ref 6–20)
CO2: 26 mmol/L (ref 22–32)
Calcium: 8.9 mg/dL (ref 8.9–10.3)
Chloride: 103 mmol/L (ref 101–111)
Creatinine, Ser: 0.85 mg/dL (ref 0.44–1.00)
GFR calc Af Amer: >60 mL/min (ref >60)
GFR calc non Af Amer: >60 mL/min (ref >60)
Glucose, Bld: 81 mg/dL (ref 65–99)
Potassium: 3.7 mmol/L (ref 3.5–5.1)
Sodium: 138 mmol/L (ref 135–145)

## 2016-09-13 LAB — CBC
HCT: 40.3 % (ref 36.0–46.0)
Hemoglobin: 13.5 g/dL (ref 12.0–15.0)
MCH: 30.2 pg (ref 26.0–34.0)
MCHC: 33.5 g/dL (ref 30.0–36.0)
MCV: 90.2 fL (ref 78.0–100.0)
Platelets: 236 10*3/uL (ref 150–400)
RBC: 4.47 MIL/uL (ref 3.87–5.11)
RDW: 13.1 % (ref 11.5–15.5)
WBC: 8.9 10*3/uL (ref 4.0–10.5)

## 2016-09-13 LAB — I-STAT TROPONIN, ED
Troponin i, poc: 0 ng/mL (ref 0.00–0.08)
Troponin i, poc: 0 ng/mL (ref 0.00–0.08)

## 2016-09-13 NOTE — ED Triage Notes (Signed)
Pt to ED by EMS c/o palpitations onset at 1530. Pt was at home and felt a change in heart rate, reports initial pulse 215. Pt drove to a fire station nearby, rate of 130 and orthostatic. Pt was cool and clammy, felt palpitations then went to have a BM and felt pressure in chest. Pt took a lopressor pta. EMS reports +orthostatics, bp 119/78 pulse 130 lying, then bp 69/46 and pulse of 79 standing. EmS gave 4mg  zofran enroute for nausea. Denies pain

## 2016-09-13 NOTE — Discharge Instructions (Signed)
Please follow with your primary care doctor in the next 2 days for a check-up. They must obtain records for further management.  ° °Do not hesitate to return to the Emergency Department for any new, worsening or concerning symptoms.  ° °

## 2016-09-13 NOTE — ED Provider Notes (Signed)
[facility]-EMERGENCY DEPT Provider Note   CSN: [id number] Arrival date & time: 09/13/16  1706     History   Chief Complaint Chief Complaint  Patient presents with  . Chest Pain    HPI  Blood pressure 118/75, pulse 60, temperature 97.9 F (36.6 C), resp. rate 13, SpO2 100 %.  [name] is a 55 y.o. female with past medical history significant for dysrhythmia, MDP complaining of retrosternal chest pain which was non-radiating and described as pressure-like and burning onset this afternoon, she felt her heart racing. She took her pulse and it was over 200. Her husband took her to a fire station and on the monitor her heart rate was 1:30, she began to be nauseous after vomiting she felt much better however when she stood up her blood pressure decreased and she felt very lightheaded, she did not syncopized. She states that she didn't eat all day, she been out in the heat. She didn't have any chest pain at this time. When the chest pain came it lasted for only a few minutes, it resolved spontaneously and was not exacerbated by exertion, cough, palpation. No history of ACS, smoking, diabetes, hypertension, hyperlipidemia, DVT/PE, recent immobilizations, calf pain, leg swelling, orthopnea, PND.  HPI  Past Medical History:  Diagnosis Date  . ADD (attention deficit disorder)   . Anxiety   . Arthritis   . Bladder incontinence    wears a pad  . Bone spur    cervical bone spurs  . Chronic fatigue   . Dysrhythmia    hx A-FIB  . 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 Headache(784.0)   . History of concussion YOUNG ADULT--  NO RESIDUAL  . IBS (irritable bowel syndrome)   . Movement disorder   . MS (multiple sclerosis) (HCC)   . MVP (mitral valve prolapse) HX HEART RACING  YRS AGO   ASYMPTOMATIC SINCE THEN  . Vision abnormalities   . Weakness of right leg SECONDARY TO MS    Patient Active Problem List   Diagnosis Date Noted  . Dysesthesia 02/25/2016  . PHN  (postherpetic neuralgia) 02/25/2016  . 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 Preop cardiovascular exam 03/20/2015  . DJD (degenerative joint disease) 02/12/2015  . Chest pain 12/28/2014  . Paroxysmal atrial fibrillation (HCC) 12/28/2014  . Multiple sclerosis (HCC) 12/22/2014  . Abnormality of gait 12/22/2014  . Attention deficit disorder 12/22/2014  . Chronic fatigue 12/22/2014  . Urinary hesitancy 12/22/2014  . Adjustment disorder with mixed anxiety and depressed mood 12/22/2014  . 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 CESAREAN SECTION  X3   BILATERAL TUBAL LIGATION W/ LAST ONE  . DESTRUCTION OF ANAL CONDYLOMA  07-07-2005  DR TOTH  . HERNIA REPAIR    . KNEE ARTHROSCOPY WITH LATERAL MENISECTOMY  10/26/2012   Procedure: KNEE ARTHROSCOPY WITH LATERAL MENISECTOMY;  Surgeon: Javier DockerJeffrey C Beane, MD;  [facility] [place];  Service: Orthopedics;  Laterality: Left;  Left Knee Arthrsocopy with Partial Lateral Menisectomy  . LAPAROSCOPIC ASSISTED VAGINAL HYSTERECTOMY  01-15-2002  DR Gerlene BurdockICHARD HOLLAND/ DR TIM DAVIS   AND REPAIR VENTRAL HERNIA  . LASER ABLATION CONDOLAMATA N/A 04/26/2013   Procedure: LASER ABLATION CONDOLAMATA;  Surgeon: Romie LeveeAlicia Thomas, MD;  [facility] [place];  Service: General;  Laterality: N/A;  . TONSILLECTOMY    . TOTAL KNEE ARTHROPLASTY Left 11/05/2015   Procedure: LEFT TOTAL KNEE ARTHROPLASTY;  Surgeon: Jene EveryJeffrey Beane,  MD;  Location: [facility];  Service: Orthopedics;  Laterality: Left;    OB History    No data available       Home Medications    Prior to Admission medications   Medication Sig Start Date End Date Taking? Authorizing Provider  ALPRAZolam (XANAX) 0.5 MG tablet TAKE 1 TABLET BY MOUTH 3 TIMES A DAY AS NEEDED FOR ANXIETY OR SLEEP Patient taking differently: TAKE 1 TABLET BY MOUTH EVERY NIGHT 06/03/16  Yes Asa Lente, MD  CALCIUM-MAG-VIT C-VIT D PO Take 1 tablet by mouth daily.   Yes  Historical Provider, MD  Dimethyl Fumarate (TECFIDERA) 240 MG CPDR Take 1 capsule (240 mg total) by mouth 2 (two) times daily. 01/04/16  Yes Asa Lente, MD  gabapentin (NEURONTIN) 600 MG tablet Take 1 tablet (600 mg total) by mouth at bedtime. 11/11/15  Yes Mahima Glade Lloyd, MD  HYDROcodone-acetaminophen (NORCO) 7.5-325 MG tablet Take 2 tablets by mouth every 8 (eight) hours as needed. Patient taking differently: Take 2 tablets by mouth every 8 (eight) hours as needed for moderate pain.  08/18/16  Yes Asa Lente, MD  Magnesium 200 MG TABS Take 200 mg by mouth daily.   Yes Historical Provider, MD  methylphenidate (RITALIN) 20 MG tablet 40 mg in the morning and 20 mg with lunch. Patient taking differently: Take 20-40 mg by mouth 2 (two) times daily with breakfast and lunch. 40 mg in the morning and 20 mg with lunch. 09/07/16  Yes Asa Lente, MD  metoprolol (LOPRESSOR) 50 MG tablet Take 0.5 tablets (25 mg total) by mouth 2 (two) times daily as needed (racing heartbeat). Patient taking differently: Take 25-50 mg by mouth 2 (two) times daily as needed (TACHYCARDIA).  12/30/14  Yes Kathlen Mody, MD  NON FORMULARY Take 1 tablet by mouth See admin instructions. MALIC ACID-TAKES 1 TAB IN EVENING AND 1 TAB AT BEDTIME   Yes Historical Provider, MD  hyoscyamine (LEVSIN SL) 0.125 MG SL tablet Place 1 tablet (0.125 mg total) under the tongue every 4 (four) hours as needed. Patient not taking: Reported on 09/13/2016 10/26/15   Asa Lente, MD  lamoTRIgine (LAMICTAL) 100 MG tablet Take 1 tablet (100 mg total) by mouth daily. Patient not taking: Reported on 09/13/2016 02/25/16   Asa Lente, MD  lamoTRIgine (LAMICTAL) 25 MG tablet Take 1 tablet (25 mg total) by mouth daily. 1 po qd x 1 wk, then 1 po bid x 1 wk, then 1 po tid x 1 wk, then 2 po bid x 1 wk Patient not taking: Reported on 09/13/2016 02/25/16   Asa Lente, MD  tiZANidine (ZANAFLEX) 4 MG tablet Take 1 tablet (4 mg total) by mouth 3  (three) times daily as needed for muscle spasms. Patient not taking: Reported on 09/13/2016 01/21/16   Asa Lente, MD  valACYclovir (VALTREX) 1000 MG tablet Take 1 tablet (1,000 mg total) by mouth 3 (three) times daily. Patient not taking: Reported on 09/13/2016 11/26/15   [name], MD    Family History Family History  Problem Relation Age of Onset  . Diabetes Mother   . Hypertension Mother   . Stroke Mother   . Bowel Disease Brother   . Diabetes Brother   . Healthy Brother   . Emphysema Father   . Heart attack Father     Social History Social History  Substance Use Topics  . Smoking status: Never Smoker  . Smokeless tobacco: Never Used  . Alcohol  use No     Allergies   Ibuprofen; Nsaids; and Other   Review of Systems Review of Systems  10 systems reviewed and found to be negative, except as noted in the HPI.   Physical Exam Updated Vital Signs BP 114/83 (BP Location: Right Arm)   Pulse 69   Temp 97.9 F (36.6 C)   Resp 13   SpO2 95%   Physical Exam  Constitutional: She is oriented to person, place, and time. She appears well-developed and well-nourished. No distress.  HENT:  Head: Normocephalic.  Mouth/Throat: Oropharynx is clear and moist.  Eyes: Conjunctivae are normal.  Neck: Normal range of motion. No JVD present. No tracheal deviation present.  Cardiovascular: Normal rate, regular rhythm and intact distal pulses.   Radial pulse equal bilaterally  Pulmonary/Chest: Effort normal and breath sounds normal. No stridor. No respiratory distress. She has no wheezes. She has no rales. She exhibits no tenderness.  Abdominal: Soft. She exhibits no distension and no mass. There is no tenderness. There is no rebound and no guarding.  Musculoskeletal: Normal range of motion. She exhibits no edema or tenderness.  No calf asymmetry, superficial collaterals, palpable cords, edema, Homans sign negative bilaterally.    Neurological: She is alert and oriented  to person, place, and time.  Skin: Skin is warm. She is not diaphoretic.  Psychiatric: She has a normal mood and affect.  Nursing note and vitals reviewed.    ED Treatments / Results  Labs (all labs ordered are listed, but only abnormal results are displayed) Labs Reviewed  BASIC METABOLIC PANEL  CBC  I-STAT TROPOININ, ED  I-STAT TROPOININ, ED    EKG  EKG Interpretation  Date/Time:  Tuesday September 13 2016 17:16:10 EDT Ventricular Rate:  59 PR Interval:    QRS Duration: 95 QT Interval:  446 QTC Calculation: 442 R Axis:   85 Text Interpretation:  Sinus rhythm Borderline ST elevation, inferior leads since last tracing no significant change Confirmed by Effie Shy  MD, ELLIOTT [phone]) on 09/13/2016 5:20:04 PM       Radiology Dg Chest 2 View  Result Date: 09/13/2016 CLINICAL DATA:  Chest pressure EXAM: CHEST  2 VIEW COMPARISON:  05/26/2015 chest radiograph. FINDINGS: Stable cardiomediastinal silhouette with normal heart size. No pneumothorax. No pleural effusion. Lungs appear clear, with no acute consolidative airspace disease and no pulmonary edema. IMPRESSION: No active cardiopulmonary disease. Electronically Signed   By: Delbert Phenix M.D.   On: 09/13/2016 17:50    Procedures Procedures (including critical care time)  Medications Ordered in ED Medications - No data to display   Initial Impression / Assessment and Plan / ED Course  I have reviewed the triage vital signs and the nursing notes.  Pertinent labs & imaging results that were available during my care of the patient were reviewed by me and considered in my medical decision making (see chart for details).  Clinical Course    Vitals:   09/13/16 1730 09/13/16 1830 09/13/16 1845 09/13/16 2036  BP: 118/75 127/74 110/68 114/83  Pulse: 60 (!) 57 (!) 57 69  Resp: 13 12 10 13   Temp:      SpO2: 100% 94% 98% 95%    [name] is 56 y.o. female presenting with Atypical and resolved chest pain EKG with no  changes, patient reports initially her heart rate was over 200s however, we do not see this on the monitor in the ED. She does have a history of paroxysmal A. fib. blood work reassuring  with negative troponin. Low risk by heart score. Patient is remained chest pain-free in the ED. She will follow closely with Dr. Jens Som. No abnormality on orthostatic vital signs and patient has remained chest pain-free in the ED.  Evaluation does not show pathology that would require ongoing emergent intervention or inpatient treatment. Pt is hemodynamically stable and mentating appropriately. Discussed findings and plan with patient/guardian, who agrees with care plan. All questions answered. Return precautions discussed and outpatient follow up given.     Final Clinical Impressions(s) / ED Diagnoses   Final diagnoses:  Atypical chest pain    New Prescriptions New Prescriptions   No medications on file     Wynetta Emery, PA-C 09/13/16 2037    Wynetta Emery, PA-C 09/13/16 2045    Mancel Bale, MD 09/15/16 1316

## 2016-09-13 NOTE — ED Notes (Signed)
Denies concerns with dc 

## 2016-09-13 NOTE — Telephone Encounter (Signed)
Patient called to request refill of HYDROcodone-acetaminophen (NORCO) 7.5-325 MG tablet and husband will pick up both this Rx and Ritalin Rx that is ready for pick up at front desk.

## 2016-09-14 MED ORDER — HYDROCODONE-ACETAMINOPHEN 7.5-325 MG PO TABS: 2.0000 | ORAL_TABLET | Freq: Three times a day (TID) | ORAL | 0 refills | Status: DC | PRN

## 2016-09-14 NOTE — Telephone Encounter (Signed)
Placed on Dr. Bonnita Hollow desk for pick up

## 2016-09-14 NOTE — Addendum Note (Signed)
Addended by: Crisoforo Oxford on: 09/14/2016 07:12 AM   Modules accepted: Orders

## 2016-09-14 NOTE — Telephone Encounter (Signed)
Rx refill hydrocodone

## 2016-10-06 ENCOUNTER — Telehealth: Payer: Self-pay | Admitting: Neurology

## 2016-10-06 NOTE — Telephone Encounter (Addendum)
Pt request wants to get the phone number for the MS seminar that Dr Epimenio Foot will be there. She is wanting to go and she said she cannot retrieve it from her phone.

## 2016-10-06 NOTE — Telephone Encounter (Signed)
I have spoken with [name] this afternoon--she found # in her phone/fim

## 2016-10-19 ENCOUNTER — Ambulatory Visit (INDEPENDENT_AMBULATORY_CARE_PROVIDER_SITE_OTHER): Payer: Commercial Managed Care - HMO | Admitting: Neurology

## 2016-10-19 ENCOUNTER — Encounter: Payer: Self-pay | Admitting: Neurology

## 2016-10-19 VITALS — BP 130/78 | HR 66 | Ht 65.0 in | Wt 137.0 lb

## 2016-10-19 DIAGNOSIS — R4189 Other symptoms and signs involving cognitive functions and awareness: Secondary | ICD-10-CM

## 2016-10-19 DIAGNOSIS — R269 Unspecified abnormalities of gait and mobility: Secondary | ICD-10-CM

## 2016-10-19 DIAGNOSIS — R3911 Hesitancy of micturition: Secondary | ICD-10-CM | POA: Diagnosis not present

## 2016-10-19 DIAGNOSIS — R5382 Chronic fatigue, unspecified: Secondary | ICD-10-CM | POA: Diagnosis not present

## 2016-10-19 DIAGNOSIS — G35 Multiple sclerosis: Secondary | ICD-10-CM | POA: Diagnosis not present

## 2016-10-19 DIAGNOSIS — R208 Other disturbances of skin sensation: Secondary | ICD-10-CM

## 2016-10-19 DIAGNOSIS — F988 Other specified behavioral and emotional disorders with onset usually occurring in childhood and adolescence: Secondary | ICD-10-CM

## 2016-10-19 MED ORDER — HYDROCODONE-ACETAMINOPHEN 7.5-325 MG PO TABS: 2.0000 | ORAL_TABLET | Freq: Three times a day (TID) | ORAL | 0 refills | Status: DC | PRN

## 2016-10-19 MED ORDER — METHYLPHENIDATE HCL 20 MG PO TABS: ORAL_TABLET | ORAL | 0 refills | Status: DC

## 2016-10-19 NOTE — Progress Notes (Signed)
[facility]  PATIENT: [name] DOB: 18-Sep-1961 REASON FOR VISIT: Multiple sclerosis   HISTORICAL  CHIEF COMPLAINT:  Chief Complaint  Patient presents with  . Multiple Sclerosis    Sts. she continues to tolerate Tecfidera with some mild flushing with the evening dose. Denies new or worsening sx/fim    HISTORY OF PRESENT ILLNESS:  [name] is a 55 year old woman with multiple sclerosis diagnosed in 2008.    MS:   She is on Tecfidera and denies exacerbation.  She notes cognitive and physical fatigue are bad and today is a worse than typical day..    She tolerates Tecfidera well and she has not had any major exacerbation while she has been on it.     She is trying to eat better and has reduced her sodium intake   Gait/Strength/sensation:   She reports reduced balance and poor gait.   She has a right foot drop and drags the foot at times.    No recent falls.    She also has right greater than left leg clumsiness.  She uses a cane sometimes if exhausted.  She occ shops with a scooter.    She has dysesthesias in her feet and milder in her hands.    Pain is burning and left > right up to the mid shin.    She gets a tight sensation and a poking sensation in the thoracic spine region.   Baclofen caused headache and palpitations.   Tizanidine makes her sleepy so just takes at night.  PHN:   She had shingles at the sternum and down left arm in December 2016 and still has burning there.   She now takes only 600 mg gabapentin at night (it caused weight gain at higher dose).  She is also on hydrocodone up to 6/day.   Shingles occurred at time of left knee surgery  Bladder:   She has bladder dysfunction and occasional mild incontinence (wears Poise pad).  Tamsulosin  was poorly tolerated.  She has seen urology.     Vision:    She is doing about the same with occasional diplopia, worse when tired.   An early exacerbation was diplopia.    No h/o optic neuritis.   No color  asymmetry.   Fatigue/sleep:   She reports physical and cognitive fatigue, much worse with heat. She also has difficulties with attention. She feels both physical and cognitive fatigue and her attentional problems improved when she takes Ritalin. She is currently on the Ritalin 20 mg by mouth 3 times a day (usually 40 mg in am and 20 at noon).  Brand was much more effective for her than generic.    Sleep is better on gabapentin at night.   Xanax help sleep onset.   She has nocturia x 4 some night but usually falls back asleep  Mood/cognition:  She has had severe depression in the past and has a mild depression now. Her pain frustrates her.  She is no longer on an antidepressant.  She has mild anxiety, helped by  Xanax .   She notes a brain fog at times and  Ritalin has helped a lot and she tolerates it well.      She has some word finding problems and decreased attention.    MS History:  In 2008, she had an MRI of the brain performed which was consistent with multiple sclerosis and she was diagnosed with relapsing remitting MS. He was initially placed on Avonex  for therapy. She had some exacerbations with weakness and clumsiness with her gait and she started to see Dr. Leotis Shames. He placed her on Tysabri she is on Tysabri between 2010 and 2013. 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      REVIEW OF SYSTEMS:  Constitutional: No fevers, chills, sweats, or change in appetite.  She notes fatigue Eyes: No visual changes,eye pain.   She has intermittent diplopia Ear, nose and throat: No hearing loss, ear pain, nasal congestion, sore throat Cardiovascular: No chest pain, palpitations Respiratory:  No shortness of breath at rest or with exertion.   No wheezes GastrointestinaI: No nausea, vomiting, diarrhea, abdominal pain, fecal incontinence Genitourinary:  a sabove Musculoskeletal:  she has both neck pain and back  pain. Also bilat knee pain Integumentary: No rash, pruritus, skin lesions.   She has Raynauds in her hands  Neurological: as above Psychiatric: No depression at this time.  Anxiety doing ok on med's Endocrine: No palpitations, diaphoresis, change in appetite, change in weigh or increased thirst Hematologic/Lymphatic:  No anemia, purpura, petechiae. Allergic/Immunologic: No itchy/runny eyes, nasal congestion, recent allergic reactions, rashes  ALLERGIES: Allergies  Allergen Reactions  . Ibuprofen Other (See Comments)    CAUSES LYMPHEDEMA  . Nsaids Other (See Comments)    AVOIDS ANY SODIUM CONTAINING MED. -- CAUSES LYMPHEDEMA  . Other Swelling and Other (See Comments)    Laxatives- causes leg swelling    HOME MEDICATIONS: Outpatient Medications Prior to Visit  Medication Sig Dispense Refill  . ALPRAZolam (XANAX) 0.5 MG tablet TAKE 1 TABLET BY MOUTH 3 TIMES A DAY AS NEEDED FOR ANXIETY OR SLEEP (Patient taking differently: TAKE 1 TABLET BY MOUTH EVERY NIGHT) 90 tablet 5  . CALCIUM-MAG-VIT C-VIT D PO Take 1 tablet by mouth daily.    . Dimethyl Fumarate (TECFIDERA) 240 MG CPDR Take 1 capsule (240 mg total) by mouth 2 (two) times daily. 180 capsule 3  . gabapentin (NEURONTIN) 600 MG tablet Take 1 tablet (600 mg total) by mouth at bedtime.    Marland Kitchen HYDROcodone-acetaminophen (NORCO) 7.5-325 MG tablet Take 2 tablets by mouth every 8 (eight) hours as needed. 180 tablet 0  . hyoscyamine (LEVSIN SL) 0.125 MG SL tablet Place 1 tablet (0.125 mg total) under the tongue every 4 (four) hours as needed. 90 tablet 5  . Magnesium 200 MG TABS Take 200 mg by mouth daily.    . methylphenidate (RITALIN) 20 MG tablet 40 mg in the morning and 20 mg with lunch. (Patient taking differently: Take 20-40 mg by mouth 2 (two) times daily with breakfast and lunch. 40 mg in the morning and 20 mg with lunch.) 90 tablet 0  . metoprolol (LOPRESSOR) 50 MG tablet Take 0.5 tablets (25 mg total) by mouth 2 (two) times daily as  needed (racing heartbeat). (Patient taking differently: Take 25-50 mg by mouth 2 (two) times daily as needed (TACHYCARDIA). ) 30 tablet 0  . NON FORMULARY Take 1 tablet by mouth See admin instructions. MALIC ACID-TAKES 1 TAB IN EVENING AND 1 TAB AT BEDTIME    . lamoTRIgine (LAMICTAL) 100 MG tablet Take 1 tablet (100 mg total) by mouth daily. (Patient not taking: Reported on 09/13/2016) 60 tablet 11  . lamoTRIgine (LAMICTAL) 25 MG tablet Take 1 tablet (25 mg total) by mouth daily. 1 po qd x 1 wk, then 1 po bid x 1 wk, then 1  po tid x 1 wk, then 2 po bid x 1 wk (Patient not taking: Reported on 09/13/2016) 70 tablet 0  . tiZANidine (ZANAFLEX) 4 MG tablet Take 1 tablet (4 mg total) by mouth 3 (three) times daily as needed for muscle spasms. (Patient not taking: Reported on 09/13/2016) 90 tablet 0  . valACYclovir (VALTREX) 1000 MG tablet Take 1 tablet (1,000 mg total) by mouth 3 (three) times daily. (Patient not taking: Reported on 09/13/2016) 21 tablet 0   Facility-Administered Medications Prior to Visit  Medication Dose Route Frequency Provider Last Rate Last Dose  . gadopentetate dimeglumine (MAGNEVIST) injection 15 mL  15 mL Intravenous Once PRN Asa Lenteichard A Johntae Broxterman, MD        PAST MEDICAL HISTORY: Past Medical History:  Diagnosis Date  . ADD (attention deficit disorder)   . Anxiety   . Arthritis   . Bladder incontinence    wears a pad  . Bone spur    cervical bone spurs  . Chronic fatigue   . Dysrhythmia    hx A-FIB  . 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 Headache(784.0)   . History of concussion YOUNG ADULT--  NO RESIDUAL  . IBS (irritable bowel syndrome)   . Movement disorder   . MS (multiple sclerosis) (HCC)   . MVP (mitral valve prolapse) HX HEART RACING  YRS AGO   ASYMPTOMATIC SINCE THEN  . Vision abnormalities   . Weakness of right leg SECONDARY TO MS    PAST SURGICAL HISTORY: Past Surgical History:  Procedure Laterality Date  . CESAREAN SECTION  X3    BILATERAL TUBAL LIGATION W/ LAST ONE  . DESTRUCTION OF ANAL CONDYLOMA  07-07-2005  DR TOTH  . HERNIA REPAIR    . KNEE ARTHROSCOPY WITH LATERAL MENISECTOMY  10/26/2012   Procedure: KNEE ARTHROSCOPY WITH LATERAL MENISECTOMY;  Surgeon: Javier DockerJeffrey C Beane, MD;  [facility] [place];  Service: Orthopedics;  Laterality: Left;  Left Knee Arthrsocopy with Partial Lateral Menisectomy  . LAPAROSCOPIC ASSISTED VAGINAL HYSTERECTOMY  01-15-2002  DR Gerlene BurdockICHARD HOLLAND/ DR TIM DAVIS   AND REPAIR VENTRAL HERNIA  . LASER ABLATION CONDOLAMATA N/A 04/26/2013   Procedure: LASER ABLATION CONDOLAMATA;  Surgeon: Romie LeveeAlicia Thomas, MD;  [facility] [place];  Service: General;  Laterality: N/A;  . TONSILLECTOMY    . TOTAL KNEE ARTHROPLASTY Left 11/05/2015   Procedure: LEFT TOTAL KNEE ARTHROPLASTY;  Surgeon: Jene EveryJeffrey Beane, MD;  Location: [facility];  Service: Orthopedics;  Laterality: Left;    FAMILY HISTORY: Family History  Problem Relation Age of Onset  . Diabetes Mother   . Hypertension Mother   . Stroke Mother   . Bowel Disease Brother   . Diabetes Brother   . Healthy Brother   . Emphysema Father   . Heart attack Father     SOCIAL HISTORY:  Social History   Social History  . Marital status: Married    Spouse name: N/A  . Number of children: 3  . Years of education: N/A   Occupational History  . Not on file.   Social History Main Topics  . Smoking status: Never Smoker  . Smokeless tobacco: Never Used  . Alcohol use No  . Drug use: No  . Sexual activity: Not on file   Other Topics Concern  . Not on file   Social History Narrative  . No narrative on file     PHYSICAL EXAM  Vitals:   10/19/16 1005  BP: 130/78  Pulse: 66  Weight: 137 lb (62.1 kg)  Height: 5\' 5"  (1.651 m)    Body mass index is 22.8 kg/m.   General: The patient is well-developed and well-nourished and in no acute distress   Neurologic Exam  Mental status: The patient is alert and oriented  x 3 at the time of the examination. The patient has apparent normal recent and remote memory, with an apparently normal attention span and concentration ability.   Speech is normal.  Cranial nerves: Extraocular movements are full.   Facial symmetry is present. There is good facial sensation to soft touch bilaterally.   Facial strength is normal.  Trapezius and sternocleidomastoid strength is normal. No dysarthria is noted.  The tongue is midline, and the patient has symmetric elevation of the soft palate.   Motor:  Muscle bulk is normal and tone is minimally increased in both legs. Strength is  5 / 5 in all 4 extremities.   Sensory: Sensory testing is intact to soft touch and vibration sensation on all 4 extremities.  Coordination: Cerebellar testing reveals good finger-nose-finger bilaterally.  Gait and station: Station is normal and gait is slightly wide. Tandem gait is wide. Romberg is negative.   Reflexes: Deep tendon reflexes are symmetric and brisk bilaterally with spread a the knees.      DIAGNOSTIC DATA (LABS, IMAGING, TESTING) - I reviewed patient records, labs, notes, testing and imaging myself where available.  Lab Results  Component Value Date   WBC 8.9 09/13/2016   HGB 13.5 09/13/2016   HCT 40.3 09/13/2016   MCV 90.2 09/13/2016   PLT 236 09/13/2016      Component Value Date/Time   NA 138 09/13/2016 1916   NA 140 11/11/2015   K 3.7 09/13/2016 1916   CL 103 09/13/2016 1916   CO2 26 09/13/2016 1916   GLUCOSE 81 09/13/2016 1916   BUN 18 09/13/2016 1916   BUN 15 11/11/2015   CREATININE 0.85 09/13/2016 1916   CREATININE 0.65 09/24/2015 1149   CALCIUM 8.9 09/13/2016 1916   PROT 6.9 09/24/2015 1149   ALBUMIN 4.4 09/24/2015 1149   AST 29 11/11/2015   ALT 48 (A) 11/11/2015   ALKPHOS 77 11/11/2015   BILITOT 0.6 09/24/2015 1149   GFRNONAA >60 09/13/2016 1916   GFRAA >60 09/13/2016 1916   Lab Results  Component Value Date   CHOL 196 12/25/2014   HDL 92 12/25/2014     LDLCALC 91 12/25/2014   TRIG 65 12/25/2014   CHOLHDL 2.1 12/25/2014   Lab Results  Component Value Date   HGBA1C  03/10/2010    5.5 (NOTE) The ADA recommends the following therapeutic goal for glycemic control related to Hgb A1c measurement: Goal of therapy: <6.5 Hgb A1c  Reference: American Diabetes Association: Clinical Practice Recommendations 2010, Diabetes Care, 2010, 33: (Suppl  1).      ASSESSMENT AND PLAN  Multiple sclerosis (HCC) - Plan: CBC with Differential/Platelet, MR BRAIN W WO CONTRAST  Abnormality of gait - Plan: MR BRAIN W WO CONTRAST  Chronic fatigue  Urinary hesitancy  Dysesthesia  Attention deficit disorder, unspecified hyperactivity presence  Disturbed cognition - Plan: MR BRAIN W WO CONTRAST   1.   She will continue Tecfidera. I will check a CBC. Additionally since she has had more symptoms, we will obtain an MRI of the brain to determine if there is any subclinical progression of her MS. If present, we will consider a different disease modifying therapy such as Lemtrada or ocrelizumab. 2.  Refill Ritalin (brand), hydrocodone.   Continue other med's     3.   Remain active, try to exercise 4.   rtc 4-5 months, sooner if problems.  We will call her with the results of the MRI and bring her in sooner if we need to make changes in therapy   Loreta Blouch A. Epimenio Foot, MD, PhD 10/19/2016, 10:29 AM Certified in Neurology, Clinical Neurophysiology, Sleep Medicine, Pain Medicine and Neuroimaging  [facility] [address], [address] [place], [place] [address] [phone] '

## 2016-10-20 ENCOUNTER — Telehealth: Payer: Self-pay | Admitting: *Deleted

## 2016-10-20 LAB — CBC WITH DIFFERENTIAL/PLATELET
Basophils Absolute: 0 10*3/uL (ref 0.0–0.2)
Basos: 1 %
EOS (ABSOLUTE): 0 10*3/uL (ref 0.0–0.4)
Eos: 0 %
Hematocrit: 41.3 % (ref 34.0–46.6)
Hemoglobin: 13.9 g/dL (ref 11.1–15.9)
Immature Grans (Abs): 0 10*3/uL (ref 0.0–0.1)
Immature Granulocytes: 0 %
Lymphocytes Absolute: 1 10*3/uL (ref 0.7–3.1)
Lymphs: 34 %
MCH: 30.3 pg (ref 26.6–33.0)
MCHC: 33.7 g/dL (ref 31.5–35.7)
MCV: 90 fL (ref 79–97)
Monocytes Absolute: 0.4 10*3/uL (ref 0.1–0.9)
Monocytes: 13 %
Neutrophils Absolute: 1.6 10*3/uL (ref 1.4–7.0)
Neutrophils: 52 %
Platelets: 251 10*3/uL (ref 150–379)
RBC: 4.59 x10E6/uL (ref 3.77–5.28)
RDW: 13.7 % (ref 12.3–15.4)
WBC: 3 10*3/uL — ABNORMAL LOW (ref 3.4–10.8)

## 2016-10-20 NOTE — Telephone Encounter (Signed)
-----   Message from Asa Lenteichard A [name], MD sent at 10/20/2016  8:37 AM EST ----- Please let her know that the blood work was fine.

## 2016-10-20 NOTE — Telephone Encounter (Signed)
I have spoken with [name] this afternoon and per RAS, advised that labwork done in our office looks good--she should continue Tecfdera as rx'd.  She verbalized understanding of same/fim

## 2016-10-25 ENCOUNTER — Encounter: Payer: Self-pay | Admitting: Internal Medicine

## 2016-10-25 ENCOUNTER — Ambulatory Visit (INDEPENDENT_AMBULATORY_CARE_PROVIDER_SITE_OTHER): Payer: Commercial Managed Care - HMO | Admitting: Internal Medicine

## 2016-10-25 VITALS — BP 144/82 | HR 76 | Temp 97.6°F | Ht 65.0 in | Wt 134.0 lb

## 2016-10-25 DIAGNOSIS — G35 Multiple sclerosis: Secondary | ICD-10-CM | POA: Diagnosis not present

## 2016-10-25 DIAGNOSIS — J209 Acute bronchitis, unspecified: Secondary | ICD-10-CM | POA: Diagnosis not present

## 2016-10-25 MED ORDER — HYDROCODONE-HOMATROPINE 5-1.5 MG/5ML PO SYRP: 5.0000 mL | ORAL_SOLUTION | Freq: Three times a day (TID) | ORAL | 0 refills | Status: DC | PRN

## 2016-10-25 MED ORDER — METHYLPREDNISOLONE ACETATE 80 MG/ML IJ SUSP
80.0000 mg | Freq: Once | INTRAMUSCULAR | Status: AC
  Administered 2016-10-25: 80 mg via INTRAMUSCULAR

## 2016-10-25 MED ORDER — AZITHROMYCIN 250 MG PO TABS: ORAL_TABLET | ORAL | 0 refills | Status: DC

## 2016-10-25 MED ORDER — CEFTRIAXONE SODIUM 1 G IJ SOLR
1.0000 g | Freq: Once | INTRAMUSCULAR | Status: AC
  Administered 2016-10-25: 1 g via INTRAMUSCULAR

## 2016-10-25 NOTE — Progress Notes (Signed)
   Subjective:    Patient ID: [name], female    DOB: 04/09/1961, 55 y.o.   MRN: [id number]  HPI    Review of Systems     Objective:   Physical Exam        Assessment & Plan:

## 2016-10-25 NOTE — Patient Instructions (Signed)
Depo-Medrol 80 mg IM. Rocephin 1 g IM. Take Zithromax Z-PAK as directed. Rest and drink plenty of fluids. Hycodan 1 teaspoon by mouth every 8 hours when necessary cough if needed.

## 2016-10-25 NOTE — Progress Notes (Signed)
   Subjective:    Patient ID: [name], female    DOB: 07-18-1961, 55 y.o.   MRN: [id number]  HPI 55 year old Female with multiple sclerosis had onset of URI symptoms a day or 2 after her November 15 appointment with neurologist. She has had slight sore throat cough and congestion. Has malaise and fatigue. Is worried about pneumonia. Had right middle lobe pneumonia June 2016. Thinks she caught respiratory infection from grandchildren. Has had some discolored sputum production with coughing. No fever or shaking chills. Patient says she benefits from IM injections. At one point we gave her both IM Rocephin and I am Depo-Medrol for respiratory infection which helped she says   Review of Systems see above     Objective:   Physical Exam She looks fatigued. Skin warm and dry. Left TM slightly full. Right TM obscured by cerumen.  Neck is supple. No significant adenopathy. Chest clear to auscultation without rales or wheezing.       Assessment & Plan:  Acute bronchitis  Plan: Depo-Medrol 80 mg IM. This should help MS symptoms. Rocephin 1 g IM. Zithromax Z-Pak take 2 tablets day one followed by 1 tablet days 2 through 5. Hycodan 1 teaspoon by mouth every 8 hours when necessary cough. Rest and drink plenty of fluids.

## 2016-11-01 ENCOUNTER — Telehealth: Payer: Self-pay | Admitting: Neurology

## 2016-11-01 NOTE — Telephone Encounter (Signed)
I have spoken with [employer].  MRI brain with and without contrast approved.  Approval # [id number].  Approval good for 45 calendar days, expires on 12-16-16./fim

## 2016-11-01 NOTE — Telephone Encounter (Signed)
[facility] and they didn't approve the MR Brain w/wo contrast. It is requiring a peer to peer. The case number is [id number] and the phone number is [phone] select option 3. Please do this as soon as you can because it is going to run out.

## 2016-11-01 NOTE — Telephone Encounter (Signed)
Noted, thank you for you help.

## 2016-11-02 ENCOUNTER — Ambulatory Visit (INDEPENDENT_AMBULATORY_CARE_PROVIDER_SITE_OTHER): Payer: Commercial Managed Care - HMO

## 2016-11-02 DIAGNOSIS — G35D Multiple sclerosis, unspecified: Secondary | ICD-10-CM

## 2016-11-02 DIAGNOSIS — R269 Unspecified abnormalities of gait and mobility: Secondary | ICD-10-CM

## 2016-11-02 DIAGNOSIS — R4189 Other symptoms and signs involving cognitive functions and awareness: Secondary | ICD-10-CM

## 2016-11-02 DIAGNOSIS — G35 Multiple sclerosis: Secondary | ICD-10-CM

## 2016-11-03 MED ORDER — GADOPENTETATE DIMEGLUMINE 469.01 MG/ML IV SOLN: 12.0000 mL | Freq: Once | INTRAVENOUS | Status: DC | PRN

## 2016-11-08 ENCOUNTER — Telehealth: Payer: Self-pay | Admitting: *Deleted

## 2016-11-08 NOTE — Telephone Encounter (Signed)
I have spoken with Elnita MaxwellCheryl this morning and per RAS, advised that MRI brain shows no new MS changes.  She verbalized understanding of same/fim

## 2016-11-08 NOTE — Telephone Encounter (Signed)
-----   Message from Asa Lente, MD sent at 11/04/2016 12:06 PM EST ----- Please let her know that the MRI of the brain is unchanged

## 2016-11-16 ENCOUNTER — Telehealth: Payer: Self-pay | Admitting: Neurology

## 2016-11-16 MED ORDER — METHYLPHENIDATE HCL 20 MG PO TABS: ORAL_TABLET | ORAL | 0 refills | Status: DC

## 2016-11-16 MED ORDER — HYDROCODONE-ACETAMINOPHEN 7.5-325 MG PO TABS: 2.0000 | ORAL_TABLET | Freq: Three times a day (TID) | ORAL | 0 refills | Status: DC | PRN

## 2016-11-16 NOTE — Addendum Note (Signed)
Addended by: Candis SchatzMISENHEIMER, Lundynn Cohoon I on: 11/16/2016 02:17 PM   Modules accepted: Orders

## 2016-11-16 NOTE — Telephone Encounter (Signed)
Hydrocodone and Ritalin rx's up front [facility]/fim 

## 2016-11-16 NOTE — Telephone Encounter (Signed)
Rx's awaiting RAS sig/fim 

## 2016-11-16 NOTE — Telephone Encounter (Signed)
Patient requesting refill of HYDROcodone-acetaminophen (NORCO) 7.5-325 MG tablet and methylphenidate (RITALIN) 20 MG tablet. I advised the Rx's will be ready in 24 hours unless the nurse advises.

## 2016-12-15 ENCOUNTER — Telehealth: Payer: Self-pay | Admitting: Neurology

## 2016-12-15 MED ORDER — HYDROCODONE-ACETAMINOPHEN 7.5-325 MG PO TABS: 2.0000 | ORAL_TABLET | Freq: Three times a day (TID) | ORAL | 0 refills | Status: DC | PRN

## 2016-12-15 MED ORDER — METHYLPHENIDATE HCL 20 MG PO TABS: ORAL_TABLET | ORAL | 0 refills | Status: DC

## 2016-12-15 NOTE — Telephone Encounter (Signed)
Pt request refill for HYDROcodone-acetaminophen (NORCO) 7.5-325 MG tablet . She also said insurance needs PA for methylphenidate (RITALIN) 20 MG tablet BN. Pt is aware office closes at noon tomorrow

## 2016-12-15 NOTE — Telephone Encounter (Signed)
Rx's awaiting RAS sig/fim 

## 2016-12-15 NOTE — Telephone Encounter (Signed)
Hydrocodone and Ritalin rx's up front [facility]/fim 

## 2017-01-16 ENCOUNTER — Telehealth: Payer: Self-pay | Admitting: Neurology

## 2017-01-16 MED ORDER — METHYLPHENIDATE HCL 20 MG PO TABS: ORAL_TABLET | ORAL | 0 refills | Status: DC

## 2017-01-16 MED ORDER — HYDROCODONE-ACETAMINOPHEN 7.5-325 MG PO TABS: 2.0000 | ORAL_TABLET | Freq: Three times a day (TID) | ORAL | 0 refills | Status: DC | PRN

## 2017-01-16 NOTE — Telephone Encounter (Signed)
Patient called office requesting refill for methylphenidate (RITALIN) 20 MG tablet (name brand not generic) and HYDROcodone-acetaminophen (NORCO) 7.5-325 MG tablet

## 2017-01-16 NOTE — Telephone Encounter (Signed)
Rx's up front [facility]/fim 

## 2017-01-16 NOTE — Addendum Note (Signed)
Addended by: Candis Schatz I on: 01/16/2017 04:22 PM   Modules accepted: Orders

## 2017-01-16 NOTE — Telephone Encounter (Signed)
Rx's awaiting RAS sig/fim 

## 2017-02-08 ENCOUNTER — Encounter: Payer: Self-pay | Admitting: Neurology

## 2017-02-08 ENCOUNTER — Ambulatory Visit (INDEPENDENT_AMBULATORY_CARE_PROVIDER_SITE_OTHER): Payer: Commercial Managed Care - HMO | Admitting: Neurology

## 2017-02-08 VITALS — BP 144/89 | HR 69 | Resp 16 | Ht 65.0 in | Wt 136.5 lb

## 2017-02-08 DIAGNOSIS — R3911 Hesitancy of micturition: Secondary | ICD-10-CM

## 2017-02-08 DIAGNOSIS — F4323 Adjustment disorder with mixed anxiety and depressed mood: Secondary | ICD-10-CM

## 2017-02-08 DIAGNOSIS — R269 Unspecified abnormalities of gait and mobility: Secondary | ICD-10-CM

## 2017-02-08 DIAGNOSIS — G35 Multiple sclerosis: Secondary | ICD-10-CM

## 2017-02-08 DIAGNOSIS — B0229 Other postherpetic nervous system involvement: Secondary | ICD-10-CM | POA: Diagnosis not present

## 2017-02-08 DIAGNOSIS — F988 Other specified behavioral and emotional disorders with onset usually occurring in childhood and adolescence: Secondary | ICD-10-CM

## 2017-02-08 DIAGNOSIS — R5382 Chronic fatigue, unspecified: Secondary | ICD-10-CM | POA: Diagnosis not present

## 2017-02-08 MED ORDER — HYDROCODONE-ACETAMINOPHEN 7.5-325 MG PO TABS: 2.0000 | ORAL_TABLET | Freq: Three times a day (TID) | ORAL | 0 refills | Status: DC | PRN

## 2017-02-08 MED ORDER — METHYLPHENIDATE HCL 20 MG PO TABS: ORAL_TABLET | ORAL | 0 refills | Status: DC

## 2017-02-08 NOTE — Progress Notes (Signed)
[facility]  PATIENT: [name] DOB: August 23, 1961 REASON FOR VISIT: Multiple sclerosis   HISTORICAL  CHIEF COMPLAINT:  Chief Complaint  Patient presents with  . Multiple Sclerosis    Sts. she continues to tolerate Tecfidera with some flushing, but it is tolerable./fim    HISTORY OF PRESENT ILLNESS:  [name] is a 56 year old woman with multiple sclerosis diagnosed in 2008.    MS:   She is on Tecfidera and denies exacerbation and she tolerates it well.   Last lymphocyte count was 1.0 3 months ago.    She notes cognitive and physical fatigue are bad and today is a worse than typical day..         Night sweats:   She has a lot of sweating at night - mostly chest, back and neck.   She is trying to eat better and has reduced her sodium intake   Gait/Strength/sensation:   She has reduced balance that affects her gait and she also has a right foot drop and drags her foot when tired.   She has no recent falls.    She also has right greater than left leg clumsiness.  She uses a cane rarely..  She occ shops with a scooter.    She has dysesthesias in her feet and milder in her hands.    Pain is burning and left > right up to the mid shin.    She gets a tight sensation and a poking sensation in the thoracic spine region.   Baclofen and tizanidine were not well tolerated.  PHN:   She had shingles at the sternum and down left arm in December 2016 after her knee surgery.   She now takes only 600 mg gabapentin at night as higher doses were not well tolerated.  She is also on hydrocodone up to 6/day.   Shingles occurred at time of left knee surgery  Bladder:   She has bladder dysfunction and occasional mild incontinence (wears Poise pad).  Tamsulosin  was poorly tolerated.  She has seen urology.     Vision:    She is doing about the same with occasional diplopia, worse when tired.   An early exacerbation was diplopia.    No h/o optic neuritis.   No color asymmetry.    Fatigue/sleep:   She has physcical and cognitive fatigue, both helped by Ritalin.  Fatigue is worse in the heat.   She also has difficulties with attention. Attentional problems improved when she takes Ritalin.   Brand was much more effective for her than generic.    Sleep is better on gabapentin at night.   Xanax help sleep onset.   She has nocturia x 4 some night but usually falls back asleep  Mood/cognition:  She has a mild depression and mild anxiety.    She had a severe depression around 2010 and was on an antidepressant but did not tolerate it well.  Hermild anxiety, helped by  Xanax .   She notes a brain fog at times and  Ritalin has helped a lot and she tolerates it well.      She has some word finding problems and decreased attention.    MS History:  In 2008, she had an MRI of the brain performed which was consistent with multiple sclerosis and she was diagnosed with relapsing remitting MS. He was initially placed on Avonex for therapy. She had some exacerbations with weakness and clumsiness with her gait and she started to  see Dr. Leotis Shames. He placed her on Tysabri she is on Tysabri between 2010 and 2013. 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      REVIEW OF SYSTEMS:  Constitutional: No fevers, chills, sweats, or change in appetite.  She notes fatigue Eyes: No visual changes,eye pain.   She has intermittent diplopia Ear, nose and throat: No hearing loss, ear pain, nasal congestion, sore throat Cardiovascular: No chest pain, palpitations Respiratory:  No shortness of breath at rest or with exertion.   No wheezes GastrointestinaI: No nausea, vomiting, diarrhea, abdominal pain, fecal incontinence Genitourinary:  a sabove Musculoskeletal:  she has both neck pain and back pain. Also bilat knee pain Integumentary: No rash, pruritus, skin lesions.   She has Raynauds in her hands  Neurological: as  above Psychiatric: No depression at this time.  Anxiety doing ok on med's Endocrine: No palpitations, diaphoresis, change in appetite, change in weigh or increased thirst Hematologic/Lymphatic:  No anemia, purpura, petechiae. Allergic/Immunologic: No itchy/runny eyes, nasal congestion, recent allergic reactions, rashes  ALLERGIES: Allergies  Allergen Reactions  . Ibuprofen Other (See Comments)    CAUSES LYMPHEDEMA  . Nsaids Other (See Comments)    AVOIDS ANY SODIUM CONTAINING MED. -- CAUSES LYMPHEDEMA  . Other Swelling and Other (See Comments)    Laxatives- causes leg swelling    HOME MEDICATIONS: Outpatient Medications Prior to Visit  Medication Sig Dispense Refill  . ALPRAZolam (XANAX) 0.5 MG tablet TAKE 1 TABLET BY MOUTH 3 TIMES A DAY AS NEEDED FOR ANXIETY OR SLEEP (Patient taking differently: TAKE 1 TABLET BY MOUTH EVERY NIGHT) 90 tablet 5  . CALCIUM-MAG-VIT C-VIT D PO Take 1 tablet by mouth daily.    . Dimethyl Fumarate (TECFIDERA) 240 MG CPDR Take 1 capsule (240 mg total) by mouth 2 (two) times daily. 180 capsule 3  . gabapentin (NEURONTIN) 600 MG tablet Take 1 tablet (600 mg total) by mouth at bedtime.    . hyoscyamine (LEVSIN SL) 0.125 MG SL tablet Place 1 tablet (0.125 mg total) under the tongue every 4 (four) hours as needed. 90 tablet 5  . Magnesium 200 MG TABS Take 200 mg by mouth daily.    . metoprolol (LOPRESSOR) 50 MG tablet Take 0.5 tablets (25 mg total) by mouth 2 (two) times daily as needed (racing heartbeat). 30 tablet 0  . NON FORMULARY Take 1 tablet by mouth See admin instructions. MALIC ACID-TAKES 1 TAB IN EVENING AND 1 TAB AT BEDTIME    . HYDROcodone-acetaminophen (NORCO) 7.5-325 MG tablet Take 2 tablets by mouth every 8 (eight) hours as needed. 180 tablet 0  . methylphenidate (RITALIN) 20 MG tablet 40 mg in the morning and 20 mg with lunch. 90 tablet 0  . azithromycin (ZITHROMAX) 250 MG tablet 2 po day 1 followed by one po days 2-5 6 tablet 0  .  HYDROcodone-homatropine (HYCODAN) 5-1.5 MG/5ML syrup Take 5 mLs by mouth every 8 (eight) hours as needed for cough. 120 mL 0   Facility-Administered Medications Prior to Visit  Medication Dose Route Frequency Provider Last Rate Last Dose  . gadopentetate dimeglumine (MAGNEVIST) injection 12 mL  12 mL Intravenous Once PRN Asa Lente, MD      . gadopentetate dimeglumine (MAGNEVIST) injection 15 mL  15 mL Intravenous Once PRN Asa Lente, MD        PAST MEDICAL HISTORY: Past Medical History:  Diagnosis  Date  . ADD (attention deficit disorder)   . Anxiety   . Arthritis   . Bladder incontinence    wears a pad  . Bone spur    cervical bone spurs  . Chronic fatigue   . Dysrhythmia    hx A-FIB  . 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 Headache(784.0)   . History of concussion YOUNG ADULT--  NO RESIDUAL  . IBS (irritable bowel syndrome)   . Movement disorder   . MS (multiple sclerosis) (HCC)   . MVP (mitral valve prolapse) HX HEART RACING  YRS AGO   ASYMPTOMATIC SINCE THEN  . Vision abnormalities   . Weakness of right leg SECONDARY TO MS    PAST SURGICAL HISTORY: Past Surgical History:  Procedure Laterality Date  . CESAREAN SECTION  X3   BILATERAL TUBAL LIGATION W/ LAST ONE  . DESTRUCTION OF ANAL CONDYLOMA  07-07-2005  DR TOTH  . HERNIA REPAIR    . KNEE ARTHROSCOPY WITH LATERAL MENISECTOMY  10/26/2012   Procedure: KNEE ARTHROSCOPY WITH LATERAL MENISECTOMY;  Surgeon: Javier Docker, MD;  Location: [facility];  Service: Orthopedics;  Laterality: Left;  Left Knee Arthrsocopy with Partial Lateral Menisectomy  . LAPAROSCOPIC ASSISTED VAGINAL HYSTERECTOMY  01-15-2002  DR Gerlene Burdock HOLLAND/ DR TIM DAVIS   AND REPAIR VENTRAL HERNIA  . LASER ABLATION CONDOLAMATA N/A 04/26/2013   Procedure: LASER ABLATION CONDOLAMATA;  Surgeon: Romie Levee, MD;  Location: [facility];  Service: General;  Laterality: N/A;  . TONSILLECTOMY    . TOTAL KNEE  ARTHROPLASTY Left 11/05/2015   Procedure: LEFT TOTAL KNEE ARTHROPLASTY;  Surgeon: Jene Every, MD;  Location: [facility];  Service: Orthopedics;  Laterality: Left;    FAMILY HISTORY: Family History  Problem Relation Age of Onset  . Diabetes Mother   . Hypertension Mother   . Stroke Mother   . Bowel Disease Brother   . Diabetes Brother   . Healthy Brother   . Emphysema Father   . Heart attack Father     SOCIAL HISTORY:  Social History   Social History  . Marital status: Married    Spouse name: N/A  . Number of children: 3  . Years of education: N/A   Occupational History  . Not on file.   Social History Main Topics  . Smoking status: Never Smoker  . Smokeless tobacco: Never Used  . Alcohol use No  . Drug use: No  . Sexual activity: Not on file   Other Topics Concern  . Not on file   Social History Narrative  . No narrative on file     PHYSICAL EXAM  Vitals:   02/08/17 1119  BP: (!) 144/89  Pulse: 69  Resp: 16  Weight: 136 lb 8 oz (61.9 kg)  Height: 5\' 5"  (1.651 m)    Body mass index is 22.71 kg/m.   General: The patient is well-developed and well-nourished and in no acute distress   Neurologic Exam  Mental status: The patient is alert and oriented x 3 at the time of the examination. The patient has apparent normal recent and remote memory, with an apparently normal attention span and concentration ability.   Speech is normal.  Cranial nerves: Extraocular movements are full.   Facial symmetry is present. There is good facial sensation to soft touch bilaterally.   Facial strength is normal.  Trapezius and sternocleidomastoid strength is normal. No dysarthria is noted.  The tongue is midline, and  the patient has symmetric elevation of the soft palate.   Motor:  Muscle bulk is normal and tone is minimally increased in both legs. Strength is  5 / 5 in all 4 extremities.   Sensory: Sensory testing is intact to soft touch and vibration sensation on all 4  extremities.  Coordination: Cerebellar testing reveals good finger-nose-finger bilaterally.  Gait and station: Station is normal and gait is slightly wide. Tandem gait is wide. Romberg is negative.   Reflexes: Deep tendon reflexes are symmetric and brisk bilaterally with spread a the knees but no ankle clonus.         DIAGNOSTIC DATA (LABS, IMAGING, TESTING) - I reviewed patient records, labs, notes, testing and imaging myself where available.  Lab Results  Component Value Date   WBC 3.0 (L) 10/19/2016   HGB 13.5 09/13/2016   HCT 41.3 10/19/2016   MCV 90 10/19/2016   PLT 251 10/19/2016      Component Value Date/Time   NA 138 09/13/2016 1916   NA 140 11/11/2015   K 3.7 09/13/2016 1916   CL 103 09/13/2016 1916   CO2 26 09/13/2016 1916   GLUCOSE 81 09/13/2016 1916   BUN 18 09/13/2016 1916   BUN 15 11/11/2015   CREATININE 0.85 09/13/2016 1916   CREATININE 0.65 09/24/2015 1149   CALCIUM 8.9 09/13/2016 1916   PROT 6.9 09/24/2015 1149   ALBUMIN 4.4 09/24/2015 1149   AST 29 11/11/2015   ALT 48 (A) 11/11/2015   ALKPHOS 77 11/11/2015   BILITOT 0.6 09/24/2015 1149   GFRNONAA >60 09/13/2016 1916   GFRAA >60 09/13/2016 1916   Lab Results  Component Value Date   CHOL 196 12/25/2014   HDL 92 12/25/2014   LDLCALC 91 12/25/2014   TRIG 65 12/25/2014   CHOLHDL 2.1 12/25/2014   Lab Results  Component Value Date   HGBA1C  03/10/2010    5.5 (NOTE) The ADA recommends the following therapeutic goal for glycemic control related to Hgb A1c measurement: Goal of therapy: <6.5 Hgb A1c  Reference: American Diabetes Association: Clinical Practice Recommendations 2010, Diabetes Care, 2010, 33: (Suppl  1).      ASSESSMENT AND PLAN  Multiple sclerosis (HCC)  PHN (postherpetic neuralgia)  Abnormality of gait  Chronic fatigue  Attention deficit disorder, unspecified hyperactivity presence  Adjustment disorder with mixed anxiety and depressed mood  Urinary hesitancy  1.   She  will continue Tecfidera. Recent lymphocyte count was ok at 1.0.    Will check again at next visit 2.   Refill Ritalin (prefers brand, hydrocodone.   Continue other med's     3.   Remain active, try to exercise.  Ok for [employer] 4.   rtc 4-5 months, sooner if problems.  We will call her with the results of the MRI and bring her in sooner if we need to make changes in therapy   Richard A. Epimenio Foot, MD, PhD 02/08/2017, 1:02 PM Certified in Neurology, Clinical Neurophysiology, Sleep Medicine, Pain Medicine and Neuroimaging  [facility] [address] [place], [place] [address] [phone]

## 2017-03-16 ENCOUNTER — Telehealth: Payer: Self-pay | Admitting: Neurology

## 2017-03-16 NOTE — Telephone Encounter (Signed)
I have spoken with [name] today.  She c/o increased fatigue over the last 2 weeks, some jaw pain, which is not new.  Appt. given with Darrol Angel tomorrow am/fim

## 2017-03-16 NOTE — Telephone Encounter (Signed)
Patient is having MS issues, headache and fatigue more than usual for the past week and a half.. Sometimes she is having a hard time moving her mouth.

## 2017-03-17 ENCOUNTER — Ambulatory Visit (INDEPENDENT_AMBULATORY_CARE_PROVIDER_SITE_OTHER): Payer: Commercial Managed Care - HMO | Admitting: Nurse Practitioner

## 2017-03-17 ENCOUNTER — Encounter: Payer: Self-pay | Admitting: Nurse Practitioner

## 2017-03-17 VITALS — BP 123/82 | HR 76 | Wt 136.6 lb

## 2017-03-17 DIAGNOSIS — F988 Other specified behavioral and emotional disorders with onset usually occurring in childhood and adolescence: Secondary | ICD-10-CM

## 2017-03-17 DIAGNOSIS — R3911 Hesitancy of micturition: Secondary | ICD-10-CM

## 2017-03-17 DIAGNOSIS — F4323 Adjustment disorder with mixed anxiety and depressed mood: Secondary | ICD-10-CM

## 2017-03-17 DIAGNOSIS — R269 Unspecified abnormalities of gait and mobility: Secondary | ICD-10-CM

## 2017-03-17 DIAGNOSIS — G8929 Other chronic pain: Secondary | ICD-10-CM

## 2017-03-17 DIAGNOSIS — R51 Headache: Secondary | ICD-10-CM | POA: Diagnosis not present

## 2017-03-17 DIAGNOSIS — R519 Headache, unspecified: Secondary | ICD-10-CM

## 2017-03-17 DIAGNOSIS — R5382 Chronic fatigue, unspecified: Secondary | ICD-10-CM | POA: Diagnosis not present

## 2017-03-17 DIAGNOSIS — G35 Multiple sclerosis: Secondary | ICD-10-CM | POA: Diagnosis not present

## 2017-03-17 NOTE — Progress Notes (Signed)
[facility]  PATIENT: [name] DOB: Jun 28, 1961   REASON FOR VISIT: follow up for MS HISTORY FROM: Patient alone at visitvisit    HISTORY OF PRESENT ILLNESS: UPDATE 03/17/17 CM Ms  Screws is a 56 year old woman with multiple sclerosis diagnosed in 2008.She is currently on tecfidera and claims she is tolerating it well and has occasional hot flash or flushing. On today's visit she notes fatigue is much worse over the last 2 weeks. She complains of headache in the right temporal area and she says 'I know something is really wrong with me" She complains with  jaw pain   She has a depressed affect   and it is difficult to obtain history. She claims that she falls frequently, she has a right foot drop. She is not using an assistive device today. She has dysesthesias in her feet. She has nocturia gets up at night 4-5 times. She says she has had bladder dysfunction for years. She denies any diplopia. She has been on Ritalin for her chronic fatigue however this is not been beneficial in the last couple of weeks even though she is taking her medication. She has some word finding problems when asking questions and decreased attention . She returns for reevaluation    REVIEW OF SYSTEMS: Full 14 system review of systems performed and notable only for those listed, all others are neg:  Constitutional: Increased fatigue Cardiovascular : neg Ear/Nose/Throat: neg  Skin: neg Eyes: neg Respiratory: neg Gastroitestinal: neg  Hematology/Lymphatic: neg  Endocrine: neg MusculoskeletalWalking difficulty Allergy/Immunology: neg Neurological:Headache Psychiatric  appears depressed however denies this  Sleep : neg   ALLERGIES: Allergies  Allergen Reactions  . Ibuprofen Other (See Comments)    CAUSES LYMPHEDEMA  . Nsaids Other (See Comments)    AVOIDS ANY SODIUM CONTAINING MED. -- CAUSES LYMPHEDEMA  . Other Swelling and Other (See Comments)    Laxatives- causes leg swelling      HOME MEDICATIONS: Outpatient Medications Prior to Visit  Medication Sig Dispense Refill  . ALPRAZolam (XANAX) 0.5 MG tablet TAKE 1 TABLET BY MOUTH 3 TIMES A DAY AS NEEDED FOR ANXIETY OR SLEEP (Patient taking differently: TAKE 1 TABLET BY MOUTH EVERY NIGHT) 90 tablet 5  . CALCIUM-MAG-VIT C-VIT D PO Take 1 tablet by mouth daily.    . Dimethyl Fumarate (TECFIDERA) 240 MG CPDR Take 1 capsule (240 mg total) by mouth 2 (two) times daily. 180 capsule 3  . gabapentin (NEURONTIN) 600 MG tablet Take 1 tablet (600 mg total) by mouth at bedtime.    Marland Kitchen HYDROcodone-acetaminophen (NORCO) 7.5-325 MG tablet Take 2 tablets by mouth every 8 (eight) hours as needed. 180 tablet 0  . hyoscyamine (LEVSIN SL) 0.125 MG SL tablet Place 1 tablet (0.125 mg total) under the tongue every 4 (four) hours as needed. 90 tablet 5  . Magnesium 200 MG TABS Take 200 mg by mouth daily.    . methylphenidate (RITALIN) 20 MG tablet 40 mg in the morning and 20 mg with lunch. 90 tablet 0  . metoprolol (LOPRESSOR) 50 MG tablet Take 0.5 tablets (25 mg total) by mouth 2 (two) times daily as needed (racing heartbeat). 30 tablet 0  . NON FORMULARY Take 1 tablet by mouth See admin instructions. MALIC ACID-TAKES 1 TAB IN EVENING AND 1 TAB AT BEDTIME     Facility-Administered Medications Prior to Visit  Medication Dose Route Frequency Provider Last Rate Last Dose  . gadopentetate dimeglumine (MAGNEVIST) injection 12 mL  12 mL Intravenous Once  PRN Asa Lente, MD      . gadopentetate dimeglumine (MAGNEVIST) injection 15 mL  15 mL Intravenous Once PRN Asa Lente, MD        PAST MEDICAL HISTORY: Past Medical History:  Diagnosis Date  . ADD (attention deficit disorder)   . Anxiety   . Arthritis   . Bladder incontinence    wears a pad  . Bone spur    cervical bone spurs  . Chronic fatigue   . Dysrhythmia    hx A-FIB  . 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 Headache(784.0)   . History of concussion YOUNG  ADULT--  NO RESIDUAL  . IBS (irritable bowel syndrome)   . Movement disorder   . MS (multiple sclerosis) (HCC)   . MVP (mitral valve prolapse) HX HEART RACING  YRS AGO   ASYMPTOMATIC SINCE THEN  . Vision abnormalities   . Weakness of right leg SECONDARY TO MS    PAST SURGICAL HISTORY: Past Surgical History:  Procedure Laterality Date  . CESAREAN SECTION  X3   BILATERAL TUBAL LIGATION W/ LAST ONE  . DESTRUCTION OF ANAL CONDYLOMA  07-07-2005  DR TOTH  . HERNIA REPAIR    . KNEE ARTHROSCOPY WITH LATERAL MENISECTOMY  10/26/2012   Procedure: KNEE ARTHROSCOPY WITH LATERAL MENISECTOMY;  Surgeon: Javier Docker, MD;  Location: [facility];  Service: Orthopedics;  Laterality: Left;  Left Knee Arthrsocopy with Partial Lateral Menisectomy  . LAPAROSCOPIC ASSISTED VAGINAL HYSTERECTOMY  01-15-2002  DR Gerlene Burdock HOLLAND/ DR TIM DAVIS   AND REPAIR VENTRAL HERNIA  . LASER ABLATION CONDOLAMATA N/A 04/26/2013   Procedure: LASER ABLATION CONDOLAMATA;  Surgeon: Romie Levee, MD;  Location: [facility];  Service: General;  Laterality: N/A;  . TONSILLECTOMY    . TOTAL KNEE ARTHROPLASTY Left 11/05/2015   Procedure: LEFT TOTAL KNEE ARTHROPLASTY;  Surgeon: Jene Every, MD;  Location: [facility];  Service: Orthopedics;  Laterality: Left;    FAMILY HISTORY: Family History  Problem Relation Age of Onset  . Diabetes Mother   . Hypertension Mother   . Stroke Mother   . Bowel Disease Brother   . Diabetes Brother   . Healthy Brother   . Emphysema Father   . Heart attack Father     SOCIAL HISTORY: Social History   Social History  . Marital status: Married    Spouse name: N/A  . Number of children: 3  . Years of education: N/A   Occupational History  . Not on file.   Social History Main Topics  . Smoking status: Never Smoker  . Smokeless tobacco: Never Used  . Alcohol use No  . Drug use: No  . Sexual activity: Not on file   Other Topics Concern  . Not on file    Social History Narrative  . No narrative on file     PHYSICAL EXAM  Vitals:   03/17/17 0820  BP: 123/82  Pulse: 76  Weight: 136 lb 9.6 oz (62 kg)   Body mass index is 22.73 kg/m.  Generalized: Well developed, in no acute distress  Appears depressed Head: normocephalic and atraumatic,. Oropharynx benign  Neck: Supple,   Neurological examination   Mentation: Alert oriented to time, place, history taking. Attention span and concentration appropriate. Recent and remote memory intact.  Follows all commands speech and language fluent.   Cranial nerve II-XII: Pupils were equal round reactive to light extraocular movements were full, visual field were full  on confrontational test. Facial sensation and strength were normal. hearing was intact to finger rubbing bilaterally. Uvula tongue midline. head turning and shoulder shrug were normal and symmetric.Tongue protrusion into cheek strength was normal. Motor: normal bulk and tone, full strength in the BUE, BLE,   Sensory: normal and symmetric to light touch, pinprick, and  Vibratin all 4 extremities Coordination  finger-nose-finger, heel-to-shin bilaterally, no dysmetria Reflexes Symmetric and brisk, no clonus plantar responses were flexor bilaterally. Gait and Station: Rising up from seated position without assistamildly unsteady gait obvious foot drop DIAGNOSTIC DATA (LABS, IMAGING, TESTING) - I reviewed patient records, labs, notes, testing and imaging myself where available.  Lab Results  Component Value Date   WBC 3.0 (L) 10/19/2016   HGB 13.5 09/13/2016   HCT 41.3 10/19/2016   MCV 90 10/19/2016   PLT 251 10/19/2016      ASSESSMENT AND PLAN  56 y.o. year old female  has a past medical history of ADD (attention deficit disorder); Anxiety;   Chronic fatigue;  Headache(784.0);  MS (multiple sclerosis) (HCC);  and Weakness of right leg (SECONDARY TO MS).  abnormality of gait, depressed mood  And temporal headache here to  follow-up.  She was seen in consultation with Dr. Epimenio Foot   Will obtain MRI of the brain for progression of MS  Check labs today, CBC CMP and TSH vitamin D and sedimentation rate  Solumedrol 1 gm today IVtoday  Continue current  meds F/U as planned A total of 45 min minutes was spent face-to-face with this patient. Over half this time was spent on reviewing the chart , diagnosis of MS and different diagnostic and therapeutic options available. Planning for further management options discussed with the patient Nilda Riggs, [facility], [facility], APRN  [facility] [address] [place], [place] [address] [phone]

## 2017-03-17 NOTE — Progress Notes (Signed)
I have read the note, and I agree with the clinical assessment and plan.   We will check labs, MRI and do one day of IV Solu-Medrol. Additional days of IV Solu-Medrol. 4. She has enhancing lesions on MRI.  Shabria Egley A. Epimenio Foot, MD, PhD Certified in Neurology, Clinical Neurophysiology, Sleep Medicine, Pain Medicine and Neuroimaging  [facility] [address], [address] [place], [place] [address] [phone]

## 2017-03-17 NOTE — Patient Instructions (Signed)
MRI of the brain Check labs today Solumedrol 1 gm today IV Continue current  meds F/U as planned

## 2017-03-18 ENCOUNTER — Ambulatory Visit (HOSPITAL_COMMUNITY)
Admission: RE | Admit: 2017-03-18 | Discharge: 2017-03-18 | Disposition: A | Discharge status: Home / Self Care | Payer: Commercial Managed Care - HMO | Source: Ambulatory Visit | Attending: Nurse Practitioner | Admitting: Nurse Practitioner

## 2017-03-18 DIAGNOSIS — R51 Headache: Secondary | ICD-10-CM | POA: Insufficient documentation

## 2017-03-18 DIAGNOSIS — G35 Multiple sclerosis: Secondary | ICD-10-CM | POA: Diagnosis present

## 2017-03-18 DIAGNOSIS — R5382 Chronic fatigue, unspecified: Secondary | ICD-10-CM | POA: Insufficient documentation

## 2017-03-18 DIAGNOSIS — G8929 Other chronic pain: Secondary | ICD-10-CM

## 2017-03-18 DIAGNOSIS — R519 Headache, unspecified: Secondary | ICD-10-CM

## 2017-03-18 LAB — COMPREHENSIVE METABOLIC PANEL
ALT: 24 IU/L (ref 0–32)
AST: 25 IU/L (ref 0–40)
Albumin/Globulin Ratio: 1.9 (ref 1.2–2.2)
Albumin: 4.8 g/dL (ref 3.5–5.5)
Alkaline Phosphatase: 91 IU/L (ref 39–117)
BUN/Creatinine Ratio: 20 (ref 9–23)
BUN: 19 mg/dL (ref 6–24)
Bilirubin Total: 0.6 mg/dL (ref 0.0–1.2)
CO2: 28 mmol/L (ref 18–29)
Calcium: 10 mg/dL (ref 8.7–10.2)
Chloride: 98 mmol/L (ref 96–106)
Creatinine, Ser: 0.93 mg/dL (ref 0.57–1.00)
GFR calc Af Amer: 78 mL/min/{1.73_m2} (ref >59)
GFR calc non Af Amer: 68 mL/min/{1.73_m2} (ref >59)
Globulin, Total: 2.5 g/dL (ref 1.5–4.5)
Glucose: 118 mg/dL — ABNORMAL HIGH (ref 65–99)
Potassium: 5.7 mmol/L — ABNORMAL HIGH (ref 3.5–5.2)
Sodium: 142 mmol/L (ref 134–144)
Total Protein: 7.3 g/dL (ref 6.0–8.5)

## 2017-03-18 LAB — TSH: TSH: 2.89 u[IU]/mL (ref 0.450–4.500)

## 2017-03-18 LAB — CBC WITH DIFFERENTIAL/PLATELET
Basophils Absolute: 0 10*3/uL (ref 0.0–0.2)
Basos: 0 %
EOS (ABSOLUTE): 0 10*3/uL (ref 0.0–0.4)
Eos: 0 %
Hematocrit: 44.7 % (ref 34.0–46.6)
Hemoglobin: 15 g/dL (ref 11.1–15.9)
Immature Grans (Abs): 0 10*3/uL (ref 0.0–0.1)
Immature Granulocytes: 0 %
Lymphocytes Absolute: 1.1 10*3/uL (ref 0.7–3.1)
Lymphs: 42 %
MCH: 30.7 pg (ref 26.6–33.0)
MCHC: 33.6 g/dL (ref 31.5–35.7)
MCV: 92 fL (ref 79–97)
Monocytes Absolute: 0.3 10*3/uL (ref 0.1–0.9)
Monocytes: 9 %
Neutrophils Absolute: 1.3 10*3/uL — ABNORMAL LOW (ref 1.4–7.0)
Neutrophils: 49 %
Platelets: 261 10*3/uL (ref 150–379)
RBC: 4.88 x10E6/uL (ref 3.77–5.28)
RDW: 13.8 % (ref 12.3–15.4)
WBC: 2.7 10*3/uL — ABNORMAL LOW (ref 3.4–10.8)

## 2017-03-18 LAB — SEDIMENTATION RATE: Sed Rate: 2 mm/hr (ref 0–40)

## 2017-03-18 LAB — VITAMIN D 25 HYDROXY (VIT D DEFICIENCY, FRACTURES): Vit D, 25-Hydroxy: 13.2 ng/mL — ABNORMAL LOW (ref 30.0–100.0)

## 2017-03-18 MED ORDER — GADOBENATE DIMEGLUMINE 529 MG/ML IV SOLN
15.0000 mL | Freq: Once | INTRAVENOUS | Status: AC | PRN
  Administered 2017-03-18: 12 mL via INTRAVENOUS

## 2017-03-21 ENCOUNTER — Telehealth: Payer: Self-pay | Admitting: Neurology

## 2017-03-21 MED ORDER — VITAMIN D (ERGOCALCIFEROL) 1.25 MG (50000 UNIT) PO CAPS: 50000.0000 [IU] | ORAL_CAPSULE | ORAL | 0 refills | Status: DC

## 2017-03-21 NOTE — Telephone Encounter (Signed)
I reviewed the results of the MRI (no change) with [name] Additionally I reviewed her lab work. Her vitamin D is very low at 100 take 50,000 units weekly for 6 months. Lymphocyte count was fine though her neutrophils were minimally low at 1.4 and the white blood cell count was 2.7.

## 2017-04-13 ENCOUNTER — Telehealth: Payer: Self-pay | Admitting: Neurology

## 2017-04-13 MED ORDER — METHYLPHENIDATE HCL 20 MG PO TABS: ORAL_TABLET | ORAL | 0 refills | Status: DC

## 2017-04-13 MED ORDER — ALPRAZOLAM 0.5 MG PO TABS: ORAL_TABLET | ORAL | 5 refills | Status: DC

## 2017-04-13 MED ORDER — HYOSCYAMINE SULFATE 0.125 MG SL SUBL: 0.1250 mg | SUBLINGUAL_TABLET | SUBLINGUAL | 3 refills | Status: DC | PRN

## 2017-04-13 MED ORDER — HYDROCODONE-ACETAMINOPHEN 7.5-325 MG PO TABS: 2.0000 | ORAL_TABLET | Freq: Three times a day (TID) | ORAL | 0 refills | Status: DC | PRN

## 2017-04-13 MED ORDER — GABAPENTIN 600 MG PO TABS: 600.0000 mg | ORAL_TABLET | Freq: Every day | ORAL | 3 refills | Status: DC

## 2017-04-13 NOTE — Addendum Note (Signed)
Addended by: Candis Schatz I on: 04/13/2017 12:20 PM   Modules accepted: Orders

## 2017-04-13 NOTE — Telephone Encounter (Signed)
Gabapentin and Hyoscyamine escribed to [facility].  Hydrocodone, Xanax and Ritalin rx's awaiting RAS sig/fim

## 2017-04-13 NOTE — Telephone Encounter (Signed)
Patient called office requesting refills for methylphenidate (RITALIN) 20 MG tablet, HYDROcodone-acetaminophen (NORCO) 7.5-325 MG tablet, ALPRAZolam (XANAX) 0.5 MG tablet, gabapentin (NEURONTIN) 600 MG tablet and hyoscyamine (LEVSIN SL) 0.125 MG SL tablet.  Pharmacy-[facility] [place]. Patient advised we close at noon on Friday

## 2017-04-14 NOTE — Telephone Encounter (Signed)
Rx's up front [facility]/fim 

## 2017-04-17 IMAGING — CR DG KNEE 1-2V*L*
2 series · 2 of 2 positions shown · non-contrast
Comparison: MR left knee of 02/24/2006

CLINICAL DATA: Preop for left total knee replacement

EXAM:
LEFT KNEE - 1-2 VIEW

[w knee ap left]
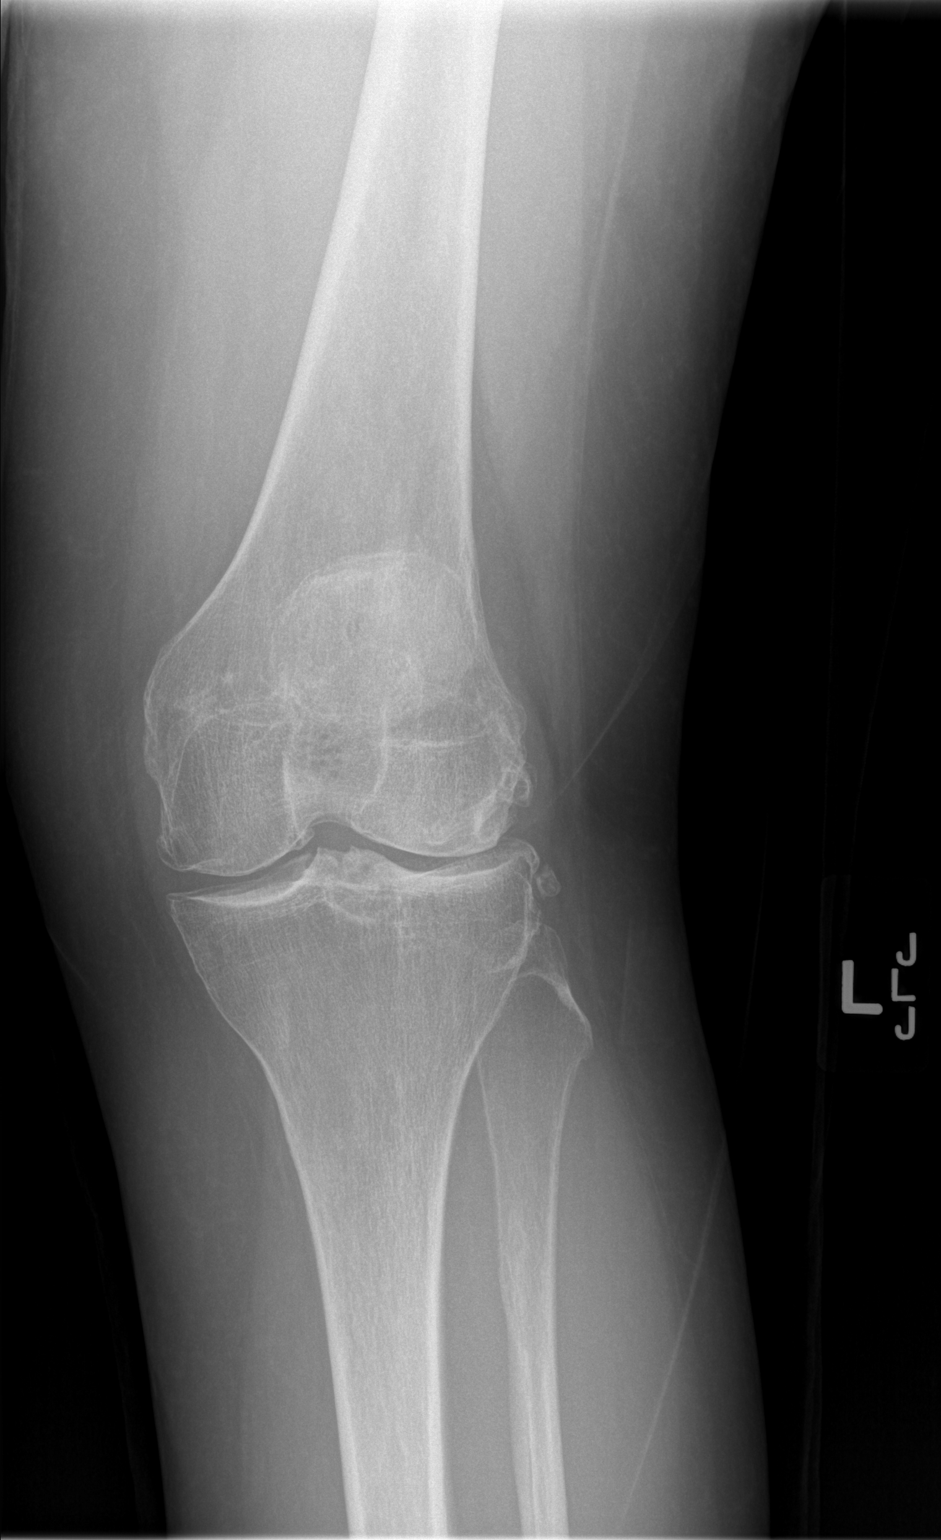

[w knee lat. left]
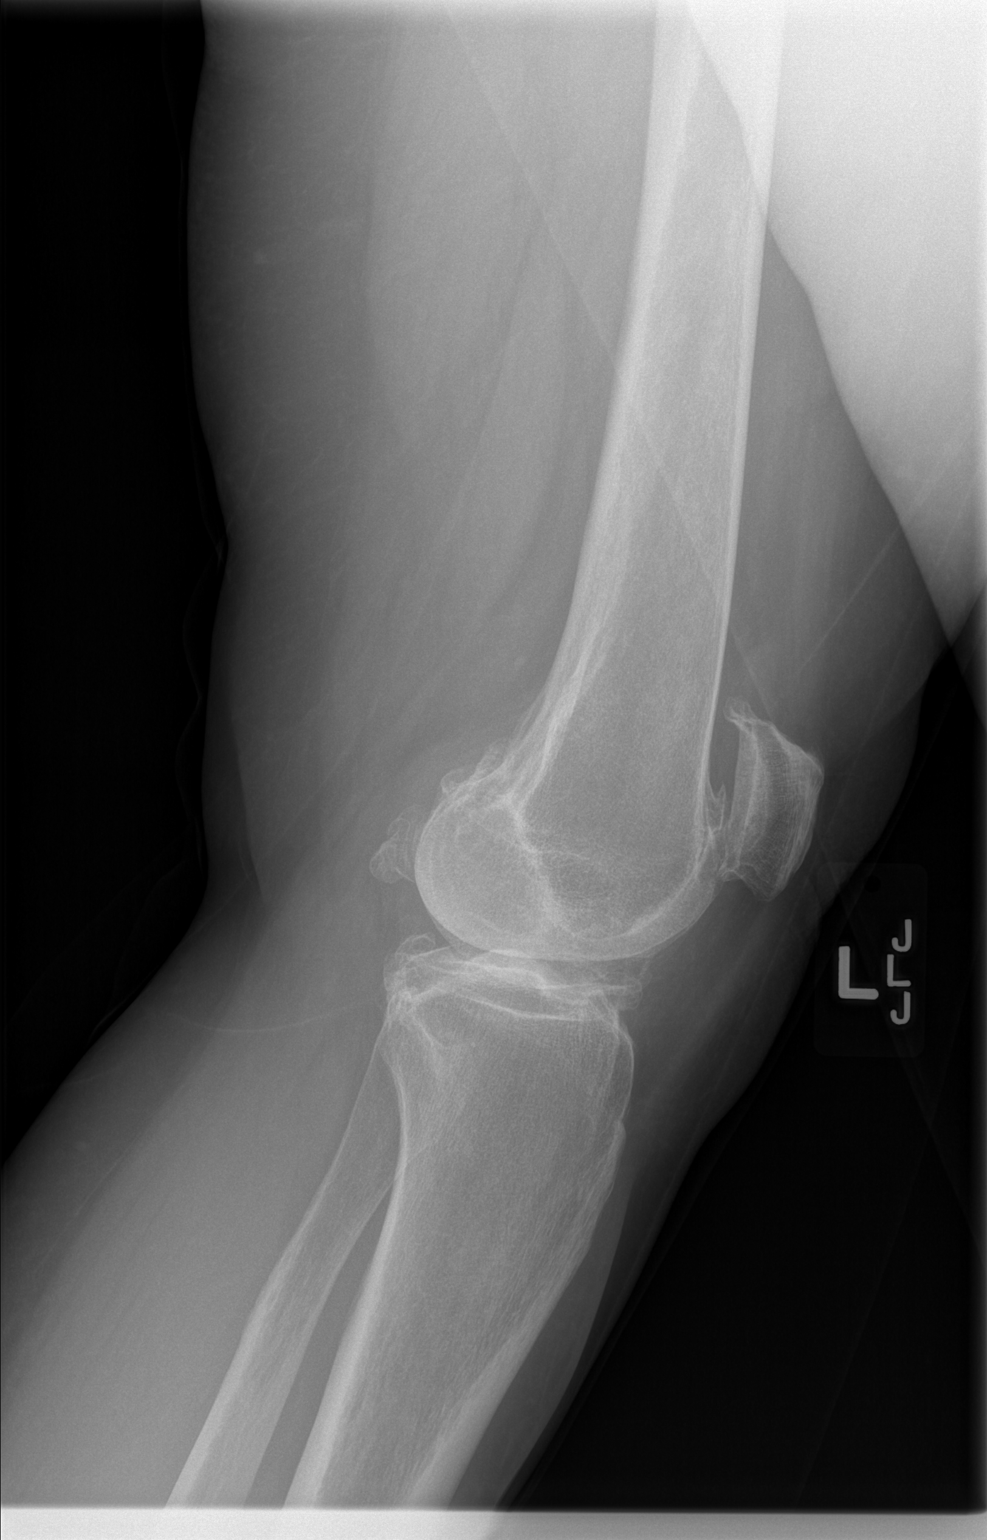

[2 of 2 positions shown; findings below may reference images not displayed]

FINDINGS: There is somewhat age advanced tricompartmental degenerative joint
disease of the left knee. There is spurring and sclerosis involving
all 3 compartments. No fracture is seen. No joint effusion is noted.
IMPRESSION: Age advanced tricompartmental degenerative joint disease of the left
knee.

## 2017-04-17 NOTE — Progress Notes (Signed)
This encounter was created in error - please disregard.

## 2017-04-26 IMAGING — DX DG KNEE 1-2V PORT*L*
2 series · 2 of 2 positions shown · non-contrast
Comparison: 10/27/2015

CLINICAL DATA: Total knee replacement

EXAM:
PORTABLE LEFT KNEE - 1-2 VIEW

[knee ap]
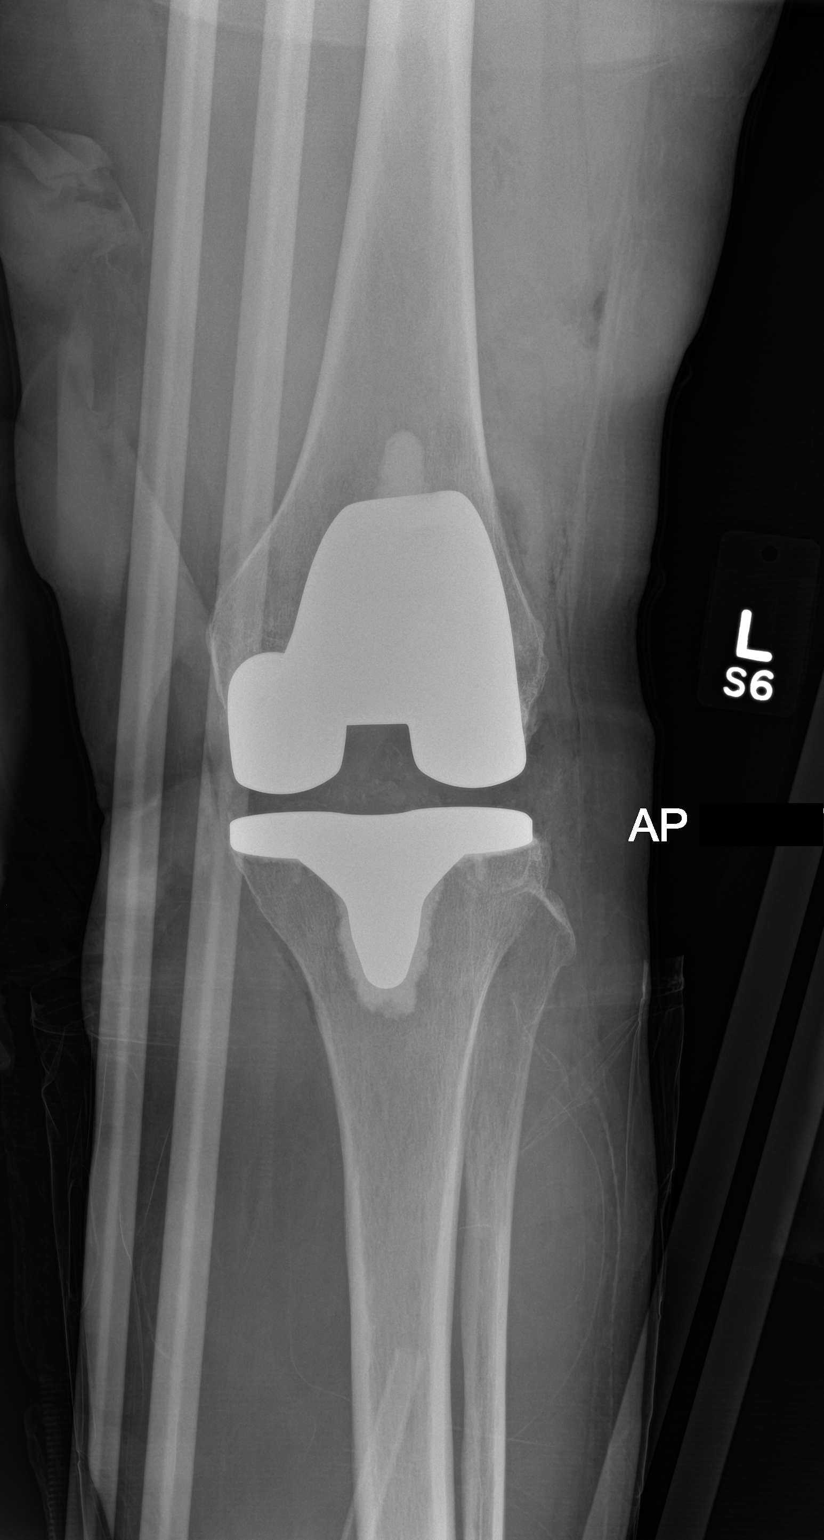

[knee lat]
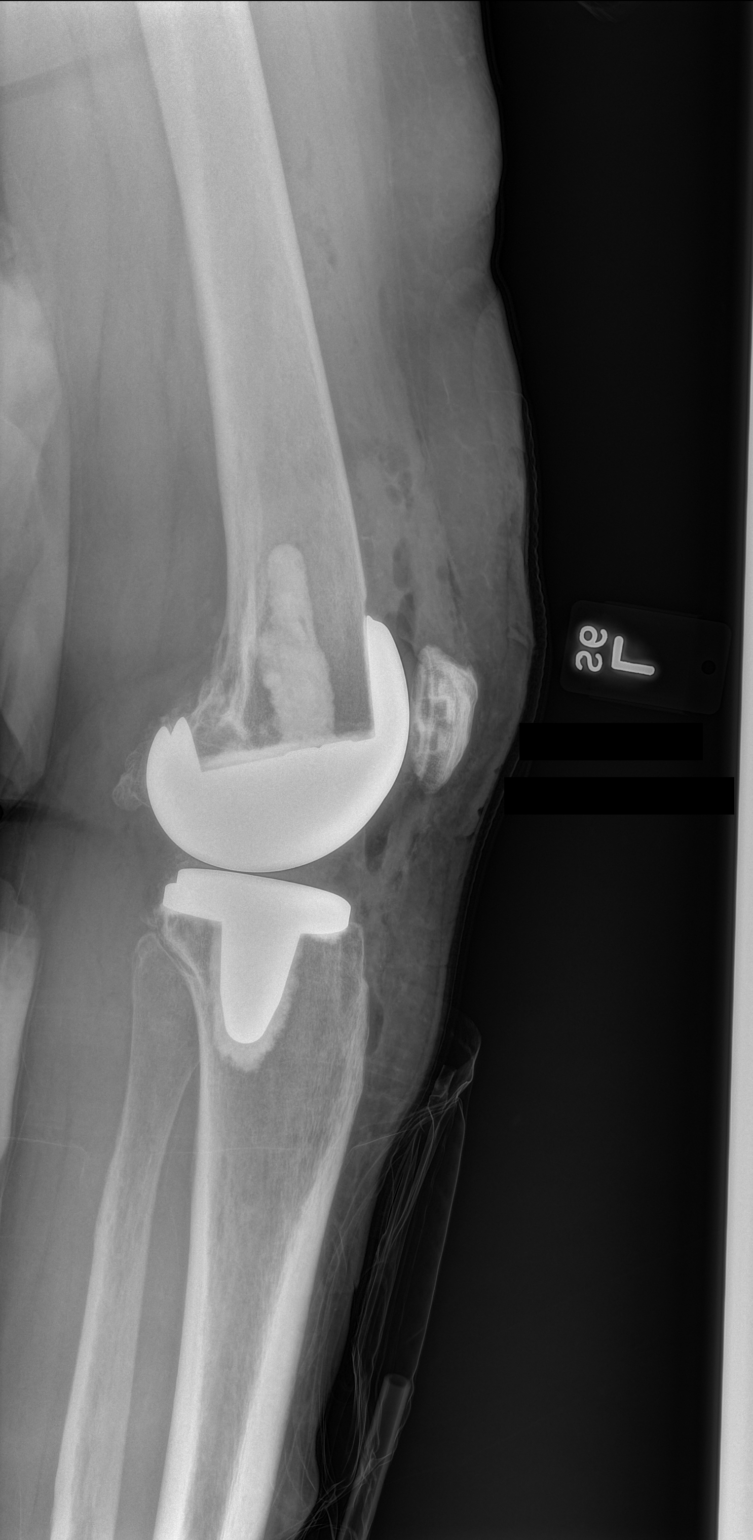

[2 of 2 positions shown; findings below may reference images not displayed]

FINDINGS: Left knee replacement satisfactory position alignment. No fracture
or immediate complication.
IMPRESSION: Satisfactory left knee replacement.

## 2017-05-15 ENCOUNTER — Telehealth: Payer: Self-pay | Admitting: Neurology

## 2017-05-15 MED ORDER — METHYLPHENIDATE HCL 20 MG PO TABS: ORAL_TABLET | ORAL | 0 refills | Status: DC

## 2017-05-15 MED ORDER — HYDROCODONE-ACETAMINOPHEN 7.5-325 MG PO TABS: 2.0000 | ORAL_TABLET | Freq: Three times a day (TID) | ORAL | 0 refills | Status: DC | PRN

## 2017-05-15 NOTE — Telephone Encounter (Signed)
Rx's up front [facility]/fim 

## 2017-05-15 NOTE — Telephone Encounter (Signed)
Patient requesting refill of HYDROcodone-acetaminophen (NORCO) 7.5-325 MG tablet and methylphenidate (RITALIN) 20 MG tablet. She also needs a new Rx [facility] [place] for Gabapentin. She now takes 2 a day but wants Rx for 3 a day in case needed.

## 2017-05-15 NOTE — Addendum Note (Signed)
Addended by: Candis Schatz I on: 05/15/2017 11:04 AM   Modules accepted: Orders

## 2017-05-15 NOTE — Telephone Encounter (Signed)
Rx's awaiting RAS sig/fim 

## 2017-05-17 NOTE — Progress Notes (Signed)
This encounter was created in error - please disregard.

## 2017-05-18 ENCOUNTER — Other Ambulatory Visit: Payer: Self-pay | Admitting: Nurse Practitioner

## 2017-05-18 DIAGNOSIS — Z1231 Encounter for screening mammogram for malignant neoplasm of breast: Secondary | ICD-10-CM

## 2017-05-22 ENCOUNTER — Emergency Department (HOSPITAL_COMMUNITY): Admission: EM | Admit: 2017-05-22 | Discharge: 2017-05-22 | Discharge status: Against Medical Advice | Payer: 59

## 2017-05-23 ENCOUNTER — Ambulatory Visit (INDEPENDENT_AMBULATORY_CARE_PROVIDER_SITE_OTHER): Payer: Commercial Managed Care - HMO | Admitting: Internal Medicine

## 2017-05-23 ENCOUNTER — Encounter: Payer: Self-pay | Admitting: Internal Medicine

## 2017-05-23 ENCOUNTER — Ambulatory Visit
Admission: RE | Admit: 2017-05-23 | Discharge: 2017-05-23 | Disposition: A | Discharge status: Home / Self Care | Payer: Commercial Managed Care - HMO | Source: Ambulatory Visit | Attending: Internal Medicine | Admitting: Internal Medicine

## 2017-05-23 ENCOUNTER — Telehealth: Payer: Self-pay

## 2017-05-23 VITALS — BP 116/76 | HR 90 | Temp 99.4°F | Resp 24 | Wt 131.0 lb

## 2017-05-23 DIAGNOSIS — G35 Multiple sclerosis: Secondary | ICD-10-CM

## 2017-05-23 DIAGNOSIS — J209 Acute bronchitis, unspecified: Secondary | ICD-10-CM | POA: Diagnosis not present

## 2017-05-23 DIAGNOSIS — R059 Cough, unspecified: Secondary | ICD-10-CM

## 2017-05-23 DIAGNOSIS — W57XXXA Bitten or stung by nonvenomous insect and other nonvenomous arthropods, initial encounter: Secondary | ICD-10-CM | POA: Diagnosis not present

## 2017-05-23 DIAGNOSIS — D702 Other drug-induced agranulocytosis: Secondary | ICD-10-CM

## 2017-05-23 DIAGNOSIS — J189 Pneumonia, unspecified organism: Secondary | ICD-10-CM

## 2017-05-23 DIAGNOSIS — R05 Cough: Secondary | ICD-10-CM

## 2017-05-23 DIAGNOSIS — R5382 Chronic fatigue, unspecified: Secondary | ICD-10-CM

## 2017-05-23 LAB — CBC WITH DIFFERENTIAL/PLATELET
Basophils Absolute: 0 cells/uL (ref 0–200)
Basophils Relative: 0 %
Eosinophils Absolute: 0 cells/uL — ABNORMAL LOW (ref 15–500)
Eosinophils Relative: 0 %
HCT: 41.7 % (ref 35.0–45.0)
Hemoglobin: 13.7 g/dL (ref 11.7–15.5)
Lymphocytes Relative: 12 %
Lymphs Abs: 1392 cells/uL (ref 850–3900)
MCH: 29.9 pg (ref 27.0–33.0)
MCHC: 32.9 g/dL (ref 32.0–36.0)
MCV: 91 fL (ref 80.0–100.0)
MPV: 10.4 fL (ref 7.5–12.5)
Monocytes Absolute: 1044 cells/uL — ABNORMAL HIGH (ref 200–950)
Monocytes Relative: 9 %
Neutro Abs: 9164 cells/uL — ABNORMAL HIGH (ref 1500–7800)
Neutrophils Relative %: 79 %
Platelets: 286 10*3/uL (ref 140–400)
RBC: 4.58 MIL/uL (ref 3.80–5.10)
RDW: 13.8 % (ref 11.0–15.0)
WBC: 11.6 10*3/uL — ABNORMAL HIGH (ref 3.8–10.8)

## 2017-05-23 LAB — COMPLETE METABOLIC PANEL WITHOUT GFR
ALT: 20 U/L (ref 6–29)
AST: 19 U/L (ref 10–35)
Albumin: 4.3 g/dL (ref 3.6–5.1)
Alkaline Phosphatase: 94 U/L (ref 33–130)
BUN: 16 mg/dL (ref 7–25)
CO2: 29 mmol/L (ref 20–31)
Calcium: 9.7 mg/dL (ref 8.6–10.4)
Chloride: 96 mmol/L — ABNORMAL LOW (ref 98–110)
Creat: 0.79 mg/dL (ref 0.50–1.05)
GFR, Est African American: >89 mL/min (ref >=60)
GFR, Est Non African American: 83 mL/min (ref >=60)
Glucose, Bld: 81 mg/dL (ref 65–99)
Potassium: 4.1 mmol/L (ref 3.5–5.3)
Sodium: 138 mmol/L (ref 135–146)
Total Bilirubin: 1 mg/dL (ref 0.2–1.2)
Total Protein: 7.3 g/dL (ref 6.1–8.1)

## 2017-05-23 MED ORDER — HYDROCODONE-HOMATROPINE 5-1.5 MG/5ML PO SYRP: 5.0000 mL | ORAL_SOLUTION | Freq: Three times a day (TID) | ORAL | 0 refills | Status: DC | PRN

## 2017-05-23 MED ORDER — AZITHROMYCIN 250 MG PO TABS: ORAL_TABLET | ORAL | 0 refills | Status: DC

## 2017-05-23 MED ORDER — CEFTRIAXONE SODIUM 1 G IJ SOLR
1.0000 g | Freq: Once | INTRAMUSCULAR | Status: AC
  Administered 2017-05-23: 1 g via INTRAMUSCULAR

## 2017-05-23 MED ORDER — METHYLPREDNISOLONE ACETATE 80 MG/ML IJ SUSP
80.0000 mg | Freq: Once | INTRAMUSCULAR | Status: AC
  Administered 2017-05-23: 80 mg via INTRAMUSCULAR

## 2017-05-23 MED ORDER — ONDANSETRON HCL 4 MG PO TABS: 4.0000 mg | ORAL_TABLET | Freq: Three times a day (TID) | ORAL | 0 refills | Status: DC | PRN

## 2017-05-23 NOTE — Patient Instructions (Addendum)
Depo-Medrol 80 mg IM, Rocephin 1 g IM. Takes Zofran as needed for nausea. Hycodan sparingly for cough. Rest and drink plenty of fluids. Take Zithromax Z-PAK. Lab work drawn and pending.

## 2017-05-23 NOTE — Telephone Encounter (Signed)
Chest x ray needed before visit

## 2017-05-23 NOTE — Progress Notes (Signed)
   Subjective:    Patient ID: [name], female    DOB: Dec 20, 1960, 56 y.o.   MRN: [id number]  HPI Pt called last evening with fever chills malaise and fatigue. She thought she may have pneumonia. Was advised to go to ED which she did but was not seen because wait was very long. Her son brought her to the office today. Pt was advised to have CXR before appointment. On the way to Xray facility, son was given ticket for speeding.    Review of Systems see above-having some right neck and ear pain. Has also had tick exposure recently. Has nausea.     Objective:   Physical Exam She looks pale and weak and fatigued. Speaks slowly.  Patient has a history of multiple sclerosis and is under the care of neurology.  Skin warm and dry. Nodes none. Pharynx not injected. TMs are clear neck is supple. Chest clear to auscultation. Chest x-ray reveals no evidence of pneumonia or pleural effusions         Assessment & Plan:  Acute bronchitis-given 80 mg IM Depo-Medrol, Given 1 g IM Rocephin and started on Zithromax Z-PAK. Given Hycodan for cough.  Probable volume depletion-needs to push oral fluids  Nausea-treat with Zofran  Blood work done included CBC with differential, RMSF titer and Lyme titer. Rest and drink plenty of fluids.

## 2017-05-23 NOTE — Addendum Note (Signed)
Addended by: Yolande Jolly on: 05/23/2017 12:53 PM   Modules accepted: Orders

## 2017-05-24 LAB — ROCKY MTN SPOTTED FVR ABS PNL(IGG+IGM)
RMSF IgG: NOT DETECTED
RMSF IgM: NOT DETECTED

## 2017-05-24 LAB — LYME AB/WESTERN BLOT REFLEX: B burgdorferi Ab IgG+IgM: <0.9 Index (ref <0.90)

## 2017-05-26 ENCOUNTER — Telehealth: Payer: Self-pay

## 2017-05-26 NOTE — Telephone Encounter (Signed)
I have not seen her for a lot of respiratory infections but when she gets one, it is fairly severe and it takes a while to get over it. She should ask Neurologist this question.

## 2017-05-26 NOTE — Telephone Encounter (Signed)
Pt would like to ask if she is getting a lot of respiratory infections, she would like to know if it was because she is immunocompromised or because she takes immunosuppressant drugs. Please advise

## 2017-05-26 NOTE — Telephone Encounter (Signed)
No VM Box set up, unable to leave VM.

## 2017-05-31 NOTE — Telephone Encounter (Signed)
Pt is aware and will speak to her neurologist.

## 2017-06-02 ENCOUNTER — Ambulatory Visit: Payer: Commercial Managed Care - HMO

## 2017-06-14 ENCOUNTER — Telehealth: Payer: Self-pay | Admitting: Neurology

## 2017-06-14 MED ORDER — METHYLPHENIDATE HCL 20 MG PO TABS: ORAL_TABLET | ORAL | 0 refills | Status: DC

## 2017-06-14 MED ORDER — HYDROCODONE-ACETAMINOPHEN 7.5-325 MG PO TABS: 2.0000 | ORAL_TABLET | Freq: Three times a day (TID) | ORAL | 0 refills | Status: DC | PRN

## 2017-06-14 NOTE — Addendum Note (Signed)
Addended by: Candis Schatz I on: 06/14/2017 11:06 AM   Modules accepted: Orders

## 2017-06-14 NOTE — Telephone Encounter (Signed)
Rx's up front [facility]/fim 

## 2017-06-14 NOTE — Telephone Encounter (Signed)
Patient called office requesting refill for HYDROcodone-acetaminophen (NORCO) 7.5-325 MG tablet and methylphenidate (RITALIN) 20 MG tablet (patient would like to know if she is able to do 10mg  again and if so she will need 180 tablets).  Please call

## 2017-06-14 NOTE — Telephone Encounter (Signed)
Rx's awaiting RAS sig/fim 

## 2017-06-19 ENCOUNTER — Ambulatory Visit (INDEPENDENT_AMBULATORY_CARE_PROVIDER_SITE_OTHER): Payer: 59 | Admitting: Neurology

## 2017-06-19 ENCOUNTER — Encounter: Payer: Self-pay | Admitting: Neurology

## 2017-06-19 VITALS — BP 128/81 | HR 60 | Resp 18 | Ht 65.0 in | Wt 130.5 lb

## 2017-06-19 DIAGNOSIS — G35 Multiple sclerosis: Secondary | ICD-10-CM | POA: Diagnosis not present

## 2017-06-19 DIAGNOSIS — R208 Other disturbances of skin sensation: Secondary | ICD-10-CM

## 2017-06-19 DIAGNOSIS — F988 Other specified behavioral and emotional disorders with onset usually occurring in childhood and adolescence: Secondary | ICD-10-CM | POA: Diagnosis not present

## 2017-06-19 DIAGNOSIS — B0229 Other postherpetic nervous system involvement: Secondary | ICD-10-CM | POA: Diagnosis not present

## 2017-06-19 DIAGNOSIS — R5383 Other fatigue: Secondary | ICD-10-CM

## 2017-06-19 DIAGNOSIS — R3911 Hesitancy of micturition: Secondary | ICD-10-CM | POA: Diagnosis not present

## 2017-06-19 DIAGNOSIS — R269 Unspecified abnormalities of gait and mobility: Secondary | ICD-10-CM | POA: Diagnosis not present

## 2017-06-19 DIAGNOSIS — R4189 Other symptoms and signs involving cognitive functions and awareness: Secondary | ICD-10-CM

## 2017-06-19 MED ORDER — METHYLPHENIDATE HCL 10 MG PO TABS: ORAL_TABLET | ORAL | 0 refills | Status: DC

## 2017-06-19 MED ORDER — GABAPENTIN 600 MG PO TABS: 600.0000 mg | ORAL_TABLET | Freq: Two times a day (BID) | ORAL | 3 refills | Status: DC

## 2017-06-19 NOTE — Progress Notes (Signed)
[facility]  PATIENT: [name] DOB: 1961/06/02 REASON FOR VISIT: Multiple sclerosis   HISTORICAL  CHIEF COMPLAINT:  Chief Complaint  Patient presents with  . Multiple Sclerosis    Sts. she continues to tolerate Tecfidera well.  Sts. both legs are some weaker.  Sts. she is having trouble getting name brand Ritalin 20mg , but her pharmacist kees the 10mg  (IR) in stock.  Sts. she also believes 10mg  worked better.  Would like rx. for Ritalin 10mg  #6 per day instead of 20mg  #3 per day./fim    HISTORY OF PRESENT ILLNESS:  [name] is a 56 year old woman with multiple sclerosis diagnosed in 2008.    Since the last visit, she has had bronchitis and feels weaker and more fatigued.   She is having trouble Ritalin 20 mg.    MS:   She was feeling much more fatigued earlier this year.   Vit D was low (13.2) 03/17/2017.   CBC showed low WBC and slightly low neutrophils and normal lymphocytes.   She is on Tecfidera and denies exacerbation and she tolerates it well.   Last lymphocyte count was 1.1 in  April.    She notes cognitive and physical fatigue are bad and today is a worse than typical day..          Gait/Strength/sensation:   She feels walking is worse and balance is poor.    The right leg drags if she walks more than a short distance.   She also has right greater than left leg clumsiness.  She uses a cane often and has no recent falls..    She has dysesthesias in her feet and milder in her hands.    Pain is burning and left > right up to the mid shin.    She gets a tight sensation and a poking sensation in the thoracic spine region.   Baclofen and tizanidine were not well tolerated.  PHN:   She had shingles at the sternum and down left arm in December 2016 after her knee surgery.   She now takes only 600 mg gabapentin at night as higher doses were not well tolerated.  She is also on hydrocodone up to 6/day.   Shingles occurred at time of left knee surgery  Bladder:    She has had some bladder dysfunction and has mild incontinence. She there is some hesitancy but perhaps also was not tolerated.    Vision:    She is doing about the same with occasional diplopia, worse when tired.   An early exacerbation was diplopia.    No h/o optic neuritis.   No color asymmetry.   Fatigue/sleep:   She reports fatigue is both physical and cognitive. Ritalin (brand name) has helped her fatigue more than anything else.. The fatigue is worse in the heat.    She has some attentional deficits helped by Ritalin.  Sleep is better on gabapentin at night.   Xanax help sleep onset.   She has nocturia x 4 some night but usually falls back asleep  Mood/cognition:  She has a mild depression and mild anxiety.  She feels these issues are stable.  She had a severe depression around 2010 and was on an antidepressant but did not tolerate it well.  She also has had some anxiety helped by Xanax..   She notes a brain fog at times and  Ritalin has helped a lot and she tolerates it well.      She has some  word finding problems and decreased attention.    MS History:  In 2008, she had an MRI of the brain performed which was consistent with multiple sclerosis and she was diagnosed with relapsing remitting MS. He was initially placed on Avonex for therapy. She had some exacerbations with weakness and clumsiness with her gait and she started to see Dr. Leotis Shames. He placed her on Tysabri she is on Tysabri between 2010 and 2013. 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      REVIEW OF SYSTEMS:  Constitutional: No fevers, chills, sweats, or change in appetite.  She notes fatigue Eyes: No visual changes,eye pain.   She has intermittent diplopia Ear, nose and throat: No hearing loss, ear pain, nasal congestion, sore throat Cardiovascular: No chest pain, palpitations Respiratory:  No shortness of breath at rest or with  exertion.   No wheezes GastrointestinaI: No nausea, vomiting, diarrhea, abdominal pain, fecal incontinence Genitourinary:  a sabove Musculoskeletal:  she has both neck pain and back pain. Also bilat knee pain Integumentary: No rash, pruritus, skin lesions.   She has Raynauds in her hands  Neurological: as above Psychiatric: No depression at this time.  Anxiety doing ok on med's Endocrine: No palpitations, diaphoresis, change in appetite, change in weigh or increased thirst Hematologic/Lymphatic:  No anemia, purpura, petechiae. Allergic/Immunologic: No itchy/runny eyes, nasal congestion, recent allergic reactions, rashes  ALLERGIES: Allergies  Allergen Reactions  . Ibuprofen Other (See Comments)    CAUSES LYMPHEDEMA  . Nsaids Other (See Comments)    AVOIDS ANY SODIUM CONTAINING MED. -- CAUSES LYMPHEDEMA  . Other Swelling and Other (See Comments)    Laxatives- causes leg swelling    HOME MEDICATIONS: Outpatient Medications Prior to Visit  Medication Sig Dispense Refill  . ALPRAZolam (XANAX) 0.5 MG tablet TAKE 1 TABLET BY MOUTH 3 TIMES A DAY AS NEEDED FOR ANXIETY OR SLEEP 90 tablet 5  . azithromycin (ZITHROMAX) 250 MG tablet 2 po day 1 followed by one po days 2-5 6 tablet 0  . Dimethyl Fumarate (TECFIDERA) 240 MG CPDR Take 1 capsule (240 mg total) by mouth 2 (two) times daily. 180 capsule 3  . hyoscyamine (LEVSIN SL) 0.125 MG SL tablet Place 1 tablet (0.125 mg total) under the tongue every 4 (four) hours as needed. 270 tablet 3  . Magnesium 200 MG TABS Take 200 mg by mouth daily.    . methylphenidate (RITALIN) 20 MG tablet 40 mg in the morning and 20 mg with lunch. 90 tablet 0  . metoprolol (LOPRESSOR) 50 MG tablet Take 0.5 tablets (25 mg total) by mouth 2 (two) times daily as needed (racing heartbeat). 30 tablet 0  . NON FORMULARY Take 1 tablet by mouth See admin instructions. MALIC ACID-TAKES 1 TAB IN EVENING AND 1 TAB AT BEDTIME    . ondansetron (ZOFRAN) 4 MG tablet Take 1 tablet (4  mg total) by mouth every 8 (eight) hours as needed for nausea or vomiting. 20 tablet 0  . Vitamin D, Ergocalciferol, (DRISDOL) 50000 units CAPS capsule Take 1 capsule (50,000 Units total) by mouth every 7 (seven) days. 26 capsule 0  . gabapentin (NEURONTIN) 600 MG tablet Take 1 tablet (600 mg total) by mouth at bedtime. 90 tablet 3  . CALCIUM-MAG-VIT C-VIT D PO Take 1 tablet by mouth daily.    Marland Kitchen HYDROcodone-acetaminophen (NORCO) 7.5-325 MG tablet Take 2 tablets by mouth every  8 (eight) hours as needed. 180 tablet 0  . HYDROcodone-homatropine (HYCODAN) 5-1.5 MG/5ML syrup Take 5 mLs by mouth every 8 (eight) hours as needed for cough. 120 mL 0   Facility-Administered Medications Prior to Visit  Medication Dose Route Frequency Provider Last Rate Last Dose  . gadopentetate dimeglumine (MAGNEVIST) injection 12 mL  12 mL Intravenous Once PRN Andon Villard A, MD      . gadopentetate dimeglumine (MAGNEVIST) injection 15 mL  15 mL Intravenous Once PRN Izyan Ezzell, Pearletha Furl, MD        PAST MEDICAL HISTORY: Past Medical History:  Diagnosis Date  . ADD (attention deficit disorder)   . Anxiety   . Arthritis   . Bladder incontinence    wears a pad  . Bone spur    cervical bone spurs  . Chronic fatigue   . Dysrhythmia    hx A-FIB  . 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 Headache(784.0)   . History of concussion YOUNG ADULT--  NO RESIDUAL  . IBS (irritable bowel syndrome)   . Movement disorder   . MS (multiple sclerosis) (HCC)   . MVP (mitral valve prolapse) HX HEART RACING  YRS AGO   ASYMPTOMATIC SINCE THEN  . Vision abnormalities   . Weakness of right leg SECONDARY TO MS    PAST SURGICAL HISTORY: Past Surgical History:  Procedure Laterality Date  . CESAREAN SECTION  X3   BILATERAL TUBAL LIGATION W/ LAST ONE  . DESTRUCTION OF ANAL CONDYLOMA  07-07-2005  DR TOTH  . HERNIA REPAIR    . KNEE ARTHROSCOPY WITH LATERAL MENISECTOMY  10/26/2012   Procedure: KNEE ARTHROSCOPY WITH LATERAL  MENISECTOMY;  Surgeon: Javier Docker, MD;  Location: [facility] ;  Service: Orthopedics;  Laterality: Left;  Left Knee Arthrsocopy with Partial Lateral Menisectomy  . LAPAROSCOPIC ASSISTED VAGINAL HYSTERECTOMY  01-15-2002  DR Gerlene Burdock HOLLAND/ DR TIM DAVIS   AND REPAIR VENTRAL HERNIA  . LASER ABLATION CONDOLAMATA N/A 04/26/2013   Procedure: LASER ABLATION CONDOLAMATA;  Surgeon: Romie Levee, MD;  Location[facility];  Service: General;  Laterality: N/A;  . TONSILLECTOMY    . TOTAL KNEE ARTHROPLASTY Left 11/05/2015   Procedure: LEFT TOTAL KNEE ARTHROPLASTY;  Surgeon: Jene Every, MD;  Location: [facility];  Service: Orthopedics;  Laterality: Left;    FAMILY HISTORY: Family History  Problem Relation Age of Onset  . Diabetes Mother   . Hypertension Mother   . Stroke Mother   . Bowel Disease Brother   . Diabetes Brother   . Healthy Brother   . Emphysema Father   . Heart attack Father     SOCIAL HISTORY:  Social History   Social History  . Marital status: Married    Spouse name: N/A  . Number of children: 3  . Years of education: N/A   Occupational History  . Not on file.   Social History Main Topics  . Smoking status: Never Smoker  . Smokeless tobacco: Never Used  . Alcohol use No  . Drug use: No  . Sexual activity: Not on file   Other Topics Concern  . Not on file   Social History Narrative  . No narrative on file     PHYSICAL EXAM  Vitals:   06/19/17 1108  BP: 128/81  Pulse: 60  Resp: 18  Weight: 130 lb 8 oz (59.2 kg)  Height: 5\' 5"  (1.651 m)    Body mass index is 21.72 kg/m.  General: The patient is well-developed and well-nourished and in no acute distress   Neurologic Exam  Mental status: The patient is alert and oriented x 3 at the time of the examination. The patient has apparent normal recent and remote memory, with an apparently normal attention span and concentration ability.   Speech is normal.  Cranial  nerves: Extraocular muscles are normal. Facial strength and sensation is normal. Trapezius strength is normal.. No dysarthria is noted.  The tongue is midline, and the patient has symmetric elevation of the soft palate.   Motor:  Muscle bulk is normal. Tone is mildly increased in her legs.. Strength is  5 / 5 in all 4 extremities.   Sensory: On sensory examination, she has intact sensation to touch and vibration in the arms and legs..  Coordination: Cerebellar testing reveals good finger-nose-finger bilaterally.  Gait and station: Station is normal and gait is slightly wide. Tandem gait is wide. Romberg is negative.   Reflexes: Deep tendon reflexes are symmetric and brisk bilaterally with spread a the knees but no ankle clonus.         DIAGNOSTIC DATA (LABS, IMAGING, TESTING) - I reviewed patient records, labs, notes, testing and imaging myself where available.  Lab Results  Component Value Date   WBC 11.6 (H) 05/23/2017   HGB 13.7 05/23/2017   HCT 41.7 05/23/2017   MCV 91.0 05/23/2017   PLT 286 05/23/2017      Component Value Date/Time   NA 138 05/23/2017 1247   NA 142 03/17/2017 0937   K 4.1 05/23/2017 1247   CL 96 (L) 05/23/2017 1247   CO2 29 05/23/2017 1247   GLUCOSE 81 05/23/2017 1247   BUN 16 05/23/2017 1247   BUN 19 03/17/2017 0937   CREATININE 0.79 05/23/2017 1247   CALCIUM 9.7 05/23/2017 1247   PROT 7.3 05/23/2017 1247   PROT 7.3 03/17/2017 0937   ALBUMIN 4.3 05/23/2017 1247   ALBUMIN 4.8 03/17/2017 0937   AST 19 05/23/2017 1247   ALT 20 05/23/2017 1247   ALKPHOS 94 05/23/2017 1247   BILITOT 1.0 05/23/2017 1247   BILITOT 0.6 03/17/2017 0937   GFRNONAA 83 05/23/2017 1247   GFRAA >89 05/23/2017 1247   Lab Results  Component Value Date   CHOL 196 12/25/2014   HDL 92 12/25/2014   LDLCALC 91 12/25/2014   TRIG 65 12/25/2014   CHOLHDL 2.1 12/25/2014   Lab Results  Component Value Date   HGBA1C  03/10/2010    5.5 (NOTE) The ADA recommends the following  therapeutic goal for glycemic control related to Hgb A1c measurement: Goal of therapy: <6.5 Hgb A1c  Reference: American Diabetes Association: Clinical Practice Recommendations 2010, Diabetes Care, 2010, 33: (Suppl  1).      ASSESSMENT AND PLAN  Multiple sclerosis (HCC) - Plan: Vitamin B12, Thyroid Panel With TSH, CBC with Differential/Platelet, Methylmalonic acid, serum  Abnormality of gait - Plan: Vitamin B12, Thyroid Panel With TSH, CBC with Differential/Platelet  Attention deficit disorder, unspecified hyperactivity presence  Dysesthesia - Plan: Vitamin B12, Methylmalonic acid, serum  Disturbed cognition - Plan: Vitamin B12, Methylmalonic acid, serum  Urinary hesitancy  PHN (postherpetic neuralgia)  Other fatigue - Plan: Vitamin B12, Thyroid Panel With TSH, CBC with Differential/Platelet, Methylmalonic acid, serum  1.   Continue Tecfidera. I will recheck a CBC and  lymphocyte count.   2.   Because she was having trouble getting to 20 mg Ritalin, I will write for the 10 mg brand Ritalin 6 times a  day which will be the same dose.     Continue other med's     3.   Remain active, try to exercise.   4.   Her fatigue is worse and we will check B12 and thyroid function.   rtc 4-5 months, sooner if problems.  We will call her with the results of the MRI and bring her in sooner if we need to make changes in therapy   Mckenize Mezera A. Epimenio Foot, MD, PhD 06/19/2017, 1:15 PM Certified in Neurology, Clinical Neurophysiology, Sleep Medicine, Pain Medicine and Neuroimaging  [facility] [address], [address] [place], [place] [address] [phone] ]

## 2017-06-22 LAB — CBC WITH DIFFERENTIAL/PLATELET
Basophils Absolute: 0 10*3/uL (ref 0.0–0.2)
Basos: 1 %
EOS (ABSOLUTE): 0 10*3/uL (ref 0.0–0.4)
Eos: 0 %
Hematocrit: 41.1 % (ref 34.0–46.6)
Hemoglobin: 13.5 g/dL (ref 11.1–15.9)
Immature Grans (Abs): 0 10*3/uL (ref 0.0–0.1)
Immature Granulocytes: 0 %
Lymphocytes Absolute: 1.1 10*3/uL (ref 0.7–3.1)
Lymphs: 33 %
MCH: 29.8 pg (ref 26.6–33.0)
MCHC: 32.8 g/dL (ref 31.5–35.7)
MCV: 91 fL (ref 79–97)
Monocytes Absolute: 0.4 10*3/uL (ref 0.1–0.9)
Monocytes: 11 %
Neutrophils Absolute: 1.9 10*3/uL (ref 1.4–7.0)
Neutrophils: 55 %
Platelets: 235 10*3/uL (ref 150–379)
RBC: 4.53 x10E6/uL (ref 3.77–5.28)
RDW: 14.1 % (ref 12.3–15.4)
WBC: 3.5 10*3/uL (ref 3.4–10.8)

## 2017-06-22 LAB — THYROID PANEL WITH TSH
Free Thyroxine Index: 1.8 (ref 1.2–4.9)
T3 Uptake Ratio: 28 % (ref 24–39)
T4, Total: 6.5 ug/dL (ref 4.5–12.0)
TSH: 2.08 u[IU]/mL (ref 0.450–4.500)

## 2017-06-22 LAB — METHYLMALONIC ACID, SERUM: Methylmalonic Acid: 247 nmol/L (ref 0–378)

## 2017-06-22 LAB — VITAMIN B12: Vitamin B-12: 267 pg/mL (ref 232–1245)

## 2017-06-23 ENCOUNTER — Telehealth: Payer: Self-pay | Admitting: *Deleted

## 2017-06-23 MED ORDER — "SYRINGE 23G X 1"" 3 ML MISC": 3.0000 | 3 refills | Status: DC

## 2017-06-23 MED ORDER — CYANOCOBALAMIN 1000 MCG/ML IJ SOLN: 1000.0000 ug | INTRAMUSCULAR | 3 refills | Status: DC

## 2017-06-23 NOTE — Telephone Encounter (Signed)
-----   Message from Asa Lente, MD sent at 06/23/2017 10:23 AM EDT ----- Her vitamin B12 was low normal. She may feel better on B12 supplements. We could have her give herself a shot once a month.

## 2017-06-23 NOTE — Telephone Encounter (Signed)
I have spoken with [name] this morning, and per RAS, reviewed lab results as below.  She verbalized understanding of same and is agreeable to monthly vit. b12 inj.  Her husband is able to give inj.  He gave her IM Avonex inj. in the past.  [facility]. [facility] Vit. B12 [facility]/fim

## 2017-07-13 ENCOUNTER — Other Ambulatory Visit: Payer: Self-pay

## 2017-07-13 ENCOUNTER — Telehealth: Payer: Self-pay | Admitting: Neurology

## 2017-07-13 MED ORDER — HYDROCODONE-ACETAMINOPHEN 7.5-325 MG PO TABS: ORAL_TABLET | ORAL | 0 refills | Status: DC

## 2017-07-13 MED ORDER — METHYLPHENIDATE HCL 10 MG PO TABS: ORAL_TABLET | ORAL | 0 refills | Status: DC

## 2017-07-13 NOTE — Telephone Encounter (Signed)
Refills done left at front desk.

## 2017-07-13 NOTE — Telephone Encounter (Signed)
Patient called office in reference to requesting refills for HYDROcodone-acetaminophen (NORCO) 7.5-325 MG tablet, methylphenidate (RITALIN) 10 MG tablet.  Patient advised office closes at noon tomorrow.

## 2017-07-18 MED ORDER — HYDROCODONE-ACETAMINOPHEN 7.5-325 MG PO TABS: ORAL_TABLET | ORAL | 0 refills | Status: DC

## 2017-07-18 NOTE — Telephone Encounter (Signed)
Pt calling re: the HYDROcodone-acetaminophen (NORCO) 7.5-325 MG tablet prescription, she said it is normally for 180 tablets.  This time it was just for 30 tablets, pt is asking for a call back about this please

## 2017-07-18 NOTE — Telephone Encounter (Signed)
I have spoken with [name] this afternoon.  Rx. given last wk. was just for a 5 day supply.  New rx. printed, awaiting YY sig, as RAS is ooo this wk/fim

## 2017-07-18 NOTE — Telephone Encounter (Signed)
Rx. up front [facility]/fim 

## 2017-07-18 NOTE — Addendum Note (Signed)
Addended by: Candis Schatz I on: 07/18/2017 02:33 PM   Modules accepted: Orders

## 2017-07-20 ENCOUNTER — Telehealth: Payer: Self-pay | Admitting: Neurology

## 2017-07-20 NOTE — Telephone Encounter (Signed)
Patient called office in reference to see if she is able to take Spirulina "super food" with the Tecfidera.  Please call

## 2017-07-20 NOTE — Telephone Encounter (Signed)
I have spoken with [name] this afternoon.  She wants to know if she can take Spirulina with Tecfidera.  Sts. it is a "superfood"--blue/green algae, that she gets from [facility].  She read that it should not be taken with immunosuppressant meds. She has stopped it for now.  I will check with RAS when he returns to the office on Monday, then call her back/fim

## 2017-07-24 NOTE — Telephone Encounter (Signed)
I have spoken with [name] this morning and per RAS, advised ok to take Spirulina.  She verbalized understanding of same/fim

## 2017-08-15 ENCOUNTER — Other Ambulatory Visit: Payer: Self-pay | Admitting: *Deleted

## 2017-08-15 ENCOUNTER — Telehealth: Payer: Self-pay | Admitting: Neurology

## 2017-08-15 DIAGNOSIS — Z79899 Other long term (current) drug therapy: Secondary | ICD-10-CM

## 2017-08-15 DIAGNOSIS — G894 Chronic pain syndrome: Secondary | ICD-10-CM

## 2017-08-15 MED ORDER — HYDROCODONE-ACETAMINOPHEN 7.5-325 MG PO TABS: ORAL_TABLET | ORAL | 0 refills | Status: DC

## 2017-08-15 MED ORDER — METHYLPHENIDATE HCL 10 MG PO TABS: ORAL_TABLET | ORAL | 0 refills | Status: DC

## 2017-08-15 NOTE — Telephone Encounter (Signed)
Rx's up front [facility]/fim 

## 2017-08-15 NOTE — Telephone Encounter (Signed)
Rx's awaiting RAS sig/fim 

## 2017-08-15 NOTE — Telephone Encounter (Signed)
Patient called office requesting refills for HYDROcodone-acetaminophen (NORCO) 7.5-325 MG tablet and methylphenidate (RITALIN) 10 MG tablet

## 2017-08-15 NOTE — Addendum Note (Signed)
Addended by: Candis Schatz I on: 08/15/2017 10:11 AM   Modules accepted: Orders

## 2017-08-16 ENCOUNTER — Other Ambulatory Visit (INDEPENDENT_AMBULATORY_CARE_PROVIDER_SITE_OTHER): Payer: Self-pay

## 2017-08-16 DIAGNOSIS — Z79899 Other long term (current) drug therapy: Secondary | ICD-10-CM

## 2017-08-16 DIAGNOSIS — G894 Chronic pain syndrome: Secondary | ICD-10-CM

## 2017-08-16 DIAGNOSIS — Z0289 Encounter for other administrative examinations: Secondary | ICD-10-CM

## 2017-08-17 LAB — MED LIST OPTION NOT SELECTED

## 2017-08-22 LAB — DRUG SCREEN, UR (12+OXYCODONE+CRT)
Amphetamine Scrn, Ur: NEGATIVE ng/mL
BARBITURATE SCREEN URINE: NEGATIVE ng/mL
BENZODIAZEPINE SCREEN, URINE: NEGATIVE ng/mL
CANNABINOIDS UR QL SCN: NEGATIVE ng/mL
Cocaine (Metab) Scrn, Ur: NEGATIVE ng/mL
Creatinine(Crt), U: 146.5 mg/dL (ref 20.0–300.0)
Fentanyl, Urine: NEGATIVE pg/mL
Meperidine Screen, Urine: NEGATIVE ng/mL
Methadone Screen, Urine: NEGATIVE ng/mL
OXYCODONE+OXYMORPHONE UR QL SCN: NEGATIVE ng/mL
Opiate Scrn, Ur: POSITIVE ng/mL — AB
Ph of Urine: 7 (ref 4.5–8.9)
Phencyclidine Qn, Ur: NEGATIVE ng/mL
Propoxyphene Scrn, Ur: NEGATIVE ng/mL
SPECIFIC GRAVITY: 1.022
Tramadol Screen, Urine: NEGATIVE ng/mL

## 2017-09-13 ENCOUNTER — Telehealth: Payer: Self-pay | Admitting: Neurology

## 2017-09-13 MED ORDER — METHYLPHENIDATE HCL 10 MG PO TABS: ORAL_TABLET | ORAL | 0 refills | Status: DC

## 2017-09-13 MED ORDER — HYDROCODONE-ACETAMINOPHEN 7.5-325 MG PO TABS: ORAL_TABLET | ORAL | 0 refills | Status: DC

## 2017-09-13 NOTE — Telephone Encounter (Signed)
Rx's awaiting RAS sig/fim 

## 2017-09-13 NOTE — Telephone Encounter (Signed)
Pt request refill for HYDROcodone-acetaminophen (NORCO) 7.5-325 MG tablet and methylphenidate (RITALIN) 10 MG tablet

## 2017-09-13 NOTE — Telephone Encounter (Signed)
Rx's up front [facility]/fim 

## 2017-10-11 ENCOUNTER — Telehealth: Payer: Self-pay | Admitting: Neurology

## 2017-10-11 MED ORDER — HYDROCODONE-ACETAMINOPHEN 7.5-325 MG PO TABS: ORAL_TABLET | ORAL | 0 refills | Status: DC

## 2017-10-11 MED ORDER — METHYLPHENIDATE HCL 10 MG PO TABS: ORAL_TABLET | ORAL | 0 refills | Status: DC

## 2017-10-11 NOTE — Telephone Encounter (Signed)
Pt request refill for methylphenidate (RITALIN) 10 MG tablet and HYDROcodone-acetaminophen (NORCO) 7.5-325 MG tablet . Pt is wanting to pick it up tomorrow.

## 2017-10-11 NOTE — Telephone Encounter (Signed)
Rx's awaiting RAS sig/fim 

## 2017-10-11 NOTE — Telephone Encounter (Signed)
Rx's up front [facility]/fim 

## 2017-10-13 ENCOUNTER — Telehealth: Payer: Self-pay | Admitting: Neurology

## 2017-10-13 NOTE — Telephone Encounter (Signed)
Pt called she is wanting to know if [employer]/Hemp extract oil at [employer] will be ok to take orally. It is suppose to help with pain but she wants to make sure there will be no interaction with tecfidera. Please call to advise.

## 2017-10-16 NOTE — Telephone Encounter (Signed)
Spoke with HCA IncSheryl and explained that in general, CBD oil is ok to take, should not interact with Tecfidera/fim

## 2017-10-30 ENCOUNTER — Ambulatory Visit: Payer: 59 | Admitting: Neurology

## 2017-10-30 ENCOUNTER — Other Ambulatory Visit: Payer: Self-pay

## 2017-10-30 ENCOUNTER — Encounter: Payer: Self-pay | Admitting: Neurology

## 2017-10-30 VITALS — BP 129/82 | HR 74 | Resp 16 | Ht 65.0 in | Wt 137.0 lb

## 2017-10-30 DIAGNOSIS — B0229 Other postherpetic nervous system involvement: Secondary | ICD-10-CM

## 2017-10-30 DIAGNOSIS — G8929 Other chronic pain: Secondary | ICD-10-CM

## 2017-10-30 DIAGNOSIS — G35 Multiple sclerosis: Secondary | ICD-10-CM | POA: Diagnosis not present

## 2017-10-30 DIAGNOSIS — R269 Unspecified abnormalities of gait and mobility: Secondary | ICD-10-CM | POA: Diagnosis not present

## 2017-10-30 DIAGNOSIS — R208 Other disturbances of skin sensation: Secondary | ICD-10-CM

## 2017-10-30 DIAGNOSIS — M545 Low back pain, unspecified: Secondary | ICD-10-CM | POA: Insufficient documentation

## 2017-10-30 DIAGNOSIS — R4189 Other symptoms and signs involving cognitive functions and awareness: Secondary | ICD-10-CM

## 2017-10-30 DIAGNOSIS — F988 Other specified behavioral and emotional disorders with onset usually occurring in childhood and adolescence: Secondary | ICD-10-CM | POA: Diagnosis not present

## 2017-10-30 MED ORDER — CYANOCOBALAMIN 1000 MCG/ML IJ SOLN: 1000.0000 ug | INTRAMUSCULAR | 3 refills | Status: DC

## 2017-10-30 MED ORDER — METHYLPHENIDATE HCL 10 MG PO TABS: ORAL_TABLET | ORAL | 0 refills | Status: DC

## 2017-10-30 MED ORDER — OXCARBAZEPINE 300 MG PO TABS: ORAL_TABLET | ORAL | 11 refills | Status: DC

## 2017-10-30 MED ORDER — HYDROCODONE-ACETAMINOPHEN 7.5-325 MG PO TABS: ORAL_TABLET | ORAL | 0 refills | Status: DC

## 2017-10-30 MED ORDER — "SYRINGE 23G X 1"" 3 ML MISC": 3.0000 | 3 refills | Status: DC

## 2017-10-30 NOTE — Progress Notes (Addendum)
[facility]  PATIENT: [name] DOB: [birth year] REASON FOR VISIT: Multiple sclerosis   HISTORICAL  CHIEF COMPLAINT:  Chief Complaint  Patient presents with  . Multiple Sclerosis    Sts. she continues to tolerate Tecfidera well.  Having more gait disturbance due to right foot drop, fatigue in right leg. Sts. also having more mid to lower back pain, worse with wearing anything constrictive, like a bra/fim     HISTORY OF PRESENT ILLNESS:  [name] is a 56 year old woman with multiple sclerosis diagnosed in 2008.    Update 10/30/2017:    She is on Tecfidera and tolerates it well.   She has no recent exacerbation.   She is noting more of a right foot drop.   This seems associated with LBP the past few days.   She notes some weakness in that leg.     She notes having a lumbar spine MRI last year [employer]) and was told she has spinal stenosis.  I reviewed the MRI report which we had faxed to [place]. At L3-L4 she has mild spinal stenosis and also has facet and ligamentous hypertrophy at L4-L5 and L5S1 she has moderate sized disc bulges but does not have spinal stenosis at those levels.  She had an ESI by Dr. Ethelene Hal late 2017 and had benefit until recently.    The truncal dysesthesias bother her mostly at night.  She Is on gabapentin 600 mg po bid and it makes her sleepy if she goes to higher dose.    She notes no change in her bladder and wears pads since she has occasional incontinence.  No new visual problems.  She is sleeping ok on current med's.   She notes ritalin helps fatigue and cognitive fog/focus.    ___________________________________ From 06/19/2017: Since the last visit, she has had bronchitis and feels weaker and more fatigued.   She is having trouble Ritalin 20 mg.    MS:   She was feeling much more fatigued earlier this year.   Vit D was low (13.2) 03/17/2017.   CBC showed low WBC and slightly low neutrophils and normal lymphocytes.   She  is on Tecfidera and denies exacerbation and she tolerates it well.   Last lymphocyte count was 1.1 in  April.    She notes cognitive and physical fatigue are bad and today is a worse than typical day..          Gait/Strength/sensation:   She feels walking is worse and balance is poor.    The right leg drags if she walks more than a short distance.   She also has right greater than left leg clumsiness.  She uses a cane often and has no recent falls..    She has dysesthesias in her feet and milder in her hands.    Pain is burning and left > right up to the mid shin.    She gets a tight sensation and a poking sensation in the thoracic spine region.   Baclofen and tizanidine were not well tolerated.  PHN:   She had shingles at the sternum and down left arm in December 2016 after her knee surgery.   She now takes only 600 mg gabapentin at night as higher doses were not well tolerated.  She is also on hydrocodone up to 6/day.   Shingles occurred at time of left knee surgery  Bladder:   She has had some bladder dysfunction and has mild incontinence. She there is  some hesitancy but perhaps also was not tolerated.    Vision:    She is doing about the same with occasional diplopia, worse when tired.   An early exacerbation was diplopia.    No h/o optic neuritis.   No color asymmetry.   Fatigue/sleep:   She reports fatigue is both physical and cognitive. Ritalin (brand name) has helped her fatigue more than anything else.. The fatigue is worse in the heat.    She has some attentional deficits helped by Ritalin.  Sleep is better on gabapentin at night.   Xanax help sleep onset.   She has nocturia x 4 some night but usually falls back asleep  Mood/cognition:  She has a mild depression and mild anxiety.  She feels these issues are stable.  She had a severe depression around 2010 and was on an antidepressant but did not tolerate it well.  She also has had some anxiety helped by Xanax..   She notes a brain fog at times  and  Ritalin has helped a lot and she tolerates it well.      She has some word finding problems and decreased attention.    MS History:  In 2008, she had an MRI of the brain performed which was consistent with multiple sclerosis and she was diagnosed with relapsing remitting MS. He was initially placed on Avonex for therapy. She had some exacerbations with weakness and clumsiness with her gait and she started to see Dr. Leotis Shames. He placed her on Tysabri she is on Tysabri between 2010 and 2013. 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      REVIEW OF SYSTEMS:  Constitutional: No fevers, chills, sweats, or change in appetite.  She notes fatigue Eyes: No visual changes,eye pain.   She has intermittent diplopia Ear, nose and throat: No hearing loss, ear pain, nasal congestion, sore throat Cardiovascular: No chest pain, palpitations Respiratory:  No shortness of breath at rest or with exertion.   No wheezes GastrointestinaI: No nausea, vomiting, diarrhea, abdominal pain, fecal incontinence Genitourinary:  a sabove Musculoskeletal:  she has both neck pain and back pain. Also bilat knee pain Integumentary: No rash, pruritus, skin lesions.   She has Raynauds in her hands  Neurological: as above Psychiatric: No depression at this time.  Anxiety doing ok on med's Endocrine: No palpitations, diaphoresis, change in appetite, change in weigh or increased thirst Hematologic/Lymphatic:  No anemia, purpura, petechiae. Allergic/Immunologic: No itchy/runny eyes, nasal congestion, recent allergic reactions, rashes  ALLERGIES: Allergies  Allergen Reactions  . Ibuprofen Other (See Comments)    CAUSES LYMPHEDEMA  . Nsaids Other (See Comments)    AVOIDS ANY SODIUM CONTAINING MED. -- CAUSES LYMPHEDEMA  . Other Swelling and Other (See Comments)    Laxatives- causes leg swelling    HOME MEDICATIONS: Outpatient  Medications Prior to Visit  Medication Sig Dispense Refill  . ALPRAZolam (XANAX) 0.5 MG tablet TAKE 1 TABLET BY MOUTH 3 TIMES A DAY AS NEEDED FOR ANXIETY OR SLEEP 90 tablet 5  . Dimethyl Fumarate (TECFIDERA) 240 MG CPDR Take 1 capsule (240 mg total) by mouth 2 (two) times daily. 180 capsule 3  . gabapentin (NEURONTIN) 600 MG tablet Take 1 tablet (600 mg total) by mouth 2 (two) times daily. 180 tablet 3  . hyoscyamine (LEVSIN SL) 0.125 MG SL tablet Place 1 tablet (0.125 mg total) under the tongue  every 4 (four) hours as needed. 270 tablet 3  . Magnesium 200 MG TABS Take 200 mg by mouth daily.    . metoprolol (LOPRESSOR) 50 MG tablet Take 0.5 tablets (25 mg total) by mouth 2 (two) times daily as needed (racing heartbeat). 30 tablet 0  . NON FORMULARY Take 1 tablet by mouth See admin instructions. MALIC ACID-TAKES 1 TAB IN EVENING AND 1 TAB AT BEDTIME    . HYDROcodone-acetaminophen (NORCO) 7.5-325 MG tablet TAKE 2 TABLETS BY MOUTH EVERY 8 HOURS AS NEEDED 180 tablet 0  . methylphenidate (RITALIN) 10 MG tablet BRAND NAME NECESSARY.    Take six po daily in split doses 180 tablet 0  . Syringe/Needle, Disp, (SYRINGE 3CC/23GX1") 23G X 1" 3 ML MISC 3 Syringes by Does not apply route every 30 (thirty) days. 3 each 3  . azithromycin (ZITHROMAX) 250 MG tablet 2 po day 1 followed by one po days 2-5 6 tablet 0  . ondansetron (ZOFRAN) 4 MG tablet Take 1 tablet (4 mg total) by mouth every 8 (eight) hours as needed for nausea or vomiting. 20 tablet 0  . Vitamin D, Ergocalciferol, (DRISDOL) 50000 units CAPS capsule Take 1 capsule (50,000 Units total) by mouth every 7 (seven) days. 26 capsule 0  . cyanocobalamin (,VITAMIN B-12,) 1000 MCG/ML injection Inject 1 mL (1,000 mcg total) into the muscle every 30 (thirty) days. (Patient not taking: Reported on 10/30/2017) 3 mL 3   Facility-Administered Medications Prior to Visit  Medication Dose Route Frequency Provider Last Rate Last Dose  . gadopentetate dimeglumine  (MAGNEVIST) injection 12 mL  12 mL Intravenous Once PRN Carlos Heber A, MD      . gadopentetate dimeglumine (MAGNEVIST) injection 15 mL  15 mL Intravenous Once PRN Lot Medford, Pearletha Furl, MD        PAST MEDICAL HISTORY: Past Medical History:  Diagnosis Date  . ADD (attention deficit disorder)   . Anxiety   . Arthritis   . Bladder incontinence    wears a pad  . Bone spur    cervical bone spurs  . Chronic fatigue   . Dysrhythmia    hx A-FIB  . 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 Headache(784.0)   . History of concussion YOUNG ADULT--  NO RESIDUAL  . IBS (irritable bowel syndrome)   . Movement disorder   . MS (multiple sclerosis) (HCC)   . MVP (mitral valve prolapse) HX HEART RACING  YRS AGO   ASYMPTOMATIC SINCE THEN  . Vision abnormalities   . Weakness of right leg SECONDARY TO MS    PAST SURGICAL HISTORY: Past Surgical History:  Procedure Laterality Date  . CESAREAN SECTION  X3   BILATERAL TUBAL LIGATION W/ LAST ONE  . DESTRUCTION OF ANAL CONDYLOMA  07-07-2005  DR TOTH  . HERNIA REPAIR    . KNEE ARTHROSCOPY WITH LATERAL MENISECTOMY  10/26/2012   Procedure: KNEE ARTHROSCOPY WITH LATERAL MENISECTOMY;  Surgeon: Javier Docker, MD;  Location: [facility];  Service: Orthopedics;  Laterality: Left;  Left Knee Arthrsocopy with Partial Lateral Menisectomy  . LAPAROSCOPIC ASSISTED VAGINAL HYSTERECTOMY  01-15-2002  DR Gerlene Burdock HOLLAND/ DR TIM DAVIS   AND REPAIR VENTRAL HERNIA  . LASER ABLATION CONDOLAMATA N/A 04/26/2013   Procedure: LASER ABLATION CONDOLAMATA;  Surgeon: Romie Levee, MD;  Location: [facility];  Service: General;  Laterality: N/A;  . TONSILLECTOMY    . TOTAL KNEE ARTHROPLASTY Left 11/05/2015   Procedure: LEFT TOTAL KNEE ARTHROPLASTY;  Surgeon: Jene Every, MD;  Location: [facility];  Service: Orthopedics;  Laterality: Left;    FAMILY HISTORY: Family History  Problem Relation Age of Onset  . Diabetes Mother   . Hypertension  Mother   . Stroke Mother   . Bowel Disease Brother   . Diabetes Brother   . Healthy Brother   . Emphysema Father   . Heart attack Father     SOCIAL HISTORY:  Social History   Socioeconomic History  . Marital status: Married    Spouse name: Not on file  . Number of children: 3  . Years of education: Not on file  . Highest education level: Not on file  Social Needs  . Financial resource strain: Not on file  . Food insecurity - worry: Not on file  . Food insecurity - inability: Not on file  . Transportation needs - medical: Not on file  . Transportation needs - non-medical: Not on file  Occupational History  . Not on file  Tobacco Use  . Smoking status: Never Smoker  . Smokeless tobacco: Never Used  Substance and Sexual Activity  . Alcohol use: No  . Drug use: No  . Sexual activity: Not on file  Other Topics Concern  . Not on file  Social History Narrative  . Not on file     PHYSICAL EXAM  Vitals:   10/30/17 1035  BP: 129/82  Pulse: 74  Resp: 16  Weight: 137 lb (62.1 kg)  Height: 5\' 5"  (1.651 m)    Body mass index is 22.8 kg/m.   General: The patient is well-developed and well-nourished and in no acute distress   Neurologic Exam  Mental status: The patient is alert and oriented x 3 at the time of the examination. The patient has apparent normal recent and remote memory, with an apparently normal attention span and concentration ability.   Speech is normal.  Cranial nerves: Extraocular muscles are normal. Facial strength and sensation is normal. Trapezius strength is normal.. No dysarthria is noted.  The tongue is midline, and the patient has symmetric elevation of the soft palate.   Motor:  Muscle bulk is normal. Tone is mildly increased in her legs.. Strength is  5 / 5 in all 4 extremities.   Sensory: She has intact sensation to touch and vibration in the arms and legs...  Coordination: Cerebellar testing reveals good finger-nose-finger  bilaterally.  Gait and station: Station is normal and gait is slightly wide. Tandem gait is wide. Romberg is negative..   Reflexes: Deep tendon reflexes are symmetric and brisk bilaterally with spread a the knees but no ankle clonus.         DIAGNOSTIC DATA (LABS, IMAGING, TESTING) - I reviewed patient records, labs, notes, testing and imaging myself where available.  Lab Results  Component Value Date   WBC 3.5 06/19/2017   HGB 13.5 06/19/2017   HCT 41.1 06/19/2017   MCV 91 06/19/2017   PLT 235 06/19/2017      Component Value Date/Time   NA 138 05/23/2017 1247   NA 142 03/17/2017 0937   K 4.1 05/23/2017 1247   CL 96 (L) 05/23/2017 1247   CO2 29 05/23/2017 1247   GLUCOSE 81 05/23/2017 1247   BUN 16 05/23/2017 1247   BUN 19 03/17/2017 0937   CREATININE 0.79 05/23/2017 1247   CALCIUM 9.7 05/23/2017 1247   PROT 7.3 05/23/2017 1247   PROT 7.3 03/17/2017 0937   ALBUMIN 4.3 05/23/2017 1247   ALBUMIN  4.8 03/17/2017 0937   AST 19 05/23/2017 1247   ALT 20 05/23/2017 1247   ALKPHOS 94 05/23/2017 1247   BILITOT 1.0 05/23/2017 1247   BILITOT 0.6 03/17/2017 0937   GFRNONAA 83 05/23/2017 1247   GFRAA >89 05/23/2017 1247   Lab Results  Component Value Date   CHOL 196 12/25/2014   HDL 92 12/25/2014   LDLCALC 91 12/25/2014   TRIG 65 12/25/2014   CHOLHDL 2.1 12/25/2014   Lab Results  Component Value Date   HGBA1C  03/10/2010    5.5 (NOTE) The ADA recommends the following therapeutic goal for glycemic control related to Hgb A1c measurement: Goal of therapy: <6.5 Hgb A1c  Reference: American Diabetes Association: Clinical Practice Recommendations 2010, Diabetes Care, 2010, 33: (Suppl  1).      ASSESSMENT AND PLAN  Multiple sclerosis (HCC) - Plan: CBC with Differential/Platelet  Abnormality of gait  Attention deficit disorder, unspecified hyperactivity presence  Dysesthesia  Disturbed cognition  PHN (postherpetic neuralgia)  Chronic midline low back pain without  sciatica   1.   She will continue Tecfidera. We will check labs today.  2.   Continue Ritalin. I also refilled hydrocodone for her dysesthetic pain. If LBP does not get any better she should follow up with Dr. Ethelene Halamos for another epidural steroid injection.  3.   Refill B-12 and syringes.  4.   Additionally, I will have her try Trileptal to see if that helps her dysesthetic pain better than the gabapentin.  5.   Remain active, try to exercise.   6.  rtc 4-5 months, sooner if problems.    Shaili Donalson A. Epimenio FootSater, MD, PhD 10/30/2017, 12:09 PM Certified in Neurology, Clinical Neurophysiology, Sleep Medicine, Pain Medicine and Neuroimaging  [facility] [address], [address] [place], [place] [address] [phone] ]

## 2017-10-31 ENCOUNTER — Telehealth: Payer: Self-pay | Admitting: *Deleted

## 2017-10-31 LAB — CBC WITH DIFFERENTIAL/PLATELET
Basophils Absolute: 0 10*3/uL (ref 0.0–0.2)
Basos: 1 %
EOS (ABSOLUTE): 0 10*3/uL (ref 0.0–0.4)
Eos: 1 %
Hematocrit: 40.3 % (ref 34.0–46.6)
Hemoglobin: 13.1 g/dL (ref 11.1–15.9)
Immature Grans (Abs): 0 10*3/uL (ref 0.0–0.1)
Immature Granulocytes: 0 %
Lymphocytes Absolute: 1.4 10*3/uL (ref 0.7–3.1)
Lymphs: 42 %
MCH: 29.4 pg (ref 26.6–33.0)
MCHC: 32.5 g/dL (ref 31.5–35.7)
MCV: 91 fL (ref 79–97)
Monocytes Absolute: 0.4 10*3/uL (ref 0.1–0.9)
Monocytes: 11 %
Neutrophils Absolute: 1.5 10*3/uL (ref 1.4–7.0)
Neutrophils: 45 %
Platelets: 255 10*3/uL (ref 150–379)
RBC: 4.45 x10E6/uL (ref 3.77–5.28)
RDW: 14.3 % (ref 12.3–15.4)
WBC: 3.3 10*3/uL — ABNORMAL LOW (ref 3.4–10.8)

## 2017-10-31 NOTE — Telephone Encounter (Signed)
I have spoken with [name] this afternoon and reviewed below lab results.  She verbalized understanding of same/fim

## 2017-10-31 NOTE — Telephone Encounter (Signed)
-----   Message from Asa Lente, MD sent at 10/31/2017 12:53 PM EST ----- Lab work is okay. The white blood cells were borderline low but not enough to be concerned about.

## 2017-11-30 ENCOUNTER — Other Ambulatory Visit: Payer: Self-pay | Admitting: Neurology

## 2017-12-01 ENCOUNTER — Other Ambulatory Visit: Payer: Self-pay | Admitting: *Deleted

## 2017-12-01 NOTE — Telephone Encounter (Signed)
Xanax refill Rx successfully faxed to [facility] [place]. Dr Marjory Lies refilled for Dr Epimenio Foot who is out of office.

## 2017-12-07 ENCOUNTER — Telehealth: Payer: Self-pay | Admitting: Neurology

## 2017-12-07 NOTE — Telephone Encounter (Signed)
Not sure if sx. related to lower back problems that she sees [facility] for, or to MS.  She is going to have an MRI at [facility], would like to see RAS first to see if he would like additional imaging, so that MRI's can be done at once/fim

## 2017-12-07 NOTE — Telephone Encounter (Signed)
Pt called she is wanting to know if she should have MRI cervical spine prior to seeing orthopedic for problems with walking. She is wanting to make sure it is not MS related before she sees ortho provider. Please call to discuss.  Thank you

## 2017-12-07 NOTE — Telephone Encounter (Signed)
Spoke with [name] this afternoon.  She feels walking, upper body strength are worse over the last 2 wks.  Appt. given with RAS/fim

## 2017-12-11 ENCOUNTER — Ambulatory Visit: Payer: 59 | Admitting: Neurology

## 2017-12-11 ENCOUNTER — Other Ambulatory Visit: Payer: Self-pay

## 2017-12-11 ENCOUNTER — Encounter: Payer: Self-pay | Admitting: Neurology

## 2017-12-11 VITALS — BP 145/84 | HR 68 | Resp 16 | Ht 65.0 in | Wt 137.0 lb

## 2017-12-11 DIAGNOSIS — M546 Pain in thoracic spine: Secondary | ICD-10-CM | POA: Diagnosis not present

## 2017-12-11 DIAGNOSIS — G35 Multiple sclerosis: Secondary | ICD-10-CM | POA: Diagnosis not present

## 2017-12-11 DIAGNOSIS — M545 Low back pain: Secondary | ICD-10-CM | POA: Diagnosis not present

## 2017-12-11 DIAGNOSIS — F988 Other specified behavioral and emotional disorders with onset usually occurring in childhood and adolescence: Secondary | ICD-10-CM | POA: Diagnosis not present

## 2017-12-11 DIAGNOSIS — R269 Unspecified abnormalities of gait and mobility: Secondary | ICD-10-CM | POA: Diagnosis not present

## 2017-12-11 DIAGNOSIS — G3281 Cerebellar ataxia in diseases classified elsewhere: Secondary | ICD-10-CM

## 2017-12-11 DIAGNOSIS — G8929 Other chronic pain: Secondary | ICD-10-CM | POA: Diagnosis not present

## 2017-12-11 DIAGNOSIS — M542 Cervicalgia: Secondary | ICD-10-CM | POA: Diagnosis not present

## 2017-12-11 DIAGNOSIS — R208 Other disturbances of skin sensation: Secondary | ICD-10-CM

## 2017-12-11 MED ORDER — METHYLPHENIDATE HCL 10 MG PO TABS: ORAL_TABLET | ORAL | 0 refills | Status: DC

## 2017-12-11 MED ORDER — HYDROCODONE-ACETAMINOPHEN 7.5-325 MG PO TABS: ORAL_TABLET | ORAL | 0 refills | Status: DC

## 2017-12-11 NOTE — Progress Notes (Signed)
[facility]  PATIENT: [name] DOB: 1961/09/03 REASON FOR VISIT: Multiple sclerosis   HISTORICAL  CHIEF COMPLAINT:  Chief Complaint  Patient presents with  . Multiple Sclerosis    Sts. she continues to tolerate Tecfidera well and denies missed doses. Today c/o increased right sided weakness, decreased upper body strength, onset 2-3 weeks ago and worse in the last 4-5 days.  Stopped  Trileptal after a week b/c she felt h/a's were worse with it.  Not sure if it helped dysesthesias/fim    HISTORY OF PRESENT ILLNESS:  [name] is a 57 year old woman with multiple sclerosis diagnosed in 2008.    Update 12/11/2017:  She feels weaker on the right side.     This started 2 weeks ago with dragging of the right foot, but is much worse the past 5-6 days.    Weakness is arm, trunk and leg and she leans to the right even with sitting.   She notes she is less able to carry items in her.    Of note, she was having more pain down the arm before the symptoms started.    She notes lower neck pain but no arm pain .    MRI cervical spine in 2016 showed  Foci to the right at C3, to the left at C4, to the left at C5-C6 (only seen on axial images) and medial at C6-C7. There is also a focus in the thoracic spine at T2.     Trileptal increased her headaches and she stopped after one week.   She is also on hydrocodone and gabapentin for pain.    She notes some bladder frequency but is uncertain of this has changed any. No change in bowel.  She tolerates Tecfidera well.       Update 10/30/2017:    She is on Tecfidera and tolerates it well.   She has no recent exacerbation.   She is noting more of a right foot drop.   This seems associated with LBP the past few days.   She notes some weakness in that leg.     She notes having a lumbar spine MRI last year [employer]) and was told she has spinal stenosis.  I reviewed the MRI report which we had faxed to [place]. At L3-L4 she has  mild spinal stenosis and also has facet and ligamentous hypertrophy at L4-L5 and L5S1 she has moderate sized disc bulges but does not have spinal stenosis at those levels.  She had an ESI by Dr. Ethelene Hal late 2017 and had benefit until recently.    The truncal dysesthesias bother her mostly at night.  She Is on gabapentin 600 mg po bid and it makes her sleepy if she goes to higher dose.    She notes no change in her bladder and wears pads since she has occasional incontinence.  No new visual problems.  She is sleeping ok on current med's.   She notes ritalin helps fatigue and cognitive fog/focus.    ___________________________________ From 06/19/2017: Since the last visit, she has had bronchitis and feels weaker and more fatigued.   She is having trouble Ritalin 20 mg.    MS:   She was feeling much more fatigued earlier this year.   Vit D was low (13.2) 03/17/2017.   CBC showed low WBC and slightly low neutrophils and normal lymphocytes.   She is on Tecfidera and denies exacerbation and she tolerates it well.   Last lymphocyte count was  1.1 in  April.    She notes cognitive and physical fatigue are bad and today is a worse than typical day..          Gait/Strength/sensation:   She feels walking is worse and balance is poor.    The right leg drags if she walks more than a short distance.   She also has right greater than left leg clumsiness.  She uses a cane often and has no recent falls..    She has dysesthesias in her feet and milder in her hands.    Pain is burning and left > right up to the mid shin.    She gets a tight sensation and a poking sensation in the thoracic spine region.   Baclofen and tizanidine were not well tolerated.  PHN:   She had shingles at the sternum and down left arm in December 2016 after her knee surgery.   She now takes only 600 mg gabapentin at night as higher doses were not well tolerated.  She is also on hydrocodone up to 6/day.   Shingles occurred at time of left knee  surgery  Bladder:   She has had some bladder dysfunction and has mild incontinence. She there is some hesitancy but perhaps also was not tolerated.    Vision:    She is doing about the same with occasional diplopia, worse when tired.   An early exacerbation was diplopia.    No h/o optic neuritis.   No color asymmetry.   Fatigue/sleep:   She reports fatigue is both physical and cognitive. Ritalin (brand name) has helped her fatigue more than anything else.. The fatigue is worse in the heat.    She has some attentional deficits helped by Ritalin.  Sleep is better on gabapentin at night.   Xanax help sleep onset.   She has nocturia x 4 some night but usually falls back asleep  Mood/cognition:  She has a mild depression and mild anxiety.  She feels these issues are stable.  She had a severe depression around 2010 and was on an antidepressant but did not tolerate it well.  She also has had some anxiety helped by Xanax..   She notes a brain fog at times and  Ritalin has helped a lot and she tolerates it well.      She has some word finding problems and decreased attention.    MS History:  In 2008, she had an MRI of the brain performed which was consistent with multiple sclerosis and she was diagnosed with relapsing remitting MS. He was initially placed on Avonex for therapy. She had some exacerbations with weakness and clumsiness with her gait and she started to see Dr. Leotis Shames. He placed her on Tysabri she is on Tysabri between 2010 and 2013. 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      REVIEW OF SYSTEMS:  Constitutional: No fevers, chills, sweats, or change in appetite.  She notes fatigue Eyes: No visual changes,eye pain.   She has intermittent diplopia Ear, nose and throat: No hearing loss, ear pain, nasal congestion, sore throat Cardiovascular: No chest pain, palpitations Respiratory:  No shortness of  breath at rest or with exertion.   No wheezes GastrointestinaI: No nausea, vomiting, diarrhea, abdominal pain, fecal incontinence Genitourinary:  a sabove Musculoskeletal:  she has both neck pain and back pain. Also bilat knee pain  Integumentary: No rash, pruritus, skin lesions.   She has Raynauds in her hands  Neurological: as above Psychiatric: No depression at this time.  Anxiety doing ok on med's Endocrine: No palpitations, diaphoresis, change in appetite, change in weigh or increased thirst Hematologic/Lymphatic:  No anemia, purpura, petechiae. Allergic/Immunologic: No itchy/runny eyes, nasal congestion, recent allergic reactions, rashes  ALLERGIES: Allergies  Allergen Reactions  . Ibuprofen Other (See Comments)    CAUSES LYMPHEDEMA  . Nsaids Other (See Comments)    AVOIDS ANY SODIUM CONTAINING MED. -- CAUSES LYMPHEDEMA  . Other Swelling and Other (See Comments)    Laxatives- causes leg swelling    HOME MEDICATIONS: Outpatient Medications Prior to Visit  Medication Sig Dispense Refill  . ALPRAZolam (XANAX) 0.5 MG tablet TAKE 1 TABLET BY MOUTH 3 TIMES A DAY AS NEEDED FOR ANXIETY OR SLEEP 90 tablet 0  . calcium-vitamin D 250-100 MG-UNIT tablet Take 1 tablet by mouth 2 (two) times daily.    . cyanocobalamin (,VITAMIN B-12,) 1000 MCG/ML injection Inject 1 mL (1,000 mcg total) into the muscle every 30 (thirty) days. 10 mL 3  . Dimethyl Fumarate (TECFIDERA) 240 MG CPDR Take 1 capsule (240 mg total) by mouth 2 (two) times daily. 180 capsule 3  . gabapentin (NEURONTIN) 600 MG tablet Take 1 tablet (600 mg total) by mouth 2 (two) times daily. 180 tablet 3  . hyoscyamine (LEVSIN SL) 0.125 MG SL tablet Place 1 tablet (0.125 mg total) under the tongue every 4 (four) hours as needed. 270 tablet 3  . Magnesium 200 MG TABS Take 200 mg by mouth daily.    . metoprolol (LOPRESSOR) 50 MG tablet Take 0.5 tablets (25 mg total) by mouth 2 (two) times daily as needed (racing heartbeat). 30 tablet 0  .  NON FORMULARY Take 1 tablet by mouth See admin instructions. MALIC ACID-TAKES 1 TAB IN EVENING AND 1 TAB AT BEDTIME    . Syringe/Needle, Disp, (SYRINGE 3CC/23GX1") 23G X 1" 3 ML MISC 3 Syringes by Does not apply route every 30 (thirty) days. 3 each 3  . Vitamin D, Ergocalciferol, (DRISDOL) 50000 units CAPS capsule Take 1 capsule (50,000 Units total) by mouth every 7 (seven) days. 26 capsule 0  . HYDROcodone-acetaminophen (NORCO) 7.5-325 MG tablet TAKE 2 TABLETS BY MOUTH EVERY 8 HOURS AS NEEDED 180 tablet 0  . methylphenidate (RITALIN) 10 MG tablet BRAND NAME NECESSARY.    Take six po daily in split doses 180 tablet 0  . azithromycin (ZITHROMAX) 250 MG tablet 2 po day 1 followed by one po days 2-5 6 tablet 0  . ondansetron (ZOFRAN) 4 MG tablet Take 1 tablet (4 mg total) by mouth every 8 (eight) hours as needed for nausea or vomiting. 20 tablet 0  . Oxcarbazepine (TRILEPTAL) 300 MG tablet One po qAM and two po qHS (Patient not taking: Reported on 12/11/2017) 90 tablet 11   Facility-Administered Medications Prior to Visit  Medication Dose Route Frequency Provider Last Rate Last Dose  . gadopentetate dimeglumine (MAGNEVIST) injection 12 mL  12 mL Intravenous Once PRN [name], Richard A, MD      . gadopentetate dimeglumine (MAGNEVIST) injection 15 mL  15 mL Intravenous Once PRN [name], Pearletha Furl, MD        PAST MEDICAL HISTORY: Past Medical History:  Diagnosis Date  . ADD (attention deficit disorder)   . Anxiety   . Arthritis   . Bladder incontinence    wears a pad  . Bone spur    cervical bone  spurs  . Chronic fatigue   . Dysrhythmia    hx A-FIB  . 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 Headache(784.0)   . History of concussion YOUNG ADULT--  NO RESIDUAL  . IBS (irritable bowel syndrome)   . Movement disorder   . MS (multiple sclerosis) (HCC)   . MVP (mitral valve prolapse) HX HEART RACING  YRS AGO   ASYMPTOMATIC SINCE THEN  . Vision abnormalities   . Weakness of right leg  SECONDARY TO MS    PAST SURGICAL HISTORY: Past Surgical History:  Procedure Laterality Date  . CESAREAN SECTION  X3   BILATERAL TUBAL LIGATION W/ LAST ONE  . DESTRUCTION OF ANAL CONDYLOMA  07-07-2005  DR TOTH  . HERNIA REPAIR    . KNEE ARTHROSCOPY WITH LATERAL MENISECTOMY  10/26/2012   Procedure: KNEE ARTHROSCOPY WITH LATERAL MENISECTOMY;  Surgeon: Javier Docker, MD;  Location: [facility] [place];  Service: Orthopedics;  Laterality: Left;  Left Knee Arthrsocopy with Partial Lateral Menisectomy  . LAPAROSCOPIC ASSISTED VAGINAL HYSTERECTOMY  01-15-2002  DR Gerlene Burdock HOLLAND/ DR TIM DAVIS   AND REPAIR VENTRAL HERNIA  . LASER ABLATION CONDOLAMATA N/A 04/26/2013   Procedure: LASER ABLATION CONDOLAMATA;  Surgeon: Romie Levee, MD;  Location: [facility];  Service: General;  Laterality: N/A;  . TONSILLECTOMY    . TOTAL KNEE ARTHROPLASTY Left 11/05/2015   Procedure: LEFT TOTAL KNEE ARTHROPLASTY;  Surgeon: Jene Every, MD;  Location: [facility];  Service: Orthopedics;  Laterality: Left;    FAMILY HISTORY: Family History  Problem Relation Age of Onset  . Diabetes Mother   . Hypertension Mother   . Stroke Mother   . Bowel Disease Brother   . Diabetes Brother   . Healthy Brother   . Emphysema Father   . Heart attack Father     SOCIAL HISTORY:  Social History   Socioeconomic History  . Marital status: Married    Spouse name: Not on file  . Number of children: 3  . Years of education: Not on file  . Highest education level: Not on file  Social Needs  . Financial resource strain: Not on file  . Food insecurity - worry: Not on file  . Food insecurity - inability: Not on file  . Transportation needs - medical: Not on file  . Transportation needs - non-medical: Not on file  Occupational History  . Not on file  Tobacco Use  . Smoking status: Never Smoker  . Smokeless tobacco: Never Used  Substance and Sexual Activity  . Alcohol use: No  . Drug use: No  .  Sexual activity: Not on file  Other Topics Concern  . Not on file  Social History Narrative  . Not on file     PHYSICAL EXAM  Vitals:   12/11/17 0829  BP: (!) 145/84  Pulse: 68  Resp: 16  Weight: 137 lb (62.1 kg)  Height: 5\' 5"  (1.651 m)    Body mass index is 22.8 kg/m.   General: The patient is well-developed and well-nourished and in no acute distress   Neurologic Exam  Mental status: The patient is alert and oriented x 3 at the time of the examination. The patient has apparent normal recent and remote memory, with an apparently normal attention span and concentration ability.   Speech is normal.  Cranial nerves: Extraocular muscles are normal. Facial strength and sensation is normal. Trapezius strength is normal.. No dysarthria is noted.  The tongue is  midline, and the patient has symmetric elevation of the soft palate.   Motor:  Muscle bulk is normal. Tone is mildly increased in her legs.. Strength is  5 / 5 in all 4 extremities.   Sensory: She has intact sensation to touch and vibration in the arms and legs...  Coordination: Cerebellar testing reveals slowed rapid alternating movements and mild clumsiness in the right hand. She also has reduced heel-to-shin the right leg   Gait and station: Station is normal and gait is wide. She cannot do a tandem gait. Romberg is borderline...   Reflexes: Deep tendon reflexes are symmetric and brisk bilaterally with spread at the knees. There is no ankle clonus.       DIAGNOSTIC DATA (LABS, IMAGING, TESTING) - I reviewed patient records, labs, notes, testing and imaging myself where available.  Lab Results  Component Value Date   WBC 3.3 (L) 10/30/2017   HGB 13.1 10/30/2017   HCT 40.3 10/30/2017   MCV 91 10/30/2017   PLT 255 10/30/2017      Component Value Date/Time   NA 138 05/23/2017 1247   NA 142 03/17/2017 0937   K 4.1 05/23/2017 1247   CL 96 (L) 05/23/2017 1247   CO2 29 05/23/2017 1247   GLUCOSE 81 05/23/2017  1247   BUN 16 05/23/2017 1247   BUN 19 03/17/2017 0937   CREATININE 0.79 05/23/2017 1247   CALCIUM 9.7 05/23/2017 1247   PROT 7.3 05/23/2017 1247   PROT 7.3 03/17/2017 0937   ALBUMIN 4.3 05/23/2017 1247   ALBUMIN 4.8 03/17/2017 0937   AST 19 05/23/2017 1247   ALT 20 05/23/2017 1247   ALKPHOS 94 05/23/2017 1247   BILITOT 1.0 05/23/2017 1247   BILITOT 0.6 03/17/2017 0937   GFRNONAA 83 05/23/2017 1247   GFRAA >89 05/23/2017 1247   Lab Results  Component Value Date   CHOL 196 12/25/2014   HDL 92 12/25/2014   LDLCALC 91 12/25/2014   TRIG 65 12/25/2014   CHOLHDL 2.1 12/25/2014   Lab Results  Component Value Date   HGBA1C  03/10/2010    5.5 (NOTE) The ADA recommends the following therapeutic goal for glycemic control related to Hgb A1c measurement: Goal of therapy: <6.5 Hgb A1c  Reference: American Diabetes Association: Clinical Practice Recommendations 2010, Diabetes Care, 2010, 33: (Suppl  1).      ASSESSMENT AND PLAN  Abnormality of gait  Cerebellar ataxia in diseases classified elsewhere [facility]) - Plan: MR CERVICAL SPINE W WO CONTRAST, MR THORACIC SPINE W WO CONTRAST  Multiple sclerosis (HCC) - Plan: MR CERVICAL SPINE W WO CONTRAST, MR THORACIC SPINE W WO CONTRAST  Attention deficit disorder, unspecified hyperactivity presence  Dysesthesia  Chronic midline low back pain without sciatica  Neck pain  Midline thoracic back pain, unspecified chronicity   1.   She will continue Tecfidera.   If MRI shows  New lesions, consider switch to [place] or Ocrevus. 2.   Continue Ritalin and hydrocodone.   She will re-try Trileptal for her dysesthetic pain.  3.    MRI of the cervical and thoracic spine to investigate the changes and right-sided strength and coordination and gait that is accompanied by an increase in neck and back pain.  This will help [place] determine if she has a new MS plaque or if the degenerative changes in the cervical spine have worsened to moderate or severe  spinal stenosis/myelopathy.   If there are changes on the MRI in the spinal cord we would need to  consider a different disease modifying therapy for her MS. If she does have progression of her cervical spine degenerative changes causing severe or moderate spinal stenosis, consider referral to a surgeon.  4.   Remain active, try to exercise.   5   rtc 4 months, sooner if problems.    Richard A. Epimenio Foot, MD, PhD 12/11/2017, 9:08 AM Certified in Neurology, Clinical Neurophysiology, Sleep Medicine, Pain Medicine and Neuroimaging  [facility] [address], [address] [place], [place] [address] [phone] ]

## 2017-12-13 ENCOUNTER — Ambulatory Visit (INDEPENDENT_AMBULATORY_CARE_PROVIDER_SITE_OTHER): Payer: 59

## 2017-12-13 DIAGNOSIS — G35 Multiple sclerosis: Secondary | ICD-10-CM

## 2017-12-13 DIAGNOSIS — G3281 Cerebellar ataxia in diseases classified elsewhere: Secondary | ICD-10-CM

## 2017-12-13 MED ORDER — GADOPENTETATE DIMEGLUMINE 469.01 MG/ML IV SOLN: 15.0000 mL | Freq: Once | INTRAVENOUS | Status: DC | PRN

## 2017-12-15 ENCOUNTER — Telehealth: Payer: Self-pay | Admitting: *Deleted

## 2017-12-15 NOTE — Telephone Encounter (Signed)
Spoke with [name] this am and reviewed below MRI results.  She verbalized understanding of same./fim

## 2017-12-15 NOTE — Telephone Encounter (Signed)
-----   Message from Asa Lente, MD sent at 12/13/2017  6:49 PM EST ----- Please let her know if the MRI of the cervicothoracic spine show about a half dozen MS plaques. They all look to be old and there were no definite changes in the cervical spine compared to the previous MRI.  She does have some degenerative changes at C5-C6 with mild spinal stenosis. These changes do seem worse on the current MRI than on the 2016 MRI but are not bad enough for surgery.

## 2017-12-21 ENCOUNTER — Other Ambulatory Visit: Payer: Self-pay | Admitting: Neurology

## 2018-01-03 ENCOUNTER — Telehealth: Payer: Self-pay | Admitting: Neurology

## 2018-01-03 NOTE — Telephone Encounter (Signed)
I spoke with Dr. Epimenio Foot and he recommends rest and fluids. I spoke with patient and advised her of this and made her aware that if her symptoms worsen or persist she should see her PCP.   Patient is also wondering if Dr. Epimenio Foot would increase the frequency of her B12 injections to twice a month rather than once a month. I advised her that I would speak with him and call her back.

## 2018-01-03 NOTE — Telephone Encounter (Signed)
Pt called she was dx with the flu last Thursday 12/28/17, she is still very sick this week.  She has not went to the Dr but she's been exposed by her young grandchildren. Pt said she had 3 steroid infusions prior to the flu. She said this feels different than having side effects from the infusions. She is wanting to know if there is anything she needs to be doing. Please call to advise.

## 2018-01-04 NOTE — Telephone Encounter (Signed)
Per Dr. Epimenio Foot, it is ok to increase her B12 frequency to Twice a month.

## 2018-01-08 ENCOUNTER — Telehealth: Payer: Self-pay | Admitting: Neurology

## 2018-01-08 MED ORDER — HYDROCODONE-ACETAMINOPHEN 7.5-325 MG PO TABS: ORAL_TABLET | ORAL | 0 refills | Status: DC

## 2018-01-08 MED ORDER — CYANOCOBALAMIN 1000 MCG/ML IJ SOLN: INTRAMUSCULAR | 3 refills | Status: DC

## 2018-01-08 MED ORDER — METHYLPHENIDATE HCL 10 MG PO TABS: ORAL_TABLET | ORAL | 0 refills | Status: DC

## 2018-01-08 NOTE — Telephone Encounter (Signed)
Ritalin rx. up front [facility]/fim 

## 2018-01-08 NOTE — Addendum Note (Signed)
Addended by: Candis Schatz I on: 01/08/2018 02:08 PM   Modules accepted: Orders

## 2018-01-08 NOTE — Telephone Encounter (Signed)
Patient aware.

## 2018-01-08 NOTE — Telephone Encounter (Signed)
Hydrocodone rx's up front [facility]/fim

## 2018-01-08 NOTE — Telephone Encounter (Signed)
Vit. B12 escribed to [facility]. Ritalin and Hydrocodone rx's awaiting RAS sig/fim

## 2018-01-08 NOTE — Telephone Encounter (Signed)
Patient requesting refill of HYDROcodone-acetaminophen (NORCO) 7.5-325 MG tablet and methylphenidate (RITALIN) 10 MG tablet. Patient also needs refill for B-12 since increasing to twice a month. [facility] call [facility] [place].

## 2018-02-01 ENCOUNTER — Other Ambulatory Visit: Payer: Self-pay | Admitting: Diagnostic Neuroimaging

## 2018-02-08 ENCOUNTER — Telehealth: Payer: Self-pay | Admitting: Neurology

## 2018-02-08 MED ORDER — METHYLPHENIDATE HCL 10 MG PO TABS: ORAL_TABLET | ORAL | 0 refills | Status: DC

## 2018-02-08 MED ORDER — HYDROCODONE-ACETAMINOPHEN 7.5-325 MG PO TABS: ORAL_TABLET | ORAL | 0 refills | Status: DC

## 2018-02-08 NOTE — Telephone Encounter (Signed)
Pt requesting a refill for HYDROcodone-acetaminophen (NORCO) 7.5-325 MG tablet and methylphenidate (RITALIN) 10 MG tablet, pt is aware office will close at 12 tomorrow

## 2018-02-08 NOTE — Telephone Encounter (Signed)
Rx's awaiting RAS sig/fim 

## 2018-02-08 NOTE — Telephone Encounter (Signed)
Rx's up front [facility]/fim 

## 2018-02-26 ENCOUNTER — Other Ambulatory Visit: Payer: Self-pay

## 2018-02-26 ENCOUNTER — Ambulatory Visit: Payer: 59 | Admitting: Neurology

## 2018-02-26 ENCOUNTER — Encounter: Payer: Self-pay | Admitting: Neurology

## 2018-02-26 VITALS — BP 141/91 | HR 71 | Resp 18 | Ht 65.0 in | Wt 136.5 lb

## 2018-02-26 DIAGNOSIS — M545 Low back pain, unspecified: Secondary | ICD-10-CM

## 2018-02-26 DIAGNOSIS — R51 Headache: Secondary | ICD-10-CM | POA: Diagnosis not present

## 2018-02-26 DIAGNOSIS — R269 Unspecified abnormalities of gait and mobility: Secondary | ICD-10-CM | POA: Diagnosis not present

## 2018-02-26 DIAGNOSIS — G8929 Other chronic pain: Secondary | ICD-10-CM | POA: Diagnosis not present

## 2018-02-26 DIAGNOSIS — G35 Multiple sclerosis: Secondary | ICD-10-CM

## 2018-02-26 DIAGNOSIS — R208 Other disturbances of skin sensation: Secondary | ICD-10-CM

## 2018-02-26 DIAGNOSIS — F988 Other specified behavioral and emotional disorders with onset usually occurring in childhood and adolescence: Secondary | ICD-10-CM | POA: Diagnosis not present

## 2018-02-26 DIAGNOSIS — R5382 Chronic fatigue, unspecified: Secondary | ICD-10-CM | POA: Diagnosis not present

## 2018-02-26 MED ORDER — METHYLPHENIDATE HCL 10 MG PO TABS: ORAL_TABLET | ORAL | 0 refills | Status: DC

## 2018-02-26 MED ORDER — HYDROCODONE-ACETAMINOPHEN 7.5-325 MG PO TABS: ORAL_TABLET | ORAL | 0 refills | Status: DC

## 2018-02-26 NOTE — Progress Notes (Signed)
[facility]  PATIENT: [name] DOB: 04/27/1961 REASON FOR VISIT: Multiple sclerosis   HISTORICAL  CHIEF COMPLAINT:  Chief Complaint  Patient presents with  . Multiple Sclerosis    Sts. she continues to tolerate Tecfidera well.  Still has difficulty with intermittent fatigue, but this is not new or worse/fim    HISTORY OF PRESENT ILLNESS:  [name] is a 57 year old woman with multiple sclerosis diagnosed in 2008.    Update 02/26/2018: She denies any new exacerbations since her last visit.  She is on Tecfidera.  She tolerates it well.  She may have had one exacerbation at the end of last year.  A little before her 2019/01/21visit, she was experiencing more right  sided weakness.  MRI of the cervical and thoracic spine was checked.  She has multiple T2 hyperintense lesions in the spinal cord the largest to the right C2-C3 and smaller ones at C3-C4, C4, C5-C6, C6-C7 and T1-T2 all the foci were present compared to the 2016 MRI.   The MRI of the thoracic spine showed small foci adjacent to T2 and T5-6.  None of the spinal plaques appeared to be acute.      Her gait is back to baseline and she is walking without a cane.  She still gets intermittent episodes where she will feel more off balance.  She notes mild weakness and clumsiness on the right side right now.  She gets burning pain, helped by gabapentin.   Addition of Trileptal did not help further and she stopped.  She is on hydrocodone 7.5 up to 6 a day for her chronic back pain, dysesthesias and headaches.  She has not shown any drug-seeking behavior  Bladder function is variable with urgency and rare incontinence.  Vision is stable.  She continues to report fatigue and also has decreased focus and attention.  Both issues are helped by Ritalin.  She tolerates it well.   She has heat intolerance.    Mood is doing fairly well.   She was more down in December 2016/12/25/19due to death of 2 people she knew.      She has LBP and ESI's have helped in the past ([place]).    Update 12/11/2017:  She feels weaker on the right side.     This started 2 weeks ago with dragging of the right foot, but is much worse the past 5-6 days.    Weakness is arm, trunk and leg and she leans to the right even with sitting.   She notes she is less able to carry items in her.    Of note, she was having more pain down the arm before the symptoms started.    She notes lower neck pain but no arm pain .    MRI cervical spine in 2016 showed Foci to the right at C3, to the left at C4, to the left at C5-C6 (only seen on axial images) and medial at C6-C7. There is also a focus in the thoracic spine at T2.     Trileptal increased her headaches and she stopped after one week.   She is also on hydrocodone and gabapentin for pain.    She notes some bladder frequency but is uncertain of this has changed any. No change in bowel.  She tolerates Tecfidera well.       Update 10/30/2017:    She is on Tecfidera and tolerates it well.   She has no recent exacerbation.  She is noting more of a right foot drop.   This seems associated with LBP the past few days.   She notes some weakness in that leg.     She notes having a lumbar spine MRI last year [employer]) and was told she has spinal stenosis.  I reviewed the MRI report which we had faxed to [place]. At L3-L4 she has mild spinal stenosis and also has facet and ligamentous hypertrophy at L4-L5 and L5S1 she has moderate sized disc bulges but does not have spinal stenosis at those levels.  She had an ESI by Dr. Ethelene Hal late 2017 and had benefit until recently.    The truncal dysesthesias bother her mostly at night.  She Is on gabapentin 600 mg po bid and it makes her sleepy if she goes to higher dose.    She notes no change in her bladder and wears pads since she has occasional incontinence.  No new visual problems.  She is sleeping ok on current med's.   She notes ritalin helps fatigue and  cognitive fog/focus.    ___________________________________ From 06/19/2017: Since the last visit, she has had bronchitis and feels weaker and more fatigued.   She is having trouble Ritalin 20 mg.    MS:   She was feeling much more fatigued earlier this year.   Vit D was low (13.2) 03/17/2017.   CBC showed low WBC and slightly low neutrophils and normal lymphocytes.   She is on Tecfidera and denies exacerbation and she tolerates it well.   Last lymphocyte count was 1.1 in  April.    She notes cognitive and physical fatigue are bad and today is a worse than typical day..          Gait/Strength/sensation:   She feels walking is worse and balance is poor.    The right leg drags if she walks more than a short distance.   She also has right greater than left leg clumsiness.  She uses a cane often and has no recent falls..    She has dysesthesias in her feet and milder in her hands.    Pain is burning and left > right up to the mid shin.    She gets a tight sensation and a poking sensation in the thoracic spine region.   Baclofen and tizanidine were not well tolerated.  PHN:   She had shingles at the sternum and down left arm in December 2016 after her knee surgery.   She now takes only 600 mg gabapentin at night as higher doses were not well tolerated.  She is also on hydrocodone up to 6/day.   Shingles occurred at time of left knee surgery  Bladder:   She has had some bladder dysfunction and has mild incontinence. She there is some hesitancy but perhaps also was not tolerated.    Vision:    She is doing about the same with occasional diplopia, worse when tired.   An early exacerbation was diplopia.    No h/o optic neuritis.   No color asymmetry.   Fatigue/sleep:   She reports fatigue is both physical and cognitive. Ritalin (brand name) has helped her fatigue more than anything else.. The fatigue is worse in the heat.    She has some attentional deficits helped by Ritalin.  Sleep is better on gabapentin at  night.   Xanax help sleep onset.   She has nocturia x 4 some night but usually falls back asleep  Mood/cognition:  She has a mild depression and mild anxiety.  She feels these issues are stable.  She had a severe depression around 2010 and was on an antidepressant but did not tolerate it well.  She also has had some anxiety helped by Xanax..   She notes a brain fog at times and  Ritalin has helped a lot and she tolerates it well.      She has some word finding problems and decreased attention.    MS History:  In 2008, she had an MRI of the brain performed which was consistent with multiple sclerosis and she was diagnosed with relapsing remitting MS. He was initially placed on Avonex for therapy. She had some exacerbations with weakness and clumsiness with her gait and she started to see Dr. Leotis Shames. He placed her on Tysabri she is on Tysabri between 2010 and 2013. 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      REVIEW OF SYSTEMS:  Constitutional: No fevers, chills, sweats, or change in appetite.  She notes fatigue Eyes: No visual changes,eye pain.   She has intermittent diplopia Ear, nose and throat: No hearing loss, ear pain, nasal congestion, sore throat Cardiovascular: No chest pain, palpitations Respiratory:  No shortness of breath at rest or with exertion.   No wheezes GastrointestinaI: No nausea, vomiting, diarrhea, abdominal pain, fecal incontinence Genitourinary:  a sabove Musculoskeletal:  she has both neck pain and back pain. Also bilat knee pain Integumentary: No rash, pruritus, skin lesions.   She has Raynauds in her hands  Neurological: as above Psychiatric: No depression at this time.  Anxiety doing ok on med's Endocrine: No palpitations, diaphoresis, change in appetite, change in weigh or increased thirst Hematologic/Lymphatic:  No anemia, purpura, petechiae. Allergic/Immunologic: No  itchy/runny eyes, nasal congestion, recent allergic reactions, rashes  ALLERGIES: Allergies  Allergen Reactions  . Ibuprofen Other (See Comments)    CAUSES LYMPHEDEMA  . Nsaids Other (See Comments)    AVOIDS ANY SODIUM CONTAINING MED. -- CAUSES LYMPHEDEMA  . Other Swelling and Other (See Comments)    Laxatives- causes leg swelling    HOME MEDICATIONS: Outpatient Medications Prior to Visit  Medication Sig Dispense Refill  . ALPRAZolam (XANAX) 0.5 MG tablet TAKE 1 TABLET BY MOUTH 3 TIMES A DAY AS NEEDED FOR ANXIETY OR SLEEP 90 tablet 0  . calcium-vitamin D 250-100 MG-UNIT tablet Take 1 tablet by mouth 2 (two) times daily.    . cyanocobalamin (,VITAMIN B-12,) 1000 MCG/ML injection 1,067mcg IM twice per month 10 mL 3  . gabapentin (NEURONTIN) 600 MG tablet Take 1 tablet (600 mg total) by mouth 2 (two) times daily. 180 tablet 3  . hyoscyamine (LEVSIN SL) 0.125 MG SL tablet Place 1 tablet (0.125 mg total) under the tongue every 4 (four) hours as needed. 270 tablet 3  . Magnesium 200 MG TABS Take 200 mg by mouth daily.    . metoprolol (LOPRESSOR) 50 MG tablet Take 0.5 tablets (25 mg total) by mouth 2 (two) times daily as needed (racing heartbeat). 30 tablet 0  . NON FORMULARY Take 1 tablet by mouth See admin instructions. MALIC ACID-TAKES 1 TAB IN EVENING AND 1 TAB AT BEDTIME    . ondansetron (ZOFRAN) 4 MG tablet Take 1 tablet (4 mg total) by mouth every 8 (eight) hours as needed for nausea or vomiting. 20 tablet 0  . Oxcarbazepine (TRILEPTAL) 300 MG tablet  One po qAM and two po qHS 90 tablet 11  . Syringe/Needle, Disp, (SYRINGE 3CC/23GX1") 23G X 1" 3 ML MISC 3 Syringes by Does not apply route every 30 (thirty) days. 3 each 3  . TECFIDERA 240 MG CPDR TAKE 1 CAPSULE (240MG ) BY MOUTH TWICE DAILY 180 capsule 3  . Vitamin D, Ergocalciferol, (DRISDOL) 50000 units CAPS capsule Take 1 capsule (50,000 Units total) by mouth every 7 (seven) days. 26 capsule 0  . HYDROcodone-acetaminophen (NORCO) 7.5-325  MG tablet TAKE 2 TABLETS BY MOUTH EVERY 8 HOURS AS NEEDED 180 tablet 0  . methylphenidate (RITALIN) 10 MG tablet BRAND NAME NECESSARY.    Take six po daily in split doses 180 tablet 0  . azithromycin (ZITHROMAX) 250 MG tablet 2 po day 1 followed by one po days 2-5 6 tablet 0   Facility-Administered Medications Prior to Visit  Medication Dose Route Frequency Provider Last Rate Last Dose  . gadopentetate dimeglumine (MAGNEVIST) injection 12 mL  12 mL Intravenous Once PRN Cable Fearn A, MD      . gadopentetate dimeglumine (MAGNEVIST) injection 15 mL  15 mL Intravenous Once PRN Chae Oommen A, MD      . gadopentetate dimeglumine (MAGNEVIST) injection 15 mL  15 mL Intravenous Once PRN Shamel Germond, Pearletha Furl, MD        PAST MEDICAL HISTORY: Past Medical History:  Diagnosis Date  . ADD (attention deficit disorder)   . Anxiety   . Arthritis   . Bladder incontinence    wears a pad  . Bone spur    cervical bone spurs  . Chronic fatigue   . Dysrhythmia    hx A-FIB  . 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 Headache(784.0)   . History of concussion YOUNG ADULT--  NO RESIDUAL  . IBS (irritable bowel syndrome)   . Movement disorder   . MS (multiple sclerosis) (HCC)   . MVP (mitral valve prolapse) HX HEART RACING  YRS AGO   ASYMPTOMATIC SINCE THEN  . Vision abnormalities   . Weakness of right leg SECONDARY TO MS    PAST SURGICAL HISTORY: Past Surgical History:  Procedure Laterality Date  . CESAREAN SECTION  X3   BILATERAL TUBAL LIGATION W/ LAST ONE  . DESTRUCTION OF ANAL CONDYLOMA  07-07-2005  DR TOTH  . HERNIA REPAIR    . KNEE ARTHROSCOPY WITH LATERAL MENISECTOMY  10/26/2012   Procedure: KNEE ARTHROSCOPY WITH LATERAL MENISECTOMY;  Surgeon: Javier Docker, MD;  Location: [facility];  Service: Orthopedics;  Laterality: Left;  Left Knee Arthrsocopy with Partial Lateral Menisectomy  . LAPAROSCOPIC ASSISTED VAGINAL HYSTERECTOMY  01-15-2002  DR Gerlene Burdock HOLLAND/ DR TIM  DAVIS   AND REPAIR VENTRAL HERNIA  . LASER ABLATION CONDOLAMATA N/A 04/26/2013   Procedure: LASER ABLATION CONDOLAMATA;  Surgeon: Romie Levee, MD;  Location: [facility];  Service: General;  Laterality: N/A;  . TONSILLECTOMY    . TOTAL KNEE ARTHROPLASTY Left 11/05/2015   Procedure: LEFT TOTAL KNEE ARTHROPLASTY;  Surgeon: Jene Every, MD;  Location: [facility];  Service: Orthopedics;  Laterality: Left;    FAMILY HISTORY: Family History  Problem Relation Age of Onset  . Diabetes Mother   . Hypertension Mother   . Stroke Mother   . Bowel Disease Brother   . Diabetes Brother   . Healthy Brother   . Emphysema Father   . Heart attack Father     SOCIAL HISTORY:  Social History   Socioeconomic History  .  Marital status: Married    Spouse name: Not on file  . Number of children: 3  . Years of education: Not on file  . Highest education level: Not on file  Occupational History  . Not on file  Social Needs  . Financial resource strain: Not on file  . Food insecurity:    Worry: Not on file    Inability: Not on file  . Transportation needs:    Medical: Not on file    Non-medical: Not on file  Tobacco Use  . Smoking status: Never Smoker  . Smokeless tobacco: Never Used  Substance and Sexual Activity  . Alcohol use: No  . Drug use: No  . Sexual activity: Not on file  Lifestyle  . Physical activity:    Days per week: Not on file    Minutes per session: Not on file  . Stress: Not on file  Relationships  . Social connections:    Talks on phone: Not on file    Gets together: Not on file    Attends religious service: Not on file    Active member of club or organization: Not on file    Attends meetings of clubs or organizations: Not on file    Relationship status: Not on file  . Intimate partner violence:    Fear of current or ex partner: Not on file    Emotionally abused: Not on file    Physically abused: Not on file    Forced sexual activity: Not on file    Other Topics Concern  . Not on file  Social History Narrative  . Not on file     PHYSICAL EXAM  Vitals:   02/26/18 1133  BP: (!) 141/91  Pulse: 71  Resp: 18  Weight: 136 lb 8 oz (61.9 kg)  Height: 5\' 5"  (1.651 m)    Body mass index is 22.71 kg/m.   General: The patient is well-developed and well-nourished and in no acute distress   Neurologic Exam  Mental status: The patient is alert and oriented x 3 at the time of the examination. The patient has apparent normal recent and remote memory, with an apparently normal attention span and concentration ability.   Speech is normal.  Cranial nerves: The extraocular muscles are intact.  Her facial strength and sensation is normal.  Trapezius strength is strong.  There was no dysarthria  noted.  The tongue is midline, and the patient has symmetric elevation of the soft palate.   Motor:  Muscle bulk is normal. Tone is mildly increased in her legs.. Strength is  5 / 5 in all 4 extremities.   Sensory: She has intact sensation to touch and vibration in the arms and legs...  Coordination: Cerebellar testing shows mild right reduced heel to shin.  Finger to nose was normal today  Gait and station: Station is normal and gait is mildly wide.  The tandem gait is moderately wide.  It. Romberg is borderline...   Reflexes: Deep tendon reflexes are symmetric and brisk bilaterally with spread at the knees. There is no ankle clonus.       DIAGNOSTIC DATA (LABS, IMAGING, TESTING) - I reviewed patient records, labs, notes, testing and imaging myself where available.  Lab Results  Component Value Date   WBC 3.3 (L) 10/30/2017   HGB 13.1 10/30/2017   HCT 40.3 10/30/2017   MCV 91 10/30/2017   PLT 255 10/30/2017      Component Value Date/Time   NA 138  05/23/2017 1247   NA 142 03/17/2017 0937   K 4.1 05/23/2017 1247   CL 96 (L) 05/23/2017 1247   CO2 29 05/23/2017 1247   GLUCOSE 81 05/23/2017 1247   BUN 16 05/23/2017 1247   BUN 19  03/17/2017 0937   CREATININE 0.79 05/23/2017 1247   CALCIUM 9.7 05/23/2017 1247   PROT 7.3 05/23/2017 1247   PROT 7.3 03/17/2017 0937   ALBUMIN 4.3 05/23/2017 1247   ALBUMIN 4.8 03/17/2017 0937   AST 19 05/23/2017 1247   ALT 20 05/23/2017 1247   ALKPHOS 94 05/23/2017 1247   BILITOT 1.0 05/23/2017 1247   BILITOT 0.6 03/17/2017 0937   GFRNONAA 83 05/23/2017 1247   GFRAA >89 05/23/2017 1247   Lab Results  Component Value Date   CHOL 196 12/25/2014   HDL 92 12/25/2014   LDLCALC 91 12/25/2014   TRIG 65 12/25/2014   CHOLHDL 2.1 12/25/2014   Lab Results  Component Value Date   HGBA1C  03/10/2010    5.5 (NOTE) The ADA recommends the following therapeutic goal for glycemic control related to Hgb A1c measurement: Goal of therapy: <6.5 Hgb A1c  Reference: American Diabetes Association: Clinical Practice Recommendations 2010, Diabetes Care, 2010, 33: (Suppl  1).      ASSESSMENT AND PLAN  Multiple sclerosis (HCC) - Plan: CBC with Differential/Platelet  Abnormality of gait  Attention deficit disorder, unspecified hyperactivity presence  Chronic fatigue  Chronic nonintractable headache, unspecified headache type  Chronic midline low back pain without sciatica  Dysesthesia   1.   Continue Tecfidera, check CBC with Differential.   If MRI shows  New lesions, consider switch to [place] or Ocrevus. 2.   Continue Ritalin and hydrocodone.  The [place] controlled substance database was reviewed.   continue Gabapentin.    New prescription for these today 3.    Remain active, try to exercise.   5    rtc 4 months, sooner if problems.    Ronnald Shedden A. Epimenio Foot, MD, PhD 02/26/2018, 1:16 PM Certified in Neurology, Clinical Neurophysiology, Sleep Medicine, Pain Medicine and Neuroimaging  [facility] [address], [address] [place], [place] [address] [phone] ]

## 2018-02-27 ENCOUNTER — Telehealth: Payer: Self-pay | Admitting: *Deleted

## 2018-02-27 LAB — CBC WITH DIFFERENTIAL/PLATELET
Basophils Absolute: 0 10*3/uL (ref 0.0–0.2)
Basos: 1 %
EOS (ABSOLUTE): 0 10*3/uL (ref 0.0–0.4)
Eos: 0 %
Hematocrit: 41.7 % (ref 34.0–46.6)
Hemoglobin: 13.8 g/dL (ref 11.1–15.9)
Immature Grans (Abs): 0 10*3/uL (ref 0.0–0.1)
Immature Granulocytes: 0 %
Lymphocytes Absolute: 1.2 10*3/uL (ref 0.7–3.1)
Lymphs: 32 %
MCH: 29.7 pg (ref 26.6–33.0)
MCHC: 33.1 g/dL (ref 31.5–35.7)
MCV: 90 fL (ref 79–97)
Monocytes Absolute: 0.4 10*3/uL (ref 0.1–0.9)
Monocytes: 11 %
Neutrophils Absolute: 2.1 10*3/uL (ref 1.4–7.0)
Neutrophils: 56 %
Platelets: 234 10*3/uL (ref 150–379)
RBC: 4.64 x10E6/uL (ref 3.77–5.28)
RDW: 14.2 % (ref 12.3–15.4)
WBC: 3.7 10*3/uL (ref 3.4–10.8)

## 2018-02-27 NOTE — Telephone Encounter (Signed)
I spoke with [name] this morning and reviewed below lab results.  She verbalized understanding of same/fim

## 2018-02-27 NOTE — Telephone Encounter (Signed)
-----   Message from Asa Lente, MD sent at 02/27/2018  9:53 AM EDT ----- Please let the patient know that the lab work is fine.

## 2018-05-09 ENCOUNTER — Telehealth: Payer: Self-pay | Admitting: *Deleted

## 2018-05-09 ENCOUNTER — Other Ambulatory Visit: Payer: Self-pay | Admitting: Neurology

## 2018-05-09 MED ORDER — HYDROCODONE-ACETAMINOPHEN 7.5-325 MG PO TABS: ORAL_TABLET | ORAL | 0 refills | Status: DC

## 2018-05-09 MED ORDER — METHYLPHENIDATE HCL 10 MG PO TABS: ORAL_TABLET | ORAL | 0 refills | Status: DC

## 2018-05-09 NOTE — Addendum Note (Signed)
Addended by: Candis Schatz I on: 05/09/2018 09:19 AM   Modules accepted: Orders

## 2018-05-09 NOTE — Addendum Note (Signed)
Addended by: Candis Schatz I on: 05/09/2018 09:13 AM   Modules accepted: Orders

## 2018-05-09 NOTE — Telephone Encounter (Signed)
Ritalin escribed to [facility].  Hydrocodone awaiting RAS sig and will place up front GNA/fim

## 2018-05-09 NOTE — Telephone Encounter (Signed)
Patient requesting refill of HYDROcodone-acetaminophen (NORCO) 7.5-325 MG tablet and methylphenidate (RITALIN) 10 MG tablet [facility] [place].

## 2018-05-09 NOTE — Telephone Encounter (Signed)
Hydrocodone rx. up front [facility]/fim 

## 2018-06-11 ENCOUNTER — Telehealth: Payer: Self-pay | Admitting: Neurology

## 2018-06-11 MED ORDER — METHYLPHENIDATE HCL 10 MG PO TABS: ORAL_TABLET | ORAL | 0 refills | Status: DC

## 2018-06-11 MED ORDER — HYDROCODONE-ACETAMINOPHEN 7.5-325 MG PO TABS: ORAL_TABLET | ORAL | 0 refills | Status: DC

## 2018-06-11 NOTE — Telephone Encounter (Signed)
Rx's up front [facility]/fim 

## 2018-06-11 NOTE — Telephone Encounter (Signed)
Pt requesting refills for HYDROcodone-acetaminophen (NORCO) 7.5-325 MG tablet and methylphenidate (RITALIN) 10 MG tablet. Stating she will need medications today if possible.

## 2018-06-11 NOTE — Telephone Encounter (Signed)
Rx's awaiting RAS sig/fim 

## 2018-07-02 ENCOUNTER — Ambulatory Visit: Payer: 59 | Admitting: Neurology

## 2018-07-02 ENCOUNTER — Encounter: Payer: Self-pay | Admitting: Neurology

## 2018-07-02 VITALS — BP 143/84 | HR 71 | Ht 65.0 in | Wt 142.5 lb

## 2018-07-02 DIAGNOSIS — G8929 Other chronic pain: Secondary | ICD-10-CM

## 2018-07-02 DIAGNOSIS — M545 Low back pain, unspecified: Secondary | ICD-10-CM

## 2018-07-02 DIAGNOSIS — R4189 Other symptoms and signs involving cognitive functions and awareness: Secondary | ICD-10-CM

## 2018-07-02 DIAGNOSIS — M546 Pain in thoracic spine: Secondary | ICD-10-CM

## 2018-07-02 DIAGNOSIS — G35 Multiple sclerosis: Secondary | ICD-10-CM

## 2018-07-02 DIAGNOSIS — N3941 Urge incontinence: Secondary | ICD-10-CM

## 2018-07-02 DIAGNOSIS — R269 Unspecified abnormalities of gait and mobility: Secondary | ICD-10-CM

## 2018-07-02 DIAGNOSIS — F988 Other specified behavioral and emotional disorders with onset usually occurring in childhood and adolescence: Secondary | ICD-10-CM | POA: Diagnosis not present

## 2018-07-02 MED ORDER — METHYLPHENIDATE HCL 10 MG PO TABS: ORAL_TABLET | ORAL | 0 refills | Status: DC

## 2018-07-02 MED ORDER — HYDROCODONE-ACETAMINOPHEN 7.5-325 MG PO TABS: ORAL_TABLET | ORAL | 0 refills | Status: DC

## 2018-07-02 NOTE — Progress Notes (Signed)
[facility]  PATIENT: [name] DOB: 08/11/1961 REASON FOR VISIT: Multiple sclerosis   HISTORICAL  CHIEF COMPLAINT:  Chief Complaint  Patient presents with  . Multiple Sclerosis    Patient reports that her walking has been a little off and [name] also has some urinary issues.     HISTORY OF PRESENT ILLNESS:  [name] is a 57 year old woman with multiple sclerosis diagnosed in 2008.    Update 07/02/2018: [name] feels her MS is slightly worse, with m,ore gait and bladder issues.   [name] is on Tecfidera and [name] tolerates it well.     Her gait is mildly worse and [name] falls at last once a month but stumbles frequently.   Outside her house is not flat.   [name] has a right foot drop.  [name] also has right knee pain (has had surgery for torn meniscus).   [name] also has lower bak pain and in the past was told [name]  Had lumbar spinal stenosis.   Dr. Ethelene Hal has done [facility] in the past (2016 and 2017).   [name] has dysesthesias and pain in her thoracic spine.    [name] never took the oxcarbazepine.    Her bladder is doing worse.   [name] now wears pads all the time.  [name] has incontinence almost daily.    [name] has no hesitancy and feels that [name] empties.     [name] is not on any medication.   [name] was once on a bladder medication but had side effects and stopped.    [name] has a lot of fatigue and walks worse when more tired.   Fatigue is worse with heat.  Focus and attention have improved on Ritalin.  It also helps her fatigue some.  Update 02/26/2018: [name] denies any new exacerbations since her last visit.  [name] is on Tecfidera.  [name] tolerates it well.  [name] may have had one exacerbation at the end of last year.  A little before her 01/22/19visit, [name] was experiencing more right  sided weakness.  MRI of the cervical and thoracic spine was checked.  [name] has multiple T2 hyperintense lesions in the spinal cord the largest to the right C2-C3 and smaller ones at C3-C4, C4, C5-C6, C6-C7 and T1-T2 all the foci  were present compared to the 2016 MRI.   The MRI of the thoracic spine showed small foci adjacent to T2 and T5-6.  None of the spinal plaques appeared to be acute.      Her gait is back to baseline and [name] is walking without a cane.  [name] still gets intermittent episodes where [name] will feel more off balance.  [name] notes mild weakness and clumsiness on the right side right now.  [name] gets burning pain, helped by gabapentin.   Addition of Trileptal did not help further and [name] stopped.  [name] is on hydrocodone 7.5 up to 6 a day for her chronic back pain, dysesthesias and headaches.  [name] has not shown any drug-seeking behavior  Bladder function is variable with urgency and rare incontinence.  Vision is stable.  [name] continues to report fatigue and also has decreased focus and attention.  Both issues are helped by Ritalin.  [name] tolerates it well.   [name] has heat intolerance.    Mood is doing fairly well.   [name] was more down in December 2016/12/26/19due to death of 2 people [name] knew.     [name] has LBP and ESI's have helped in  the past ([place]).    Update 12/11/2017:  [name] feels weaker on the right side.     This started 2 weeks ago with dragging of the right foot, but is much worse the past 5-6 days.    Weakness is arm, trunk and leg and [name] leans to the right even with sitting.   [name] notes [name] is less able to carry items in her.    Of note, [name] was having more pain down the arm before the symptoms started.    [name] notes lower neck pain but no arm pain .    MRI cervical spine in 2016 showed Foci to the right at C3, to the left at C4, to the left at C5-C6 (only seen on axial images) and medial at C6-C7. There is also a focus in the thoracic spine at T2.     Trileptal increased her headaches and [name] stopped after one week.   [name] is also on hydrocodone and gabapentin for pain.    [name] notes some bladder frequency but is uncertain of this has changed any. No change in bowel.  [name] tolerates Tecfidera well.       Update  10/30/2017:    [name] is on Tecfidera and tolerates it well.   [name] has no recent exacerbation.   [name] is noting more of a right foot drop.   This seems associated with LBP the past few days.   [name] notes some weakness in that leg.     [name] notes having a lumbar spine MRI last year [employer]) and was told [name] has spinal stenosis.  I reviewed the MRI report which we had faxed to [place]. At L3-L4 [name] has mild spinal stenosis and also has facet and ligamentous hypertrophy at L4-L5 and L5S1 [name] has moderate sized disc bulges but does not have spinal stenosis at those levels.  [name] had an ESI by Dr. Ethelene Hal late 2017 and had benefit until recently.    The truncal dysesthesias bother her mostly at night.  [name] Is on gabapentin 600 mg po bid and it makes her sleepy if [name] goes to higher dose.    [name] notes no change in her bladder and wears pads since [name] has occasional incontinence.  No new visual problems.  [name] is sleeping ok on current med's.   [name] notes ritalin helps fatigue and cognitive fog/focus.    ___________________________________ From 06/19/2017: Since the last visit, [name] has had bronchitis and feels weaker and more fatigued.   [name] is having trouble Ritalin 20 mg.    MS:   [name] was feeling much more fatigued earlier this year.   Vit D was low (13.2) 03/17/2017.   CBC showed low WBC and slightly low neutrophils and normal lymphocytes.   [name] is on Tecfidera and denies exacerbation and [name] tolerates it well.   Last lymphocyte count was 1.1 in  April.    [name] notes cognitive and physical fatigue are bad and today is a worse than typical day..          Gait/Strength/sensation:   [name] feels walking is worse and balance is poor.    The right leg drags if [name] walks more than a short distance.   [name] also has right greater than left leg clumsiness.  [name] uses a cane often and has no recent falls..    [name] has dysesthesias in her feet and milder in her hands.    Pain is burning and left > right up to the  mid shin.     [name] gets a tight sensation and a poking sensation in the thoracic spine region.   Baclofen and tizanidine were not well tolerated.  PHN:   [name] had shingles at the sternum and down left arm in December 2016 after her knee surgery.   [name] now takes only 600 mg gabapentin at night as higher doses were not well tolerated.  [name] is also on hydrocodone up to 6/day.   Shingles occurred at time of left knee surgery  Bladder:   [name] has had some bladder dysfunction and has mild incontinence. [name] there is some hesitancy but perhaps also was not tolerated.    Vision:    [name] is doing about the same with occasional diplopia, worse when tired.   An early exacerbation was diplopia.    No h/o optic neuritis.   No color asymmetry.   Fatigue/sleep:   [name] reports fatigue is both physical and cognitive. Ritalin (brand name) has helped her fatigue more than anything else.. The fatigue is worse in the heat.    [name] has some attentional deficits helped by Ritalin.  Sleep is better on gabapentin at night.   Xanax help sleep onset.   [name] has nocturia x 4 some night but usually falls back asleep  Mood/cognition:  [name] has a mild depression and mild anxiety.  [name] feels these issues are stable.  [name] had a severe depression around 2010 and was on an antidepressant but did not tolerate it well.  [name] also has had some anxiety helped by Xanax..   [name] notes a brain fog at times and  Ritalin has helped a lot and [name] tolerates it well.      [name] has some word finding problems and decreased attention.    MS History:  In 2008, [name] had an MRI of the brain performed which was consistent with multiple sclerosis and [name] was diagnosed with relapsing remitting MS. He was initially placed on Avonex for therapy. [name] had some exacerbations with weakness and clumsiness with her gait and [name] started to see Dr. Leotis Shames. He placed her on Tysabri [name] is on Tysabri between 2010 and 2013. 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      REVIEW OF SYSTEMS:  Constitutional: No fevers, chills, sweats, or change in appetite.  [name] notes fatigue Eyes: No visual changes,eye pain.   [name] has intermittent diplopia Ear, nose and throat: No hearing loss, ear pain, nasal congestion, sore throat Cardiovascular: No chest pain, palpitations Respiratory:  No shortness of breath at rest or with exertion.   No wheezes GastrointestinaI: No nausea, vomiting, diarrhea, abdominal pain, fecal incontinence Genitourinary:  a sabove Musculoskeletal:  [name] has both neck pain and back pain. Also bilat knee pain Integumentary: No rash, pruritus, skin lesions.   [name] has Raynauds in her hands  Neurological: as above Psychiatric: No depression at this time.  Anxiety doing ok on med's Endocrine: No palpitations, diaphoresis, change in appetite, change in weigh or increased thirst Hematologic/Lymphatic:  No anemia, purpura, petechiae. Allergic/Immunologic: No itchy/runny eyes, nasal congestion, recent allergic reactions, rashes  ALLERGIES: Allergies  Allergen Reactions  . Ibuprofen Other (See Comments)    CAUSES LYMPHEDEMA  . Nsaids Other (See Comments)    AVOIDS ANY SODIUM CONTAINING MED. -- CAUSES LYMPHEDEMA  . Other Swelling and Other (See Comments)    Laxatives- causes leg swelling  HOME MEDICATIONS: Outpatient Medications Prior to Visit  Medication Sig Dispense Refill  . ALPRAZolam (XANAX) 0.5 MG tablet TAKE 1 TABLET BY MOUTH 3 TIMES A DAY AS NEEDED FOR ANXIETY OR SLEEP 90 tablet 0  . calcium-vitamin D 250-100 MG-UNIT tablet Take 1 tablet by mouth 2 (two) times daily.    . cyanocobalamin (,VITAMIN B-12,) 1000 MCG/ML injection 1,019mcg IM twice per month 10 mL 3  . gabapentin (NEURONTIN) 600 MG tablet Take 1 tablet (600 mg total) by mouth 2 (two) times daily. 180 tablet 3  . hyoscyamine (LEVSIN SL) 0.125 MG SL tablet Place 1 tablet (0.125 mg total) under the tongue every 4  (four) hours as needed. 270 tablet 3  . Magnesium 200 MG TABS Take 200 mg by mouth daily.    . metoprolol (LOPRESSOR) 50 MG tablet Take 0.5 tablets (25 mg total) by mouth 2 (two) times daily as needed (racing heartbeat). 30 tablet 0  . NON FORMULARY Take 1 tablet by mouth See admin instructions. MALIC ACID-TAKES 1 TAB IN EVENING AND 1 TAB AT BEDTIME    . ondansetron (ZOFRAN) 4 MG tablet Take 1 tablet (4 mg total) by mouth every 8 (eight) hours as needed for nausea or vomiting. 20 tablet 0  . Syringe/Needle, Disp, (SYRINGE 3CC/23GX1") 23G X 1" 3 ML MISC 3 Syringes by Does not apply route every 30 (thirty) days. 3 each 3  . TECFIDERA 240 MG CPDR TAKE 1 CAPSULE (240MG ) BY MOUTH TWICE DAILY 180 capsule 3  . Vitamin D, Ergocalciferol, (DRISDOL) 50000 units CAPS capsule Take 1 capsule (50,000 Units total) by mouth every 7 (seven) days. 26 capsule 0  . HYDROcodone-acetaminophen (NORCO) 7.5-325 MG tablet TAKE 2 TABLETS BY MOUTH EVERY 8 HOURS AS NEEDED 180 tablet 0  . methylphenidate (RITALIN) 10 MG tablet BRAND NAME NECESSARY.    Take six po daily in split doses 180 tablet 0  . Oxcarbazepine (TRILEPTAL) 300 MG tablet One po qAM and two po qHS (Patient not taking: Reported on 07/02/2018) 90 tablet 11  . azithromycin (ZITHROMAX) 250 MG tablet 2 po day 1 followed by one po days 2-5 6 tablet 0   Facility-Administered Medications Prior to Visit  Medication Dose Route Frequency Provider Last Rate Last Dose  . gadopentetate dimeglumine (MAGNEVIST) injection 12 mL  12 mL Intravenous Once PRN Allaya Abbasi A, MD      . gadopentetate dimeglumine (MAGNEVIST) injection 15 mL  15 mL Intravenous Once PRN Tylia Ewell A, MD      . gadopentetate dimeglumine (MAGNEVIST) injection 15 mL  15 mL Intravenous Once PRN Marrianne Sica, Pearletha Furl, MD        PAST MEDICAL HISTORY: Past Medical History:  Diagnosis Date  . ADD (attention deficit disorder)   . Anxiety   . Arthritis   . Bladder incontinence    wears a pad  . Bone  spur    cervical bone spurs  . Chronic fatigue   . Dysrhythmia    hx A-FIB  . 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 Headache(784.0)   . History of concussion YOUNG ADULT--  NO RESIDUAL  . IBS (irritable bowel syndrome)   . Movement disorder   . MS (multiple sclerosis) (HCC)   . MVP (mitral valve prolapse) HX HEART RACING  YRS AGO   ASYMPTOMATIC SINCE THEN  . Vision abnormalities   . Weakness of right leg SECONDARY TO MS    PAST SURGICAL HISTORY: Past Surgical History:  Procedure Laterality Date  .  CESAREAN SECTION  X3   BILATERAL TUBAL LIGATION W/ LAST ONE  . DESTRUCTION OF ANAL CONDYLOMA  07-07-2005  DR TOTH  . HERNIA REPAIR    . KNEE ARTHROSCOPY WITH LATERAL MENISECTOMY  10/26/2012   Procedure: KNEE ARTHROSCOPY WITH LATERAL MENISECTOMY;  Surgeon: Javier Docker, MD;  Location: [facility] ;  Service: Orthopedics;  Laterality: Left;  Left Knee Arthrsocopy with Partial Lateral Menisectomy  . LAPAROSCOPIC ASSISTED VAGINAL HYSTERECTOMY  01-15-2002  DR Gerlene Burdock HOLLAND/ DR TIM DAVIS   AND REPAIR VENTRAL HERNIA  . LASER ABLATION CONDOLAMATA N/A 04/26/2013   Procedure: LASER ABLATION CONDOLAMATA;  Surgeon: Romie Levee, MD;  [facility];  Service: General;  Laterality: N/A;  . TONSILLECTOMY    . TOTAL KNEE ARTHROPLASTY Left 11/05/2015   Procedure: LEFT TOTAL KNEE ARTHROPLASTY;  Surgeon: Jene Every, MD;  Location: [facility];  Service: Orthopedics;  Laterality: Left;    FAMILY HISTORY: Family History  Problem Relation Age of Onset  . Diabetes Mother   . Hypertension Mother   . Stroke Mother   . Bowel Disease Brother   . Diabetes Brother   . Healthy Brother   . Emphysema Father   . Heart attack Father     SOCIAL HISTORY:  Social History   Socioeconomic History  . Marital status: Married    Spouse name: Not on file  . Number of children: 3  . Years of education: Not on file  . Highest education level: Not on file    Occupational History  . Not on file  Social Needs  . Financial resource strain: Not on file  . Food insecurity:    Worry: Not on file    Inability: Not on file  . Transportation needs:    Medical: Not on file    Non-medical: Not on file  Tobacco Use  . Smoking status: Never Smoker  . Smokeless tobacco: Never Used  Substance and Sexual Activity  . Alcohol use: No  . Drug use: No  . Sexual activity: Not on file  Lifestyle  . Physical activity:    Days per week: Not on file    Minutes per session: Not on file  . Stress: Not on file  Relationships  . Social connections:    Talks on phone: Not on file    Gets together: Not on file    Attends religious service: Not on file    Active member of club or organization: Not on file    Attends meetings of clubs or organizations: Not on file    Relationship status: Not on file  . Intimate partner violence:    Fear of current or ex partner: Not on file    Emotionally abused: Not on file    Physically abused: Not on file    Forced sexual activity: Not on file  Other Topics Concern  . Not on file  Social History Narrative  . Not on file     PHYSICAL EXAM  Vitals:   07/02/18 0948  BP: (!) 143/84  Pulse: 71  Weight: 142 lb 8 oz (64.6 kg)  Height: 5\' 5"  (1.651 m)    Body mass index is 23.71 kg/m.   General: The patient is well-developed and well-nourished and in no acute distress   Neurologic Exam  Mental status: The patient is alert and oriented x 3 at the time of the examination. The patient has apparent normal recent and remote memory, with an apparently normal attention span and  concentration ability.   Speech is normal.  Cranial nerves: The extraocular muscles are intact.  Facial strength and sensation is normal.  Trapezius strength is normal.  The tongue is midline, and the patient has symmetric elevation of the soft palate.   Motor:  Muscle bulk is normal. Tone is mildly increased in her legs.. Strength is  5 / 5  in all 4 extremities.   Sensory: [name] has intact sensation to touch and vibration in the arms or legs.  Coordination: Cerebellar testing shows mild right reduced heel to shin.  Finger to nose was normal today  Gait and station: Station is normal and gait is mildly wide.  The tandem gait is moderately wide.  The Romberg is mildly wide.  Reflexes: Deep tendon reflexes are symmetric and brisk bilaterally with crossed abductors at the knees. There is no ankle clonus.        DIAGNOSTIC DATA (LABS, IMAGING, TESTING) - I reviewed patient records, labs, notes, testing and imaging myself where available.  Lab Results  Component Value Date   WBC 3.7 02/26/2018   HGB 13.8 02/26/2018   HCT 41.7 02/26/2018   MCV 90 02/26/2018   PLT 234 02/26/2018      Component Value Date/Time   NA 138 05/23/2017 1247   NA 142 03/17/2017 0937   K 4.1 05/23/2017 1247   CL 96 (L) 05/23/2017 1247   CO2 29 05/23/2017 1247   GLUCOSE 81 05/23/2017 1247   BUN 16 05/23/2017 1247   BUN 19 03/17/2017 0937   CREATININE 0.79 05/23/2017 1247   CALCIUM 9.7 05/23/2017 1247   PROT 7.3 05/23/2017 1247   PROT 7.3 03/17/2017 0937   ALBUMIN 4.3 05/23/2017 1247   ALBUMIN 4.8 03/17/2017 0937   AST 19 05/23/2017 1247   ALT 20 05/23/2017 1247   ALKPHOS 94 05/23/2017 1247   BILITOT 1.0 05/23/2017 1247   BILITOT 0.6 03/17/2017 0937   GFRNONAA 83 05/23/2017 1247   GFRAA >89 05/23/2017 1247   Lab Results  Component Value Date   CHOL 196 12/25/2014   HDL 92 12/25/2014   LDLCALC 91 12/25/2014   TRIG 65 12/25/2014   CHOLHDL 2.1 12/25/2014   Lab Results  Component Value Date   HGBA1C  03/10/2010    5.5 (NOTE) The ADA recommends the following therapeutic goal for glycemic control related to Hgb A1c measurement: Goal of therapy: <6.5 Hgb A1c  Reference: American Diabetes Association: Clinical Practice Recommendations 2010, Diabetes Care, 2010, 33: (Suppl  1).      ASSESSMENT AND PLAN  Multiple sclerosis (HCC) -  Plan: CBC with Differential/Platelet  Abnormality of gait  Attention deficit disorder, unspecified hyperactivity presence  Disturbed cognition  Chronic midline low back pain without sciatica  Midline thoracic back pain, unspecified chronicity  Urge incontinence   1.  [name] will continue Tecfidera.  I will check some blood work to determine if the lymphocyte count is adequate.  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  I went over Mayzent a little bit more detail as [name] had not heard of it in the past. 2.   Continue Ritalin and hydrocodone.  The [place] controlled substance database was reviewed.   continue Gabapentin.    New prescription for these today 3.   Hyoscyamine 0.125 tid for bladder.   5    rtc 4 months, sooner if problems.  Jalina Blowers A. Epimenio Foot, MD, PhD 07/02/2018, 10:44 AM Certified in Neurology, Clinical Neurophysiology, Sleep Medicine, Pain Medicine and Neuroimaging  [facility] [address] [place], [place] [address] [phone] ]

## 2018-07-03 ENCOUNTER — Telehealth: Payer: Self-pay | Admitting: *Deleted

## 2018-07-03 LAB — CBC WITH DIFFERENTIAL/PLATELET
Basophils Absolute: 0 10*3/uL (ref 0.0–0.2)
Basos: 1 %
EOS (ABSOLUTE): 0 10*3/uL (ref 0.0–0.4)
Eos: 0 %
Hematocrit: 42.2 % (ref 34.0–46.6)
Hemoglobin: 13.9 g/dL (ref 11.1–15.9)
Immature Grans (Abs): 0 10*3/uL (ref 0.0–0.1)
Immature Granulocytes: 0 %
Lymphocytes Absolute: 1 10*3/uL (ref 0.7–3.1)
Lymphs: 31 %
MCH: 30 pg (ref 26.6–33.0)
MCHC: 32.9 g/dL (ref 31.5–35.7)
MCV: 91 fL (ref 79–97)
Monocytes Absolute: 0.4 10*3/uL (ref 0.1–0.9)
Monocytes: 11 %
Neutrophils Absolute: 1.8 10*3/uL (ref 1.4–7.0)
Neutrophils: 57 %
Platelets: 255 10*3/uL (ref 150–450)
RBC: 4.63 x10E6/uL (ref 3.77–5.28)
RDW: 12.9 % (ref 12.3–15.4)
WBC: 3.3 10*3/uL — ABNORMAL LOW (ref 3.4–10.8)

## 2018-07-03 NOTE — Telephone Encounter (Signed)
-----   Message from Asa Lente, MD sent at 07/03/2018  8:49 AM EDT ----- Please let the patient know that the lab work is fine.

## 2018-07-03 NOTE — Telephone Encounter (Signed)
Called and spoke with patient and advised labs looked ok per Dr. Epimenio Foot. Pt verbalized understanding.

## 2018-07-13 ENCOUNTER — Other Ambulatory Visit: Payer: Self-pay | Admitting: Neurology

## 2018-08-05 ENCOUNTER — Emergency Department (HOSPITAL_BASED_OUTPATIENT_CLINIC_OR_DEPARTMENT_OTHER)
Admission: EM | Admit: 2018-08-05 | Discharge: 2018-08-06 | Disposition: A | Discharge status: Home / Self Care | Payer: 59 | Attending: Emergency Medicine | Admitting: Emergency Medicine

## 2018-08-05 ENCOUNTER — Other Ambulatory Visit: Payer: Self-pay

## 2018-08-05 ENCOUNTER — Encounter (HOSPITAL_BASED_OUTPATIENT_CLINIC_OR_DEPARTMENT_OTHER): Payer: Self-pay | Admitting: *Deleted

## 2018-08-05 DIAGNOSIS — Z96652 Presence of left artificial knee joint: Secondary | ICD-10-CM | POA: Diagnosis not present

## 2018-08-05 DIAGNOSIS — R112 Nausea with vomiting, unspecified: Secondary | ICD-10-CM

## 2018-08-05 DIAGNOSIS — I480 Paroxysmal atrial fibrillation: Secondary | ICD-10-CM | POA: Diagnosis not present

## 2018-08-05 DIAGNOSIS — R197 Diarrhea, unspecified: Secondary | ICD-10-CM

## 2018-08-05 DIAGNOSIS — K529 Noninfective gastroenteritis and colitis, unspecified: Secondary | ICD-10-CM

## 2018-08-05 DIAGNOSIS — E860 Dehydration: Secondary | ICD-10-CM | POA: Insufficient documentation

## 2018-08-05 DIAGNOSIS — G35 Multiple sclerosis: Secondary | ICD-10-CM | POA: Diagnosis not present

## 2018-08-05 DIAGNOSIS — Z79899 Other long term (current) drug therapy: Secondary | ICD-10-CM | POA: Insufficient documentation

## 2018-08-05 LAB — CBC
HCT: 44.3 % (ref 36.0–46.0)
Hemoglobin: 15.1 g/dL — ABNORMAL HIGH (ref 12.0–15.0)
MCH: 31 pg (ref 26.0–34.0)
MCHC: 34.1 g/dL (ref 30.0–36.0)
MCV: 91 fL (ref 78.0–100.0)
Platelets: 209 10*3/uL (ref 150–400)
RBC: 4.87 MIL/uL (ref 3.87–5.11)
RDW: 13.7 % (ref 11.5–15.5)
WBC: 5.8 10*3/uL (ref 4.0–10.5)

## 2018-08-05 LAB — COMPREHENSIVE METABOLIC PANEL
ALT: 21 U/L (ref 0–44)
AST: 32 U/L (ref 15–41)
Albumin: 4.7 g/dL (ref 3.5–5.0)
Alkaline Phosphatase: 73 U/L (ref 38–126)
Anion gap: 13 (ref 5–15)
BUN: 23 mg/dL — ABNORMAL HIGH (ref 6–20)
CO2: 26 mmol/L (ref 22–32)
Calcium: 9 mg/dL (ref 8.9–10.3)
Chloride: 99 mmol/L (ref 98–111)
Creatinine, Ser: 0.73 mg/dL (ref 0.44–1.00)
GFR calc Af Amer: >60 mL/min (ref >60)
GFR calc non Af Amer: >60 mL/min (ref >60)
Glucose, Bld: 98 mg/dL (ref 70–99)
Potassium: 4.3 mmol/L (ref 3.5–5.1)
Sodium: 138 mmol/L (ref 135–145)
Total Bilirubin: 1.4 mg/dL — ABNORMAL HIGH (ref 0.3–1.2)
Total Protein: 7.4 g/dL (ref 6.5–8.1)

## 2018-08-05 LAB — LIPASE, BLOOD: Lipase: 26 U/L (ref 11–51)

## 2018-08-05 MED ORDER — SODIUM CHLORIDE 0.9 % IV BOLUS
1000.0000 mL | Freq: Once | INTRAVENOUS | Status: AC
  Administered 2018-08-05: 1000 mL via INTRAVENOUS

## 2018-08-05 MED ORDER — ONDANSETRON HCL 4 MG/2ML IJ SOLN
4.0000 mg | Freq: Once | INTRAMUSCULAR | Status: AC
  Administered 2018-08-05: 4 mg via INTRAVENOUS
  Filled 2018-08-05: qty 2

## 2018-08-05 NOTE — ED Provider Notes (Addendum)
[facility] EMERGENCY DEPARTMENT Provider Note   CSN: [id number] Arrival date & time: 08/05/18  2106     History   Chief Complaint Chief Complaint  Patient presents with  . Emesis    HPI [name] is a 57 y.o. female.  The history is provided by the patient, the spouse and medical records. No language interpreter was used.  Diarrhea   This is a new problem. The current episode started yesterday. The problem occurs 2 to 4 times per day. The problem has not changed since onset.The stool consistency is described as watery. There has been no fever. Associated symptoms include vomiting. Pertinent negatives include no abdominal pain, no chills, no sweats, no headaches, no URI and no cough. She has tried nothing for the symptoms. Risk factors include ill contacts.  Emesis   This is a new problem. The current episode started yesterday. The problem occurs continuously. The problem has not changed since onset.There has been no fever. Associated symptoms include diarrhea. Pertinent negatives include no abdominal pain, no chills, no cough, no fever, no headaches, no sweats and no URI.    Past Medical History:  Diagnosis Date  . ADD (attention deficit disorder)   . Anxiety   . Arthritis   . Bladder incontinence    wears a pad  . Bone spur    cervical bone spurs  . Chronic fatigue   . Dysrhythmia    hx A-FIB  . 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 Headache(784.0)   . History of concussion YOUNG ADULT--  NO RESIDUAL  . IBS (irritable bowel syndrome)   . Movement disorder   . MS (multiple sclerosis) (HCC)   . MVP (mitral valve prolapse) HX HEART RACING  YRS AGO   ASYMPTOMATIC SINCE THEN  . Vision abnormalities   . Weakness of right leg SECONDARY TO MS    Patient Active Problem List   Diagnosis Date Noted  . Urge incontinence 07/02/2018  . Cerebellar ataxia in diseases classified elsewhere (HCC) 12/11/2017  . Neck pain 12/11/2017  . Thoracic back pain  12/11/2017  . Chronic low back pain 10/30/2017  . Chronic headaches 03/17/2017  . Disturbed cognition 10/19/2016  . Dysesthesia 02/25/2016  . PHN (postherpetic neuralgia) 02/25/2016  . 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 Preop cardiovascular exam 03/20/2015  . DJD (degenerative joint disease) 02/12/2015  . Chest pain 12/28/2014  . Paroxysmal atrial fibrillation (HCC) 12/28/2014  . Multiple sclerosis (HCC) 12/22/2014  . Abnormality of gait 12/22/2014  . Attention deficit disorder 12/22/2014  . Chronic fatigue 12/22/2014  . Urinary hesitancy 12/22/2014  . Adjustment disorder with mixed anxiety and depressed mood 12/22/2014  . 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 CESAREAN SECTION  X3   BILATERAL TUBAL LIGATION W/ LAST ONE  . DESTRUCTION OF ANAL CONDYLOMA  07-07-2005  DR TOTH  . HERNIA REPAIR    . KNEE ARTHROSCOPY WITH LATERAL MENISECTOMY  10/26/2012   Procedure: KNEE ARTHROSCOPY WITH LATERAL MENISECTOMY;  Surgeon: Javier Docker, MD;  Location: [facility];  Service: Orthopedics;  Laterality: Left;  Left Knee Arthrsocopy with Partial Lateral Menisectomy  . LAPAROSCOPIC ASSISTED VAGINAL HYSTERECTOMY  01-15-2002  DR Gerlene Burdock HOLLAND/ DR TIM DAVIS   AND REPAIR VENTRAL HERNIA  . LASER ABLATION CONDOLAMATA N/A 04/26/2013   Procedure: LASER ABLATION CONDOLAMATA;  Surgeon: Romie Levee, MD;  Location: [facility];  Service: General;  Laterality: N/A;  .  TONSILLECTOMY    . TOTAL KNEE ARTHROPLASTY Left 11/05/2015   Procedure: LEFT TOTAL KNEE ARTHROPLASTY;  Surgeon: Jene Every, MD;  Location: [facility];  Service: Orthopedics;  Laterality: Left;     OB History   None      Home Medications    Prior to Admission medications   Medication Sig Start Date End Date Taking? Authorizing Provider  ALPRAZolam Prudy Feeler) 0.5 MG tablet TAKE 1 TABLET BY MOUTH 3 TIMES A DAY AS NEEDED FOR ANXIETY/SLEEP 07/13/18   [name], Pearletha Furl, MD    calcium-vitamin D 250-100 MG-UNIT tablet Take 1 tablet by mouth 2 (two) times daily.    [provider]  cyanocobalamin (,VITAMIN B-12,) 1000 MCG/ML injection 1,036mcg IM twice per month 01/08/18   [name], Richard A, MD  gabapentin (NEURONTIN) 600 MG tablet TAKE 1 TABLET (600 MG TOTAL) BY MOUTH 2 (TWO) TIMES DAILY. 07/13/18   [name], Pearletha Furl, MD  HYDROcodone-acetaminophen (NORCO) 7.5-325 MG tablet TAKE 2 TABLETS BY MOUTH EVERY 8 HOURS AS NEEDED 07/02/18   [name], Pearletha Furl, MD  hyoscyamine (LEVSIN SL) 0.125 MG SL tablet Place 1 tablet (0.125 mg total) under the tongue every 4 (four) hours as needed. 04/13/17   [name], Pearletha Furl, MD  Magnesium 200 MG TABS Take 200 mg by mouth daily.    [provider]  methylphenidate (RITALIN) 10 MG tablet BRAND NAME NECESSARY.    Take six po daily in split doses 07/02/18   [name], Pearletha Furl, MD  metoprolol (LOPRESSOR) 50 MG tablet Take 0.5 tablets (25 mg total) by mouth 2 (two) times daily as needed (racing heartbeat). 12/30/14   Kathlen Mody, MD  NON FORMULARY Take 1 tablet by mouth See admin instructions. MALIC ACID-TAKES 1 TAB IN EVENING AND 1 TAB AT BEDTIME    [provider]  ondansetron (ZOFRAN) 4 MG tablet Take 1 tablet (4 mg total) by mouth every 8 (eight) hours as needed for nausea or vomiting. 05/23/17   [name], Luanna Cole, MD  Oxcarbazepine (TRILEPTAL) 300 MG tablet One po qAM and two po qHS Patient not taking: Reported on 07/02/2018 10/30/17   [name], Pearletha Furl, MD  Syringe/Needle, Disp, (SYRINGE 3CC/23GX1") 23G X 1" 3 ML MISC 3 Syringes by Does not apply route every 30 (thirty) days. 10/30/17   [name], Pearletha Furl, MD  TECFIDERA 240 MG CPDR TAKE 1 CAPSULE (240MG ) BY MOUTH TWICE DAILY 12/21/17   [name], Pearletha Furl, MD  Vitamin D, Ergocalciferol, (DRISDOL) 50000 units CAPS capsule Take 1 capsule (50,000 Units total) by mouth every 7 (seven) days. 03/21/17   [name], Pearletha Furl, MD    Family History Family History  Problem Relation Age of Onset  .  Diabetes Mother   . Hypertension Mother   . Stroke Mother   . Bowel Disease Brother   . Diabetes Brother   . Healthy Brother   . Emphysema Father   . Heart attack Father     Social History Social History   Tobacco Use  . Smoking status: Never Smoker  . Smokeless tobacco: Never Used  Substance Use Topics  . Alcohol use: No  . Drug use: No     Allergies   Ibuprofen; Nsaids; and Other   Review of Systems Review of Systems  Constitutional: Negative for chills, diaphoresis, fatigue and fever.  HENT: Negative for congestion.   Eyes: Negative for visual disturbance.  Respiratory: Negative for cough, chest tightness and shortness of breath.   Cardiovascular: Negative for chest pain and palpitations.  Gastrointestinal:  Positive for diarrhea, nausea and vomiting. Negative for abdominal distention, abdominal pain and constipation.  Genitourinary: Negative for decreased urine volume, dysuria, flank pain and frequency.  Musculoskeletal: Negative for back pain, neck pain and neck stiffness.  Skin: Negative for rash and wound.  Neurological: Negative for light-headedness and headaches.  Psychiatric/Behavioral: Negative for agitation and confusion.  All other systems reviewed and are negative.    Physical Exam Updated Vital Signs BP (!) 144/86 (BP Location: Left Arm)   Pulse 85   Temp 99.7 F (37.6 C) (Oral)   Resp 18   Ht 5\' 5"  (1.651 m)   Wt 62.6 kg   SpO2 97%   BMI 22.96 kg/m   Physical Exam  Constitutional: She is oriented to person, place, and time. She appears well-developed and well-nourished. No distress.  HENT:  Head: Normocephalic and atraumatic.  Mouth/Throat: Oropharynx is clear and moist. No oropharyngeal exudate.  Eyes: Pupils are equal, round, and reactive to light. Conjunctivae and EOM are normal.  Neck: Normal range of motion. Neck supple.  Cardiovascular: Normal rate and regular rhythm.  No murmur heard. Pulmonary/Chest: Effort normal and breath  sounds normal. No respiratory distress. She has no wheezes. She exhibits no tenderness.  Abdominal: Soft. Bowel sounds are normal. She exhibits no distension. There is no tenderness.  Musculoskeletal: She exhibits no edema or tenderness.  Neurological: She is alert and oriented to person, place, and time. No sensory deficit. She exhibits normal muscle tone.  Skin: Skin is warm and dry. No rash noted. She is not diaphoretic. No erythema.  Psychiatric: She has a normal mood and affect.  Nursing note and vitals reviewed.    ED Treatments / Results  Labs (all labs ordered are listed, but only abnormal results are displayed) Labs Reviewed  COMPREHENSIVE METABOLIC PANEL - Abnormal; Notable for the following components:      Result Value   BUN 23 (*)    Total Bilirubin 1.4 (*)    All other components within normal limits  CBC - Abnormal; Notable for the following components:   Hemoglobin 15.1 (*)    All other components within normal limits  URINALYSIS, ROUTINE W REFLEX MICROSCOPIC - Abnormal; Notable for the following components:   Ketones, ur 40 (*)    All other components within normal limits  URINE CULTURE  LIPASE, BLOOD  PREGNANCY, URINE    EKG None  Radiology No results found.  Procedures Procedures (including critical care time)  CRITICAL CARE Performed by: Canary Brim Tegeler Total critical care time: 35 minutes Critical care time was exclusive of separately billable procedures and treating other patients. Critical care was necessary to treat or prevent imminent or life-threatening deterioration. Critical care was time spent personally by me on the following activities: development of treatment plan with patient and/or surrogate as well as nursing, discussions with consultants, evaluation of patient's response to treatment, examination of patient, obtaining history from patient or surrogate, ordering and performing treatments and interventions, ordering and review of  laboratory studies, ordering and review of radiographic studies, pulse oximetry and re-evaluation of patient's condition.   Medications Ordered in ED Medications  sodium chloride 0.9 % bolus 1,000 mL (0 mLs Intravenous Stopped 08/06/18 0057)  sodium chloride 0.9 % bolus 1,000 mL (0 mLs Intravenous Stopped 08/06/18 0057)  ondansetron (ZOFRAN) injection 4 mg (4 mg Intravenous Given 08/05/18 2244)     Initial Impression / Assessment and Plan / ED Course  I have reviewed the triage vital signs and the nursing notes.  Pertinent labs & imaging results that were available during my care of the patient were reviewed by me and considered in my medical decision making (see chart for details).     JORIE ZEE is a 57 y.o. female with a past medical history significant for paroxysmal atrial fibrillation, MS, chronic fatigue, and chronic headaches who presents with nausea, vomiting, and diarrhea.  Patient is coming by husband he reports that they have been visiting their son in the hospital for the last 2 weeks.  She reports that for the last 2 days she is been having the GI symptoms.  Husband thinks he may speak to a GI bug while in the hospital with family.  She reports feeling very fatigued and "drained".  She denies any new pains other than her chronic pain.  She denies any cough, congestion, fevers, or chills.  She denies any urinary symptoms.  On exam, lungs are clear and chest is nontender.  Patient is sleepy but arousable and appropriate.  Abdomen is nontender and chest is nontender.  Lungs are clear.  Clinically suspect patient has a gastroenteritis causing dehydration.  She was given fluids as well as nausea medicine.  She also had laboratory testing performed to look for acute kidney injury dehydration or electrode imbalance.  Labs were reassuring and patient started to perk up after fluids.  Patient stopped having nausea after medications.  Given well appearance after rehydration and lack of  other complaints, patient was felt stable for discharge home.  Given lack of tenderness and improved symptoms do not feel patient is any CT imaging at this time.  Patient will follow-up with her PCP and her MS specialist.  Do not suspect patient has an MS flare at this time.    Patient and family agreed with plan of care and had no other questions or concerns.  Patient was discharged in good condition.      Final Clinical Impressions(s) / ED Diagnoses   Final diagnoses:  Nausea vomiting and diarrhea  Gastroenteritis  Dehydration    ED Discharge Orders         Ordered    promethazine (PHENERGAN) 25 MG suppository  Every 6 hours PRN     08/06/18 0047          Clinical Impression: 1. Nausea vomiting and diarrhea   2. Gastroenteritis   3. Dehydration     Disposition: Discharge  Condition: Good  I have discussed the results, Dx and Tx plan with the pt(& family if present). He/she/they expressed understanding and agree(s) with the plan. Discharge instructions discussed at great length. Strict return precautions discussed and pt &/or family have verbalized understanding of the instructions. No further questions at time of discharge.    New Prescriptions   PROMETHAZINE (PHENERGAN) 25 MG SUPPOSITORY    Place 1 suppository (25 mg total) rectally every 6 (six) hours as needed for nausea or vomiting.    Follow Up: [facility] [place] [facility] [address] [id number] mc [address] [place] [address] [phone]    [name], MD [address] [place] [address] [phone]         Tegeler, Canary Brim, MD 08/06/18 0118    Tegeler, Canary Brim, MD 08/21/18 2030

## 2018-08-05 NOTE — ED Triage Notes (Signed)
Pt reports vomiting x 10 today. States she had been staying in the hospital with a family member for 14 days until yesterday. Took phenergan around 2045. Pt has a hx of MS

## 2018-08-05 NOTE — ED Notes (Signed)
ED Provider at bedside. 

## 2018-08-06 LAB — URINALYSIS, ROUTINE W REFLEX MICROSCOPIC
Bilirubin Urine: NEGATIVE
Glucose, UA: NEGATIVE mg/dL
Hgb urine dipstick: NEGATIVE
Ketones, ur: 40 mg/dL — AB
Leukocytes, UA: NEGATIVE
Nitrite: NEGATIVE
Protein, ur: NEGATIVE mg/dL
Specific Gravity, Urine: 1.01 (ref 1.005–1.030)
pH: 5 (ref 5.0–8.0)

## 2018-08-06 LAB — PREGNANCY, URINE: Preg Test, Ur: NEGATIVE

## 2018-08-06 MED ORDER — PROMETHAZINE HCL 25 MG RE SUPP: 25.0000 mg | Freq: Four times a day (QID) | RECTAL | 0 refills | Status: DC | PRN

## 2018-08-06 NOTE — Discharge Instructions (Signed)
Your work-up today was reassuring in regards to the blood work and exam.  We gave you fluids which help with your hydration and mental status.  We suspect you are dehydrated from the nausea vomiting diarrhea you have been going through.  Please follow-up with your primary team and your MS team.  We did not find evidence of bacterial infection today.  If any symptoms change or worsen, please return to the nearest emergency department.  Please use your rectal suppository medication to help with your nausea.

## 2018-08-07 DIAGNOSIS — R1110 Vomiting, unspecified: Secondary | ICD-10-CM

## 2018-08-07 LAB — URINE CULTURE

## 2018-08-08 ENCOUNTER — Telehealth: Payer: Self-pay | Admitting: Neurology

## 2018-08-08 MED ORDER — METHYLPHENIDATE HCL 10 MG PO TABS: ORAL_TABLET | ORAL | 0 refills | Status: DC

## 2018-08-08 MED ORDER — HYDROCODONE-ACETAMINOPHEN 7.5-325 MG PO TABS: ORAL_TABLET | ORAL | 0 refills | Status: DC

## 2018-08-08 NOTE — Addendum Note (Signed)
Addended by: Candis Schatz I on: 08/08/2018 01:44 PM   Modules accepted: Orders

## 2018-08-08 NOTE — Telephone Encounter (Signed)
Patient requesting refill of HYDROcodone-acetaminophen (NORCO) 7.5-325 MG tablet and methylphenidate (RITALIN) 10 MG tablet.

## 2018-08-08 NOTE — Telephone Encounter (Signed)
Rx's up front [facility]/fim 

## 2018-08-08 NOTE — Telephone Encounter (Signed)
Rx's awaiting RAS sig/fim 

## 2018-08-10 ENCOUNTER — Encounter: Payer: Self-pay | Admitting: Internal Medicine

## 2018-08-10 ENCOUNTER — Ambulatory Visit: Payer: 59 | Admitting: Internal Medicine

## 2018-08-10 ENCOUNTER — Ambulatory Visit
Admission: RE | Admit: 2018-08-10 | Discharge: 2018-08-10 | Disposition: A | Discharge status: Home / Self Care | Payer: 59 | Source: Ambulatory Visit | Attending: Internal Medicine | Admitting: Internal Medicine

## 2018-08-10 VITALS — BP 120/80 | HR 75 | Temp 98.0°F | Ht 65.0 in | Wt 142.0 lb

## 2018-08-10 DIAGNOSIS — R1084 Generalized abdominal pain: Secondary | ICD-10-CM

## 2018-08-10 DIAGNOSIS — R8290 Unspecified abnormal findings in urine: Secondary | ICD-10-CM | POA: Diagnosis not present

## 2018-08-10 DIAGNOSIS — G35 Multiple sclerosis: Secondary | ICD-10-CM

## 2018-08-10 LAB — POCT URINALYSIS DIPSTICK
Bilirubin, UA: NEGATIVE
Blood, UA: NEGATIVE
Glucose, UA: NEGATIVE
Ketones, UA: NEGATIVE
Nitrite, UA: NEGATIVE
Protein, UA: NEGATIVE
Spec Grav, UA: 1.01 (ref 1.010–1.025)
Urobilinogen, UA: 0.2 E.U./dL
pH, UA: 7.5 (ref 5.0–8.0)

## 2018-08-10 NOTE — Progress Notes (Signed)
   Subjective:    Patient ID: [name], female    DOB: 11-19-1961, 57 y.o.   MRN: [id number]  HPI 57 year old Female not seen since June 2018 with history of multiple sclerosis treated by Dr. Epimenio Foot at [facility] treated with Gilford Raid was seen in the Emergency department September 1 after calling me early evening complaining of repeated vomiting for several hours.  She sounded lethargic and weak and I encouraged her to go to the emergency department for evaluation.  Vomiting has started the previous day.  Was complaining of diarrhea.  In the emergency department she was afebrile.  Was given 1 L of normal saline and a Zofran injection.  I have called Phenergan and for her at her request to the pharmacy.  Patient reported she had been staying at the hospital with her son for the previous 2 weeks as he had been in a severe accident.  He is now at home with patient and her husband.  In the emergency department she was hemoconcentrated with a hemoglobin of 15.1 g and previously had a hemoglobin of 13.9 g a month ago.  BUN was 23.  Creatinine was normal.  Total bilirubin was slightly elevated at 1.4.  Her white blood cell count was normal.  Sodium and potassium were normal.  Lipase was normal.  Urine culture grew multiple species.  She called today asking to be seen urgently complaining of abdominal distention and discomfort.    Review of Systems today complaining of abdominal distention.  Says she had a bowel movement earlier today that was normal but contain some mucus.  Says nausea has resolved.     Objective:   Physical Exam Abdomen is soft nondistended without hepatosplenomegaly or masses.  Bowel sounds are slightly increased.  There is no rebound tenderness.  She is tender just above her umbilicus.       Assessment & Plan:  Abdominal pain-  Given 30 cc p.o. GI cocktail in the office.  Multiple sclerosis  Abnormal urinalysis-culture pending  Recent volume depletion secondary  to gastroenteritis treated in the emergency department September 1  Plan: She will have KUB abdominal film with further instructions to follow  Addendum: KUB shows increased stool burden.  Recommend Dulcolax suppository x 1  Pt needs wellness exam and will be contacted for future appt.

## 2018-08-10 NOTE — Patient Instructions (Signed)
KUB abdominal film suggest constipation.  Recommend Dulcolax suppository x1.  Urine culture pending.  GI cocktail given.

## 2018-08-11 LAB — CBC WITH DIFFERENTIAL/PLATELET
Basophils Absolute: 29 cells/uL (ref 0–200)
Basophils Relative: 0.9 %
Eosinophils Absolute: 29 cells/uL (ref 15–500)
Eosinophils Relative: 0.9 %
HCT: 40.9 % (ref 35.0–45.0)
Hemoglobin: 13.9 g/dL (ref 11.7–15.5)
Lymphs Abs: 1392 cells/uL (ref 850–3900)
MCH: 30.2 pg (ref 27.0–33.0)
MCHC: 34 g/dL (ref 32.0–36.0)
MCV: 88.7 fL (ref 80.0–100.0)
MPV: 10.4 fL (ref 7.5–12.5)
Monocytes Relative: 10.5 %
Neutro Abs: 1414 cells/uL — ABNORMAL LOW (ref 1500–7800)
Neutrophils Relative %: 44.2 %
Platelets: 253 10*3/uL (ref 140–400)
RBC: 4.61 10*6/uL (ref 3.80–5.10)
RDW: 13 % (ref 11.0–15.0)
Total Lymphocyte: 43.5 %
WBC mixed population: 336 cells/uL (ref 200–950)
WBC: 3.2 10*3/uL — ABNORMAL LOW (ref 3.8–10.8)

## 2018-08-11 LAB — COMPLETE METABOLIC PANEL WITH GFR
AG Ratio: 1.7 (calc) (ref 1.0–2.5)
ALT: 22 U/L (ref 6–29)
AST: 26 U/L (ref 10–35)
Albumin: 4.1 g/dL (ref 3.6–5.1)
Alkaline phosphatase (APISO): 59 U/L (ref 33–130)
BUN: 13 mg/dL (ref 7–25)
CO2: 31 mmol/L (ref 20–32)
Calcium: 9.2 mg/dL (ref 8.6–10.4)
Chloride: 100 mmol/L (ref 98–110)
Creat: 0.78 mg/dL (ref 0.50–1.05)
GFR, Est African American: 96 mL/min/{1.73_m2} (ref >=60)
GFR, Est Non African American: 83 mL/min/{1.73_m2} (ref >=60)
Globulin: 2.4 g/dL (calc) (ref 1.9–3.7)
Glucose, Bld: 86 mg/dL (ref 65–99)
Potassium: 4 mmol/L (ref 3.5–5.3)
Sodium: 139 mmol/L (ref 135–146)
Total Bilirubin: 0.4 mg/dL (ref 0.2–1.2)
Total Protein: 6.5 g/dL (ref 6.1–8.1)

## 2018-08-11 LAB — URINE CULTURE
MICRO NUMBER:: 91067926
SPECIMEN QUALITY:: ADEQUATE

## 2018-09-05 ENCOUNTER — Telehealth: Payer: Self-pay | Admitting: Neurology

## 2018-09-05 MED ORDER — METHYLPHENIDATE HCL 10 MG PO TABS: ORAL_TABLET | ORAL | 0 refills | Status: DC

## 2018-09-05 MED ORDER — HYDROCODONE-ACETAMINOPHEN 7.5-325 MG PO TABS: ORAL_TABLET | ORAL | 0 refills | Status: DC

## 2018-09-05 NOTE — Telephone Encounter (Signed)
Pt request refill for HYDROcodone-acetaminophen (NORCO) 7.5-325 MG tablet and methylphenidate (RITALIN) 10 MG tablet °

## 2018-09-05 NOTE — Telephone Encounter (Signed)
Rx's up front [facility]/fim 

## 2018-09-05 NOTE — Telephone Encounter (Signed)
Rx's awaiting RAS sig/fim 

## 2018-10-04 ENCOUNTER — Telehealth: Payer: Self-pay | Admitting: Neurology

## 2018-10-04 MED ORDER — HYDROCODONE-ACETAMINOPHEN 7.5-325 MG PO TABS: ORAL_TABLET | ORAL | 0 refills | Status: DC

## 2018-10-04 MED ORDER — METHYLPHENIDATE HCL 10 MG PO TABS: ORAL_TABLET | ORAL | 0 refills | Status: DC

## 2018-10-04 NOTE — Addendum Note (Signed)
Addended by: Hillis Range on: 10/04/2018 03:55 PM   Modules accepted: Orders

## 2018-10-04 NOTE — Telephone Encounter (Signed)
I checked drug registry and she last refilled hydrocodone #180 on 09/09/18 and Ritalin #180 on 09/13/18. I post-dated rx's and printed. Waiting on MD signature.

## 2018-10-04 NOTE — Telephone Encounter (Signed)
Placed rx's up front for pick up.

## 2018-10-04 NOTE — Telephone Encounter (Signed)
Pt has called for a refill prescription on her HYDROcodone-acetaminophen (NORCO) 7.5-325 MG tablet and methylphenidate (RITALIN) 10 MG tablet

## 2018-10-31 ENCOUNTER — Other Ambulatory Visit: Payer: Self-pay | Admitting: Internal Medicine

## 2018-10-31 DIAGNOSIS — Z13220 Encounter for screening for lipoid disorders: Secondary | ICD-10-CM

## 2018-10-31 DIAGNOSIS — I480 Paroxysmal atrial fibrillation: Secondary | ICD-10-CM

## 2018-10-31 DIAGNOSIS — Z0000 Encounter for general adult medical examination without abnormal findings: Secondary | ICD-10-CM

## 2018-10-31 DIAGNOSIS — B0229 Other postherpetic nervous system involvement: Secondary | ICD-10-CM

## 2018-10-31 DIAGNOSIS — E559 Vitamin D deficiency, unspecified: Secondary | ICD-10-CM

## 2018-10-31 DIAGNOSIS — M1990 Unspecified osteoarthritis, unspecified site: Secondary | ICD-10-CM

## 2018-10-31 DIAGNOSIS — Z1329 Encounter for screening for other suspected endocrine disorder: Secondary | ICD-10-CM

## 2018-10-31 DIAGNOSIS — M545 Low back pain: Secondary | ICD-10-CM

## 2018-10-31 DIAGNOSIS — G35 Multiple sclerosis: Secondary | ICD-10-CM

## 2018-10-31 DIAGNOSIS — G8929 Other chronic pain: Secondary | ICD-10-CM

## 2018-10-31 DIAGNOSIS — R51 Headache: Secondary | ICD-10-CM

## 2018-11-05 ENCOUNTER — Other Ambulatory Visit: Payer: 59 | Admitting: Internal Medicine

## 2018-11-05 DIAGNOSIS — Z13220 Encounter for screening for lipoid disorders: Secondary | ICD-10-CM

## 2018-11-05 DIAGNOSIS — G8929 Other chronic pain: Secondary | ICD-10-CM

## 2018-11-05 DIAGNOSIS — I480 Paroxysmal atrial fibrillation: Secondary | ICD-10-CM

## 2018-11-05 DIAGNOSIS — B0229 Other postherpetic nervous system involvement: Secondary | ICD-10-CM

## 2018-11-05 DIAGNOSIS — M545 Low back pain, unspecified: Secondary | ICD-10-CM

## 2018-11-05 DIAGNOSIS — M1990 Unspecified osteoarthritis, unspecified site: Secondary | ICD-10-CM

## 2018-11-05 DIAGNOSIS — Z1329 Encounter for screening for other suspected endocrine disorder: Secondary | ICD-10-CM

## 2018-11-05 DIAGNOSIS — E559 Vitamin D deficiency, unspecified: Secondary | ICD-10-CM

## 2018-11-05 DIAGNOSIS — Z0000 Encounter for general adult medical examination without abnormal findings: Secondary | ICD-10-CM

## 2018-11-05 DIAGNOSIS — G35 Multiple sclerosis: Secondary | ICD-10-CM

## 2018-11-05 DIAGNOSIS — R51 Headache: Secondary | ICD-10-CM

## 2018-11-06 ENCOUNTER — Telehealth: Payer: Self-pay | Admitting: Neurology

## 2018-11-06 LAB — CBC WITH DIFFERENTIAL/PLATELET
Basophils Absolute: 29 cells/uL (ref 0–200)
Basophils Relative: 0.9 %
Eosinophils Absolute: 19 cells/uL (ref 15–500)
Eosinophils Relative: 0.6 %
HCT: 41.3 % (ref 35.0–45.0)
Hemoglobin: 14.3 g/dL (ref 11.7–15.5)
Lymphs Abs: 1072 cells/uL (ref 850–3900)
MCH: 30.4 pg (ref 27.0–33.0)
MCHC: 34.6 g/dL (ref 32.0–36.0)
MCV: 87.9 fL (ref 80.0–100.0)
MPV: 10.5 fL (ref 7.5–12.5)
Monocytes Relative: 9.9 %
Neutro Abs: 1763 cells/uL (ref 1500–7800)
Neutrophils Relative %: 55.1 %
Platelets: 228 10*3/uL (ref 140–400)
RBC: 4.7 10*6/uL (ref 3.80–5.10)
RDW: 12.7 % (ref 11.0–15.0)
Total Lymphocyte: 33.5 %
WBC mixed population: 317 cells/uL (ref 200–950)
WBC: 3.2 10*3/uL — ABNORMAL LOW (ref 3.8–10.8)

## 2018-11-06 LAB — LIPID PANEL
Cholesterol: 202 mg/dL — ABNORMAL HIGH (ref <200)
HDL: 95 mg/dL (ref >50)
LDL Cholesterol (Calc): 97 mg/dL (calc)
Non-HDL Cholesterol (Calc): 107 mg/dL (calc) (ref <130)
Total CHOL/HDL Ratio: 2.1 (calc) (ref <5.0)
Triglycerides: 35 mg/dL (ref <150)

## 2018-11-06 LAB — COMPLETE METABOLIC PANEL WITH GFR
AG Ratio: 1.8 (calc) (ref 1.0–2.5)
ALT: 18 U/L (ref 6–29)
AST: 19 U/L (ref 10–35)
Albumin: 4.3 g/dL (ref 3.6–5.1)
Alkaline phosphatase (APISO): 71 U/L (ref 33–130)
BUN: 22 mg/dL (ref 7–25)
CO2: 30 mmol/L (ref 20–32)
Calcium: 9.5 mg/dL (ref 8.6–10.4)
Chloride: 103 mmol/L (ref 98–110)
Creat: 0.85 mg/dL (ref 0.50–1.05)
GFR, Est African American: 87 mL/min/{1.73_m2} (ref >=60)
GFR, Est Non African American: 75 mL/min/{1.73_m2} (ref >=60)
Globulin: 2.4 g/dL (calc) (ref 1.9–3.7)
Glucose, Bld: 100 mg/dL — ABNORMAL HIGH (ref 65–99)
Potassium: 4.2 mmol/L (ref 3.5–5.3)
Sodium: 140 mmol/L (ref 135–146)
Total Bilirubin: 0.6 mg/dL (ref 0.2–1.2)
Total Protein: 6.7 g/dL (ref 6.1–8.1)

## 2018-11-06 LAB — TSH: TSH: 2.61 mIU/L (ref 0.40–4.50)

## 2018-11-06 LAB — VITAMIN D 25 HYDROXY (VIT D DEFICIENCY, FRACTURES): Vit D, 25-Hydroxy: 16 ng/mL — ABNORMAL LOW (ref 30–100)

## 2018-11-06 MED ORDER — HYDROCODONE-ACETAMINOPHEN 7.5-325 MG PO TABS: ORAL_TABLET | ORAL | 0 refills | Status: DC

## 2018-11-06 MED ORDER — METHYLPHENIDATE HCL 10 MG PO TABS: ORAL_TABLET | ORAL | 0 refills | Status: DC

## 2018-11-06 NOTE — Addendum Note (Signed)
Addended by: Hillis Range on: 11/06/2018 02:57 PM   Modules accepted: Orders

## 2018-11-06 NOTE — Telephone Encounter (Signed)
Patient requesting refill of HYDROcodone-acetaminophen (NORCO) 7.5-325 MG tablet and methylphenidate (RITALIN) 10 MG tablet. ° ° °

## 2018-11-06 NOTE — Telephone Encounter (Signed)
Checked drug registry. Last refilled hydrocodone 10/10/18 #180 and Ritalin 10/14/18 #180. Not receiving from other MD's. Last seen 07/02/18 and next f/u 11/15/18. Rx's printed, waiting on MD signature

## 2018-11-06 NOTE — Telephone Encounter (Signed)
Placed printed rx's up front for pick up.

## 2018-11-09 ENCOUNTER — Encounter: Payer: 59 | Admitting: Internal Medicine

## 2018-11-12 ENCOUNTER — Encounter: Payer: Self-pay | Admitting: Internal Medicine

## 2018-11-12 ENCOUNTER — Ambulatory Visit: Payer: 59 | Admitting: Internal Medicine

## 2018-11-12 VITALS — BP 130/80 | HR 84 | Temp 98.5°F | Ht 65.0 in | Wt 145.0 lb

## 2018-11-12 DIAGNOSIS — R52 Pain, unspecified: Secondary | ICD-10-CM

## 2018-11-12 DIAGNOSIS — B349 Viral infection, unspecified: Secondary | ICD-10-CM | POA: Diagnosis not present

## 2018-11-12 DIAGNOSIS — R509 Fever, unspecified: Secondary | ICD-10-CM | POA: Diagnosis not present

## 2018-11-12 DIAGNOSIS — J9801 Acute bronchospasm: Secondary | ICD-10-CM

## 2018-11-12 DIAGNOSIS — G35 Multiple sclerosis: Secondary | ICD-10-CM

## 2018-11-12 LAB — POCT INFLUENZA A/B
Influenza A, POC: NEGATIVE
Influenza B, POC: NEGATIVE

## 2018-11-12 MED ORDER — HYDROCODONE-HOMATROPINE 5-1.5 MG/5ML PO SYRP: 5.0000 mL | ORAL_SOLUTION | Freq: Three times a day (TID) | ORAL | 0 refills | Status: DC | PRN

## 2018-11-12 MED ORDER — OSELTAMIVIR PHOSPHATE 75 MG PO CAPS: 75.0000 mg | ORAL_CAPSULE | Freq: Two times a day (BID) | ORAL | 0 refills | Status: DC

## 2018-11-12 MED ORDER — AZITHROMYCIN 250 MG PO TABS: ORAL_TABLET | ORAL | 0 refills | Status: DC

## 2018-11-12 NOTE — Progress Notes (Signed)
   Subjective:    Patient ID: [name], female    DOB: 1961/02/21, 57 y.o.   MRN: [id number]  HPI Her son had more orthopedic surgery last week at [facility].  She was there on Tuesday, December 3.  Over the weekend came down with fever chills myalgias cough and congestion.  Says temperature is been up to 102 degrees at home.  Does not usually take flu vaccines.  She has a history of multiple sclerosis.  She takes Tecfidera per Neurologist for multiple sclerosis.  No nausea or vomiting but feels" awful".  Husband has not had flu vaccine either.  He was seen today for physical exam.  He has a sinus infection.    Review of Systems see above-says she has had some wheezing     Objective:   Physical Exam Skin is pale warm and dry.  She appears thin.  Looks fatigued.  Pharynx is slightly injected.  TMs slightly full bilaterally.  Neck is supple without adenopathy.  Chest scattered inspiratory wheezes throughout both lung fields.  Rapid screen for influenza is negative.  A respiratory infection panel was sent.       Assessment & Plan:  Symptoms are consistent with flulike illness.  She was placed on Tamiflu 75 mg twice daily for 5 days.  For respiratory infection, given Zithromax Z-Pak take 2 tablets day 1 followed by 1 tablet days 2 through 5.  Has element of bronchospasm which is mild.  Was given Hycodan 1 teaspoon p.o. every 8 hours as needed cough.  Recommend to rest and drink plenty of fluids.  Tylenol if needed for fever.

## 2018-11-12 NOTE — Patient Instructions (Signed)
Rest and drink plenty of fluids.  Hycodan 1 teaspoon p.o. every 8 hours as needed cough.  Tylenol if needed for fever.  Zithromax Z-PAK take 2 p.o. day 1 followed by 1 p.o. days 2 through 5 for respiratory infection symptoms and congestion.  Respiratory infection panel pending.  Tamiflu 75 mg twice daily for 5 days.

## 2018-11-13 LAB — RESPIRATORY VIRUS PANEL

## 2018-11-15 ENCOUNTER — Encounter: Payer: Self-pay | Admitting: Neurology

## 2018-11-15 ENCOUNTER — Ambulatory Visit (INDEPENDENT_AMBULATORY_CARE_PROVIDER_SITE_OTHER): Payer: 59 | Admitting: Neurology

## 2018-11-15 ENCOUNTER — Other Ambulatory Visit: Payer: Self-pay

## 2018-11-15 VITALS — BP 137/91 | HR 72 | Resp 18 | Ht 65.0 in | Wt 145.0 lb

## 2018-11-15 DIAGNOSIS — N3941 Urge incontinence: Secondary | ICD-10-CM

## 2018-11-15 DIAGNOSIS — G35 Multiple sclerosis: Secondary | ICD-10-CM

## 2018-11-15 DIAGNOSIS — R269 Unspecified abnormalities of gait and mobility: Secondary | ICD-10-CM | POA: Diagnosis not present

## 2018-11-15 DIAGNOSIS — M545 Low back pain, unspecified: Secondary | ICD-10-CM

## 2018-11-15 DIAGNOSIS — G8929 Other chronic pain: Secondary | ICD-10-CM

## 2018-11-15 DIAGNOSIS — F988 Other specified behavioral and emotional disorders with onset usually occurring in childhood and adolescence: Secondary | ICD-10-CM

## 2018-11-15 DIAGNOSIS — R208 Other disturbances of skin sensation: Secondary | ICD-10-CM

## 2018-11-15 MED ORDER — METHYLPHENIDATE HCL 10 MG PO TABS: ORAL_TABLET | ORAL | 0 refills | Status: DC

## 2018-11-15 MED ORDER — DIMETHYL FUMARATE 240 MG PO CPDR: DELAYED_RELEASE_CAPSULE | ORAL | 12 refills | Status: DC

## 2018-11-15 MED ORDER — HYDROCODONE-ACETAMINOPHEN 7.5-325 MG PO TABS: ORAL_TABLET | ORAL | 0 refills | Status: DC

## 2018-11-15 NOTE — Addendum Note (Signed)
Addended by: Despina Arias A on: 11/15/2018 11:13 AM   Modules accepted: Orders

## 2018-11-15 NOTE — Progress Notes (Signed)
[facility]  PATIENT: [name] DOB: Apr 29, 1961 REASON FOR VISIT: Multiple sclerosis   HISTORICAL  CHIEF COMPLAINT:  Chief Complaint  Patient presents with  . Multiple Sclerosis    Rm. 12.  Sts. she continues to [employer] well.  Recent URI but is on Zithromax. Has been fever free for 24 hours.  Under more stress.  Her son was in a motorcycle accident in August--fx. both legs, has had 5 surgeries so far/fim    HISTORY OF PRESENT ILLNESS:  [name] is a 57 year old woman with multiple sclerosis diagnosed in 2008.    Update 59/11/2018: She is on Tecfidera and she tolerates it well.   No exacerbationShe has had an URI the past week and felt more fatigued.   She was placed on Tamiflu, Z-pak and prednisone.    She always feels weaker and more tired when she has an infection.   She has a torn meniscus on the right and gait is worse.   Strength and sensation are unchanged.   She needs to wear Poise Pads for her bladder due to leakage.     Ritalin helps her focus and attention and fatigue.  She is sleeping well most night.    Gabapentin helps the leg and torso pain at night and also helps her sleep.   Mood is doing ok -- worried about her 59 yo son in recent motorcycle accident requiring multiple orthopedic operations.    Recent labs show normal lymphocyte count, low vit D (16) and normal CMP and TSH.    Update 07/02/2018: She feels her MS is slightly worse, with m,ore gait and bladder issues.   She is on Tecfidera and she tolerates it well.     Her gait is mildly worse and she falls at last once a month but stumbles frequently.   Outside her house is not flat.   She has a right foot drop.  She also has right knee pain (has had surgery for torn meniscus).   She also has lower bak pain and in the past was told she  Had lumbar spinal stenosis.   Dr. Ethelene Hal has done [facility] in the past (2016 and 2017).   She has dysesthesias and pain in her thoracic spine.    She  never took the oxcarbazepine.    Her bladder is doing worse.   She now wears pads all the time.  She has incontinence almost daily.    She has no hesitancy and feels that she empties.     She is not on any medication.   She was once on a bladder medication but had side effects and stopped.    She has a lot of fatigue and walks worse when more tired.   Fatigue is worse with heat.  Focus and attention have improved on Ritalin.  It also helps her fatigue some.  Update 02/26/2018: She denies any new exacerbations since her last visit.  She is on Tecfidera.  She tolerates it well.  She may have had one exacerbation at the end of last year.  A little before her January 2019 visit, she was experiencing more right  sided weakness.  MRI of the cervical and thoracic spine was checked.  She has multiple T2 hyperintense lesions in the spinal cord the largest to the right C2-C3 and smaller ones at C3-C4, C4, C5-C6, C6-C7 and T1-T2 all the foci were present compared to the 2016 MRI.   The MRI of the thoracic spine  showed small foci adjacent to T2 and T5-6.  None of the spinal plaques appeared to be acute.      Her gait is back to baseline and she is walking without a cane.  She still gets intermittent episodes where she will feel more off balance.  She notes mild weakness and clumsiness on the right side right now.  She gets burning pain, helped by gabapentin.   Addition of Trileptal did not help further and she stopped.  She is on hydrocodone 7.5 up to 6 a day for her chronic back pain, dysesthesias and headaches.  She has not shown any drug-seeking behavior  Bladder function is variable with urgency and rare incontinence.  Vision is stable.  She continues to report fatigue and also has decreased focus and attention.  Both issues are helped by Ritalin.  She tolerates it well.   She has heat intolerance.    Mood is doing fairly well.   She was more down in December 2018/01-25-2019due to death of 2 people she knew.      She has LBP and ESI's have helped in the past ([place]).    Update 12/11/2017:  She feels weaker on the right side.     This started 2 weeks ago with dragging of the right foot, but is much worse the past 5-6 days.    Weakness is arm, trunk and leg and she leans to the right even with sitting.   She notes she is less able to carry items in her.    Of note, she was having more pain down the arm before the symptoms started.    She notes lower neck pain but no arm pain .    MRI cervical spine in 2016 showed Foci to the right at C3, to the left at C4, to the left at C5-C6 (only seen on axial images) and medial at C6-C7. There is also a focus in the thoracic spine at T2.     Trileptal increased her headaches and she stopped after one week.   She is also on hydrocodone and gabapentin for pain.    She notes some bladder frequency but is uncertain of this has changed any. No change in bowel.  She tolerates Tecfidera well.       Update 10/30/2017:    She is on Tecfidera and tolerates it well.   She has no recent exacerbation.   She is noting more of a right foot drop.   This seems associated with LBP the past few days.   She notes some weakness in that leg.     She notes having a lumbar spine MRI last year [employer]) and was told she has spinal stenosis.  I reviewed the MRI report which we had faxed to [place]. At L3-L4 she has mild spinal stenosis and also has facet and ligamentous hypertrophy at L4-L5 and L5S1 she has moderate sized disc bulges but does not have spinal stenosis at those levels.  She had an ESI by Dr. Ethelene Hal late 2017 and had benefit until recently.    The truncal dysesthesias bother her mostly at night.  She Is on gabapentin 600 mg po bid and it makes her sleepy if she goes to higher dose.    She notes no change in her bladder and wears pads since she has occasional incontinence.  No new visual problems.  She is sleeping ok on current med's.   She notes ritalin helps fatigue and  cognitive fog/focus.  ___________________________________ From 06/19/2017: Since the last visit, she has had bronchitis and feels weaker and more fatigued.   She is having trouble Ritalin 20 mg.    MS:   She was feeling much more fatigued earlier this year.   Vit D was low (13.2) 03/17/2017.   CBC showed low WBC and slightly low neutrophils and normal lymphocytes.   She is on Tecfidera and denies exacerbation and she tolerates it well.   Last lymphocyte count was 1.1 in  April.    She notes cognitive and physical fatigue are bad and today is a worse than typical day..          Gait/Strength/sensation:   She feels walking is worse and balance is poor.    The right leg drags if she walks more than a short distance.   She also has right greater than left leg clumsiness.  She uses a cane often and has no recent falls..    She has dysesthesias in her feet and milder in her hands.    Pain is burning and left > right up to the mid shin.    She gets a tight sensation and a poking sensation in the thoracic spine region.   Baclofen and tizanidine were not well tolerated.  PHN:   She had shingles at the sternum and down left arm in December 2016 after her knee surgery.   She now takes only 600 mg gabapentin at night as higher doses were not well tolerated.  She is also on hydrocodone up to 6/day.   Shingles occurred at time of left knee surgery  Bladder:   She has had some bladder dysfunction and has mild incontinence. She there is some hesitancy but perhaps also was not tolerated.    Vision:    She is doing about the same with occasional diplopia, worse when tired.   An early exacerbation was diplopia.    No h/o optic neuritis.   No color asymmetry.   Fatigue/sleep:   She reports fatigue is both physical and cognitive. Ritalin (brand name) has helped her fatigue more than anything else.. The fatigue is worse in the heat.    She has some attentional deficits helped by Ritalin.  Sleep is better on gabapentin at  night.   Xanax help sleep onset.   She has nocturia x 4 some night but usually falls back asleep  Mood/cognition:  She has a mild depression and mild anxiety.  She feels these issues are stable.  She had a severe depression around 2010 and was on an antidepressant but did not tolerate it well.  She also has had some anxiety helped by Xanax..   She notes a brain fog at times and  Ritalin has helped a lot and she tolerates it well.      She has some word finding problems and decreased attention.    MS History:  In 2008, she had an MRI of the brain performed which was consistent with multiple sclerosis and she was diagnosed with relapsing remitting MS. He was initially placed on Avonex for therapy. She had some exacerbations with weakness and clumsiness with her gait and she started to see Dr. Leotis Shames. He placed her on Tysabri she is on Tysabri between 2010 and 2013. 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      REVIEW OF SYSTEMS:  Constitutional: No fevers,  chills, sweats, or change in appetite.  She notes fatigue Eyes: No visual changes,eye pain.   She has intermittent diplopia Ear, nose and throat: No hearing loss, ear pain, nasal congestion, sore throat Cardiovascular: No chest pain, palpitations Respiratory:  No shortness of breath at rest or with exertion.   No wheezes GastrointestinaI: No nausea, vomiting, diarrhea, abdominal pain, fecal incontinence Genitourinary:  a sabove Musculoskeletal:  she has both neck pain and back pain. Also bilat knee pain Integumentary: No rash, pruritus, skin lesions.   She has Raynauds in her hands  Neurological: as above Psychiatric: No depression at this time.  Anxiety doing ok on med's Endocrine: No palpitations, diaphoresis, change in appetite, change in weigh or increased thirst Hematologic/Lymphatic:  No anemia, purpura, petechiae. Allergic/Immunologic: No  itchy/runny eyes, nasal congestion, recent allergic reactions, rashes  ALLERGIES: Allergies  Allergen Reactions  . Ibuprofen Other (See Comments)    CAUSES LYMPHEDEMA  . Nsaids Other (See Comments)    AVOIDS ANY SODIUM CONTAINING MED. -- CAUSES LYMPHEDEMA  . Other Swelling and Other (See Comments)    Laxatives- causes leg swelling    HOME MEDICATIONS: Outpatient Medications Prior to Visit  Medication Sig Dispense Refill  . ALPRAZolam (XANAX) 0.5 MG tablet TAKE 1 TABLET BY MOUTH 3 TIMES A DAY AS NEEDED FOR ANXIETY/SLEEP 90 tablet 0  . azithromycin (ZITHROMAX) 250 MG tablet 2 po day 1 followed by one po days 2-5 6 tablet 0  . calcium-vitamin D 250-100 MG-UNIT tablet Take 1 tablet by mouth 2 (two) times daily.    . cyanocobalamin (,VITAMIN B-12,) 1000 MCG/ML injection 1,032mcg IM twice per month 10 mL 3  . gabapentin (NEURONTIN) 600 MG tablet TAKE 1 TABLET (600 MG TOTAL) BY MOUTH 2 (TWO) TIMES DAILY. 180 tablet 3  . HYDROcodone-homatropine (HYCODAN) 5-1.5 MG/5ML syrup Take 5 mLs by mouth every 8 (eight) hours as needed for cough. 120 mL 0  . hyoscyamine (LEVSIN SL) 0.125 MG SL tablet Place 1 tablet (0.125 mg total) under the tongue every 4 (four) hours as needed. 270 tablet 3  . Magnesium 200 MG TABS Take 200 mg by mouth daily.    . metoprolol (LOPRESSOR) 50 MG tablet Take 0.5 tablets (25 mg total) by mouth 2 (two) times daily as needed (racing heartbeat). 30 tablet 0  . NON FORMULARY Take 1 tablet by mouth See admin instructions. MALIC ACID-TAKES 1 TAB IN EVENING AND 1 TAB AT BEDTIME    . Syringe/Needle, Disp, (SYRINGE 3CC/23GX1") 23G X 1" 3 ML MISC 3 Syringes by Does not apply route every 30 (thirty) days. 3 each 3  . HYDROcodone-acetaminophen (NORCO) 7.5-325 MG tablet TAKE 2 TABLETS BY MOUTH EVERY 8 HOURS AS NEEDED 180 tablet 0  . methylphenidate (RITALIN) 10 MG tablet BRAND NAME NECESSARY.    Take six po daily in split doses 180 tablet 0  . TECFIDERA 240 MG CPDR TAKE 1 CAPSULE (240MG )  BY MOUTH TWICE DAILY 180 capsule 3  . oseltamivir (TAMIFLU) 75 MG capsule Take 1 capsule (75 mg total) by mouth 2 (two) times daily. (Patient not taking: Reported on 11/15/2018) 10 capsule 0  . Oxcarbazepine (TRILEPTAL) 300 MG tablet One po qAM and two po qHS 90 tablet 11  . Vitamin D, Ergocalciferol, (DRISDOL) 50000 units CAPS capsule Take 1 capsule (50,000 Units total) by mouth every 7 (seven) days. 26 capsule 0   No facility-administered medications prior to visit.     PAST MEDICAL HISTORY: Past Medical History:  Diagnosis Date  . ADD (  attention deficit disorder)   . Anxiety   . Arthritis   . Bladder incontinence    wears a pad  . Bone spur    cervical bone spurs  . Chronic fatigue   . Dysrhythmia    hx A-FIB  . 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 Headache(784.0)   . History of concussion YOUNG ADULT--  NO RESIDUAL  . IBS (irritable bowel syndrome)   . Movement disorder   . MS (multiple sclerosis) (HCC)   . MVP (mitral valve prolapse) HX HEART RACING  YRS AGO   ASYMPTOMATIC SINCE THEN  . Vision abnormalities   . Weakness of right leg SECONDARY TO MS    PAST SURGICAL HISTORY: Past Surgical History:  Procedure Laterality Date  . CESAREAN SECTION  X3   BILATERAL TUBAL LIGATION W/ LAST ONE  . DESTRUCTION OF ANAL CONDYLOMA  07-07-2005  DR TOTH  . HERNIA REPAIR    . KNEE ARTHROSCOPY WITH LATERAL MENISECTOMY  10/26/2012   Procedure: KNEE ARTHROSCOPY WITH LATERAL MENISECTOMY;  Surgeon: Javier Docker, MD;  Location: [facility];  Service: Orthopedics;  Laterality: Left;  Left Knee Arthrsocopy with Partial Lateral Menisectomy  . LAPAROSCOPIC ASSISTED VAGINAL HYSTERECTOMY  01-15-2002  DR Gerlene Burdock HOLLAND/ DR TIM DAVIS   AND REPAIR VENTRAL HERNIA  . LASER ABLATION CONDOLAMATA N/A 04/26/2013   Procedure: LASER ABLATION CONDOLAMATA;  Surgeon: Romie Levee, MD;  Location: [facility];  Service: General;  Laterality: N/A;  . TONSILLECTOMY      . TOTAL KNEE ARTHROPLASTY Left 11/05/2015   Procedure: LEFT TOTAL KNEE ARTHROPLASTY;  Surgeon: Jene Every, MD;  Location: [facility];  Service: Orthopedics;  Laterality: Left;    FAMILY HISTORY: Family History  Problem Relation Age of Onset  . Diabetes Mother   . Hypertension Mother   . Stroke Mother   . Bowel Disease Brother   . Diabetes Brother   . Healthy Brother   . Emphysema Father   . Heart attack Father     SOCIAL HISTORY:  Social History   Socioeconomic History  . Marital status: Married    Spouse name: Not on file  . Number of children: 3  . Years of education: Not on file  . Highest education level: Not on file  Occupational History  . Not on file  Social Needs  . Financial resource strain: Not on file  . Food insecurity:    Worry: Not on file    Inability: Not on file  . Transportation needs:    Medical: Not on file    Non-medical: Not on file  Tobacco Use  . Smoking status: Never Smoker  . Smokeless tobacco: Never Used  Substance and Sexual Activity  . Alcohol use: No  . Drug use: No  . Sexual activity: Not on file  Lifestyle  . Physical activity:    Days per week: Not on file    Minutes per session: Not on file  . Stress: Not on file  Relationships  . Social connections:    Talks on phone: Not on file    Gets together: Not on file    Attends religious service: Not on file    Active member of club or organization: Not on file    Attends meetings of clubs or organizations: Not on file    Relationship status: Not on file  . Intimate partner violence:    Fear of current or ex partner: Not on file  Emotionally abused: Not on file    Physically abused: Not on file    Forced sexual activity: Not on file  Other Topics Concern  . Not on file  Social History Narrative  . Not on file     PHYSICAL EXAM  Vitals:   11/15/18 1010  BP: (!) 137/91  Pulse: 72  Resp: 18  Weight: 145 lb (65.8 kg)  Height: 5\' 5"  (1.651 m)    Body mass index is  24.13 kg/m.   General: The patient is well-developed and well-nourished and in no acute distress   Neurologic Exam  Mental status: The patient is alert and oriented x 3 at the time of the examination. The patient has apparent normal recent and remote memory, with an apparently normal attention span and concentration ability.   Speech is normal.  Cranial nerves: The extraocular muscles are intact.  Facial strength and sensation is normal.  Trapezius strength is normal.  The tongue is midline, and the patient has symmetric elevation of the soft palate.   Motor:  Muscle bulk is normal. Tone is mildly increased in her legs.. Strength is 5/5  Sensory: Intact touch and vibration.  Coordination: Cerebellar testing shows mild right reduced heel to shin.  Finger to nose was normal today  Gait and station: Station is normal and gait is mildly wide.  The tandem gait is moderately wide.  The Romberg is mildly wide.  Reflexes: Deep tendon reflexes are symmetric and brisk bilaterally with crossed abductors at the knees. No ankle clonus.Marland Kitchen        DIAGNOSTIC DATA (LABS, IMAGING, TESTING) - I reviewed patient records, labs, notes, testing and imaging myself where available.  Lab Results  Component Value Date   WBC 3.2 (L) 11/05/2018   HGB 14.3 11/05/2018   HCT 41.3 11/05/2018   MCV 87.9 11/05/2018   PLT 228 11/05/2018      Component Value Date/Time   NA 140 11/05/2018 1005   NA 142 03/17/2017 0937   K 4.2 11/05/2018 1005   CL 103 11/05/2018 1005   CO2 30 11/05/2018 1005   GLUCOSE 100 (H) 11/05/2018 1005   BUN 22 11/05/2018 1005   BUN 19 03/17/2017 0937   CREATININE 0.85 11/05/2018 1005   CALCIUM 9.5 11/05/2018 1005   PROT 6.7 11/05/2018 1005   PROT 7.3 03/17/2017 0937   ALBUMIN 4.7 08/05/2018 2212   ALBUMIN 4.8 03/17/2017 0937   AST 19 11/05/2018 1005   ALT 18 11/05/2018 1005   ALKPHOS 73 08/05/2018 2212   BILITOT 0.6 11/05/2018 1005   BILITOT 0.6 03/17/2017 0937   GFRNONAA 75  11/05/2018 1005   GFRAA 87 11/05/2018 1005   Lab Results  Component Value Date   CHOL 202 (H) 11/05/2018   HDL 95 11/05/2018   LDLCALC 97 11/05/2018   TRIG 35 11/05/2018   CHOLHDL 2.1 11/05/2018   Lab Results  Component Value Date   HGBA1C  03/10/2010    5.5 (NOTE) The ADA recommends the following therapeutic goal for glycemic control related to Hgb A1c measurement: Goal of therapy: <6.5 Hgb A1c  Reference: American Diabetes Association: Clinical Practice Recommendations 2010, Diabetes Care, 2010, 33: (Suppl  1).      ASSESSMENT AND PLAN  Multiple sclerosis (HCC)  Abnormality of gait  Attention deficit disorder, unspecified hyperactivity presence  Chronic midline low back pain without sciatica  Urge incontinence  Dysesthesia   1.  She will continue Tecfidera.  Recent bloodwork is fine. 2.   Continue Ritalin  and hydrocodone.  Advised not to combine Norco with hycodan as already on a fairly high dose of the Norco.     continue Gabapentin.     3.  Stay active and exercise as needed. 4.  Vit D per Dr. Lenord Fellers 5    rtc 4 months, sooner if problems.    Elaysia Devargas A. Epimenio Foot, MD, PhD 11/15/2018, 10:44 AM Certified in Neurology, Clinical Neurophysiology, Sleep Medicine, Pain Medicine and Neuroimaging  [facility] [address] [place], [place] [address] [phone] ]

## 2018-12-01 ENCOUNTER — Other Ambulatory Visit: Payer: Self-pay | Admitting: Neurology

## 2019-01-03 ENCOUNTER — Other Ambulatory Visit: Payer: Self-pay | Admitting: Neurology

## 2019-01-09 ENCOUNTER — Other Ambulatory Visit: Payer: Self-pay | Admitting: *Deleted

## 2019-01-09 ENCOUNTER — Telehealth: Payer: Self-pay | Admitting: Neurology

## 2019-01-09 MED ORDER — HYDROCODONE-ACETAMINOPHEN 7.5-325 MG PO TABS: ORAL_TABLET | ORAL | 0 refills | Status: DC

## 2019-01-09 MED ORDER — METHYLPHENIDATE HCL 10 MG PO TABS: ORAL_TABLET | ORAL | 0 refills | Status: DC

## 2019-01-09 NOTE — Telephone Encounter (Signed)
Sent request to Dr. Epimenio Foot to refill rx's and send electronically to pharmacy

## 2019-01-09 NOTE — Telephone Encounter (Signed)
Patient called and requested a refill on the rx Rital and the rx Hydrocodone.

## 2019-01-14 ENCOUNTER — Telehealth: Payer: Self-pay | Admitting: Neurology

## 2019-01-14 NOTE — Telephone Encounter (Signed)
I attempted PA on covermymeds. No PA needed, on patient's formulary list.   I called and spoke with Lynden Ang at her pharmacy. Pt filled rx 01/11/19 #180, no PA needed. She states it looks like she is needing to meet her deductible via insurance. Usually pays around 50.00. She paid 74.59 for this prescription.  If she has further questions about this, she should contact her insurance.

## 2019-01-14 NOTE — Telephone Encounter (Signed)
I called pt and relayed below information. She will call insurance back to find out if she has a deductible to meet. They were the ones who told her PA needed. Nothing further needed at this time from [place].

## 2019-01-14 NOTE — Telephone Encounter (Signed)
Pt said methylphenidate (RITALIN) 10 MG tablet needs PA for name brand.

## 2019-02-02 ENCOUNTER — Other Ambulatory Visit: Payer: Self-pay | Admitting: Neurology

## 2019-02-06 ENCOUNTER — Other Ambulatory Visit: Payer: Self-pay | Admitting: Neurology

## 2019-02-07 ENCOUNTER — Other Ambulatory Visit: Payer: Self-pay | Admitting: Neurology

## 2019-02-07 MED ORDER — HYDROCODONE-ACETAMINOPHEN 7.5-325 MG PO TABS: ORAL_TABLET | ORAL | 0 refills | Status: DC

## 2019-02-07 MED ORDER — METHYLPHENIDATE HCL 10 MG PO TABS: ORAL_TABLET | ORAL | 0 refills | Status: DC

## 2019-02-07 NOTE — Telephone Encounter (Signed)
Patient requesting refill of Ritalin 10 mg and hydrocodone. Best call back is [phone]

## 2019-02-28 ENCOUNTER — Telehealth: Payer: Self-pay | Admitting: Internal Medicine

## 2019-02-28 ENCOUNTER — Ambulatory Visit (INDEPENDENT_AMBULATORY_CARE_PROVIDER_SITE_OTHER): Payer: 59 | Admitting: Internal Medicine

## 2019-02-28 ENCOUNTER — Other Ambulatory Visit: Payer: Self-pay

## 2019-02-28 ENCOUNTER — Encounter: Payer: Self-pay | Admitting: Internal Medicine

## 2019-02-28 DIAGNOSIS — J22 Unspecified acute lower respiratory infection: Secondary | ICD-10-CM

## 2019-02-28 DIAGNOSIS — R52 Pain, unspecified: Secondary | ICD-10-CM

## 2019-02-28 DIAGNOSIS — G35 Multiple sclerosis: Secondary | ICD-10-CM | POA: Diagnosis not present

## 2019-02-28 MED ORDER — BENZONATATE 100 MG PO CAPS: 100.0000 mg | ORAL_CAPSULE | Freq: Three times a day (TID) | ORAL | 0 refills | Status: DC | PRN

## 2019-02-28 NOTE — Telephone Encounter (Signed)
[name] [phone]  Judith called to say she is having same symptoms like she had in December, Croup like cough, fever last night of 101.4, chest congestion , headache, drainage in back of throat, pressure in upper chest area, sore throat from coughing, occasional drip, seems to be more bronchial.  No travel, No contact, last time out of house was 02/15/2019. She is also wandering if her Vit D levels are down.

## 2019-02-28 NOTE — Telephone Encounter (Signed)
Schedule virtual OV

## 2019-02-28 NOTE — Progress Notes (Signed)
   Subjective:    Patient ID: [name], female    DOB: [birth year], 58 y.o.   MRN: [id number]  HPI 58 year old Female with multiple sclerosis treated with Tecfidera by Dr. Epimenio Foot.  Patient had lower respiratory infection November 12, 2018.  Had flulike illness although rapid flu test was negative.  Respiratory infection panel was sent and was negative.  She was treated with Zithromax and Hycodan and improved.  She had mild bronchospasm at the time.  She called today and said she has come down with another respiratory illness that has been present for several days.  Has had fever, cough without sputum production, mild wheezing, malaise and fatigue.  Says she has had a lot of situational stress.  She did not elaborate when asked about this.  No vomiting or diarrhea.  She is on chronic pain medication per pain management for chronic musculoskeletal pain/fibromyalgia type symptoms.  Was seen today by virtual office visit.    Review of Systems see above     Objective:   Physical Exam No vital signs were taken.  She looks fatigued.  She is slightly pale.  No acute respiratory distress and no audible wheezing.       Assessment & Plan:  Acute lower respiratory infection  Multiple sclerosis  Fibromyalgia syndrome  Situational stress  Plan: We are calling her in another Zithromax Z-PAK 2 p.o. day 1 followed by 1 p.o. days 2 through 5.  We will prescribe Tessalon Perles 1 p.o. 3 times daily as needed for cough.  Rest and drink plenty of fluids and call if symptoms are not improving.  Stay at home.  Have explained that Coronavirus testing is currently not recommended at this point in time.

## 2019-02-28 NOTE — Patient Instructions (Addendum)
Zithromax Z-PAK take 2 p.o. day 1 followed by 1 p.o. days 2 through 5.  Take Tessalon Perles up to 3 times daily as needed for cough.  Rest and drink plenty of fluids.  Tylenol for fever.

## 2019-03-01 ENCOUNTER — Ambulatory Visit: Payer: 59 | Admitting: Internal Medicine

## 2019-03-01 ENCOUNTER — Telehealth: Payer: Self-pay | Admitting: Neurology

## 2019-03-01 MED ORDER — BENZONATATE 200 MG PO CAPS: 200.0000 mg | ORAL_CAPSULE | Freq: Three times a day (TID) | ORAL | 0 refills | Status: DC | PRN

## 2019-03-01 NOTE — Addendum Note (Signed)
Addended by: Gregery Na on: 03/01/2019 05:02 PM   Modules accepted: Orders

## 2019-03-01 NOTE — Telephone Encounter (Signed)
error 

## 2019-03-06 ENCOUNTER — Other Ambulatory Visit: Payer: Self-pay | Admitting: Neurology

## 2019-03-06 MED ORDER — METHYLPHENIDATE HCL 10 MG PO TABS: ORAL_TABLET | ORAL | 0 refills | Status: DC

## 2019-03-06 MED ORDER — HYDROCODONE-ACETAMINOPHEN 7.5-325 MG PO TABS: ORAL_TABLET | ORAL | 0 refills | Status: DC

## 2019-03-06 NOTE — Telephone Encounter (Signed)
Pt request refill for methylphenidate (RITALIN) 10 MG tablet and HYDROcodone-acetaminophen (NORCO) 7.5-325 MG tablet sent to [facility]  Pt states she has a bad cough for the past week. She has contacted PCP and was prescribed zpack and cough capsules. She is getting better. PCP said it was not COVID-19. She has the same thing in December. FYI

## 2019-03-12 ENCOUNTER — Telehealth: Payer: Self-pay | Admitting: Internal Medicine

## 2019-03-12 NOTE — Telephone Encounter (Signed)
Scheduled

## 2019-03-12 NOTE — Telephone Encounter (Signed)
[name] [phone]  [name] called to say she is feeling a lot better, but she is very weak, having trouble walking, and getting out of bed. She feels like she might need a steroid injection.

## 2019-03-12 NOTE — Telephone Encounter (Signed)
Book appt for tomorrow 

## 2019-03-13 ENCOUNTER — Encounter: Payer: Self-pay | Admitting: Internal Medicine

## 2019-03-13 ENCOUNTER — Other Ambulatory Visit: Payer: Self-pay

## 2019-03-13 ENCOUNTER — Ambulatory Visit (INDEPENDENT_AMBULATORY_CARE_PROVIDER_SITE_OTHER): Payer: 59 | Admitting: Internal Medicine

## 2019-03-13 VITALS — BP 130/80 | HR 60 | Temp 98.2°F

## 2019-03-13 DIAGNOSIS — R5383 Other fatigue: Secondary | ICD-10-CM | POA: Diagnosis not present

## 2019-03-13 DIAGNOSIS — G35 Multiple sclerosis: Secondary | ICD-10-CM

## 2019-03-13 MED ORDER — METHYLPREDNISOLONE ACETATE 80 MG/ML IJ SUSP
80.0000 mg | Freq: Once | INTRAMUSCULAR | Status: AC
  Administered 2019-03-13: 15:00:00 80 mg via INTRAMUSCULAR

## 2019-03-13 NOTE — Progress Notes (Signed)
   Subjective:    Patient ID: [name], female    DOB: 1961-09-04, 58 y.o.   MRN: [id number]  HPI Patient was seen by virtual visit March 26 diagnosed  with a lower respiratory infection.  She has multiple sclerosis.  Her neurologist is Dr. Epimenio Foot. She was treated with Zithromax and Tessalon Perles.  Denies being around anyone that is ill.  Her grandchild is been staying with her but he is not ill.  Says that respiratory infection symptoms have improved but fatigue is not improved.From time to time we have given her Depomedrol for fatigue and it seems to help. She would like an injection today.  We talked about steroids for decreasing her immune response, the converse is that it seems to help her MS symptoms.  Other members of the family at home  are her son who is recovering from a severe motorcycle accident in addition to her husband and grandchild.  Review of Systems she denies cough or flulike symptoms.  She looks a bit fatigued.     Objective:   Physical Exam Temperature 98.2 degrees orally blood pressure 130/80 and regular  Skin he is pale warm and dry.  Nodes none.  Chest is clear to auscultation without rales or wheezing.       Assessment & Plan:  Persistent fatigue with multiple sclerosis after treatment for lower respiratory infection with Zithromax and Tessalon Perles  Plan: Depo-Medrol 80 mg IM.  She will call if not improving.

## 2019-03-13 NOTE — Patient Instructions (Signed)
Depo-Medrol 80 mg IM given in office today.  Call if not improving over the next few days

## 2019-03-21 ENCOUNTER — Ambulatory Visit: Payer: 59 | Admitting: Neurology

## 2019-03-21 ENCOUNTER — Telehealth: Payer: Self-pay | Admitting: *Deleted

## 2019-03-21 NOTE — Telephone Encounter (Signed)
I called pt, updated her medication list, pharmacy and allergy list on file for her VV on 03/26/19 with Dr. Epimenio Foot.

## 2019-03-26 ENCOUNTER — Encounter: Payer: Self-pay | Admitting: Neurology

## 2019-03-26 ENCOUNTER — Other Ambulatory Visit: Payer: Self-pay

## 2019-03-26 ENCOUNTER — Ambulatory Visit (INDEPENDENT_AMBULATORY_CARE_PROVIDER_SITE_OTHER): Payer: 59 | Admitting: Neurology

## 2019-03-26 DIAGNOSIS — R269 Unspecified abnormalities of gait and mobility: Secondary | ICD-10-CM | POA: Diagnosis not present

## 2019-03-26 DIAGNOSIS — N3941 Urge incontinence: Secondary | ICD-10-CM | POA: Diagnosis not present

## 2019-03-26 DIAGNOSIS — R208 Other disturbances of skin sensation: Secondary | ICD-10-CM

## 2019-03-26 DIAGNOSIS — G35 Multiple sclerosis: Secondary | ICD-10-CM | POA: Diagnosis not present

## 2019-03-26 MED ORDER — VITAMIN D (ERGOCALCIFEROL) 1.25 MG (50000 UNIT) PO CAPS: 50000.0000 [IU] | ORAL_CAPSULE | ORAL | 3 refills | Status: DC

## 2019-03-26 MED ORDER — METHYLPHENIDATE HCL 10 MG PO TABS: ORAL_TABLET | ORAL | 0 refills | Status: DC

## 2019-03-26 MED ORDER — HYDROCODONE-ACETAMINOPHEN 7.5-325 MG PO TABS: ORAL_TABLET | ORAL | 0 refills | Status: DC

## 2019-03-26 NOTE — Progress Notes (Signed)
[facility]  PATIENT: [name] DOB: Jul 18, 1961 REASON FOR VISIT: Multiple sclerosis   HISTORICAL  CHIEF COMPLAINT:  Chief Complaint  Patient presents with   Multiple Sclerosis    HISTORY OF PRESENT ILLNESS:  [name] is a 58 y.o. woman with multiple sclerosis diagnosed in 2008.    Update 03/26/2019: Virtual Visit via Video Note I connected with [name]  on 03/26/19 at  2:30 PM EDT by a video enabled telemedicine application and verified that I am speaking with the correct person.  I discussed the limitations of evaluation and management by telemedicine and the availability of in person appointments. The patient expressed understanding and agreed to proceed.  History of Present Illness: She feels her MS is mostly stable though she felt worse for a few weeks.   She reported feeling sick in March with an URI and a fever.   She was placed on a Z-pak and URi symptoms improved.  She feels her gait is a little worse but she notes her recent infection associated with more fatigue and also she has knee pain.     She has some numbness and tingling in her legs.   She has some urge incontinence and uses Poise pads.   Vision is stable but she noted diplopia with   [name] has been a problem for a while but is always wore for a couple weeks after any infection.   Ritalin has helped fatigue and also helps her focus/attention.     Vitamin D is low and we discussed a higher supplement.     Observations/Objective:She is a well-developed well-nourished woman in no acute distress.  The head is normocephalic and atraumatic.  Sclera are anicteric.  Visible skin appears normal.  The neck has a good range of motion.  Pharynx and tongue have normal appearance.  She is alert and fully oriented with fluent speech and good attention, knowledge and memory.  Extraocular muscles are intact.  Facial strength is normal.  Palatal elevation and tongue protrusion are midline.  She  appears to have normal strength in the arms.  Rapid alternating movements and finger-nose-finger are performed well.  Assessment and Plan: Multiple sclerosis (HCC)  Abnormality of gait  Urge incontinence  Dysesthesia  1.   Continue Tecfidera as her MS disease modifying therapy.  Later this year we will consider getting another MRI of the brain to determine if there is any subclinical progression.  If present, consider a different DMT. 2.   Continue medications for dysesthetic pain 3.   Vitamin D is low again.  I will have her supplemented with high-dose weekly vitamin D for 1 year and convert over to 5000 units OTC. 4.   She will return to see me in 4 months but sooner if there are new or worsening neurologic symptoms.  Follow Up Instructions: I discussed the assessment and treatment plan with the patient. The patient was provided an opportunity to ask questions and all were answered. The patient agreed with the plan and demonstrated an understanding of the instructions.    The patient was advised to call back or seek an in-person evaluation if the symptoms worsen or if the condition fails to improve as anticipated.  I provided 28 minutes of non-face-to-face time during this encounter.  ____________________ From prior visits Update 11/15/2018: She is on Tecfidera and she tolerates it well.   No exacerbationShe has had an URI the past week and felt more fatigued.   She was  placed on Tamiflu, Z-pak and prednisone.    She always feels weaker and more tired when she has an infection.   She has a torn meniscus on the right and gait is worse.   Strength and sensation are unchanged.   She needs to wear Poise Pads for her bladder due to leakage.     Ritalin helps her focus and attention and fatigue.  She is sleeping well most night.    Gabapentin helps the leg and torso pain at night and also helps her sleep.   Mood is doing ok -- worried about her 26 yo son in recent motorcycle accident requiring  multiple orthopedic operations.    Recent labs show normal lymphocyte count, low vit D (16) and normal CMP and TSH.    Update 07/02/2018: She feels her MS is slightly worse, with m,ore gait and bladder issues.   She is on Tecfidera and she tolerates it well.     Her gait is mildly worse and she falls at last once a month but stumbles frequently.   Outside her house is not flat.   She has a right foot drop.  She also has right knee pain (has had surgery for torn meniscus).   She also has lower bak pain and in the past was told she  Had lumbar spinal stenosis.   Dr. Ethelene Hal has done [facility] in the past (2016 and 2017).   She has dysesthesias and pain in her thoracic spine.    She never took the oxcarbazepine.    Her bladder is doing worse.   She now wears pads all the time.  She has incontinence almost daily.    She has no hesitancy and feels that she empties.     She is not on any medication.   She was once on a bladder medication but had side effects and stopped.    She has a lot of fatigue and walks worse when more tired.   Fatigue is worse with heat.  Focus and attention have improved on Ritalin.  It also helps her fatigue some.  Update 02/26/2018: She denies any new exacerbations since her last visit.  She is on Tecfidera.  She tolerates it well.  She may have had one exacerbation at the end of last year.  A little before her January 2019 visit, she was experiencing more right  sided weakness.  MRI of the cervical and thoracic spine was checked.  She has multiple T2 hyperintense lesions in the spinal cord the largest to the right C2-C3 and smaller ones at C3-C4, C4, C5-C6, C6-C7 and T1-T2 all the foci were present compared to the 2016 MRI.   The MRI of the thoracic spine showed small foci adjacent to T2 and T5-6.  None of the spinal plaques appeared to be acute.      Her gait is back to baseline and she is walking without a cane.  She still gets intermittent episodes where she will feel more off balance.   She notes mild weakness and clumsiness on the right side right now.  She gets burning pain, helped by gabapentin.   Addition of Trileptal did not help further and she stopped.  She is on hydrocodone 7.5 up to 6 a day for her chronic back pain, dysesthesias and headaches.  She has not shown any drug-seeking behavior  Bladder function is variable with urgency and rare incontinence.  Vision is stable.  She continues to report fatigue and also has decreased focus  and attention.  Both issues are helped by Ritalin.  She tolerates it well.   She has heat intolerance.    Mood is doing fairly well.   She was more down in December 2018/January 13, 2019due to death of 2 people she knew.     She has LBP and ESI's have helped in the past ([place]).    Update 12/11/2017:  She feels weaker on the right side.     This started 2 weeks ago with dragging of the right foot, but is much worse the past 5-6 days.    Weakness is arm, trunk and leg and she leans to the right even with sitting.   She notes she is less able to carry items in her.    Of note, she was having more pain down the arm before the symptoms started.    She notes lower neck pain but no arm pain .    MRI cervical spine in 2016 showed Foci to the right at C3, to the left at C4, to the left at C5-C6 (only seen on axial images) and medial at C6-C7. There is also a focus in the thoracic spine at T2.     Trileptal increased her headaches and she stopped after one week.   She is also on hydrocodone and gabapentin for pain.    She notes some bladder frequency but is uncertain of this has changed any. No change in bowel.  She tolerates Tecfidera well.       Update 10/30/2017:    She is on Tecfidera and tolerates it well.   She has no recent exacerbation.   She is noting more of a right foot drop.   This seems associated with LBP the past few days.   She notes some weakness in that leg.     She notes having a lumbar spine MRI last year [employer]) and was told  she has spinal stenosis.  I reviewed the MRI report which we had faxed to [place]. At L3-L4 she has mild spinal stenosis and also has facet and ligamentous hypertrophy at L4-L5 and L5S1 she has moderate sized disc bulges but does not have spinal stenosis at those levels.  She had an ESI by Dr. Ethelene Hal late 2017 and had benefit until recently.    The truncal dysesthesias bother her mostly at night.  She Is on gabapentin 600 mg po bid and it makes her sleepy if she goes to higher dose.    She notes no change in her bladder and wears pads since she has occasional incontinence.  No new visual problems.  She is sleeping ok on current med's.   She notes ritalin helps fatigue and cognitive fog/focus.    ___________________________________ From 06/19/2017: Since the last visit, she has had bronchitis and feels weaker and more fatigued.   She is having trouble Ritalin 20 mg.    MS:   She was feeling much more fatigued earlier this year.   Vit D was low (13.2) 03/17/2017.   CBC showed low WBC and slightly low neutrophils and normal lymphocytes.   She is on Tecfidera and denies exacerbation and she tolerates it well.   Last lymphocyte count was 1.1 in  April.    She notes cognitive and physical fatigue are bad and today is a worse than typical day..          Gait/Strength/sensation:   She feels walking is worse and balance is poor.    The right leg  drags if she walks more than a short distance.   She also has right greater than left leg clumsiness.  She uses a cane often and has no recent falls..    She has dysesthesias in her feet and milder in her hands.    Pain is burning and left > right up to the mid shin.    She gets a tight sensation and a poking sensation in the thoracic spine region.   Baclofen and tizanidine were not well tolerated.  PHN:   She had shingles at the sternum and down left arm in December 2016 after her knee surgery.   She now takes only 600 mg gabapentin at night as higher doses were not well  tolerated.  She is also on hydrocodone up to 6/day.   Shingles occurred at time of left knee surgery  Bladder:   She has had some bladder dysfunction and has mild incontinence. She there is some hesitancy but perhaps also was not tolerated.    Vision:    She is doing about the same with occasional diplopia, worse when tired.   An early exacerbation was diplopia.    No h/o optic neuritis.   No color asymmetry.   Fatigue/sleep:   She reports fatigue is both physical and cognitive. Ritalin (brand name) has helped her fatigue more than anything else.. The fatigue is worse in the heat.    She has some attentional deficits helped by Ritalin.  Sleep is better on gabapentin at night.   Xanax help sleep onset.   She has nocturia x 4 some night but usually falls back asleep  Mood/cognition:  She has a mild depression and mild anxiety.  She feels these issues are stable.  She had a severe depression around 2010 and was on an antidepressant but did not tolerate it well.  She also has had some anxiety helped by Xanax..   She notes a brain fog at times and  Ritalin has helped a lot and she tolerates it well.      She has some word finding problems and decreased attention.    MS History:  In 2008, she had an MRI of the brain performed which was consistent with multiple sclerosis and she was diagnosed with relapsing remitting MS. He was initially placed on Avonex for therapy. She had some exacerbations with weakness and clumsiness with her gait and she started to see Dr. Leotis ShamesJeffery. He placed her on Tysabri she is on Tysabri between 2010 and 2013. 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      REVIEW OF SYSTEMS:  Constitutional: No fevers, chills, sweats, or change in appetite.  She notes fatigue Eyes: No visual changes,eye pain.   She has intermittent diplopia Ear, nose and throat: No hearing loss, ear pain, nasal  congestion, sore throat Cardiovascular: No chest pain, palpitations Respiratory:  No shortness of breath at rest or with exertion.   No wheezes GastrointestinaI: No nausea, vomiting, diarrhea, abdominal pain, fecal incontinence Genitourinary:  a sabove Musculoskeletal:  she has both neck pain and back pain. Also bilat knee pain Integumentary: No rash, pruritus, skin lesions.   She has Raynauds in her hands  Neurological: as above Psychiatric: No depression at this time.  Anxiety doing ok on med's Endocrine: No palpitations, diaphoresis, change in appetite, change in weigh or increased thirst Hematologic/Lymphatic:  No anemia, purpura, petechiae. Allergic/Immunologic:  No itchy/runny eyes, nasal congestion, recent allergic reactions, rashes  ALLERGIES: Allergies  Allergen Reactions   Ibuprofen Other (See Comments)    CAUSES LYMPHEDEMA   Nsaids Other (See Comments)    AVOIDS ANY SODIUM CONTAINING MED. -- CAUSES LYMPHEDEMA   Other Swelling and Other (See Comments)    Laxatives- causes leg swelling artificial sweeteners    HOME MEDICATIONS: Outpatient Medications Prior to Visit  Medication Sig Dispense Refill   ALPRAZolam (XANAX) 0.5 MG tablet TAKE 1 TABLET BY MOUTH 3 TIMES A DAY AS NEEDED FOR ANXIETY/SLEEP (Patient not taking: Reported on 03/21/2019) 90 tablet 0   calcium-vitamin D 250-100 MG-UNIT tablet Take 1 tablet by mouth 2 (two) times daily.     cyanocobalamin (,VITAMIN B-12,) 1000 MCG/ML injection USE 1 IM TWICE PER MONTH 2 mL 19   Dimethyl Fumarate (TECFIDERA) 240 MG CPDR One po bid. 60 capsule 12   gabapentin (NEURONTIN) 600 MG tablet TAKE 1 TABLET (600 MG TOTAL) BY MOUTH 2 (TWO) TIMES DAILY. 180 tablet 3   hyoscyamine (LEVSIN SL) 0.125 MG SL tablet Place 1 tablet (0.125 mg total) under the tongue every 4 (four) hours as needed. 270 tablet 3   Magnesium 200 MG TABS Take 200 mg by mouth daily. +malic acid     metoprolol (LOPRESSOR) 50 MG tablet Take 0.5 tablets (25 mg  total) by mouth 2 (two) times daily as needed (racing heartbeat). 30 tablet 0   Syringe/Needle, Disp, (SYRINGE 3CC/23GX1") 23G X 1" 3 ML MISC 3 Syringes by Does not apply route every 30 (thirty) days. 3 each 3   HYDROcodone-acetaminophen (NORCO) 7.5-325 MG tablet TAKE 2 TABLETS BY MOUTH EVERY 8 HOURS AS NEEDED 180 tablet 0   methylphenidate (RITALIN) 10 MG tablet BRAND NAME NECESSARY.    Take six po daily in split doses 180 tablet 0   No facility-administered medications prior to visit.     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   Headache(784.0)    History of concussion YOUNG ADULT--  NO RESIDUAL   IBS (irritable bowel syndrome)    Movement disorder    MS (multiple sclerosis) (HCC)    MVP (mitral valve prolapse) HX HEART RACING  YRS AGO   ASYMPTOMATIC SINCE THEN   Vision abnormalities    Weakness of right leg SECONDARY TO MS    PAST SURGICAL HISTORY: Past Surgical History:  Procedure Laterality Date   CESAREAN SECTION  X3   BILATERAL TUBAL LIGATION W/ LAST ONE   DESTRUCTION OF ANAL CONDYLOMA  07-07-2005  DR TOTH   HERNIA REPAIR     KNEE ARTHROSCOPY WITH LATERAL MENISECTOMY  10/26/2012   Procedure: KNEE ARTHROSCOPY WITH LATERAL MENISECTOMY;  Surgeon: Javier Docker, MD;  Location: [facility];  Service: Orthopedics;  Laterality: Left;  Left Knee Arthrsocopy with Partial Lateral Menisectomy   LAPAROSCOPIC ASSISTED VAGINAL HYSTERECTOMY  01-15-2002  DR Gerlene Burdock HOLLAND/ DR TIM DAVIS   AND REPAIR VENTRAL HERNIA   LASER ABLATION CONDOLAMATA N/A 04/26/2013   Procedure: LASER ABLATION CONDOLAMATA;  Surgeon: Romie Levee, MD;  Location[facility];  Service: General;  Laterality: N/A;     TONSILLECTOMY  TOTAL KNEE ARTHROPLASTY Left 11/05/2015   Procedure: LEFT TOTAL KNEE ARTHROPLASTY;  Surgeon: Jene Every, MD;  Location: [facility];  Service: Orthopedics;  Laterality: Left;    FAMILY HISTORY: Family History  Problem Relation Age of Onset   Diabetes Mother    Hypertension Mother    Stroke Mother    Bowel Disease Brother    Diabetes Brother    Healthy Brother    Emphysema Father    Heart attack Father     SOCIAL HISTORY:  Social History   Socioeconomic History   Marital status: Married    Spouse name: Not on file   Number of children: 3   Years of education: Not on file   Highest education level: Not on file  Occupational History   Not on file  Social Needs   Financial resource strain: Not on file   Food insecurity:    Worry: Not on file    Inability: Not on file   Transportation needs:    Medical: Not on file    Non-medical: Not on file  Tobacco Use   Smoking status: Never Smoker   Smokeless tobacco: Never Used  Substance and Sexual Activity   Alcohol use: No   Drug use: No   Sexual activity: Not on file  Lifestyle   Physical activity:    Days per week: Not on file    Minutes per session: Not on file   Stress: Not on file  Relationships   Social connections:    Talks on phone: Not on file    Gets together: Not on file    Attends religious service: Not on file    Active member of club or organization: Not on file    Attends meetings of clubs or organizations: Not on file    Relationship status: Not on file   Intimate partner violence:    Fear of current or ex partner: Not on file    Emotionally abused: Not on file    Physically abused: Not on file    Forced sexual activity: Not on file  Other Topics Concern   Not on file  Social History Narrative   Not on file     PHYSICAL EXAM  There were no vitals filed for this visit.  There is no height or weight on file to calculate BMI.   General: The  patient is well-developed and well-nourished and in no acute distress   Neurologic Exam  Mental status: The patient is alert and oriented x 3 at the time of the examination. The patient has apparent normal recent and remote memory, with an apparently normal attention span and concentration ability.   Speech is normal.  Cranial nerves: The extraocular muscles are intact.  Facial strength and sensation is normal.  Trapezius strength is normal.  The tongue is midline, and the patient has symmetric elevation of the soft palate.   Motor:  Muscle bulk is normal. Tone is mildly increased in her legs.. Strength is 5/5  Sensory: Intact touch and vibration.  Coordination: Cerebellar testing shows mild right reduced heel to shin.  Finger to nose was normal today  Gait and station: Station is normal and gait is mildly wide.  The tandem gait is moderately wide.  The Romberg is mildly wide.  Reflexes: Deep tendon reflexes are symmetric and brisk bilaterally with crossed abductors at the knees. No ankle clonus.Marland Kitchen        DIAGNOSTIC DATA (LABS, IMAGING, TESTING) - I reviewed patient records, labs,  notes, testing and imaging myself where available.  Lab Results  Component Value Date   WBC 3.2 (L) 11/05/2018   HGB 14.3 11/05/2018   HCT 41.3 11/05/2018   MCV 87.9 11/05/2018   PLT 228 11/05/2018      Component Value Date/Time   NA 140 11/05/2018 1005   NA 142 03/17/2017 0937   K 4.2 11/05/2018 1005   CL 103 11/05/2018 1005   CO2 30 11/05/2018 1005   GLUCOSE 100 (H) 11/05/2018 1005   BUN 22 11/05/2018 1005   BUN 19 03/17/2017 0937   CREATININE 0.85 11/05/2018 1005   CALCIUM 9.5 11/05/2018 1005   PROT 6.7 11/05/2018 1005   PROT 7.3 03/17/2017 0937   ALBUMIN 4.7 08/05/2018 2212   ALBUMIN 4.8 03/17/2017 0937   AST 19 11/05/2018 1005   ALT 18 11/05/2018 1005   ALKPHOS 73 08/05/2018 2212   BILITOT 0.6 11/05/2018 1005   BILITOT 0.6 03/17/2017 0937   GFRNONAA 75 11/05/2018 1005   GFRAA 87  11/05/2018 1005   Lab Results  Component Value Date   CHOL 202 (H) 11/05/2018   HDL 95 11/05/2018   LDLCALC 97 11/05/2018   TRIG 35 11/05/2018   CHOLHDL 2.1 11/05/2018   Lab Results  Component Value Date   HGBA1C  03/10/2010    5.5 (NOTE) The ADA recommends the following therapeutic goal for glycemic control related to Hgb A1c measurement: Goal of therapy: <6.5 Hgb A1c  Reference: American Diabetes Association: Clinical Practice Recommendations 2010, Diabetes Care, 2010, 33: (Suppl  1).      ASSESSMENT AND PLAN  Multiple sclerosis (HCC)  Abnormality of gait  Urge incontinence  Dysesthesia    Brandin Dilday A. Epimenio Foot, MD, PhD 03/26/2019, 5:00 PM Certified in Neurology, Clinical Neurophysiology, Sleep Medicine, Pain Medicine and Neuroimaging  [facility] [address] [place], [place] [address] [phone] ]

## 2019-04-05 ENCOUNTER — Telehealth: Payer: Self-pay | Admitting: Internal Medicine

## 2019-04-05 ENCOUNTER — Ambulatory Visit (INDEPENDENT_AMBULATORY_CARE_PROVIDER_SITE_OTHER): Payer: 59 | Admitting: Internal Medicine

## 2019-04-05 ENCOUNTER — Encounter: Payer: Self-pay | Admitting: Internal Medicine

## 2019-04-05 VITALS — BP 138/78

## 2019-04-05 DIAGNOSIS — G35 Multiple sclerosis: Secondary | ICD-10-CM

## 2019-04-05 DIAGNOSIS — Z87898 Personal history of other specified conditions: Secondary | ICD-10-CM | POA: Diagnosis not present

## 2019-04-05 DIAGNOSIS — Z8679 Personal history of other diseases of the circulatory system: Secondary | ICD-10-CM | POA: Diagnosis not present

## 2019-04-05 MED ORDER — METOPROLOL TARTRATE 50 MG PO TABS: ORAL_TABLET | ORAL | 0 refills | Status: DC

## 2019-04-05 NOTE — Telephone Encounter (Signed)
Scheduled virtual Visit °

## 2019-04-05 NOTE — Telephone Encounter (Signed)
Set up visit please

## 2019-04-05 NOTE — Progress Notes (Signed)
   Subjective:    Patient ID: [name], female    DOB: 04/13/1961, 58 y.o.   MRN: [id number]  HPI 58 year old Female with multiple sclerosis and remote history of mitral valve prolapse with palpitations seen by interactive audio and video telecommunications today due to the coronavirus pandemic.  She is agreeable to visit in this format.  She is identified as [name]. [name] using 2 identifiers as a patient in this practice.  Patient says that she wants to keep beta-blocker on hand in case she has palpitations and that her prescription that she currently has is out of date.  She tried to call her neurologist but could not get in touch with him to receive this refilled today.  She is anxious about having this on hand.      Review of Systems denies current issues with palpitations, no shortness of breath or chest pain     Objective:   Physical Exam  Not examined but seen to be in no acute distress on virtual Doxy visit today     Assessment & Plan:  History of mitral valve prolapse  History of palpitations  Plan: Refill Lopressor 50 mg 1/2 tablet every 12 hours as needed palpitations #30 with no refill to [facility].

## 2019-04-05 NOTE — Telephone Encounter (Signed)
Tawna Simonton [phone]  metoprolol (LOPRESSOR) 50 MG tablet  Larraine called to say she needs a refill on above medication, she can not find her old bottle and feels like she needs to have some on hand incase she has a rapid heart beat episode. It looks like the last time it was filled was in 2016 by a hospitalitis. I let her know we might need to do a virtual visit to be able to fill this.Marland Kitchen

## 2019-04-28 NOTE — Patient Instructions (Signed)
Metoprolol 50 mg 1/2 tablet p.o. every 12 hours as needed for palpitations with no refill E scribed to [facility]

## 2019-04-29 ENCOUNTER — Other Ambulatory Visit: Payer: Self-pay | Admitting: Internal Medicine

## 2019-05-07 ENCOUNTER — Other Ambulatory Visit: Payer: Self-pay

## 2019-05-07 MED ORDER — ALPRAZOLAM 0.5 MG PO TABS: ORAL_TABLET | ORAL | 0 refills | Status: DC

## 2019-05-07 MED ORDER — HYDROCODONE-ACETAMINOPHEN 7.5-325 MG PO TABS: ORAL_TABLET | ORAL | 0 refills | Status: DC

## 2019-05-07 MED ORDER — METHYLPHENIDATE HCL 10 MG PO TABS: ORAL_TABLET | ORAL | 0 refills | Status: DC

## 2019-05-07 NOTE — Telephone Encounter (Signed)
Xanax [place] Database Verified LR: 07-13-2018 Qty: 90 Pending appointment: 07-30-2019  Ritalin [place] Database Verified LR: 04-11-2019 Qty: 180 Pending appointment: 07-30-2019  [place]co [place] Database Verified LR: 04-11-2019 Qty: 180 Pending appointment: 07-30-2019

## 2019-05-09 ENCOUNTER — Ambulatory Visit (INDEPENDENT_AMBULATORY_CARE_PROVIDER_SITE_OTHER): Payer: 59 | Admitting: Internal Medicine

## 2019-05-09 ENCOUNTER — Telehealth: Payer: Self-pay | Admitting: Internal Medicine

## 2019-05-09 ENCOUNTER — Encounter: Payer: Self-pay | Admitting: Internal Medicine

## 2019-05-09 VITALS — Ht 65.5 in

## 2019-05-09 DIAGNOSIS — R21 Rash and other nonspecific skin eruption: Secondary | ICD-10-CM

## 2019-05-09 DIAGNOSIS — L237 Allergic contact dermatitis due to plants, except food: Secondary | ICD-10-CM | POA: Diagnosis not present

## 2019-05-09 DIAGNOSIS — F439 Reaction to severe stress, unspecified: Secondary | ICD-10-CM | POA: Diagnosis not present

## 2019-05-09 MED ORDER — METHYLPREDNISOLONE ACETATE 80 MG/ML IJ SUSP
80.0000 mg | Freq: Once | INTRAMUSCULAR | Status: AC
  Administered 2019-05-09: 80 mg via INTRAMUSCULAR

## 2019-05-09 MED ORDER — TRIAMCINOLONE ACETONIDE 0.1 % EX CREA: 1.0000 "application " | TOPICAL_CREAM | Freq: Three times a day (TID) | CUTANEOUS | 0 refills | Status: DC

## 2019-05-09 NOTE — Telephone Encounter (Signed)
Done

## 2019-05-09 NOTE — Telephone Encounter (Signed)
Schedule OV today.

## 2019-05-09 NOTE — Telephone Encounter (Signed)
Pt has an itchy rash which came out 2 or 3 days after she had been in poison ivy, which she thinks it is, she has concerns about maybe shingles, but she doesn't have a fever. She said because of everything going on with her she didn't know if she should handle different than other people would if it is poison ivy

## 2019-06-01 ENCOUNTER — Encounter: Payer: Self-pay | Admitting: Internal Medicine
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
## 2019-06-01 NOTE — Patient Instructions (Signed)
Discussion regarding situational stress.  Depo-Medrol 80 mg IM.

## 2019-06-04 ENCOUNTER — Other Ambulatory Visit: Payer: Self-pay

## 2019-06-04 MED ORDER — HYDROCODONE-ACETAMINOPHEN 7.5-325 MG PO TABS: ORAL_TABLET | ORAL | 0 refills | Status: DC

## 2019-06-04 MED ORDER — METHYLPHENIDATE HCL 10 MG PO TABS: ORAL_TABLET | ORAL | 0 refills | Status: DC

## 2019-06-11 ENCOUNTER — Other Ambulatory Visit: Payer: Self-pay

## 2019-06-11 ENCOUNTER — Other Ambulatory Visit: Payer: 59 | Admitting: Internal Medicine

## 2019-06-11 DIAGNOSIS — Z87898 Personal history of other specified conditions: Secondary | ICD-10-CM

## 2019-06-11 DIAGNOSIS — E785 Hyperlipidemia, unspecified: Secondary | ICD-10-CM

## 2019-06-11 DIAGNOSIS — M1990 Unspecified osteoarthritis, unspecified site: Secondary | ICD-10-CM

## 2019-06-11 DIAGNOSIS — G8929 Other chronic pain: Secondary | ICD-10-CM

## 2019-06-11 DIAGNOSIS — B0229 Other postherpetic nervous system involvement: Secondary | ICD-10-CM

## 2019-06-11 DIAGNOSIS — G35 Multiple sclerosis: Secondary | ICD-10-CM

## 2019-06-11 DIAGNOSIS — R519 Headache, unspecified: Secondary | ICD-10-CM

## 2019-06-11 DIAGNOSIS — I480 Paroxysmal atrial fibrillation: Secondary | ICD-10-CM

## 2019-06-11 DIAGNOSIS — E559 Vitamin D deficiency, unspecified: Secondary | ICD-10-CM

## 2019-06-12 LAB — CBC WITH DIFFERENTIAL/PLATELET
Absolute Monocytes: 423 cells/uL (ref 200–950)
Basophils Absolute: 41 cells/uL (ref 0–200)
Basophils Relative: 0.9 %
Eosinophils Absolute: 18 cells/uL (ref 15–500)
Eosinophils Relative: 0.4 %
HCT: 40.8 % (ref 35.0–45.0)
Hemoglobin: 13.8 g/dL (ref 11.7–15.5)
Lymphs Abs: 1148 cells/uL (ref 850–3900)
MCH: 30.5 pg (ref 27.0–33.0)
MCHC: 33.8 g/dL (ref 32.0–36.0)
MCV: 90.3 fL (ref 80.0–100.0)
MPV: 10.7 fL (ref 7.5–12.5)
Monocytes Relative: 9.4 %
Neutro Abs: 2871 cells/uL (ref 1500–7800)
Neutrophils Relative %: 63.8 %
Platelets: 304 10*3/uL (ref 140–400)
RBC: 4.52 10*6/uL (ref 3.80–5.10)
RDW: 12.8 % (ref 11.0–15.0)
Total Lymphocyte: 25.5 %
WBC: 4.5 10*3/uL (ref 3.8–10.8)

## 2019-06-12 LAB — LIPID PANEL
Cholesterol: 174 mg/dL (ref <200)
HDL: 88 mg/dL (ref >=50)
LDL Cholesterol (Calc): 74 mg/dL (calc)
Non-HDL Cholesterol (Calc): 86 mg/dL (calc) (ref <130)
Total CHOL/HDL Ratio: 2 (calc) (ref <5.0)
Triglycerides: 50 mg/dL (ref <150)

## 2019-06-12 LAB — COMPLETE METABOLIC PANEL WITH GFR
AG Ratio: 1.7 (calc) (ref 1.0–2.5)
ALT: 28 U/L (ref 6–29)
AST: 27 U/L (ref 10–35)
Albumin: 4.2 g/dL (ref 3.6–5.1)
Alkaline phosphatase (APISO): 78 U/L (ref 37–153)
BUN: 18 mg/dL (ref 7–25)
CO2: 32 mmol/L (ref 20–32)
Calcium: 9.5 mg/dL (ref 8.6–10.4)
Chloride: 102 mmol/L (ref 98–110)
Creat: 0.72 mg/dL (ref 0.50–0.99)
GFR, Est African American: 105 mL/min/{1.73_m2} (ref >=60)
GFR, Est Non African American: 91 mL/min/{1.73_m2} (ref >=60)
Globulin: 2.5 g/dL (calc) (ref 1.9–3.7)
Glucose, Bld: 96 mg/dL (ref 65–99)
Potassium: 4.7 mmol/L (ref 3.5–5.3)
Sodium: 138 mmol/L (ref 135–146)
Total Bilirubin: 0.5 mg/dL (ref 0.2–1.2)
Total Protein: 6.7 g/dL (ref 6.1–8.1)

## 2019-06-12 LAB — TSH: TSH: 1.64 mIU/L (ref 0.40–4.50)

## 2019-06-12 LAB — VITAMIN D 25 HYDROXY (VIT D DEFICIENCY, FRACTURES): Vit D, 25-Hydroxy: 24 ng/mL — ABNORMAL LOW (ref 30–100)

## 2019-06-13 ENCOUNTER — Other Ambulatory Visit: Payer: Self-pay

## 2019-06-13 ENCOUNTER — Ambulatory Visit (INDEPENDENT_AMBULATORY_CARE_PROVIDER_SITE_OTHER): Payer: 59 | Admitting: Internal Medicine

## 2019-06-13 ENCOUNTER — Encounter: Payer: Self-pay | Admitting: Internal Medicine

## 2019-06-13 VITALS — BP 130/80 | HR 80 | Ht 65.5 in | Wt 147.0 lb

## 2019-06-13 DIAGNOSIS — F439 Reaction to severe stress, unspecified: Secondary | ICD-10-CM | POA: Diagnosis not present

## 2019-06-13 DIAGNOSIS — G35 Multiple sclerosis: Secondary | ICD-10-CM

## 2019-06-13 DIAGNOSIS — E559 Vitamin D deficiency, unspecified: Secondary | ICD-10-CM

## 2019-06-13 DIAGNOSIS — Z0000 Encounter for general adult medical examination without abnormal findings: Secondary | ICD-10-CM | POA: Diagnosis not present

## 2019-06-13 DIAGNOSIS — R5383 Other fatigue: Secondary | ICD-10-CM | POA: Diagnosis not present

## 2019-06-13 DIAGNOSIS — Z87898 Personal history of other specified conditions: Secondary | ICD-10-CM | POA: Diagnosis not present

## 2019-06-13 DIAGNOSIS — Z8701 Personal history of pneumonia (recurrent): Secondary | ICD-10-CM

## 2019-06-13 LAB — POCT URINALYSIS DIPSTICK
Appearance: NEGATIVE
Bilirubin, UA: NEGATIVE
Blood, UA: NEGATIVE
Glucose, UA: NEGATIVE
Ketones, UA: NEGATIVE
Leukocytes, UA: NEGATIVE
Nitrite, UA: NEGATIVE
Odor: NEGATIVE
Protein, UA: NEGATIVE
Spec Grav, UA: 1.02 (ref 1.010–1.025)
Urobilinogen, UA: 0.2 E.U./dL
pH, UA: 6.5 (ref 5.0–8.0)

## 2019-06-30 NOTE — Patient Instructions (Signed)
Continue current medications and follow-up in 1 year or as needed.  Have annual mammogram.
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
## 2019-07-02 ENCOUNTER — Other Ambulatory Visit: Payer: Self-pay

## 2019-07-02 MED ORDER — METHYLPHENIDATE HCL 10 MG PO TABS: ORAL_TABLET | ORAL | 0 refills | Status: DC

## 2019-07-02 MED ORDER — HYDROCODONE-ACETAMINOPHEN 7.5-325 MG PO TABS: ORAL_TABLET | ORAL | 0 refills | Status: DC

## 2019-07-28 ENCOUNTER — Other Ambulatory Visit: Payer: Self-pay | Admitting: Internal Medicine

## 2019-07-30 ENCOUNTER — Ambulatory Visit: Payer: 59 | Admitting: Neurology

## 2019-07-30 ENCOUNTER — Encounter: Payer: Self-pay | Admitting: Neurology

## 2019-07-30 ENCOUNTER — Other Ambulatory Visit: Payer: Self-pay

## 2019-07-30 VITALS — BP 129/82 | HR 74 | Temp 96.8°F | Ht 65.5 in | Wt 149.0 lb

## 2019-07-30 DIAGNOSIS — R208 Other disturbances of skin sensation: Secondary | ICD-10-CM

## 2019-07-30 DIAGNOSIS — F988 Other specified behavioral and emotional disorders with onset usually occurring in childhood and adolescence: Secondary | ICD-10-CM

## 2019-07-30 DIAGNOSIS — N3941 Urge incontinence: Secondary | ICD-10-CM

## 2019-07-30 DIAGNOSIS — R269 Unspecified abnormalities of gait and mobility: Secondary | ICD-10-CM

## 2019-07-30 DIAGNOSIS — G35 Multiple sclerosis: Secondary | ICD-10-CM

## 2019-07-30 DIAGNOSIS — R5382 Chronic fatigue, unspecified: Secondary | ICD-10-CM

## 2019-07-30 MED ORDER — HYDROCODONE-ACETAMINOPHEN 7.5-325 MG PO TABS: ORAL_TABLET | ORAL | 0 refills | Status: DC

## 2019-07-30 MED ORDER — METHYLPHENIDATE HCL 10 MG PO TABS: ORAL_TABLET | ORAL | 0 refills | Status: DC

## 2019-07-30 NOTE — Progress Notes (Signed)
[facility]  PATIENT: [name] DOB: 05-05-1961 REASON FOR VISIT: Multiple sclerosis   HISTORICAL  CHIEF COMPLAINT:  Chief Complaint  Patient presents with  . Follow-up    RM 12, alone. Last seen 03/26/2019.   . Multiple Sclerosis    On Tecfidera.     HISTORY OF PRESENT ILLNESS:  [name] is a 58 y.o. woman with multiple sclerosis diagnosed in 2008.    Update 07/30/2019: Feels she has been mostly stable since her last virtual visit.  She is on Tecfidera as her disease modifying therapy for MS.  Last lymphocytes in July were 1.1.   She does not note any recent exacerbations or significant new neurologic symptoms.   Gait is doing about the same as last year.   She has some arthritic changes also affecting gait some.      She does note more urinary urgency and frequency.   She wears Poise.  She denies burning.   Fatigue is doing ok.  She is sleeping ok but feels she could use a little more.  Ritalin helps the fatigue and alertness/attention  She is taking B12 and D vitamins.   Vit D was 24 ng/mL (> 30 normal) and is now taking the 50000 U weekly.    Update 03/26/2019 (virtual) She feels her MS is mostly stable though she felt worse for a few weeks.   She reported feeling sick in March with an URI and a fever.   She was placed on a Z-pak and URi symptoms improved.  She feels her gait is a little worse but she notes her recent infection associated with more fatigue and also she has knee pain.     She has some numbness and tingling in her legs.   She has some urge incontinence and uses Poise pads.   Vision is stable but she noted diplopia with   [name] has been a problem for a while but is always wore for a couple weeks after any infection.   Ritalin has helped fatigue and also helps her focus/attention.     Vitamin D is low and we discussed a higher supplement.      Update 11/15/2018: She is on Tecfidera and she tolerates it well.   No exacerbationShe  has had an URI the past week and felt more fatigued.   She was placed on Tamiflu, Z-pak and prednisone.    She always feels weaker and more tired when she has an infection.   She has a torn meniscus on the right and gait is worse.   Strength and sensation are unchanged.   She needs to wear Poise Pads for her bladder due to leakage.     Ritalin helps her focus and attention and fatigue.  She is sleeping well most night.    Gabapentin helps the leg and torso pain at night and also helps her sleep.   Mood is doing ok -- worried about her 60 yo son in recent motorcycle accident requiring multiple orthopedic operations.    Recent labs show normal lymphocyte count, low vit D (16) and normal CMP and TSH.    Update 07/02/2018: She feels her MS is slightly worse, with m,ore gait and bladder issues.   She is on Tecfidera and she tolerates it well.     Her gait is mildly worse and she falls at last once a month but stumbles frequently.   Outside her house is not flat.   She has a right  foot drop.  She also has right knee pain (has had surgery for torn meniscus).   She also has lower bak pain and in the past was told she  Had lumbar spinal stenosis.   [facility]   She has dysesthesias and pain in her thoracic spine.    She never took the oxcarbazepine.    Her bladder is doing worse.   She now wears pads all the time.  She has incontinence almost daily.    She has no hesitancy and feels that she empties.     She is not on any medication.   She was once on a bladder medication but had side effects and stopped.    She has a lot of fatigue and walks worse when more tired.   Fatigue is worse with heat.  Focus and attention have improved on Ritalin.  It also helps her fatigue some.  Update 02/26/2018: She denies any new exacerbations since her last visit.  She is on Tecfidera.  She tolerates it well.  She may have had one exacerbation at the end of last year.  A little before her  January 2019 visit, she was experiencing more right  sided weakness.  MRI of the cervical and thoracic spine was checked.  She has multiple T2 hyperintense lesions in the spinal cord the largest to the right C2-C3 and smaller ones at C3-C4, C4, C5-C6, C6-C7 and T1-T2 all the foci were present compared to the 2016 MRI.   The MRI of the thoracic spine showed small foci adjacent to T2 and T5-6.  None of the spinal plaques appeared to be acute.      Her gait is back to baseline and she is walking without a cane.  She still gets intermittent episodes where she will feel more off balance.  She notes mild weakness and clumsiness on the right side right now.  She gets burning pain, helped by gabapentin.   Addition of Trileptal did not help further and she stopped.  She is on hydrocodone 7.5 up to 6 a day for her chronic back pain, dysesthesias and headaches.  She has not shown any drug-seeking behavior  Bladder function is variable with urgency and rare incontinence.  Vision is stable.  She continues to report fatigue and also has decreased focus and attention.  Both issues are helped by Ritalin.  She tolerates it well.   She has heat intolerance.    Mood is doing fairly well.   She was more down in December 2018/Jan 2019 due to death of 2 people she knew.     She has LBP and ESI's have helped in the past (Ramos).    Update 12/11/2017:  She feels weaker on the right side.     This started 2 weeks ago with dragging of the right foot, but is much worse the past 5-6 days.    Weakness is arm, trunk and leg and she leans to the right even with sitting.   She notes she is less able to carry items in her.    Of note, she was having more pain down the arm before the symptoms started.    She notes lower neck pain but no arm pain .    MRI cervical spine in 2016 showed Foci to the right at C3, to the left at C4, to the left at C5-C6 (only seen on axial images) and medial at C6-C7. There is also  a focus in the thoracic spine at  T2.     Trileptal increased her headaches and she stopped after one week.   She is also on hydrocodone and gabapentin for pain.    She notes some bladder frequency but is uncertain of this has changed any. No change in bowel.  She tolerates Tecfidera well.       Update 10/30/2017:    She is on Tecfidera and tolerates it well.   She has no recent exacerbation.   She is noting more of a right foot drop.   This seems associated with LBP the past few days.   She notes some weakness in that leg.     She notes having a lumbar spine MRI last year [employer]) and was told she has spinal stenosis.  I reviewed the MRI report which we had faxed to [place]. At L3-L4 she has mild spinal stenosis and also has facet and ligamentous hypertrophy at L4-L5 and L5S1 she has moderate sized disc bulges but does not have spinal stenosis at those levels.  She had an ESI by Dr. Ethelene Hal late 2017 and had benefit until recently.    The truncal dysesthesias bother her mostly at night.  She Is on gabapentin 600 mg po bid and it makes her sleepy if she goes to higher dose.    She notes no change in her bladder and wears pads since she has occasional incontinence.  No new visual problems.  She is sleeping ok on current med's.   She notes ritalin helps fatigue and cognitive fog/focus.    ___________________________________ From 06/19/2017: Since the last visit, she has had bronchitis and feels weaker and more fatigued.   She is having trouble Ritalin 20 mg.    MS:   She was feeling much more fatigued earlier this year.   Vit D was low (13.2) 03/17/2017.   CBC showed low WBC and slightly low neutrophils and normal lymphocytes.   She is on Tecfidera and denies exacerbation and she tolerates it well.   Last lymphocyte count was 1.1 in  April.    She notes cognitive and physical fatigue are bad and today is a worse than typical day..          Gait/Strength/sensation:   She feels walking is worse and balance is poor.    The right  leg drags if she walks more than a short distance.   She also has right greater than left leg clumsiness.  She uses a cane often and has no recent falls..    She has dysesthesias in her feet and milder in her hands.    Pain is burning and left > right up to the mid shin.    She gets a tight sensation and a poking sensation in the thoracic spine region.   Baclofen and tizanidine were not well tolerated.  PHN:   She had shingles at the sternum and down left arm in December 2016 after her knee surgery.   She now takes only 600 mg gabapentin at night as higher doses were not well tolerated.  She is also on hydrocodone up to 6/day.   Shingles occurred at time of left knee surgery  Bladder:   She has had some bladder dysfunction and has mild incontinence. She there is some hesitancy but perhaps also was not tolerated.    Vision:    She is doing about the same with occasional diplopia, worse when tired.   An early exacerbation was  diplopia.    No h/o optic neuritis.   No color asymmetry.   Fatigue/sleep:   She reports fatigue is both physical and cognitive. Ritalin (brand name) has helped her fatigue more than anything else.. The fatigue is worse in the heat.    She has some attentional deficits helped by Ritalin.  Sleep is better on gabapentin at night.   Xanax help sleep onset.   She has nocturia x 4 some night but usually falls back asleep  Mood/cognition:  She has a mild depression and mild anxiety.  She feels these issues are stable.  She had a severe depression around 2010 and was on an antidepressant but did not tolerate it well.  She also has had some anxiety helped by Xanax..   She notes a brain fog at times and  Ritalin has helped a lot and she tolerates it well.      She has some word finding problems and decreased attention.    MS History:  In 2008, she had an MRI of the brain performed which was consistent with multiple sclerosis and she was diagnosed with relapsing remitting MS. He was initially  placed on Avonex for therapy. She had some exacerbations with weakness and clumsiness with her gait and she started to see Dr. Jacqulynn Cadet. He placed her on Tysabri she is on Tysabri between 2010 and 2013. 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      REVIEW OF SYSTEMS:  Constitutional: No fevers, chills, sweats, or change in appetite.  She notes fatigue Eyes: No visual changes,eye pain.   She has intermittent diplopia Ear, nose and throat: No hearing loss, ear pain, nasal congestion, sore throat Cardiovascular: No chest pain, palpitations Respiratory:  No shortness of breath at rest or with exertion.   No wheezes GastrointestinaI: No nausea, vomiting, diarrhea, abdominal pain, fecal incontinence Genitourinary:  a sabove Musculoskeletal:  she has both neck pain and back pain. Also bilat knee pain Integumentary: No rash, pruritus, skin lesions.   She has Raynauds in her hands  Neurological: as above Psychiatric: No depression at this time.  Anxiety doing ok on med's Endocrine: No palpitations, diaphoresis, change in appetite, change in weigh or increased thirst Hematologic/Lymphatic:  No anemia, purpura, petechiae. Allergic/Immunologic: No itchy/runny eyes, nasal congestion, recent allergic reactions, rashes  ALLERGIES: Allergies  Allergen Reactions  . Ibuprofen Other (See Comments)    CAUSES LYMPHEDEMA  . Nsaids Other (See Comments)    AVOIDS ANY SODIUM CONTAINING MED. -- CAUSES LYMPHEDEMA  . Other Swelling and Other (See Comments)    Laxatives- causes leg swelling artificial sweeteners    HOME MEDICATIONS: Outpatient Medications Prior to Visit  Medication Sig Dispense Refill  . ALPRAZolam (XANAX) 0.5 MG tablet TAKE 1 TABLET BY MOUTH 3 TIMES A DAY AS NEEDED FOR ANXIETY/SLEEP 90 tablet 0  . calcium-vitamin D 250-100 MG-UNIT tablet Take 1 tablet by mouth 2 (two) times daily.    .  cyanocobalamin (,VITAMIN B-12,) 1000 MCG/ML injection USE 1 IM TWICE PER MONTH 2 mL 19  . Dimethyl Fumarate (TECFIDERA) 240 MG CPDR One po bid. 60 capsule 12  . gabapentin (NEURONTIN) 600 MG tablet TAKE 1 TABLET (600 MG TOTAL) BY MOUTH 2 (TWO) TIMES DAILY. 180 tablet 3  . HYDROcodone-acetaminophen (NORCO) 7.5-325 MG tablet TAKE 2 TABLETS BY MOUTH EVERY 8 HOURS AS NEEDED 180 tablet 0  . hyoscyamine (LEVSIN SL)  0.125 MG SL tablet Place 1 tablet (0.125 mg total) under the tongue every 4 (four) hours as needed. 270 tablet 3  . Magnesium 200 MG TABS Take 200 mg by mouth daily. +malic acid    . methylphenidate (RITALIN) 10 MG tablet BRAND NAME NECESSARY.    Take six po daily in split doses 180 tablet 0  . metoprolol tartrate (LOPRESSOR) 50 MG tablet TAKE 1/2 TABLET BY MOUTH EVERY 12 HOURS AS NEEDED FOR PALPITATIONS 30 tablet 2  . Syringe/Needle, Disp, (SYRINGE 3CC/23GX1") 23G X 1" 3 ML MISC 3 Syringes by Does not apply route every 30 (thirty) days. 3 each 3  . Vitamin D, Ergocalciferol, (DRISDOL) 1.25 MG (50000 UT) CAPS capsule Take 1 capsule (50,000 Units total) by mouth every 7 (seven) days. 13 capsule 3   No facility-administered medications prior to visit.     PAST MEDICAL HISTORY: Past Medical History:  Diagnosis Date  . ADD (attention deficit disorder)   . Anxiety   . Arthritis   . Bladder incontinence    wears a pad  . Bone spur    cervical bone spurs  . Chronic fatigue   . Dysrhythmia    hx A-FIB  . 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 Headache(784.0)   . History of concussion YOUNG ADULT--  NO RESIDUAL  . IBS (irritable bowel syndrome)   . Movement disorder   . MS (multiple sclerosis) (HCC)   . MVP (mitral valve prolapse) HX HEART RACING  YRS AGO   ASYMPTOMATIC SINCE THEN  . Vision abnormalities   . Weakness of right leg SECONDARY TO MS    PAST SURGICAL HISTORY: Past Surgical History:  Procedure Laterality Date  . CESAREAN SECTION  X3   BILATERAL TUBAL LIGATION  W/ LAST ONE  . DESTRUCTION OF ANAL CONDYLOMA  07-07-2005  DR TOTH  . HERNIA REPAIR    . KNEE ARTHROSCOPY WITH LATERAL MENISECTOMY  10/26/2012   Procedure: KNEE ARTHROSCOPY WITH LATERAL MENISECTOMY;  Surgeon: Javier DockerJeffrey C Beane, MD;  [facility];  Service: Orthopedics;  Laterality: Left;  Left Knee Arthrsocopy with Partial Lateral Menisectomy  . LAPAROSCOPIC ASSISTED VAGINAL HYSTERECTOMY  01-15-2002  DR Gerlene BurdockICHARD HOLLAND/ DR TIM DAVIS   AND REPAIR VENTRAL HERNIA  . LASER ABLATION CONDOLAMATA N/A 04/26/2013   Procedure: LASER ABLATION CONDOLAMATA;  Surgeon: Romie LeveeAlicia Thomas, MD;  [facility];  Service: General;  Laterality: N/A;  . TONSILLECTOMY    . TOTAL KNEE ARTHROPLASTY Left 11/05/2015   Procedure: LEFT TOTAL KNEE ARTHROPLASTY;  Surgeon: Jene EveryJeffrey Beane, MD;  Location: [facility];  Service: Orthopedics;  Laterality: Left;    FAMILY HISTORY: Family History  Problem Relation Age of Onset  . Diabetes Mother   . Hypertension Mother   . Stroke Mother   . Bowel Disease Brother   . Diabetes Brother   . Healthy Brother   . Emphysema Father   . Heart attack Father     SOCIAL HISTORY:  Social History   Socioeconomic History  . Marital status: Married    Spouse name: Not on file  . Number of children: 3  . Years of education: Not on file  . Highest education level: Not on file  Occupational History  . Not on file  Social Needs  . Financial resource strain: Not on file  . Food insecurity    Worry: Not on file    Inability: Not on file  . Transportation needs    Medical:  Not on file    Non-medical: Not on file  Tobacco Use  . Smoking status: Never Smoker  . Smokeless tobacco: Never Used  Substance and Sexual Activity  . Alcohol use: No  . Drug use: No  . Sexual activity: Not on file  Lifestyle  . Physical activity    Days per week: Not on file    Minutes per session: Not on file  . Stress: Not on file  Relationships  . Social  Musician on phone: Not on file    Gets together: Not on file    Attends religious service: Not on file    Active member of club or organization: Not on file    Attends meetings of clubs or organizations: Not on file    Relationship status: Not on file  . Intimate partner violence    Fear of current or ex partner: Not on file    Emotionally abused: Not on file    Physically abused: Not on file    Forced sexual activity: Not on file  Other Topics Concern  . Not on file  Social History Narrative  . Not on file     PHYSICAL EXAM  Vitals:   07/30/19 1333  BP: 129/82  Pulse: 74  Temp: (!) 96.8 F (36 C)  Weight: 149 lb (67.6 kg)  Height: 5' 5.5" (1.664 m)    Body mass index is 24.42 kg/m.   General: The patient is well-developed and well-nourished and in no acute distress   Neurologic Exam  Mental status: The patient is alert and oriented x 3 at the time of the examination. The patient has apparent normal recent and remote memory, with an apparently normal attention span and concentration ability.   Speech is normal.  Cranial nerves: The extraocular muscles are intact.  Facial strength and sensation is normal.  Trapezius strength is normal.  The tongue is midline, and the patient has symmetric elevation of the soft palate.   Motor:  Muscle bulk is normal. Tone is mildly increased in her legs.. Strength is 5/5  Sensory: Normal touch in all 4 limbs.  Coordination: Cerebellar testing shows mild right reduced heel to shin.  Finger to nose was normal today  Gait and station: Station is normal and gait is mildly wide but does not need a cane.  The tandem gait is moderately wide.  The Romberg is mildly wide.  Reflexes: Deep tendon reflexes are symmetric and brisk bilaterally with crossed abductors at the knees. No ankle clonus.Marland Kitchen        DIAGNOSTIC DATA (LABS, IMAGING, TESTING) - I reviewed patient records, labs, notes, testing and imaging myself where  available.  Lab Results  Component Value Date   WBC 4.5 06/11/2019   HGB 13.8 06/11/2019   HCT 40.8 06/11/2019   MCV 90.3 06/11/2019   PLT 304 06/11/2019      Component Value Date/Time   NA 138 06/11/2019 1145   NA 142 03/17/2017 0937   K 4.7 06/11/2019 1145   CL 102 06/11/2019 1145   CO2 32 06/11/2019 1145   GLUCOSE 96 06/11/2019 1145   BUN 18 06/11/2019 1145   BUN 19 03/17/2017 0937   CREATININE 0.72 06/11/2019 1145   CALCIUM 9.5 06/11/2019 1145   PROT 6.7 06/11/2019 1145   PROT 7.3 03/17/2017 0937   ALBUMIN 4.7 08/05/2018 2212   ALBUMIN 4.8 03/17/2017 0937   AST 27 06/11/2019 1145   ALT 28 06/11/2019 1145   ALKPHOS  73 08/05/2018 2212   BILITOT 0.5 06/11/2019 1145   BILITOT 0.6 03/17/2017 0937   GFRNONAA 91 06/11/2019 1145   GFRAA 105 06/11/2019 1145   Lab Results  Component Value Date   CHOL 174 06/11/2019   HDL 88 06/11/2019   LDLCALC 74 06/11/2019   TRIG 50 06/11/2019   CHOLHDL 2.0 06/11/2019   Lab Results  Component Value Date   HGBA1C  03/10/2010    5.5 (NOTE) The ADA recommends the following therapeutic goal for glycemic control related to Hgb A1c measurement: Goal of therapy: <6.5 Hgb A1c  Reference: American Diabetes Association: Clinical Practice Recommendations 2010, Diabetes Care, 2010, 33: (Suppl  1).      ASSESSMENT AND PLAN    1. Multiple sclerosis (HCC)   2. Abnormality of gait   3. Attention deficit disorder, unspecified hyperactivity presence   4. Dysesthesia   5. Chronic fatigue   6. Urge incontinence    1.   Continue Tecfidera.   She has recent labs (ALC = 1.1).   Check MRI to determine if subclinical progression.   If present, consider a different DMT 2.   Can alternate visits with NP. 3.   Renew med's.  The [place] controlled substance database was reviewed and she has been compliant.  She has not shown any drug-seeking behavior. 4.   Follow-up in 4 months or sooner if there are new or worsening neurologic symptoms.   Deneisha Dade A. Epimenio Foot, MD, PhD 07/30/2019, 1:46 PM Certified in Neurology, Clinical Neurophysiology, Sleep Medicine, Pain Medicine and Neuroimaging  [facility] [address] [place], [place] [address] [phone] ]

## 2019-07-31 ENCOUNTER — Telehealth: Payer: Self-pay | Admitting: Neurology

## 2019-07-31 NOTE — Telephone Encounter (Signed)
spoke to the patient she is going to look into some stuff with her insurance and then get back to me  [employer] auth: [place] via [facility]

## 2019-08-19 ENCOUNTER — Telehealth: Payer: Self-pay | Admitting: *Deleted

## 2019-08-19 DIAGNOSIS — G35 Multiple sclerosis: Secondary | ICD-10-CM

## 2019-08-19 MED ORDER — TECFIDERA 240 MG PO CPDR: DELAYED_RELEASE_CAPSULE | ORAL | 12 refills | Status: DC

## 2019-08-19 NOTE — Addendum Note (Signed)
Addended by: Hope Pigeon on: 08/19/2019 01:51 PM   Modules accepted: Orders

## 2019-08-19 NOTE — Telephone Encounter (Signed)
Submitted PA Tecfidera on CMM. Key: [id number]. Waiting on determination from [employer]

## 2019-08-19 NOTE — Telephone Encounter (Signed)
Request Reference Number: [id number]. TECFIDERA CAP 240MG  is approved through 08/18/2024. For further questions, call [phone].

## 2019-09-01 ENCOUNTER — Other Ambulatory Visit: Payer: Self-pay

## 2019-09-02 MED ORDER — HYDROCODONE-ACETAMINOPHEN 7.5-325 MG PO TABS: ORAL_TABLET | ORAL | 0 refills | Status: DC

## 2019-09-02 MED ORDER — METHYLPHENIDATE HCL 10 MG PO TABS: ORAL_TABLET | ORAL | 0 refills | Status: DC

## 2019-09-02 NOTE — Telephone Encounter (Signed)
Ritalin:  [place] Database Verified LR: 08-06-2019 Qty: 180 Pending appointment: 11-19-2019 Debbora Presto, NP)  Hydrocodone: [place] Database Verified LR: 08-06-2019 Qty: 180 Pending appointment: 11-19-2019 Debbora Presto, NP)

## 2019-09-25 ENCOUNTER — Other Ambulatory Visit: Payer: Self-pay

## 2019-09-26 MED ORDER — ALPRAZOLAM 0.5 MG PO TABS: ORAL_TABLET | ORAL | 2 refills | Status: DC

## 2019-09-27 ENCOUNTER — Other Ambulatory Visit: Payer: Self-pay | Admitting: Internal Medicine

## 2019-09-30 ENCOUNTER — Other Ambulatory Visit: Payer: Self-pay

## 2019-10-01 MED ORDER — HYDROCODONE-ACETAMINOPHEN 7.5-325 MG PO TABS: ORAL_TABLET | ORAL | 0 refills | Status: DC

## 2019-10-01 MED ORDER — METHYLPHENIDATE HCL 10 MG PO TABS: ORAL_TABLET | ORAL | 0 refills | Status: DC

## 2019-10-29 ENCOUNTER — Other Ambulatory Visit: Payer: Self-pay

## 2019-10-29 MED ORDER — METHYLPHENIDATE HCL 10 MG PO TABS: ORAL_TABLET | ORAL | 0 refills | Status: DC

## 2019-10-29 MED ORDER — HYDROCODONE-ACETAMINOPHEN 7.5-325 MG PO TABS: ORAL_TABLET | ORAL | 0 refills | Status: DC

## 2019-11-19 ENCOUNTER — Other Ambulatory Visit: Payer: Self-pay

## 2019-11-19 ENCOUNTER — Encounter: Payer: Self-pay | Admitting: Family Medicine

## 2019-11-19 ENCOUNTER — Ambulatory Visit: Payer: 59 | Admitting: Family Medicine

## 2019-11-19 VITALS — BP 118/74 | HR 60 | Temp 97.9°F | Ht 65.0 in | Wt 156.6 lb

## 2019-11-19 DIAGNOSIS — R208 Other disturbances of skin sensation: Secondary | ICD-10-CM

## 2019-11-19 DIAGNOSIS — G35 Multiple sclerosis: Secondary | ICD-10-CM | POA: Diagnosis not present

## 2019-11-19 DIAGNOSIS — F988 Other specified behavioral and emotional disorders with onset usually occurring in childhood and adolescence: Secondary | ICD-10-CM

## 2019-11-19 DIAGNOSIS — R5382 Chronic fatigue, unspecified: Secondary | ICD-10-CM

## 2019-11-19 NOTE — Progress Notes (Signed)
PATIENT: [name] DOB: 04/20/1961  REASON FOR VISIT: follow up HISTORY FROM: patient  Chief Complaint  Patient presents with  . Follow-up    4 mon f/u. Alone. Rm 5. Patient stated that she would like to stay on Tecfidera and not switch to generic.      HISTORY OF PRESENT ILLNESS: Today 11/20/19 [name] is a 58 y.o. female here today for follow up for MS. She continues Tecfidera. She feels that symptoms are stable. No vision, gait or bowel/bladder changes. Mood is stable. MRI was ordered by Dr Epimenio Foot in August but has not been completed.   Gabapentin  at bedtime for neuropathy. Extra dose during the day make her feel loopy. She takes Norco during the day (morning, afternoon and evening) for chronic pain. She takes magnesium at night for muscle aches and sleep aid. She rarely uses hyoscyamine for IBS. She rarely uses Xanax. Maybe 3-4 times a month. She takes Ritalin  in am and  in afternoons for fatigue. She continues B12 injections every two weeks at home and 50,000iu weekly of vitamin D.   HISTORY: (copied from Dr Bonnita Hollow note on 07/30/2019)   [name] is a 58 y.o. woman with multiple sclerosis diagnosed in 2008.    Update 07/30/2019: Feels she has been mostly stable since her last virtual visit.  She is on Tecfidera as her disease modifying therapy for MS.  Last lymphocytes in July were 1.1.   She does not note any recent exacerbations or significant new neurologic symptoms.   Gait is doing about the same as last year.   She has some arthritic changes also affecting gait some.      She does note more urinary urgency and frequency.   She wears Poise.  She denies burning.   Fatigue is doing ok.  She is sleeping ok but feels she could use a little more.  Ritalin helps the fatigue and alertness/attention  She is taking B12 and D vitamins.   Vit D was 24 ng/mL (> 30 normal) and is now taking the 50000 U weekly.    Update 03/26/2019 (virtual) She  feels her MS is mostly stable though she felt worse for a few weeks.   She reported feeling sick in March with an URI and a fever.   She was placed on a Z-pak and URi symptoms improved.  She feels her gait is a little worse but she notes her recent infection associated with more fatigue and also she has knee pain.     She has some numbness and tingling in her legs.   She has some urge incontinence and uses Poise pads.   Vision is stable but she noted diplopia with   [name] has been a problem for a while but is always wore for a couple weeks after any infection.   Ritalin has helped fatigue and also helps her focus/attention.     Vitamin D is low and we discussed a higher supplement.     Update 11/15/2018: She is on Tecfidera and she tolerates it well.   No exacerbationShe has had an URI the past week and felt more fatigued.   She was placed on Tamiflu, Z-pak and prednisone.    She always feels weaker and more tired when she has an infection.   She has a torn meniscus on the right and gait is worse.   Strength and sensation are unchanged.   She needs to wear Poise Pads for  her bladder due to leakage.     Ritalin helps her focus and attention and fatigue.  She is sleeping well most night.    Gabapentin helps the leg and torso pain at night and also helps her sleep.   Mood is doing ok -- worried about her 10 yo son in recent motorcycle accident requiring multiple orthopedic operations.    Recent labs show normal lymphocyte count, low vit D (16) and normal CMP and TSH.    Update 07/02/2018: She feels her MS is slightly worse, with m,ore gait and bladder issues.   She is on Tecfidera and she tolerates it well.     Her gait is mildly worse and she falls at last once a month but stumbles frequently.   Outside her house is not flat.   She has a right foot drop.  She also has right knee pain (has had surgery for torn meniscus).   She also has lower bak pain and in the past was told she  Had lumbar  spinal stenosis.   Dr. Ethelene Hal has done [facility] in the past (2016 and 2017).   She has dysesthesias and pain in her thoracic spine.    She never took the oxcarbazepine.    Her bladder is doing worse.   She now wears pads all the time.  She has incontinence almost daily.    She has no hesitancy and feels that she empties.     She is not on any medication.   She was once on a bladder medication but had side effects and stopped.    She has a lot of fatigue and walks worse when more tired.   Fatigue is worse with heat.  Focus and attention have improved on Ritalin.  It also helps her fatigue some.  Update 02/26/2018: She denies any new exacerbations since her last visit.  She is on Tecfidera.  She tolerates it well.  She may have had one exacerbation at the end of last year.  A little before her 02-01-2019visit, she was experiencing more right  sided weakness.  MRI of the cervical and thoracic spine was checked.  She has multiple T2 hyperintense lesions in the spinal cord the largest to the right C2-C3 and smaller ones at C3-C4, C4, C5-C6, C6-C7 and T1-T2 all the foci were present compared to the 2016 MRI.   The MRI of the thoracic spine showed small foci adjacent to T2 and T5-6.  None of the spinal plaques appeared to be acute.      Her gait is back to baseline and she is walking without a cane.  She still gets intermittent episodes where she will feel more off balance.  She notes mild weakness and clumsiness on the right side right now.  She gets burning pain, helped by gabapentin.   Addition of Trileptal did not help further and she stopped.  She is on hydrocodone 7.5 up to 6 a day for her chronic back pain, dysesthesias and headaches.  She has not shown any drug-seeking behavior  Bladder function is variable with urgency and rare incontinence.  Vision is stable.  She continues to report fatigue and also has decreased focus and attention.  Both issues are helped by Ritalin.  She tolerates it well.   She  has heat intolerance.    Mood is doing fairly well.   She was more down in December 2018/Feb 01, 2019due to death of 2 people she knew.     She  has LBP and ESI's have helped in the past ([place]).    Update 12/11/2017:  She feels weaker on the right side.     This started 2 weeks ago with dragging of the right foot, but is much worse the past 5-6 days.    Weakness is arm, trunk and leg and she leans to the right even with sitting.   She notes she is less able to carry items in her.    Of note, she was having more pain down the arm before the symptoms started.    She notes lower neck pain but no arm pain .    MRI cervical spine in 2016 showed Foci to the right at C3, to the left at C4, to the left at C5-C6 (only seen on axial images) and medial at C6-C7. There is also a focus in the thoracic spine at T2.     Trileptal increased her headaches and she stopped after one week.   She is also on hydrocodone and gabapentin for pain.    She notes some bladder frequency but is uncertain of this has changed any. No change in bowel.  She tolerates Tecfidera well.       Update 10/30/2017:    She is on Tecfidera and tolerates it well.   She has no recent exacerbation.   She is noting more of a right foot drop.   This seems associated with LBP the past few days.   She notes some weakness in that leg.     She notes having a lumbar spine MRI last year [employer]) and was told she has spinal stenosis.  I reviewed the MRI report which we had faxed to [place]. At L3-L4 she has mild spinal stenosis and also has facet and ligamentous hypertrophy at L4-L5 and L5S1 she has moderate sized disc bulges but does not have spinal stenosis at those levels.  She had an ESI by Dr. Ethelene Hal late 2017 and had benefit until recently.    The truncal dysesthesias bother her mostly at night.  She Is on gabapentin 600 mg po bid and it makes her sleepy if she goes to higher dose.    She notes no change in her bladder and wears pads since  she has occasional incontinence.  No new visual problems.  She is sleeping ok on current med's.   She notes ritalin helps fatigue and cognitive fog/focus.    ___________________________________ From 06/19/2017: Since the last visit, she has had bronchitis and feels weaker and more fatigued.   She is having trouble Ritalin 20 mg.    MS:   She was feeling much more fatigued earlier this year.   Vit D was low (13.2) 03/17/2017.   CBC showed low WBC and slightly low neutrophils and normal lymphocytes.   She is on Tecfidera and denies exacerbation and she tolerates it well.   Last lymphocyte count was 1.1 in  April.    She notes cognitive and physical fatigue are bad and today is a worse than typical day..          Gait/Strength/sensation:   She feels walking is worse and balance is poor.    The right leg drags if she walks more than a short distance.   She also has right greater than left leg clumsiness.  She uses a cane often and has no recent falls..    She has dysesthesias in her feet and milder in her hands.    Pain is burning  and left > right up to the mid shin.    She gets a tight sensation and a poking sensation in the thoracic spine region.   Baclofen and tizanidine were not well tolerated.  PHN:   She had shingles at the sternum and down left arm in December 2016 after her knee surgery.   She now takes only 600 mg gabapentin at night as higher doses were not well tolerated.  She is also on hydrocodone up to 6/day.   Shingles occurred at time of left knee surgery  Bladder:   She has had some bladder dysfunction and has mild incontinence. She there is some hesitancy but perhaps also was not tolerated.    Vision:    She is doing about the same with occasional diplopia, worse when tired.   An early exacerbation was diplopia.    No h/o optic neuritis.   No color asymmetry.   Fatigue/sleep:   She reports fatigue is both physical and cognitive. Ritalin (brand name) has helped her fatigue more  than anything else.. The fatigue is worse in the heat.    She has some attentional deficits helped by Ritalin.  Sleep is better on gabapentin at night.   Xanax help sleep onset.   She has nocturia x 4 some night but usually falls back asleep  Mood/cognition:  She has a mild depression and mild anxiety.  She feels these issues are stable.  She had a severe depression around 2010 and was on an antidepressant but did not tolerate it well.  She also has had some anxiety helped by Xanax..   She notes a brain fog at times and  Ritalin has helped a lot and she tolerates it well.      She has some word finding problems and decreased attention.    MS History:  In 2008, she had an MRI of the brain performed which was consistent with multiple sclerosis and she was diagnosed with relapsing remitting MS. He was initially placed on Avonex for therapy. She had some exacerbations with weakness and clumsiness with her gait and she started to see Dr. Leotis Shames. He placed her on Tysabri she is on Tysabri between 2010 and 2013. 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    REVIEW OF SYSTEMS: Out of a complete 14 system review of symptoms, the patient complains only of the following symptoms, numbness, and all other reviewed systems are negative.  ALLERGIES: Allergies  Allergen Reactions  . Ibuprofen Other (See Comments)    CAUSES LYMPHEDEMA  . Nsaids Other (See Comments)    AVOIDS ANY SODIUM CONTAINING MED. -- CAUSES LYMPHEDEMA  . Other Swelling and Other (See Comments)    Laxatives- causes leg swelling artificial sweeteners    HOME MEDICATIONS: Outpatient Medications Prior to Visit  Medication Sig Dispense Refill  . ALPRAZolam (XANAX) 0.5 MG tablet TAKE 1 TABLET BY MOUTH 3 TIMES A DAY AS NEEDED FOR ANXIETY/SLEEP 90 tablet 2  . calcium-vitamin D 250-100 MG-UNIT tablet Take 1 tablet by mouth 2 (two) times daily.    .  cyanocobalamin (,VITAMIN B-12,) 1000 MCG/ML injection USE 1 IM TWICE PER MONTH 2 mL 19  . gabapentin (NEURONTIN) 600 MG tablet TAKE 1 TABLET (600 MG TOTAL) BY MOUTH 2 (TWO) TIMES DAILY. 180 tablet 3  . HYDROcodone-acetaminophen (NORCO) 7.5-325 MG tablet TAKE 2 TABLETS BY MOUTH EVERY 8 HOURS AS NEEDED  180 tablet 0  . hyoscyamine (LEVSIN SL) 0.125 MG SL tablet Place 1 tablet (0.125 mg total) under the tongue every 4 (four) hours as needed. 270 tablet 3  . Magnesium 200 MG TABS Take 200 mg by mouth daily. +malic acid    . methylphenidate (RITALIN) 10 MG tablet BRAND NAME NECESSARY.    Take six po daily in split doses 180 tablet 0  . metoprolol tartrate (LOPRESSOR) 50 MG tablet TAKE 1/2 TABLET BY MOUTH EVERY 12 HOURS AS NEEDED FOR PALPITATIONS 30 tablet 2  . Syringe/Needle, Disp, (SYRINGE 3CC/23GX1") 23G X 1" 3 ML MISC 3 Syringes by Does not apply route every 30 (thirty) days. 3 each 3  . TECFIDERA 240 MG CPDR Take one capsule by mouth twice daily 60 capsule 12  . Vitamin D, Ergocalciferol, (DRISDOL) 1.25 MG (50000 UT) CAPS capsule Take 1 capsule (50,000 Units total) by mouth every 7 (seven) days. 13 capsule 3   No facility-administered medications prior to visit.    PAST MEDICAL HISTORY: Past Medical History:  Diagnosis Date  . ADD (attention deficit disorder)   . Anxiety   . Arthritis   . Bladder incontinence    wears a pad  . Bone spur    cervical bone spurs  . Chronic fatigue   . Dysrhythmia    hx A-FIB  . 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 Headache(784.0)   . History of concussion YOUNG ADULT--  NO RESIDUAL  . IBS (irritable bowel syndrome)   . Movement disorder   . MS (multiple sclerosis) ([place])   . MVP (mitral valve prolapse) HX HEART RACING  YRS AGO   ASYMPTOMATIC SINCE THEN  . Vision abnormalities   . Weakness of right leg SECONDARY TO MS    PAST SURGICAL HISTORY: Past Surgical History:  Procedure Laterality Date  . CESAREAN SECTION  X3   BILATERAL TUBAL  LIGATION W/ LAST ONE  . DESTRUCTION OF ANAL CONDYLOMA  07-07-2005  DR TOTH  . HERNIA REPAIR    . KNEE ARTHROSCOPY WITH LATERAL MENISECTOMY  10/26/2012   Procedure: KNEE ARTHROSCOPY WITH LATERAL MENISECTOMY;  Surgeon: Johnn Hai, MD;  Location: [facility];  Service: Orthopedics;  Laterality: Left;  Left Knee Arthrsocopy with Partial Lateral Menisectomy  . LAPAROSCOPIC ASSISTED VAGINAL HYSTERECTOMY  01-15-2002  DR Delfino Lovett HOLLAND/ DR TIM DAVIS   AND REPAIR VENTRAL HERNIA  . LASER ABLATION CONDOLAMATA N/A 04/26/2013   Procedure: LASER ABLATION CONDOLAMATA;  Surgeon: Leighton Ruff, MD;  Location: [facility];  Service: General;  Laterality: N/A;  . TONSILLECTOMY    . TOTAL KNEE ARTHROPLASTY Left 11/05/2015   Procedure: LEFT TOTAL KNEE ARTHROPLASTY;  Surgeon: Susa Day, MD;  Location: [facility];  Service: Orthopedics;  Laterality: Left;    FAMILY HISTORY: Family History  Problem Relation Age of Onset  . Diabetes Mother   . Hypertension Mother   . Stroke Mother   . Bowel Disease Brother   . Diabetes Brother   . Healthy Brother   . Emphysema Father   . Heart attack Father     SOCIAL HISTORY: Social History   Socioeconomic History  . Marital status: Married    Spouse name: Not on file  . Number of children: 3  . Years of education: Not on file  . Highest education level: Not on file  Occupational History  . Not on file  Tobacco Use  . Smoking status: Never Smoker  . Smokeless tobacco: Never Used  Substance and Sexual Activity  . Alcohol use: No  . Drug use: No  . Sexual activity: Not on file  Other Topics Concern  . Not on file  Social History Narrative  . Not on file   Social Determinants of Health   Financial Resource Strain:   . Difficulty of Paying Living Expenses: Not on file  Food Insecurity:   . Worried About Programme researcher, broadcasting/film/videounning Out of Food in the Last Year: Not on file  . Ran Out of Food in the Last Year: Not on file  Transportation  Needs:   . Lack of Transportation (Medical): Not on file  . Lack of Transportation (Non-Medical): Not on file  Physical Activity:   . Days of Exercise per Week: Not on file  . Minutes of Exercise per Session: Not on file  Stress:   . Feeling of Stress : Not on file  Social Connections:   . Frequency of Communication with Friends and Family: Not on file  . Frequency of Social Gatherings with Friends and Family: Not on file  . Attends Religious Services: Not on file  . Active Member of Clubs or Organizations: Not on file  . Attends BankerClub or Organization Meetings: Not on file  . Marital Status: Not on file  Intimate Partner Violence:   . Fear of Current or Ex-Partner: Not on file  . Emotionally Abused: Not on file  . Physically Abused: Not on file  . Sexually Abused: Not on file      PHYSICAL EXAM  Vitals:   11/19/19 1531  BP: 118/74  Pulse: 60  Temp: 97.9 F (36.6 C)  TempSrc: Oral  Weight: 156 lb 9.6 oz (71 kg)  Height: 5\' 5"  (1.651 m)   Body mass index is 26.06 kg/m.  Generalized: Well developed, in no acute distress  Cardiology: normal rate and rhythm, no murmur noted Respiratory: Clear to auscultation bilaterally Neurological examination  Mentation: Alert oriented to time, place, history taking. Follows all commands speech and language fluent Cranial nerve II-XII: Pupils were equal round reactive to light. Extraocular movements were full, visual field were full on confrontational test. Facial sensation and strength were normal. Uvula tongue midline. Head turning and shoulder shrug  were normal and symmetric. Motor: The motor testing reveals 5 over 5 strength of all 4 extremities. Good symmetric motor tone is noted throughout.  Sensory: Sensory testing is intact to soft touch on all 4 extremities. No evidence of extinction is noted.  Coordination: Cerebellar testing reveals good finger-nose-finger and heel-to-shin bilaterally.  Gait and station: Gait is wide with mild  right limp, no assistive device   DIAGNOSTIC DATA (LABS, IMAGING, TESTING) - I reviewed patient records, labs, notes, testing and imaging myself where available.  No flowsheet data found.   Lab Results  Component Value Date   WBC 4.7 11/19/2019   HGB 13.3 11/19/2019   HCT 39.7 11/19/2019   MCV 89 11/19/2019   PLT 252 11/19/2019      Component Value Date/Time   NA 139 11/19/2019 1614   K 4.1 11/19/2019 1614   CL 99 11/19/2019 1614   CO2 24 11/19/2019 1614   GLUCOSE 84 11/19/2019 1614   GLUCOSE 96 06/11/2019 1145   BUN 15 11/19/2019 1614   CREATININE 0.69 11/19/2019 1614   CREATININE 0.72 06/11/2019 1145   CALCIUM 9.3 11/19/2019 1614   PROT 6.8 11/19/2019 1614   ALBUMIN 4.2 11/19/2019 1614   AST 24 11/19/2019 1614   ALT 24 11/19/2019 1614   ALKPHOS  94 11/19/2019 1614   BILITOT 0.4 11/19/2019 1614   GFRNONAA 95 11/19/2019 1614   GFRNONAA 91 06/11/2019 1145   GFRAA 109 11/19/2019 1614   GFRAA 105 06/11/2019 1145   Lab Results  Component Value Date   CHOL 174 06/11/2019   HDL 88 06/11/2019   LDLCALC 74 06/11/2019   TRIG 50 06/11/2019   CHOLHDL 2.0 06/11/2019   Lab Results  Component Value Date   HGBA1C  03/10/2010    5.5 (NOTE) The ADA recommends the following therapeutic goal for glycemic control related to Hgb A1c measurement: Goal of therapy: <6.5 Hgb A1c  Reference: American Diabetes Association: Clinical Practice Recommendations 2010, Diabetes Care, 2010, 33: (Suppl  1).   Lab Results  Component Value Date   VITAMINB12 936 11/19/2019   Lab Results  Component Value Date   TSH 1.64 06/11/2019       ASSESSMENT AND PLAN 58 y.o. year old female  has a past medical history of ADD (attention deficit disorder), Anxiety, Arthritis, Bladder incontinence, Bone spur, Chronic fatigue, Dysrhythmia, Generalized neuropathy, Headache(784.0), History of concussion (YOUNG ADULT--  NO RESIDUAL), IBS (irritable bowel syndrome), Movement disorder, MS (multiple sclerosis)  (HCC), MVP (mitral valve prolapse) (HX HEART RACING  YRS AGO), Vision abnormalities, and Weakness of right leg (SECONDARY TO MS). here with     ICD-10-CM   1. Multiple sclerosis (HCC)  G35 Vitamin D, 25-hydroxy    Vitamin B12    CMP    CBC (no diff)  2. Chronic fatigue  R53.82   3. 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 Dysesthesia  R20.8     Overall Pinky is doing very well with Tecfidera as prescribed.  We will continue current treatment regimen.  We will assist her with scheduling MRI as ordered by Dr. Epimenio FootSater in August.  I will also update labs today.  She will continue activity as tolerated.  Infection precautions advised.  I have signed her handicap placard application in the office today.  She will follow-up with Dr. Purvis SheffieldSager in 6 months, sooner if needed.  She verbalizes understanding and agreement with this plan.   Orders Placed This Encounter  Procedures  . Vitamin D, 25-hydroxy  . Vitamin B12  . CMP  . CBC (no diff)     No orders of the defined types were placed in this encounter.     I spent 15 minutes with the patient. 50% of this time was spent counseling and educating patient on plan of care and medications.    Shawnie Dappermy Zarif Rathje, FNP-C 11/20/2019, 9:57 AM [facility] [address], [address] [place], [place] [address] [phone]

## 2019-11-19 NOTE — Patient Instructions (Signed)
Continue current treatment plan.   We will get your scheduled for MRI  Follow up with Dr Felecia Shelling in 6 months   Multiple Sclerosis Multiple sclerosis (MS) is a disease of the brain, spinal cord, and optic nerves (central nervous system). It causes the body's disease-fighting (immune) system to destroy the protective covering (myelin sheath) around nerves in the brain. When this happens, signals (nerve impulses) going to and from the brain and spinal cord do not get sent properly or may not get sent at all. There are several types of MS:  Relapsing-remitting MS. This is the most common type. This causes sudden attacks of symptoms. After an attack, you may recover completely until the next attack, or some symptoms may remain permanently.  Secondary progressive MS. This usually develops after the onset of relapsing-remitting MS. Similar to relapsing-remitting MS, this type also causes sudden attacks of symptoms. Attacks may be less frequent, but symptoms slowly get worse (progress) over time.  Primary progressive MS. This causes symptoms that steadily progress over time. This type of MS does not cause sudden attacks of symptoms. The age of onset of MS varies, but it often develops between 55-94 years of age. MS is a lifelong (chronic) condition. There is no cure, but treatment can help slow down the progression of the disease. What are the causes? The cause of this condition is not known. What increases the risk? You are more likely to develop this condition if:  You are a woman.  You have a relative with MS. However, the condition is not passed from parent to child (inherited).  You have a lack (deficiency) of vitamin D.  You smoke. MS is more common in the [place] than in the [place]. What are the signs or symptoms? Relapsing-remitting and secondary progressive MS cause symptoms to occur in episodes or attacks that may last weeks to months. There may be long  periods between attacks in which there are almost no symptoms. Primary progressive MS causes symptoms to steadily progress after they develop. Symptoms of MS vary because of the many different ways it affects the central nervous system. The main symptoms include:  Vision problems and eye pain.  Numbness.  Weakness.  Inability to move your arms, hands, feet, or legs (paralysis).  Balance problems.  Shaking that you cannot control (tremors).  Muscle spasms.  Problems with thinking (cognitive changes). MS can also cause symptoms that are associated with the disease, but are not always the direct result of an MS attack. They may include:  Inability to control urination or bowel movements (incontinence).  Headaches.  Fatigue.  Inability to tolerate heat.  Emotional changes.  Depression.  Pain. How is this diagnosed? This condition is diagnosed based on:  Your symptoms.  A neurological exam. This involves checking central nervous system function, such as nerve function, reflexes, and coordination.  MRIs of the brain and spinal cord.  Lab tests, including a lumbar puncture that tests the fluid that surrounds the brain and spinal cord (cerebrospinal fluid).  Tests to measure the electrical activity of the brain in response to stimulation (evoked potentials). How is this treated? There is no cure for MS, but medicines can help decrease the number and frequency of attacks and help relieve nuisance symptoms. Treatment options may include:  Medicines that reduce the frequency of attacks. These medicines may be given by injection, by mouth (orally), or through an IV.  Medicines that reduce inflammation (steroids). These may provide short-term relief of  symptoms.  Medicines to help control pain, depression, fatigue, or incontinence.  Vitamin D, if you have a deficiency.  Using devices to help you move around (assistive devices), such as braces, a cane, or a  walker.  Physical therapy to strengthen and stretch your muscles.  Occupational therapy to help you with everyday tasks.  Alternative or complementary treatments such as exercise, massage, or acupuncture. Follow these instructions at home:  Take over-the-counter and prescription medicines only as told by your health care provider.  Do not drive or use heavy machinery while taking prescription pain medicine.  Use assistive devices as recommended by your physical therapist or your health care provider.  Exercise as directed by your health care provider.  Return to your normal activities as told by your health care provider. Ask your health care provider what activities are safe for you.  Reach out for support. Share your feelings with friends, family, or a support group.  Keep all follow-up visits as told by your health care provider and therapists. This is important. Where to find more information  National Multiple Sclerosis Society: https://www.nationalmssociety.org Contact a health care provider if:  You feel depressed.  You develop new pain or numbness.  You have tremors.  You have problems with sexual function. Get help right away if:  You develop paralysis.  You develop numbness.  You have problems with your bladder or bowel function.  You develop double vision.  You lose vision in one or both eyes.  You develop suicidal thoughts.  You develop severe confusion. If you ever feel like you may hurt yourself or others, or have thoughts about taking your own life, get help right away. You can go to your nearest emergency department or call:  Your local emergency services (911 in the [place]  A suicide crisis helpline, such as the National Suicide Prevention Lifeline at 6570781807. This is open 24 hours a day. Summary  Multiple sclerosis (MS) is a disease of the central nervous system that causes the body's immune system to destroy the protective covering  (myelin sheath) around nerves in the brain.  There are 3 types of MS: relapsing-remitting, secondary progressive, and primary progressive. Relapsing-remitting and secondary progressive MS cause symptoms to occur in episodes or attacks that may last weeks to months. Primary progressive MS causes symptoms to steadily progress after they develop.  There is no cure for MS, but medicines can help decrease the number and frequency of attacks and help relieve nuisance symptoms. Treatment may also include physical or occupational therapy.  If you develop numbness, paralysis, vision problems, or other neurological symptoms, get help right away. This information is not intended to replace advice given to you by your health care provider. Make sure you discuss any questions you have with your health care provider. Document Released: 11/18/2000 Document Revised: 11/03/2017 Document Reviewed: 01/30/2017 Elsevier Patient Education  2020 [employer].

## 2019-11-20 ENCOUNTER — Telehealth: Payer: Self-pay | Admitting: *Deleted

## 2019-11-20 ENCOUNTER — Encounter: Payer: Self-pay | Admitting: Family Medicine

## 2019-11-20 LAB — CBC
Hematocrit: 39.7 % (ref 34.0–46.6)
Hemoglobin: 13.3 g/dL (ref 11.1–15.9)
MCH: 29.8 pg (ref 26.6–33.0)
MCHC: 33.5 g/dL (ref 31.5–35.7)
MCV: 89 fL (ref 79–97)
Platelets: 252 10*3/uL (ref 150–450)
RBC: 4.46 x10E6/uL (ref 3.77–5.28)
RDW: 12.2 % (ref 11.7–15.4)
WBC: 4.7 10*3/uL (ref 3.4–10.8)

## 2019-11-20 LAB — COMPREHENSIVE METABOLIC PANEL
ALT: 24 IU/L (ref 0–32)
AST: 24 IU/L (ref 0–40)
Albumin/Globulin Ratio: 1.6 (ref 1.2–2.2)
Albumin: 4.2 g/dL (ref 3.8–4.9)
Alkaline Phosphatase: 94 IU/L (ref 39–117)
BUN/Creatinine Ratio: 22 (ref 12–28)
BUN: 15 mg/dL (ref 8–27)
Bilirubin Total: 0.4 mg/dL (ref 0.0–1.2)
CO2: 24 mmol/L (ref 20–29)
Calcium: 9.3 mg/dL (ref 8.7–10.3)
Chloride: 99 mmol/L (ref 96–106)
Creatinine, Ser: 0.69 mg/dL (ref 0.57–1.00)
GFR calc Af Amer: 109 mL/min/{1.73_m2} (ref >59)
GFR calc non Af Amer: 95 mL/min/{1.73_m2} (ref >59)
Globulin, Total: 2.6 g/dL (ref 1.5–4.5)
Glucose: 84 mg/dL (ref 65–99)
Potassium: 4.1 mmol/L (ref 3.5–5.2)
Sodium: 139 mmol/L (ref 134–144)
Total Protein: 6.8 g/dL (ref 6.0–8.5)

## 2019-11-20 LAB — VITAMIN D 25 HYDROXY (VIT D DEFICIENCY, FRACTURES): Vit D, 25-Hydroxy: 34.3 ng/mL (ref 30.0–100.0)

## 2019-11-20 LAB — VITAMIN B12: Vitamin B-12: 936 pg/mL (ref 232–1245)

## 2019-11-20 NOTE — Telephone Encounter (Signed)
Spoke with patient and informed her that her labs look great. Unfortunately, we did not order the differential yesterday. Per Ellison Hughs with [facility] this can be added on. I advised she'll get a call with those results later. Patient verbalized understanding, appreciation.

## 2019-11-20 NOTE — Progress Notes (Signed)
I have read the note, and I agree with the clinical assessment and plan.  Richard A. [name], MD, PhD, FAAN Certified in Neurology, Clinical Neurophysiology, Sleep Medicine, Pain Medicine and Neuroimaging  [facility] [address], [address] [place], [place] [address] [phone]  

## 2019-11-21 ENCOUNTER — Telehealth: Payer: Self-pay | Admitting: *Deleted

## 2019-11-21 ENCOUNTER — Telehealth: Payer: Self-pay | Admitting: Family Medicine

## 2019-11-21 LAB — SPECIMEN STATUS REPORT

## 2019-11-21 NOTE — Telephone Encounter (Signed)
Relayed that CBC wth diff results looked good, normal.  She verbalized understanding. Has appt in 05/2019.

## 2019-11-21 NOTE — Telephone Encounter (Signed)
Can you help her schedule MRI ordered by Dr Felecia Shelling in August please? TY!

## 2019-11-21 NOTE — Telephone Encounter (Signed)
-----   Message from Debbora Presto, NP sent at 11/21/2019  7:58 AM EST ----- Please let her know that her CBC looks great. We will see her back in 6 months.

## 2019-11-22 LAB — CBC WITH DIFFERENTIAL/PLATELET
Basophils Absolute: 0 10*3/uL (ref 0.0–0.2)
Basos: 1 %
EOS (ABSOLUTE): 0 10*3/uL (ref 0.0–0.4)
Eos: 0 %
Hematocrit: 41.7 % (ref 34.0–46.6)
Hemoglobin: 13.4 g/dL (ref 11.1–15.9)
Immature Grans (Abs): 0 10*3/uL (ref 0.0–0.1)
Immature Granulocytes: 0 %
Lymphocytes Absolute: 1.7 10*3/uL (ref 0.7–3.1)
Lymphs: 35 %
MCH: 30.2 pg (ref 26.6–33.0)
MCHC: 32.1 g/dL (ref 31.5–35.7)
MCV: 94 fL (ref 79–97)
Monocytes Absolute: 0.5 10*3/uL (ref 0.1–0.9)
Monocytes: 10 %
Neutrophils Absolute: 2.6 10*3/uL (ref 1.4–7.0)
Neutrophils: 54 %
Platelets: 287 10*3/uL (ref 150–450)
RBC: 4.43 x10E6/uL (ref 3.77–5.28)
RDW: 13.6 % (ref 11.7–15.4)
WBC: 4.9 10*3/uL (ref 3.4–10.8)

## 2019-11-22 LAB — SPECIMEN STATUS REPORT

## 2019-11-26 ENCOUNTER — Other Ambulatory Visit: Payer: Self-pay

## 2019-11-26 MED ORDER — HYDROCODONE-ACETAMINOPHEN 7.5-325 MG PO TABS: ORAL_TABLET | ORAL | 0 refills | Status: DC

## 2019-11-26 MED ORDER — RITALIN 10 MG PO TABS: ORAL_TABLET | ORAL | 0 refills | Status: DC

## 2019-11-26 NOTE — Telephone Encounter (Signed)
Checked drug registry. She last refilled hydrocodone 11/03/19 #180. Last refilled methylphenidate 11/03/19 #180. Last seen 11/19/2019 and next f/u 05/19/2020

## 2019-12-03 ENCOUNTER — Telehealth: Payer: Self-pay | Admitting: *Deleted

## 2019-12-03 NOTE — Telephone Encounter (Signed)
error 

## 2019-12-04 NOTE — Telephone Encounter (Signed)
Received fax from [employer] concerning that [place] will not be covered by her insurance as of 12-06-2019.  Per AL/NP BN tecfidera is medically necessary and to continue.   This was faxed to [phone] (fax confirmation received). [phone].

## 2019-12-09 NOTE — Telephone Encounter (Signed)
Irving Burton spoke to patient in august . 07/31/19 spoke to the patient she is going to look into some stuff with her insurance and then get back to me EE  07/31/19 [employer] auth: [employer] via [employer]  I will call patient and work on her approval

## 2019-12-10 NOTE — Telephone Encounter (Signed)
Noted  

## 2019-12-10 NOTE — Telephone Encounter (Signed)
I called to scheduled the patient, she is going to hold off on scheduling due to financial reasons. DWD

## 2019-12-28 ENCOUNTER — Other Ambulatory Visit: Payer: Self-pay | Admitting: Internal Medicine

## 2019-12-28 ENCOUNTER — Other Ambulatory Visit: Payer: Self-pay | Admitting: Neurology

## 2019-12-30 ENCOUNTER — Other Ambulatory Visit: Payer: Self-pay | Admitting: Neurology

## 2019-12-30 MED ORDER — HYDROCODONE-ACETAMINOPHEN 7.5-325 MG PO TABS: ORAL_TABLET | ORAL | 0 refills | Status: DC

## 2019-12-30 MED ORDER — RITALIN 10 MG PO TABS: ORAL_TABLET | ORAL | 0 refills | Status: DC

## 2019-12-31 MED ORDER — RITALIN 10 MG PO TABS: ORAL_TABLET | ORAL | 0 refills | Status: DC

## 2019-12-31 MED ORDER — HYDROCODONE-ACETAMINOPHEN 7.5-325 MG PO TABS: ORAL_TABLET | ORAL | 0 refills | Status: DC

## 2020-01-01 NOTE — Telephone Encounter (Signed)
Richard, this is your patient. Any reason why I should take this prescription over? CD

## 2020-01-27 ENCOUNTER — Other Ambulatory Visit: Payer: Self-pay

## 2020-01-27 MED ORDER — RITALIN 10 MG PO TABS: ORAL_TABLET | ORAL | 0 refills | Status: DC

## 2020-01-27 MED ORDER — HYDROCODONE-ACETAMINOPHEN 7.5-325 MG PO TABS: ORAL_TABLET | ORAL | 0 refills | Status: DC

## 2020-01-27 NOTE — Telephone Encounter (Signed)
[facility] Verified LR: 12-31-2019  Qty: 30  Pending appointment: 05-19-2020

## 2020-02-04 ENCOUNTER — Telehealth: Payer: Self-pay | Admitting: Internal Medicine

## 2020-02-04 NOTE — Telephone Encounter (Signed)
Appointment scheduled.

## 2020-02-04 NOTE — Telephone Encounter (Signed)
Needs OV last  Depomedrol  injectionon file was Summer 2020 I think. Can be seen later this week.

## 2020-02-04 NOTE — Telephone Encounter (Signed)
[name] [phone]  [name] called to say that she thinks she may need an injection, she is feeling weak and tired, hard to get around, thinks her MS is acting up. Patient does not remember last time she had injection, however she does not feel its bad enough to go get an infusion. No fever, No COVID exposure

## 2020-02-06 ENCOUNTER — Encounter: Payer: Self-pay | Admitting: Internal Medicine

## 2020-02-06 ENCOUNTER — Other Ambulatory Visit: Payer: Self-pay

## 2020-02-06 ENCOUNTER — Ambulatory Visit: Payer: 59 | Admitting: Internal Medicine

## 2020-02-06 VITALS — BP 120/90 | HR 64 | Temp 97.9°F | Ht 65.0 in | Wt 155.0 lb

## 2020-02-06 DIAGNOSIS — G35 Multiple sclerosis: Secondary | ICD-10-CM | POA: Diagnosis not present

## 2020-02-06 DIAGNOSIS — R5383 Other fatigue: Secondary | ICD-10-CM | POA: Diagnosis not present

## 2020-02-06 LAB — TSH: TSH: 1.72 mIU/L (ref 0.40–4.50)

## 2020-02-06 MED ORDER — METHYLPREDNISOLONE ACETATE 80 MG/ML IJ SUSP
80.0000 mg | Freq: Once | INTRAMUSCULAR | Status: AC
  Administered 2020-02-06: 80 mg via INTRAMUSCULAR

## 2020-02-06 NOTE — Progress Notes (Signed)
   Subjective:    Patient ID: [name], female    DOB: 09/07/1961, 59 y.o.   MRN: [id number]  HPI 59 year old Female with MS. She had labs December 2020 with normal B12 and Vitamin D levels. Had normal TSH in July 2020. TSH checked on March 4 is normal. Now has considerable fatigue. Cannot be more specific.   Dr. Felecia Shelling is her Neurologist.  Has been treated with Ritalin and high-dose vitamin D both of which should help fatigue.  Takes B12 injections IM twice monthly.  MS was diagnosed in 2008.   Husband recently hospitalized with chest pain.  CBC and C-met drawn today are both WNL.    Review of Systems extreme fatigue     Objective:   Physical Exam Blood pressure 120/90 pulse 64 temperature 97.9 degrees orally pulse oximetry 97% weight 155 pounds.  BMI 25.79.  Skin warm and dry.  No cervical adenopathy.  No thyromegaly.  Chest clear to auscultation.  Cardiac exam regular rate and rhythm normal S1 and S2.  Extremities without edema.  Affect seems essentially normal.       Assessment & Plan:  Fatigue-?  MS related.  Situational stress seems to be improved recently particularly with her son.  However husband was recently hospitalized with chest pain.  ?  Depression-discussed and does not want to be on antidepressant medication  Plan: She usually responds well to an injection of methylprednisolone 80 mg IM one-time dose.  She was given this today and will follow up with her Neurologist, Dr. Rachel Moulds who is done an excellent job managing her multiple sclerosis.

## 2020-02-13 ENCOUNTER — Other Ambulatory Visit: Payer: Self-pay | Admitting: Neurology

## 2020-02-19 ENCOUNTER — Telehealth: Payer: Self-pay | Admitting: Family Medicine

## 2020-02-19 NOTE — Telephone Encounter (Signed)
Pt called stating she is having some issues with her MS and is unsure what she should do. She would like to speak to RN and schedule her MRI

## 2020-02-20 NOTE — Telephone Encounter (Signed)
Called the patient to discuss further, she states that she has recently visit opthomologist and that went well. She states that she has not been under any stress recently. She feels this is very similar to exacerbation that she has in 2018. Patient did request to come in and Dr Epimenio Foot had an opening on Monday. Patient accepted that apt. Advised that someone will be in contact to get her mri scheduled. Patient was appreciative.

## 2020-02-20 NOTE — Telephone Encounter (Signed)
Please let her know that we will get her set up for repeat MRI. It sounds like symptoms have been somewhat gradual. I am reassured that there is no acute change in numbness or weakness, specifically on one side of the body or the other. I am concerned about double vision. I would like for her to see ophthalmology as soon as she can if she hasn't already. We can assess for any changes or exacerbations with MRI. Many things can contribute to increased fatigue. Remind her to stay well hydrated. If stress levels are higher, this can cause worsening symptoms. Try to get adequate sleep. I am happy to see her back in the office for a recheck of physical exam if concerns continue.

## 2020-02-20 NOTE — Telephone Encounter (Signed)
Called the patient back. She states gradually over the last few weeks she has noticed a decline that has continued to gradually worsen. She states that her fatigue and weakness is so bad it is hard for her to get up out of the bed. She complains of double vision. See recent office notes from Dr Lenord Fellers where the patient was given a solumedrol injection which has not helped. The patient inquired about the MRI's that were ordered in august. Advised I am unsure if those orders will still be effective but that I would send this information to Amy for her to review. Advised our MRI scheduler will reach out to Amy to advise if the current order is still good or if a new order must be placed. Informed that someone should be in contact soon to discuss getting scheduled for that. Advised I would also make Amy aware of her exacerbation and see if she has any recommendations for the patient. Pt verbalized understanding.

## 2020-02-24 ENCOUNTER — Ambulatory Visit: Payer: 59 | Admitting: Neurology

## 2020-02-24 ENCOUNTER — Other Ambulatory Visit: Payer: Self-pay

## 2020-02-24 ENCOUNTER — Encounter: Payer: Self-pay | Admitting: Neurology

## 2020-02-24 VITALS — BP 143/85 | HR 66 | Temp 97.3°F | Ht 65.0 in | Wt 158.0 lb

## 2020-02-24 DIAGNOSIS — F988 Other specified behavioral and emotional disorders with onset usually occurring in childhood and adolescence: Secondary | ICD-10-CM

## 2020-02-24 DIAGNOSIS — G8929 Other chronic pain: Secondary | ICD-10-CM

## 2020-02-24 DIAGNOSIS — M545 Low back pain, unspecified: Secondary | ICD-10-CM

## 2020-02-24 DIAGNOSIS — R269 Unspecified abnormalities of gait and mobility: Secondary | ICD-10-CM | POA: Diagnosis not present

## 2020-02-24 DIAGNOSIS — G35 Multiple sclerosis: Secondary | ICD-10-CM | POA: Diagnosis not present

## 2020-02-24 DIAGNOSIS — Z79899 Other long term (current) drug therapy: Secondary | ICD-10-CM

## 2020-02-24 MED ORDER — RITALIN 10 MG PO TABS: ORAL_TABLET | ORAL | 0 refills | Status: DC

## 2020-02-24 MED ORDER — AMANTADINE HCL 100 MG PO CAPS: 100.0000 mg | ORAL_CAPSULE | Freq: Two times a day (BID) | ORAL | 5 refills | Status: DC

## 2020-02-24 MED ORDER — HYDROCODONE-ACETAMINOPHEN 7.5-325 MG PO TABS: ORAL_TABLET | ORAL | 0 refills | Status: DC

## 2020-02-24 NOTE — Progress Notes (Signed)
[facility]  PATIENT: [name] DOB: 1961/02/28 REASON FOR VISIT: Multiple sclerosis   HISTORICAL  CHIEF COMPLAINT:  Chief Complaint  Patient presents with  . Follow-up    RM 12, alone. Last seen 11/14/2019  . Multiple Sclerosis    On Tecfidera     HISTORY OF PRESENT ILLNESS:  [name] is a 59 y.o. woman with relapsing remitting multiple sclerosis diagnosed in 2008.    Update 02/24/2020: She is on Tecfidera and tolerates it well.   She feels more clumsy and much more fatigued.   She felt better on Tysabri but was JCV Ab positive.     She feels gait is slightly worse with more stumbles.   Right leg weaker than the left, especially with hip flexion.   She tires out very easily.   She wakes up to urinate 4 times a night .    She usually falls back asleep easily.   She denies snoring.   She feels a little more depressed.  She reports that Pristiq had not helped her much in the past  She had PT for a knee replacement 2016 and notes that her right hip was weak and they worked on that.    She is on ritalin and it helps her fatigue and attention/focus some.   In the past, Provigil helped fatigue as well.    She had not felt better with Solu-Medrol in January 2019 (had a possible small exacerbation) though another time, she thinks she did feel better  She also has pain in her back.   Hydrocodone has helped (she takes 6 x 7.5 mg daily).   When pain flares, she feels her walking does worse.     Vit D has been low but was 34.3 when last checked.      Update 07/30/2019: Feels she has been mostly stable since her last virtual visit.  She is on Tecfidera as her disease modifying therapy for MS.  Last lymphocytes in July were 1.1.   She does not note any recent exacerbations or significant new neurologic symptoms.   Gait is doing about the same as last year.   She has some arthritic changes also affecting gait some.      She does note more urinary urgency and  frequency.   She wears Poise.  She denies burning.   Fatigue is doing ok.  She is sleeping ok but feels she could use a little more.  Ritalin helps the fatigue and alertness/attention  She is taking B12 and D vitamins.   Vit D was 24 ng/mL (> 30 normal) and is now taking the 50000 U weekly.    Update 03/26/2019 (virtual) She feels her MS is mostly stable though she felt worse for a few weeks.   She reported feeling sick in March with an URI and a fever.   She was placed on a Z-pak and URi symptoms improved.  She feels her gait is a little worse but she notes her recent infection associated with more fatigue and also she has knee pain.     She has some numbness and tingling in her legs.   She has some urge incontinence and uses Poise pads.   Vision is stable but she noted diplopia with   [name] has been a problem for a while but is always wore for a couple weeks after any infection.   Ritalin has helped fatigue and also helps her focus/attention.     Vitamin D  is low and we discussed a higher supplement.      Update 11/15/2018: She is on Tecfidera and she tolerates it well.   No exacerbationShe has had an URI the past week and felt more fatigued.   She was placed on Tamiflu, Z-pak and prednisone.    She always feels weaker and more tired when she has an infection.   She has a torn meniscus on the right and gait is worse.   Strength and sensation are unchanged.   She needs to wear Poise Pads for her bladder due to leakage.     Ritalin helps her focus and attention and fatigue.  She is sleeping well most night.    Gabapentin helps the leg and torso pain at night and also helps her sleep.   Mood is doing ok -- worried about her 68 yo son in recent motorcycle accident requiring multiple orthopedic operations.    Recent labs show normal lymphocyte count, low vit D (16) and normal CMP and TSH.    Update 07/02/2018: She feels her MS is slightly worse, with m,ore gait and bladder issues.   She is on  Tecfidera and she tolerates it well.     Her gait is mildly worse and she falls at last once a month but stumbles frequently.   Outside her house is not flat.   She has a right foot drop.  She also has right knee pain (has had surgery for torn meniscus).   She also has lower bak pain and in the past was told she  Had lumbar spinal stenosis.   Dr. Ethelene Hal has done [facility] in the past (2016 and 2017).   She has dysesthesias and pain in her thoracic spine.    She never took the oxcarbazepine.    Her bladder is doing worse.   She now wears pads all the time.  She has incontinence almost daily.    She has no hesitancy and feels that she empties.     She is not on any medication.   She was once on a bladder medication but had side effects and stopped.    She has a lot of fatigue and walks worse when more tired.   Fatigue is worse with heat.  Focus and attention have improved on Ritalin.  It also helps her fatigue some.  Update 02/26/2018: She denies any new exacerbations since her last visit.  She is on Tecfidera.  She tolerates it well.  She may have had one exacerbation at the end of last year.  A little before her January 2019 visit, she was experiencing more right  sided weakness.  MRI of the cervical and thoracic spine was checked.  She has multiple T2 hyperintense lesions in the spinal cord the largest to the right C2-C3 and smaller ones at C3-C4, C4, C5-C6, C6-C7 and T1-T2 all the foci were present compared to the 2016 MRI.   The MRI of the thoracic spine showed small foci adjacent to T2 and T5-6.  None of the spinal plaques appeared to be acute.      Her gait is back to baseline and she is walking without a cane.  She still gets intermittent episodes where she will feel more off balance.  She notes mild weakness and clumsiness on the right side right now.  She gets burning pain, helped by gabapentin.   Addition of Trileptal did not help further and she stopped.  She is on hydrocodone 7.5 up to 6 a  day for her  chronic back pain, dysesthesias and headaches.  She has not shown any drug-seeking behavior  Bladder function is variable with urgency and rare incontinence.  Vision is stable.  She continues to report fatigue and also has decreased focus and attention.  Both issues are helped by Ritalin.  She tolerates it well.   She has heat intolerance.    Mood is doing fairly well.   She was more down in December 2018/Jan 2019 due to death of 2 people she knew.     She has LBP and ESI's have helped in the past ([place]).    Update 12/11/2017:  She feels weaker on the right side.     This started 2 weeks ago with dragging of the right foot, but is much worse the past 5-6 days.    Weakness is arm, trunk and leg and she leans to the right even with sitting.   She notes she is less able to carry items in her.    Of note, she was having more pain down the arm before the symptoms started.    She notes lower neck pain but no arm pain .    MRI cervical spine in 2016 showed Foci to the right at C3, to the left at C4, to the left at C5-C6 (only seen on axial images) and medial at C6-C7. There is also a focus in the thoracic spine at T2.     Trileptal increased her headaches and she stopped after one week.   She is also on hydrocodone and gabapentin for pain.    She notes some bladder frequency but is uncertain of this has changed any. No change in bowel.  She tolerates Tecfidera well.       Update 10/30/2017:    She is on Tecfidera and tolerates it well.   She has no recent exacerbation.   She is noting more of a right foot drop.   This seems associated with LBP the past few days.   She notes some weakness in that leg.     She notes having a lumbar spine MRI last year Microbiologist) and was told she has spinal stenosis.  I reviewed the MRI report which we had faxed to [place]. At L3-L4 she has mild spinal stenosis and also has facet and ligamentous hypertrophy at L4-L5 and L5S1 she has moderate sized disc bulges but does  not have spinal stenosis at those levels.  She had an ESI by Dr. Nelva Bush late 2017 and had benefit until recently.    The truncal dysesthesias bother her mostly at night.  She Is on gabapentin 600 mg po bid and it makes her sleepy if she goes to higher dose.    She notes no change in her bladder and wears pads since she has occasional incontinence.  No new visual problems.  She is sleeping ok on current med's.   She notes ritalin helps fatigue and cognitive fog/focus.    ___________________________________ From 06/19/2017: Since the last visit, she has had bronchitis and feels weaker and more fatigued.   She is having trouble Ritalin 20 mg.    MS:   She was feeling much more fatigued earlier this year.   Vit D was low (13.2) 03/17/2017.   CBC showed low WBC and slightly low neutrophils and normal lymphocytes.   She is on Tecfidera and denies exacerbation and she tolerates it well.   Last lymphocyte count was 1.1 in  April.  She notes cognitive and physical fatigue are bad and today is a worse than typical day..          Gait/Strength/sensation:   She feels walking is worse and balance is poor.    The right leg drags if she walks more than a short distance.   She also has right greater than left leg clumsiness.  She uses a cane often and has no recent falls..    She has dysesthesias in her feet and milder in her hands.    Pain is burning and left > right up to the mid shin.    She gets a tight sensation and a poking sensation in the thoracic spine region.   Baclofen and tizanidine were not well tolerated.  PHN:   She had shingles at the sternum and down left arm in December 2016 after her knee surgery.   She now takes only 600 mg gabapentin at night as higher doses were not well tolerated.  She is also on hydrocodone up to 6/day.   Shingles occurred at time of left knee surgery  Bladder:   She has had some bladder dysfunction and has mild incontinence. She there is some hesitancy but perhaps also was  not tolerated.    Vision:    She is doing about the same with occasional diplopia, worse when tired.   An early exacerbation was diplopia.    No h/o optic neuritis.   No color asymmetry.   Fatigue/sleep:   She reports fatigue is both physical and cognitive. Ritalin (brand name) has helped her fatigue more than anything else.. The fatigue is worse in the heat.    She has some attentional deficits helped by Ritalin.  Sleep is better on gabapentin at night.   Xanax help sleep onset.   She has nocturia x 4 some night but usually falls back asleep  Mood/cognition:  She has a mild depression and mild anxiety.  She feels these issues are stable.  She had a severe depression around 2010 and was on an antidepressant but did not tolerate it well.  She also has had some anxiety helped by Xanax..   She notes a brain fog at times and  Ritalin has helped a lot and she tolerates it well.      She has some word finding problems and decreased attention.    MS History:  In 2008, she had an MRI of the brain performed which was consistent with multiple sclerosis and she was diagnosed with relapsing remitting MS. He was initially placed on Avonex for therapy. She had some exacerbations with weakness and clumsiness with her gait and she started to see Dr. Leotis Shames. He placed her on Tysabri she is on Tysabri between 2010 and 2013. 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      REVIEW OF SYSTEMS:  Constitutional: No fevers, chills, sweats, or change in appetite.  She notes fatigue Eyes: No visual changes,eye pain.   She has intermittent diplopia Ear, nose and throat: No hearing loss, ear pain, nasal congestion, sore throat Cardiovascular: No chest pain, palpitations Respiratory:  No shortness of breath at rest or with exertion.   No wheezes GastrointestinaI: No nausea, vomiting, diarrhea, abdominal pain, fecal  incontinence Genitourinary:  a sabove Musculoskeletal:  she has both neck pain and back pain. Also bilat knee pain Integumentary: No rash, pruritus, skin lesions.  She has Raynauds in her hands  Neurological: as above Psychiatric: No depression at this time.  Anxiety doing ok on med's Endocrine: No palpitations, diaphoresis, change in appetite, change in weigh or increased thirst Hematologic/Lymphatic:  No anemia, purpura, petechiae. Allergic/Immunologic: No itchy/runny eyes, nasal congestion, recent allergic reactions, rashes  ALLERGIES: Allergies  Allergen Reactions  . Ibuprofen Other (See Comments)    CAUSES LYMPHEDEMA  . Nsaids Other (See Comments)    AVOIDS ANY SODIUM CONTAINING MED. -- CAUSES LYMPHEDEMA  . Other Swelling and Other (See Comments)    Laxatives- causes leg swelling artificial sweeteners    HOME MEDICATIONS: Outpatient Medications Prior to Visit  Medication Sig Dispense Refill  . ALPRAZolam (XANAX) 0.5 MG tablet TAKE 1 TABLET BY MOUTH 3 TIMES A DAY AS NEEDED FOR ANXIETY/SLEEP 90 tablet 2  . calcium-vitamin D 250-100 MG-UNIT tablet Take 1 tablet by mouth 2 (two) times daily.    . cyanocobalamin (,VITAMIN B-12,) 1000 MCG/ML injection INJECT 1 ML TWICE PER MONTH 2 mL 11  . gabapentin (NEURONTIN) 600 MG tablet TAKE 1 TABLET (600 MG TOTAL) BY MOUTH 2 (TWO) TIMES DAILY. 180 tablet 3  . hyoscyamine (LEVSIN SL) 0.125 MG SL tablet Place 1 tablet (0.125 mg total) under the tongue every 4 (four) hours as needed. 270 tablet 3  . Magnesium 200 MG TABS Take 200 mg by mouth daily. +malic acid    . metoprolol tartrate (LOPRESSOR) 50 MG tablet TAKE 1/2 TABLET BY MOUTH EVERY 12 HOURS AS NEEDED FOR PALPITATIONS 90 tablet 2  . Syringe/Needle, Disp, (SYRINGE 3CC/23GX1") 23G X 1" 3 ML MISC 3 Syringes by Does not apply route every 30 (thirty) days. (Patient taking differently: 3 Syringes by Does not apply route every 30 (thirty) days. 26Gx5/6") 3 each 3  . TECFIDERA 240 MG CPDR Take one  capsule by mouth twice daily 60 capsule 12  . Vitamin D, Ergocalciferol, (DRISDOL) 1.25 MG (50000 UT) CAPS capsule Take 1 capsule (50,000 Units total) by mouth every 7 (seven) days. 13 capsule 3  . HYDROcodone-acetaminophen (NORCO) 7.5-325 MG tablet TAKE 2 TABLETS BY MOUTH EVERY 8 HOURS AS NEEDED 180 tablet 0  . RITALIN 10 MG tablet BRAND NAME NECESSARY.    Take six po daily in split doses 180 tablet 0   No facility-administered medications prior to visit.    PAST MEDICAL HISTORY: Past Medical History:  Diagnosis Date  . ADD (attention deficit disorder)   . Anxiety   . Arthritis   . Bladder incontinence    wears a pad  . Bone spur    cervical bone spurs  . Chronic fatigue   . Dysrhythmia    hx A-FIB  . 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 Headache(784.0)   . History of concussion YOUNG ADULT--  NO RESIDUAL  . IBS (irritable bowel syndrome)   . Movement disorder   . MS (multiple sclerosis) (HCC)   . MVP (mitral valve prolapse) HX HEART RACING  YRS AGO   ASYMPTOMATIC SINCE THEN  . Vision abnormalities   . Weakness of right leg SECONDARY TO MS    PAST SURGICAL HISTORY: Past Surgical History:  Procedure Laterality Date  . CESAREAN SECTION  X3   BILATERAL TUBAL LIGATION W/ LAST ONE  . DESTRUCTION OF ANAL CONDYLOMA  07-07-2005  DR TOTH  . HERNIA REPAIR    . KNEE ARTHROSCOPY WITH LATERAL MENISECTOMY  10/26/2012   Procedure: KNEE ARTHROSCOPY WITH LATERAL MENISECTOMY;  Surgeon: Javier Docker, MD;  Location: [facility];  Service: Orthopedics;  Laterality: Left;  Left Knee Arthrsocopy with Partial Lateral Menisectomy  . LAPAROSCOPIC ASSISTED VAGINAL HYSTERECTOMY  01-15-2002  DR Gerlene Burdock HOLLAND/ DR TIM DAVIS   AND REPAIR VENTRAL HERNIA  . LASER ABLATION CONDOLAMATA N/A 04/26/2013   Procedure: LASER ABLATION CONDOLAMATA;  Surgeon: Romie Levee, MD;  Location: [facility];  Service: General;  Laterality: N/A;  . TONSILLECTOMY    . TOTAL KNEE  ARTHROPLASTY Left 11/05/2015   Procedure: LEFT TOTAL KNEE ARTHROPLASTY;  Surgeon: Jene Every, MD;  Location: [facility];  Service: Orthopedics;  Laterality: Left;    FAMILY HISTORY: Family History  Problem Relation Age of Onset  . Diabetes Mother   . Hypertension Mother   . Stroke Mother   . Bowel Disease Brother   . Diabetes Brother   . Healthy Brother   . Emphysema Father   . Heart attack Father     SOCIAL HISTORY:  Social History   Socioeconomic History  . Marital status: Married    Spouse name: Not on file  . Number of children: 3  . Years of education: Not on file  . Highest education level: Not on file  Occupational History  . Not on file  Tobacco Use  . Smoking status: Never Smoker  . Smokeless tobacco: Never Used  Substance and Sexual Activity  . Alcohol use: No  . Drug use: No  . Sexual activity: Not on file  Other Topics Concern  . Not on file  Social History Narrative  . Not on file   Social Determinants of Health   Financial Resource Strain:   . Difficulty of Paying Living Expenses:   Food Insecurity:   . Worried About Programme researcher, broadcasting/film/video in the Last Year:   . Barista in the Last Year:   Transportation Needs:   . Freight forwarder (Medical):   Marland Kitchen Lack of Transportation (Non-Medical):   Physical Activity:   . Days of Exercise per Week:   . Minutes of Exercise per Session:   Stress:   . Feeling of Stress :   Social Connections:   . Frequency of Communication with Friends and Family:   . Frequency of Social Gatherings with Friends and Family:   . Attends Religious Services:   . Active Member of Clubs or Organizations:   . Attends Banker Meetings:   Marland Kitchen Marital Status:   Intimate Partner Violence:   . Fear of Current or Ex-Partner:   . Emotionally Abused:   Marland Kitchen Physically Abused:   . Sexually Abused:      PHYSICAL EXAM  Vitals:   02/24/20 1001  BP: (!) 143/85  Pulse: 66  Temp: (!) 97.3 F (36.3 C)  Weight: 158  lb (71.7 kg)  Height: 5\' 5"  (1.651 m)    Body mass index is 26.29 kg/m.   General: The patient is well-developed and well-nourished and in no acute distress   Neurologic Exam  Mental status: The patient is alert and oriented x 3 at the time of the examination. The patient has apparent normal recent and remote memory, with an apparently normal attention span and concentration ability.   Speech is normal.  Cranial nerves: The extraocular muscles are intact.  Facial strength is normal.  Voice is clear.  Hearing appears normal.  Motor:  Muscle bulk is normal. Tone is mildly increased in her legs.. Strength is 5/5  Sensory: Normal touch sensation in all 4 limbs.  Coordination: Cerebellar testing shows mild right reduced heel to shin.  Finger to nose was normal today  Gait and station: Station is normal and gait is wide though she can walk without a cane.  She has difficulty with tandem gait.  .  The Romberg is borderline.  Reflexes: Deep tendon reflexes are symmetric and brisk bilaterally with crossed abductors at the knees. No ankle clonus.Marland Kitchen        DIAGNOSTIC DATA (LABS, IMAGING, TESTING) - I reviewed patient records, labs, notes, testing and imaging myself where available.  Lab Results  Component Value Date   WBC 4.7 11/19/2019   WBC 4.9 11/19/2019   HGB 13.3 11/19/2019   HGB 13.4 11/19/2019   HCT 39.7 11/19/2019   HCT 41.7 11/19/2019   MCV 89 11/19/2019   MCV 94 11/19/2019   PLT 252 11/19/2019   PLT 287 11/19/2019      Component Value Date/Time   NA 139 11/19/2019 1614   K 4.1 11/19/2019 1614   CL 99 11/19/2019 1614   CO2 24 11/19/2019 1614   GLUCOSE 84 11/19/2019 1614   GLUCOSE 96 06/11/2019 1145   BUN 15 11/19/2019 1614   CREATININE 0.69 11/19/2019 1614   CREATININE 0.72 06/11/2019 1145   CALCIUM 9.3 11/19/2019 1614   PROT 6.8 11/19/2019 1614   ALBUMIN 4.2 11/19/2019 1614   AST 24 11/19/2019 1614   ALT 24 11/19/2019 1614   ALKPHOS 94 11/19/2019 1614    BILITOT 0.4 11/19/2019 1614   GFRNONAA 95 11/19/2019 1614   GFRNONAA 91 06/11/2019 1145   GFRAA 109 11/19/2019 1614   GFRAA 105 06/11/2019 1145   Lab Results  Component Value Date   CHOL 174 06/11/2019   HDL 88 06/11/2019   LDLCALC 74 06/11/2019   TRIG 50 06/11/2019   CHOLHDL 2.0 06/11/2019   Lab Results  Component Value Date   HGBA1C  03/10/2010    5.5 (NOTE) The ADA recommends the following therapeutic goal for glycemic control related to Hgb A1c measurement: Goal of therapy: <6.5 Hgb A1c  Reference: American Diabetes Association: Clinical Practice Recommendations 2010, Diabetes Care, 2010, 33: (Suppl  1).      ASSESSMENT AND PLAN    1. Multiple sclerosis (HCC)   2. Abnormality of gait   3. Attention deficit disorder, unspecified hyperactivity presence   4. Chronic midline low back pain without sciatica   5. High risk medication use      1.   Continue Tecfidera for now.  Lymphocyte count has been in the normal range.  Due to possible exacerbation she will have an MRI this week.  If she does have significant change compared to her last MRI (performed in 2018), then we will need to consider a different disease modifying therapy.  We discussed options including Mayzent, Zeposia, Mavenclad and Ocrevus. 2.   Retention for attention deficit and fatigue.  I will add amantadine to help with the physical fatigue 3.   Renew med's.  The [place] controlled substance database was reviewed and she has been compliant.  She has not shown any drug-seeking behavior.  We discussed not taking more than the prescribed dose due to increased risk. 4.   Follow-up in 4 months or sooner if there are new or worsening neurologic symptoms.  45-minute office visit with the majority of the time spent face-to-face with history and physical, discussion/counseling and decision-making.  Additional time with record review and documentation.    Jaidyn Usery A. Epimenio Foot, MD, PhD 02/24/2020, 10:58 AM Certified  in Neurology, Clinical Neurophysiology, Sleep Medicine, Pain Medicine and Neuroimaging  [facility] [address] [place], [place] [address] [phone] ]

## 2020-02-24 NOTE — Telephone Encounter (Signed)
Spoke to the patient she is scheduled at [facility] for 02/25/20. 

## 2020-02-25 ENCOUNTER — Ambulatory Visit: Payer: 59

## 2020-02-25 ENCOUNTER — Other Ambulatory Visit: Payer: Self-pay

## 2020-02-25 DIAGNOSIS — G35 Multiple sclerosis: Secondary | ICD-10-CM

## 2020-02-25 LAB — COMPREHENSIVE METABOLIC PANEL
ALT: 22 IU/L (ref 0–32)
AST: 20 IU/L (ref 0–40)
Albumin/Globulin Ratio: 1.7 (ref 1.2–2.2)
Albumin: 4.5 g/dL (ref 3.8–4.9)
Alkaline Phosphatase: 99 IU/L (ref 39–117)
BUN/Creatinine Ratio: 31 — ABNORMAL HIGH (ref 12–28)
BUN: 26 mg/dL (ref 8–27)
Bilirubin Total: 0.4 mg/dL (ref 0.0–1.2)
CO2: 29 mmol/L (ref 20–29)
Calcium: 9.4 mg/dL (ref 8.7–10.3)
Chloride: 101 mmol/L (ref 96–106)
Creatinine, Ser: 0.85 mg/dL (ref 0.57–1.00)
GFR calc Af Amer: 86 mL/min/{1.73_m2} (ref >59)
GFR calc non Af Amer: 75 mL/min/{1.73_m2} (ref >59)
Globulin, Total: 2.7 g/dL (ref 1.5–4.5)
Glucose: 99 mg/dL (ref 65–99)
Potassium: 4.5 mmol/L (ref 3.5–5.2)
Sodium: 140 mmol/L (ref 134–144)
Total Protein: 7.2 g/dL (ref 6.0–8.5)

## 2020-02-25 LAB — CBC WITH DIFFERENTIAL/PLATELET
Basophils Absolute: 0 10*3/uL (ref 0.0–0.2)
Basos: 1 %
EOS (ABSOLUTE): 0 10*3/uL (ref 0.0–0.4)
Eos: 1 %
Hematocrit: 42.4 % (ref 34.0–46.6)
Hemoglobin: 14.3 g/dL (ref 11.1–15.9)
Immature Grans (Abs): 0 10*3/uL (ref 0.0–0.1)
Immature Granulocytes: 0 %
Lymphocytes Absolute: 1.3 10*3/uL (ref 0.7–3.1)
Lymphs: 33 %
MCH: 30.9 pg (ref 26.6–33.0)
MCHC: 33.7 g/dL (ref 31.5–35.7)
MCV: 92 fL (ref 79–97)
Monocytes Absolute: 0.4 10*3/uL (ref 0.1–0.9)
Monocytes: 10 %
Neutrophils Absolute: 2.2 10*3/uL (ref 1.4–7.0)
Neutrophils: 55 %
Platelets: 227 10*3/uL (ref 150–450)
RBC: 4.63 x10E6/uL (ref 3.77–5.28)
RDW: 12.6 % (ref 11.7–15.4)
WBC: 3.9 10*3/uL (ref 3.4–10.8)

## 2020-03-01 ENCOUNTER — Encounter: Payer: Self-pay | Admitting: Internal Medicine

## 2020-03-01 NOTE — Patient Instructions (Addendum)
Depo-Medrol 80 mg IM given today.  CBC and c-Met are within normal limits.  Follow-up with Dr. Felecia Shelling who has done an excellent job managing her multiple sclerosis.

## 2020-03-25 ENCOUNTER — Other Ambulatory Visit: Payer: Self-pay

## 2020-03-25 ENCOUNTER — Telehealth: Payer: Self-pay | Admitting: Neurology

## 2020-03-25 MED ORDER — MODAFINIL 200 MG PO TABS: 200.0000 mg | ORAL_TABLET | Freq: Every day | ORAL | 5 refills | Status: DC

## 2020-03-25 NOTE — Telephone Encounter (Signed)
She can stop the amantadine.  She is already on high-dose Ritalin at 60 mg a day.  If she felt she did better on a combination of Ritalin plus Provigil we can send in a prescription for Provigil 200 mg daily.  Otherwise, there is really no other medication that is likely to help

## 2020-03-25 NOTE — Telephone Encounter (Signed)
Called pt back. Relayed Dr. Bonnita Hollow message. She is agreeable to this plan. She would like rx provigil sent to [id number] - [place], [place] - [address]. Advised insurance may require PA, which we would work on if need be. Can take a few days to get approval.  [facility] called [facility], spoke with Selena Batten.Cx rx amantidine on file there.

## 2020-03-25 NOTE — Telephone Encounter (Signed)
Pt called stating that the amantadine (SYMMETREL) 100 MG capsule is not working for her and she is wanting to see if something else can be prescribed to her. Please advise.

## 2020-03-26 MED ORDER — RITALIN 10 MG PO TABS: ORAL_TABLET | ORAL | 0 refills | Status: DC

## 2020-03-26 MED ORDER — HYDROCODONE-ACETAMINOPHEN 7.5-325 MG PO TABS: ORAL_TABLET | ORAL | 0 refills | Status: DC

## 2020-03-26 NOTE — Telephone Encounter (Signed)
Request Reference Number: [id number]. MODAFINIL TAB 200MG  is approved through 03/26/2021. Your patient may now fill this prescription and it will be covered.

## 2020-03-26 NOTE — Telephone Encounter (Signed)
Submitted PA modafinil on CMM. Key: [id number]. Waiting on determination from [facility].

## 2020-04-01 NOTE — Telephone Encounter (Signed)
Phone rep checked office voicemail's,Pt left message stating that modafinil (PROVIGIL) 200 MG tablet is not helping her and she would like a call to discuss

## 2020-04-01 NOTE — Telephone Encounter (Signed)
If the Provigil is not helping any she can stop it.  She is already on a fairly high dose of methylphenidate.    Some of her medications may be making her more fatigued and she could try to lower her dose of hydrocodone and/or gabapentin.[name]

## 2020-04-01 NOTE — Telephone Encounter (Signed)
Dr. [name]- do you have any other recommendations? 

## 2020-04-02 NOTE — Telephone Encounter (Signed)
Called and spoke with pt. Relayed Dr. Bonnita Hollow recommendation. She stated she stopped and restarted provigil yesterday and feels it helped. She is going to continue it for now. She will call in the future if she feels it is ineffective. Nothing further needed.

## 2020-04-16 ENCOUNTER — Other Ambulatory Visit: Payer: Self-pay | Admitting: Neurology

## 2020-04-22 ENCOUNTER — Other Ambulatory Visit: Payer: Self-pay

## 2020-04-22 MED ORDER — RITALIN 10 MG PO TABS: ORAL_TABLET | ORAL | 0 refills | Status: DC

## 2020-04-22 MED ORDER — HYDROCODONE-ACETAMINOPHEN 7.5-325 MG PO TABS: ORAL_TABLET | ORAL | 0 refills | Status: DC

## 2020-05-19 ENCOUNTER — Ambulatory Visit: Payer: 59 | Admitting: Neurology

## 2020-05-21 ENCOUNTER — Other Ambulatory Visit: Payer: Self-pay

## 2020-05-21 MED ORDER — RITALIN 10 MG PO TABS: ORAL_TABLET | ORAL | 0 refills | Status: DC

## 2020-05-21 MED ORDER — HYDROCODONE-ACETAMINOPHEN 7.5-325 MG PO TABS: ORAL_TABLET | ORAL | 0 refills | Status: DC

## 2020-06-18 ENCOUNTER — Other Ambulatory Visit: Payer: Self-pay

## 2020-06-18 MED ORDER — HYDROCODONE-ACETAMINOPHEN 7.5-325 MG PO TABS: ORAL_TABLET | ORAL | 0 refills | Status: DC

## 2020-06-18 MED ORDER — RITALIN 10 MG PO TABS: ORAL_TABLET | ORAL | 0 refills | Status: DC

## 2020-07-08 ENCOUNTER — Telehealth: Payer: Self-pay

## 2020-07-08 NOTE — Telephone Encounter (Signed)
Patient called last week started feeling sick, on Sunday she started coughing, headache, low grade fever and has a sore throat and it is making her MS worse. She said she is feels like she is getting worse. She wants to be seen tomorrow. Please advise.   Call back number [phone]

## 2020-07-08 NOTE — Telephone Encounter (Signed)
Please call patient. I think she and her husband may not have had Covid-19 vaccines. We can schedule a car visit tomorrow and do Covid test

## 2020-07-08 NOTE — Telephone Encounter (Signed)
Called to schedule patient for car visit, she wants to hold off little longer. She was only wanting to come in and get a shot for her MS. She will wait and see if she gets worse with what she feels is her normal sinus, does not feel like she has COVID. Has not had COVID vaccine, stated she that las flu vaccine put her in hospital so she has chosen not to have this one. She verbalized understanding that she would have to have negative COVID test before being able to come into office for any kind of shot. Will call back if she gets worse. She also states that her normal temp is 96.6 and her temp has been 98.6 so that is low grade fever for her.

## 2020-07-09 ENCOUNTER — Ambulatory Visit: Payer: 59 | Admitting: Internal Medicine

## 2020-07-13 ENCOUNTER — Encounter: Payer: Self-pay | Admitting: Neurology

## 2020-07-13 ENCOUNTER — Other Ambulatory Visit: Payer: Self-pay

## 2020-07-13 ENCOUNTER — Ambulatory Visit: Payer: 59 | Admitting: Neurology

## 2020-07-13 VITALS — BP 112/70 | HR 70 | Ht 65.0 in | Wt 155.0 lb

## 2020-07-13 DIAGNOSIS — G8929 Other chronic pain: Secondary | ICD-10-CM

## 2020-07-13 DIAGNOSIS — G35 Multiple sclerosis: Secondary | ICD-10-CM

## 2020-07-13 DIAGNOSIS — F988 Other specified behavioral and emotional disorders with onset usually occurring in childhood and adolescence: Secondary | ICD-10-CM

## 2020-07-13 DIAGNOSIS — M545 Low back pain, unspecified: Secondary | ICD-10-CM

## 2020-07-13 DIAGNOSIS — R269 Unspecified abnormalities of gait and mobility: Secondary | ICD-10-CM

## 2020-07-13 DIAGNOSIS — Z79891 Long term (current) use of opiate analgesic: Secondary | ICD-10-CM

## 2020-07-13 NOTE — Progress Notes (Signed)
[facility]  PATIENT: [name] DOB: 02/19/1961 REASON FOR VISIT: Multiple sclerosis   HISTORICAL  CHIEF COMPLAINT:  Chief Complaint  Patient presents with  . Follow-up    Rm 12 alone here for 6 month f/u alone  . Multiple Sclerosis    Reports the modafinil causes achey feelings and is not using at this time. On Tecfidera- reports this med has been working ok. Reports improved walking when she uses her walker but a few falls have taken place since her last visit.     HISTORY OF PRESENT ILLNESS:  [name] is a 59 y.o. woman with relapsing remitting multiple sclerosis diagnosed in 2008.    Update 07/13/2020: She is experiencing a cough the last few days.    No fevers bu did have a 98.9 over the weekend.  She has not had her Covid-19 vaccination.   Advised to call PCP and consider Covid-19 testing.  She had a reaction to a flu vaccine in the past.  I discussed these vaccines are different and I recommend she get vaccinated.  She is on Tecfidera and tolerates it well.   She feels more clumsy and much more fatigued.   She felt better on Tysabri but was JCV Ab positive.     Gait is the same with some stumbles due to Right leg being weaker than the left .   She tires out very easily.   She wakes up to urinate 2 times a night which is better.   She only takes hyoscyamine prn for bowel and we discussed it may help the bladder..   She wears a Poise pad due to occasional incontinence..    She usually falls back asleep easily.   She denies snoring.   She feels less depressed off any antidepressant  Ritalin (gets name brand 10 mg ) helps her fatigue and attention/focus some - she is taking up to 60 mg a day in split dose.   In the past, modafinil has helped but she often gets a muscular pain.      She had not felt better with Solu-Medrol in January 2019 (had a possible small exacerbation) though another time, she thinks she did feel better  She also has pain in her  back.   Hydrocodone has helped (she takes 6 x 7.5 mg daily).  She is compliant (PDMP has been checked).  When pain flares, she feels her walking does worse.     Vit D has been low but was 34.3 when last checked.             MS History:  In 2008, she had an MRI of the brain performed which was consistent with multiple sclerosis and she was diagnosed with relapsing remitting MS. He was initially placed on Avonex for therapy. She had some exacerbations with weakness and clumsiness with her gait and she started to see Dr. Leotis Shames. He placed her on Tysabri she is on Tysabri between 2010 and 2013. 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   Imaging Review: 12/13/17 MRI of the cervical and thoracic spine:  She has multiple T2 hyperintense lesions in the spinal cord the largest to the right C2-C3 and smaller ones at C3-C4, C4, C5-C6, C6-C7 and T1-T2 all the foci were present compared to the 2016 MRI.   The MRI of the thoracic spine showed small foci  adjacent to T2 and T5-6.  None of the spinal plaques appeared to be acute.    MRI 02/25/2020 brain:   Multiple T2/FLAIR hyperintense foci in the hemispheres in a pattern and configuration consistent with chronic demyelinating plaque associated with multiple sclerosis.  None of the foci appears to be acute.  Compared to the MRI from 03/18/2017, there is no interval change.   REVIEW OF SYSTEMS:  Constitutional: No fevers, chills, sweats, or change in appetite.  She notes fatigue Eyes: No visual changes,eye pain.   She has intermittent diplopia Ear, nose and throat: No hearing loss, ear pain, nasal congestion, sore throat Cardiovascular: No chest pain, palpitations Respiratory:  No shortness of breath at rest or with exertion.   No wheezes GastrointestinaI: No nausea, vomiting, diarrhea, abdominal pain, fecal incontinence Genitourinary:  a sabove Musculoskeletal:  she has  both neck pain and back pain. Also bilat knee pain Integumentary: No rash, pruritus, skin lesions.   She has Raynauds in her hands  Neurological: as above Psychiatric: No depression at this time.  Anxiety doing ok on med's Endocrine: No palpitations, diaphoresis, change in appetite, change in weigh or increased thirst Hematologic/Lymphatic:  No anemia, purpura, petechiae. Allergic/Immunologic: No itchy/runny eyes, nasal congestion, recent allergic reactions, rashes  ALLERGIES: Allergies  Allergen Reactions  . Ibuprofen Other (See Comments)    CAUSES LYMPHEDEMA  . Nsaids Other (See Comments)    AVOIDS ANY SODIUM CONTAINING MED. -- CAUSES LYMPHEDEMA  . Other Swelling and Other (See Comments)    Laxatives- causes leg swelling artificial sweeteners    HOME MEDICATIONS: Outpatient Medications Prior to Visit  Medication Sig Dispense Refill  . ALPRAZolam (XANAX) 0.5 MG tablet TAKE 1 TABLET BY MOUTH 3 TIMES A DAY AS NEEDED FOR ANXIETY/SLEEP 90 tablet 2  . cyanocobalamin (,VITAMIN B-12,) 1000 MCG/ML injection INJECT 1 ML TWICE PER MONTH 2 mL 11  . gabapentin (NEURONTIN) 600 MG tablet TAKE 1 TABLET (600 MG TOTAL) BY MOUTH 2 (TWO) TIMES DAILY. 180 tablet 3  . HYDROcodone-acetaminophen (NORCO) 7.5-325 MG tablet TAKE 2 TABLETS BY MOUTH EVERY 8 HOURS AS NEEDED 180 tablet 0  . hyoscyamine (LEVSIN SL) 0.125 MG SL tablet Place 1 tablet (0.125 mg total) under the tongue every 4 (four) hours as needed. 270 tablet 3  . Magnesium 200 MG TABS Take 200 mg by mouth daily. +malic acid    . metoprolol tartrate (LOPRESSOR) 50 MG tablet TAKE 1/2 TABLET BY MOUTH EVERY 12 HOURS AS NEEDED FOR PALPITATIONS 90 tablet 2  . RITALIN 10 MG tablet BRAND NAME NECESSARY.    Take six po daily in split doses 180 tablet 0  . Syringe/Needle, Disp, (SYRINGE 3CC/23GX1") 23G X 1" 3 ML MISC 3 Syringes by Does not apply route every 30 (thirty) days. (Patient taking differently: 3 Syringes by Does not apply route every 30 (thirty)  days. 26Gx5/6") 3 each 3  . TECFIDERA 240 MG CPDR Take one capsule by mouth twice daily 60 capsule 12  . Vitamin D, Ergocalciferol, (DRISDOL) 1.25 MG (50000 UNIT) CAPS capsule TAKE 1 CAPSULE (50,000 UNITS TOTAL) BY MOUTH EVERY 7 (SEVEN) DAYS. 13 capsule 1  . calcium-vitamin D 250-100 MG-UNIT tablet Take 1 tablet by mouth 2 (two) times daily. (Patient not taking: Reported on 07/13/2020)    . modafinil (PROVIGIL) 200 MG tablet Take 1 tablet (200 mg total) by mouth daily. (Patient not taking: Reported on 07/13/2020) 30 tablet 5   No facility-administered medications prior to visit.    PAST MEDICAL HISTORY: Past  Medical History:  Diagnosis Date  . ADD (attention deficit disorder)   . Anxiety   . Arthritis   . Bladder incontinence    wears a pad  . Bone spur    cervical bone spurs  . Chronic fatigue   . Dysrhythmia    hx A-FIB  . 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 Headache(784.0)   . History of concussion YOUNG ADULT--  NO RESIDUAL  . IBS (irritable bowel syndrome)   . Movement disorder   . MS (multiple sclerosis) (HCC)   . MVP (mitral valve prolapse) HX HEART RACING  YRS AGO   ASYMPTOMATIC SINCE THEN  . Vision abnormalities   . Weakness of right leg SECONDARY TO MS    PAST SURGICAL HISTORY: Past Surgical History:  Procedure Laterality Date  . CESAREAN SECTION  X3   BILATERAL TUBAL LIGATION W/ LAST ONE  . DESTRUCTION OF ANAL CONDYLOMA  07-07-2005  DR TOTH  . HERNIA REPAIR    . KNEE ARTHROSCOPY WITH LATERAL MENISECTOMY  10/26/2012   Procedure: KNEE ARTHROSCOPY WITH LATERAL MENISECTOMY;  Surgeon: Javier Docker, MD;  Location: [facility];  Service: Orthopedics;  Laterality: Left;  Left Knee Arthrsocopy with Partial Lateral Menisectomy  . LAPAROSCOPIC ASSISTED VAGINAL HYSTERECTOMY  01-15-2002  DR Gerlene Burdock HOLLAND/ DR TIM DAVIS   AND REPAIR VENTRAL HERNIA  . LASER ABLATION CONDOLAMATA N/A 04/26/2013   Procedure: LASER ABLATION CONDOLAMATA;  Surgeon: Romie Levee, MD;  Location: [facility];  Service: General;  Laterality: N/A;  . TONSILLECTOMY    . TOTAL KNEE ARTHROPLASTY Left 11/05/2015   Procedure: LEFT TOTAL KNEE ARTHROPLASTY;  Surgeon: Jene Every, MD;  Location: [facility];  Service: Orthopedics;  Laterality: Left;    FAMILY HISTORY: Family History  Problem Relation Age of Onset  . Diabetes Mother   . Hypertension Mother   . Stroke Mother   . Bowel Disease Brother   . Diabetes Brother   . Healthy Brother   . Emphysema Father   . Heart attack Father     SOCIAL HISTORY:  Social History   Socioeconomic History  . Marital status: Married    Spouse name: Not on file  . Number of children: 3  . Years of education: Not on file  . Highest education level: Not on file  Occupational History  . Not on file  Tobacco Use  . Smoking status: Never Smoker  . Smokeless tobacco: Never Used  Substance and Sexual Activity  . Alcohol use: No  . Drug use: No  . Sexual activity: Not on file  Other Topics Concern  . Not on file  Social History Narrative  . Not on file   Social Determinants of Health   Financial Resource Strain:   . Difficulty of Paying Living Expenses:   Food Insecurity:   . Worried About Programme researcher, broadcasting/film/video in the Last Year:   . Barista in the Last Year:   Transportation Needs:   . Freight forwarder (Medical):   Marland Kitchen Lack of Transportation (Non-Medical):   Physical Activity:   . Days of Exercise per Week:   . Minutes of Exercise per Session:   Stress:   . Feeling of Stress :   Social Connections:   . Frequency of Communication with Friends and Family:   . Frequency of Social Gatherings with Friends and Family:   . Attends Religious Services:   . Active Member of Clubs or Organizations:   .  Attends Banker Meetings:   Marland Kitchen Marital Status:   Intimate Partner Violence:   . Fear of Current or Ex-Partner:   . Emotionally Abused:   Marland Kitchen Physically Abused:   . Sexually Abused:       PHYSICAL EXAM  Vitals:   07/13/20 0959  BP: 112/70  Pulse: 70  SpO2: 97%  Weight: 155 lb (70.3 kg)  Height: 5\' 5"  (1.651 m)    Body mass index is 25.79 kg/m.   General: The patient is well-developed and well-nourished and in no acute distress   Neurologic Exam  Mental status: The patient is alert and oriented x 3 at the time of the examination. The patient has apparent normal recent and remote memory, with an apparently normal attention span and concentration ability.   Speech is normal.  Cranial nerves: The extraocular muscles are intact.  Normal facial strength.    Voice is clear.  Hearing appears normal.  Motor:  Muscle bulk is normal. Tone is mildly increased in her legs.. Strength is 5/5  Coordination: Cerebellar testing shows mild right reduced heel to shin.  Finger to nose was normal today  Gait and station: Station is normal and gait is wide though she can walk without a cane.  She has difficulty with tandem gait.   The Romberg is borderline.  Reflexes: Deep tendon reflexes are symmetric and brisk bilaterally with crossed abductors at the knees, worse on right. No ankle clonus.        DIAGNOSTIC DATA (LABS, IMAGING, TESTING) - I reviewed patient records, labs, notes, testing and imaging myself where available.  Lab Results  Component Value Date   WBC 3.9 02/24/2020   HGB 14.3 02/24/2020   HCT 42.4 02/24/2020   MCV 92 02/24/2020   PLT 227 02/24/2020      Component Value Date/Time   NA 140 02/24/2020 1103   K 4.5 02/24/2020 1103   CL 101 02/24/2020 1103   CO2 29 02/24/2020 1103   GLUCOSE 99 02/24/2020 1103   GLUCOSE 96 06/11/2019 1145   BUN 26 02/24/2020 1103   CREATININE 0.85 02/24/2020 1103   CREATININE 0.72 06/11/2019 1145   CALCIUM 9.4 02/24/2020 1103   PROT 7.2 02/24/2020 1103   ALBUMIN 4.5 02/24/2020 1103   AST 20 02/24/2020 1103   ALT 22 02/24/2020 1103   ALKPHOS 99 02/24/2020 1103   BILITOT 0.4 02/24/2020 1103   GFRNONAA 75  02/24/2020 1103   GFRNONAA 91 06/11/2019 1145   GFRAA 86 02/24/2020 1103   GFRAA 105 06/11/2019 1145   Lab Results  Component Value Date   CHOL 174 06/11/2019   HDL 88 06/11/2019   LDLCALC 74 06/11/2019   TRIG 50 06/11/2019   CHOLHDL 2.0 06/11/2019   Lab Results  Component Value Date   HGBA1C  03/10/2010    5.5 (NOTE) The ADA recommends the following therapeutic goal for glycemic control related to Hgb A1c measurement: Goal of therapy: <6.5 Hgb A1c  Reference: American Diabetes Association: Clinical Practice Recommendations 2010, Diabetes Care, 2010, 33: (Suppl  1).      ASSESSMENT AND PLAN    1. Multiple sclerosis (HCC)   2. Chronic prescription opiate use   3. Abnormality of gait   4. Attention deficit disorder, unspecified hyperactivity presence   5. Chronic midline low back pain without sciatica      1.   Continue Tecfidera for now.  Lymphocyte count has been in the normal range.  Will need to recheck CBC/Diff at next  visit. 2.   Ritalin and prn modafinil for attention deficit and fatigue.   3.   Continue med's.  The [place] controlled substance database was reviewed and she has been compliant.  She has not shown any drug-seeking behavior.  We discussed not taking more than the prescribed dose due to increased risk. 4.   Follow-up in 4 months or sooner if there are new or worsening neurologic symptoms.     Ilhan Madan A. Epimenio Foot, MD, PhD 07/13/2020, 10:38 AM Certified in Neurology, Clinical Neurophysiology, Sleep Medicine, Pain Medicine and Neuroimaging  [facility] [address], [address] [place], [place] [address] [phone] ]

## 2020-07-17 ENCOUNTER — Other Ambulatory Visit: Payer: Self-pay

## 2020-07-20 ENCOUNTER — Telehealth: Payer: Self-pay | Admitting: Neurology

## 2020-07-20 MED ORDER — HYDROCODONE-ACETAMINOPHEN 7.5-325 MG PO TABS: ORAL_TABLET | ORAL | 0 refills | Status: DC

## 2020-07-20 MED ORDER — RITALIN 10 MG PO TABS: ORAL_TABLET | ORAL | 0 refills | Status: DC

## 2020-07-20 NOTE — Telephone Encounter (Signed)
Sent request to MD to e-scribe rx's

## 2020-07-20 NOTE — Telephone Encounter (Signed)
Pt request refill HYDROcodone-acetaminophen (NORCO) 7.5-325 MG tablet and RITALIN 10 MG tablet at [facility] [phone]

## 2020-07-21 LAB — DRUG SCREEN, UR (12+OXYCODONE+CRT)
Amphetamine Scrn, Ur: NEGATIVE ng/mL
BARBITURATE SCREEN URINE: NEGATIVE ng/mL
BENZODIAZEPINE SCREEN, URINE: NEGATIVE ng/mL
CANNABINOIDS UR QL SCN: NEGATIVE ng/mL
Cocaine (Metab) Scrn, Ur: NEGATIVE ng/mL
Creatinine(Crt), U: 213.8 mg/dL (ref 20.0–300.0)
Fentanyl, Urine: NEGATIVE pg/mL
Meperidine Screen, Urine: NEGATIVE ng/mL
Methadone Screen, Urine: NEGATIVE ng/mL
OXYCODONE+OXYMORPHONE UR QL SCN: NEGATIVE ng/mL
Opiate Scrn, Ur: POSITIVE ng/mL — AB
Ph of Urine: 6.9 (ref 4.5–8.9)
Phencyclidine Qn, Ur: NEGATIVE ng/mL
Propoxyphene Scrn, Ur: NEGATIVE ng/mL
SPECIFIC GRAVITY: 1.025
Tramadol Screen, Urine: NEGATIVE ng/mL

## 2020-08-17 ENCOUNTER — Other Ambulatory Visit: Payer: Self-pay

## 2020-08-17 MED ORDER — HYDROCODONE-ACETAMINOPHEN 7.5-325 MG PO TABS: ORAL_TABLET | ORAL | 0 refills | Status: DC

## 2020-08-17 MED ORDER — RITALIN 10 MG PO TABS: ORAL_TABLET | ORAL | 0 refills | Status: DC

## 2020-08-27 ENCOUNTER — Other Ambulatory Visit: Payer: Self-pay | Admitting: Neurology

## 2020-08-27 DIAGNOSIS — G35 Multiple sclerosis: Secondary | ICD-10-CM

## 2020-09-04 ENCOUNTER — Other Ambulatory Visit: Payer: Self-pay | Admitting: Neurology

## 2020-09-14 ENCOUNTER — Other Ambulatory Visit: Payer: Self-pay | Admitting: Neurology

## 2020-09-14 MED ORDER — RITALIN 10 MG PO TABS: ORAL_TABLET | ORAL | 0 refills | Status: DC

## 2020-09-14 MED ORDER — HYDROCODONE-ACETAMINOPHEN 7.5-325 MG PO TABS: ORAL_TABLET | ORAL | 0 refills | Status: DC

## 2020-09-15 ENCOUNTER — Other Ambulatory Visit: Payer: 59 | Admitting: Internal Medicine

## 2020-09-15 ENCOUNTER — Other Ambulatory Visit: Payer: Self-pay

## 2020-09-15 DIAGNOSIS — M1990 Unspecified osteoarthritis, unspecified site: Secondary | ICD-10-CM

## 2020-09-15 DIAGNOSIS — Z0000 Encounter for general adult medical examination without abnormal findings: Secondary | ICD-10-CM

## 2020-09-15 DIAGNOSIS — G35 Multiple sclerosis: Secondary | ICD-10-CM

## 2020-09-15 DIAGNOSIS — I480 Paroxysmal atrial fibrillation: Secondary | ICD-10-CM

## 2020-09-16 LAB — CBC WITH DIFFERENTIAL/PLATELET
Absolute Monocytes: 314 cells/uL (ref 200–950)
Basophils Absolute: 30 cells/uL (ref 0–200)
Basophils Relative: 0.9 %
Eosinophils Absolute: 30 cells/uL (ref 15–500)
Eosinophils Relative: 0.9 %
HCT: 43.7 % (ref 35.0–45.0)
Hemoglobin: 14.7 g/dL (ref 11.7–15.5)
Lymphs Abs: 1195 cells/uL (ref 850–3900)
MCH: 30.6 pg (ref 27.0–33.0)
MCHC: 33.6 g/dL (ref 32.0–36.0)
MCV: 91 fL (ref 80.0–100.0)
MPV: 10.8 fL (ref 7.5–12.5)
Monocytes Relative: 9.5 %
Neutro Abs: 1733 cells/uL (ref 1500–7800)
Neutrophils Relative %: 52.5 %
Platelets: 239 10*3/uL (ref 140–400)
RBC: 4.8 10*6/uL (ref 3.80–5.10)
RDW: 12.6 % (ref 11.0–15.0)
Total Lymphocyte: 36.2 %
WBC: 3.3 10*3/uL — ABNORMAL LOW (ref 3.8–10.8)

## 2020-09-16 LAB — COMPLETE METABOLIC PANEL WITH GFR
AG Ratio: 1.6 (calc) (ref 1.0–2.5)
ALT: 16 U/L (ref 6–29)
AST: 19 U/L (ref 10–35)
Albumin: 4.5 g/dL (ref 3.6–5.1)
Alkaline phosphatase (APISO): 87 U/L (ref 37–153)
BUN: 20 mg/dL (ref 7–25)
CO2: 30 mmol/L (ref 20–32)
Calcium: 9.6 mg/dL (ref 8.6–10.4)
Chloride: 101 mmol/L (ref 98–110)
Creat: 0.75 mg/dL (ref 0.50–0.99)
GFR, Est African American: 100 mL/min/{1.73_m2} (ref >=60)
GFR, Est Non African American: 86 mL/min/{1.73_m2} (ref >=60)
Globulin: 2.8 g/dL (calc) (ref 1.9–3.7)
Glucose, Bld: 89 mg/dL (ref 65–99)
Potassium: 4.2 mmol/L (ref 3.5–5.3)
Sodium: 141 mmol/L (ref 135–146)
Total Bilirubin: 0.6 mg/dL (ref 0.2–1.2)
Total Protein: 7.3 g/dL (ref 6.1–8.1)

## 2020-09-16 LAB — LIPID PANEL
Cholesterol: 213 mg/dL — ABNORMAL HIGH (ref <200)
HDL: 99 mg/dL (ref >=50)
LDL Cholesterol (Calc): 100 mg/dL (calc) — ABNORMAL HIGH
Non-HDL Cholesterol (Calc): 114 mg/dL (calc) (ref <130)
Total CHOL/HDL Ratio: 2.2 (calc) (ref <5.0)
Triglycerides: 57 mg/dL (ref <150)

## 2020-09-16 LAB — TSH: TSH: 1.61 mIU/L (ref 0.40–4.50)

## 2020-09-17 ENCOUNTER — Other Ambulatory Visit: Payer: Self-pay

## 2020-09-17 ENCOUNTER — Ambulatory Visit: Payer: 59 | Admitting: Internal Medicine

## 2020-09-17 ENCOUNTER — Encounter: Payer: Self-pay | Admitting: Internal Medicine

## 2020-09-17 VITALS — BP 130/90 | HR 80 | Ht 65.0 in | Wt 156.0 lb

## 2020-09-17 DIAGNOSIS — G35 Multiple sclerosis: Secondary | ICD-10-CM | POA: Diagnosis not present

## 2020-09-17 DIAGNOSIS — F439 Reaction to severe stress, unspecified: Secondary | ICD-10-CM

## 2020-09-17 DIAGNOSIS — F32A Depression, unspecified: Secondary | ICD-10-CM

## 2020-09-17 DIAGNOSIS — N390 Urinary tract infection, site not specified: Secondary | ICD-10-CM

## 2020-09-17 DIAGNOSIS — R8290 Unspecified abnormal findings in urine: Secondary | ICD-10-CM

## 2020-09-17 DIAGNOSIS — Z1211 Encounter for screening for malignant neoplasm of colon: Secondary | ICD-10-CM

## 2020-09-17 DIAGNOSIS — G35D Multiple sclerosis, unspecified: Secondary | ICD-10-CM

## 2020-09-17 DIAGNOSIS — Z0000 Encounter for general adult medical examination without abnormal findings: Secondary | ICD-10-CM | POA: Diagnosis not present

## 2020-09-17 DIAGNOSIS — B9620 Unspecified Escherichia coli [E. coli] as the cause of diseases classified elsewhere: Secondary | ICD-10-CM

## 2020-09-17 DIAGNOSIS — Z8679 Personal history of other diseases of the circulatory system: Secondary | ICD-10-CM

## 2020-09-17 DIAGNOSIS — F419 Anxiety disorder, unspecified: Secondary | ICD-10-CM

## 2020-09-17 DIAGNOSIS — Z87898 Personal history of other specified conditions: Secondary | ICD-10-CM

## 2020-09-17 LAB — POCT URINALYSIS DIPSTICK
Appearance: NEGATIVE
Blood, UA: NEGATIVE
Glucose, UA: NEGATIVE
Ketones, UA: NEGATIVE
Leukocytes, UA: NEGATIVE
Odor: ABNORMAL
Protein, UA: NEGATIVE
Spec Grav, UA: 1.015 (ref 1.010–1.025)
Urobilinogen, UA: 0.2 E.U./dL
pH, UA: 6 (ref 5.0–8.0)

## 2020-09-17 NOTE — Patient Instructions (Addendum)
Declines flu vaccine and Covid-19 vaccine. To see GYN. Needs mammogram and bone density and wants that through GYN. Cologard ordered.
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
## 2020-09-19 LAB — URINE CULTURE
MICRO NUMBER:: 11072360
SPECIMEN QUALITY:: ADEQUATE

## 2020-09-21 ENCOUNTER — Telehealth: Payer: Self-pay | Admitting: Internal Medicine

## 2020-09-21 ENCOUNTER — Other Ambulatory Visit: Payer: Self-pay

## 2020-09-21 MED ORDER — AMOXICILLIN 250 MG PO CAPS: 250.0000 mg | ORAL_CAPSULE | Freq: Three times a day (TID) | ORAL | 0 refills | Status: DC

## 2020-09-21 NOTE — Telephone Encounter (Signed)
[name] called to say, she seen her lab results on My chart and was wandering if she should have an antibiotic since they come back positive for and bacteria infections.

## 2020-09-21 NOTE — Telephone Encounter (Signed)
Patient was notified of results and she is going to pick up amoxicillin today. She said she will call back in 2 weeks to schedule urine recheck.

## 2020-10-05 ENCOUNTER — Encounter: Payer: Self-pay | Admitting: Internal Medicine

## 2020-10-05 LAB — COLOGUARD: Cologuard: NEGATIVE

## 2020-10-12 ENCOUNTER — Other Ambulatory Visit: Payer: Self-pay

## 2020-10-12 MED ORDER — HYDROCODONE-ACETAMINOPHEN 7.5-325 MG PO TABS
ORAL_TABLET | ORAL | 0 refills | Status: DC
Start: 2020-10-12 — End: 2020-11-09

## 2020-10-12 MED ORDER — RITALIN 10 MG PO TABS
ORAL_TABLET | ORAL | 0 refills | Status: DC
Start: 2020-10-12 — End: 2020-11-09

## 2020-10-14 ENCOUNTER — Encounter: Payer: Self-pay | Admitting: Internal Medicine

## 2020-10-14 LAB — COLOGUARD: Cologuard: NEGATIVE

## 2020-10-27 ENCOUNTER — Telehealth: Payer: Self-pay | Admitting: Neurology

## 2020-10-27 NOTE — Telephone Encounter (Signed)
[facility] Ivin Booty) called, notifying TECFIDERA 240 MG CPDR cost the patient $50. Pt would pay less with the generic brand Dimethyl-Fumarate. Would like a call from the nurse.

## 2020-10-27 NOTE — Telephone Encounter (Signed)
Called pt. She wants to stay on Tecfidera (brand) and pay 50.00 copay. Does not want to change to generic at this time. She asked I r/s upcoming appt with her from 11/12/20 to 11/16/20 with Dr. Epimenio Foot at 10am. Her grandson's birthday was on 11/12/20 and she wanted another day.

## 2020-11-09 ENCOUNTER — Other Ambulatory Visit: Payer: Self-pay

## 2020-11-09 MED ORDER — HYDROCODONE-ACETAMINOPHEN 7.5-325 MG PO TABS
ORAL_TABLET | ORAL | 0 refills | Status: DC
Start: 2020-11-09 — End: 2020-12-08

## 2020-11-09 MED ORDER — RITALIN 10 MG PO TABS
ORAL_TABLET | ORAL | 0 refills | Status: DC
Start: 2020-11-09 — End: 2020-12-08

## 2020-11-12 ENCOUNTER — Ambulatory Visit: Payer: 59 | Admitting: Family Medicine

## 2020-11-16 ENCOUNTER — Ambulatory Visit: Payer: 59 | Admitting: Neurology

## 2020-11-16 ENCOUNTER — Encounter: Payer: Self-pay | Admitting: Neurology

## 2020-11-16 VITALS — BP 145/78 | HR 66 | Ht 65.0 in

## 2020-11-16 DIAGNOSIS — Z79899 Other long term (current) drug therapy: Secondary | ICD-10-CM | POA: Diagnosis not present

## 2020-11-16 DIAGNOSIS — F988 Other specified behavioral and emotional disorders with onset usually occurring in childhood and adolescence: Secondary | ICD-10-CM

## 2020-11-16 DIAGNOSIS — N3941 Urge incontinence: Secondary | ICD-10-CM | POA: Diagnosis not present

## 2020-11-16 DIAGNOSIS — R269 Unspecified abnormalities of gait and mobility: Secondary | ICD-10-CM

## 2020-11-16 DIAGNOSIS — G35 Multiple sclerosis: Secondary | ICD-10-CM | POA: Diagnosis not present

## 2020-11-16 DIAGNOSIS — R208 Other disturbances of skin sensation: Secondary | ICD-10-CM

## 2020-11-16 DIAGNOSIS — Z79891 Long term (current) use of opiate analgesic: Secondary | ICD-10-CM

## 2020-11-16 MED ORDER — HYOSCYAMINE SULFATE 0.125 MG SL SUBL
0.1250 mg | SUBLINGUAL_TABLET | SUBLINGUAL | 3 refills | Status: DC | PRN
Start: 2020-11-16 — End: 2023-03-16

## 2020-11-16 NOTE — Progress Notes (Signed)
[facility]  PATIENT: [name] DOB: 1961-03-06 REASON FOR VISIT: Multiple sclerosis   HISTORICAL  CHIEF COMPLAINT:  Chief Complaint  Patient presents with  . Follow-up    RM 16. Last seen 07/13/20. On Tecfidera for MS (brand name)    HISTORY OF PRESENT ILLNESS:  [name] is a 59 y.o. woman with relapsing remitting multiple sclerosis diagnosed in 2008.    Update 11/16/2020: She has had more fatigue.   She felt better after a B12 injection (took early due to her symptoms).Marland Kitchen   She was prescribed B12 every two weeks.  She had more fatigue also back in October when she had a UTI.   In June she had a cold and also felt very tired.     Modafinil helps fatigue but her back hurts more.  Ritalin helps her ADD and fatigue the most and name brand helps more than random generic but insurance will not always cover it.  One specific generic helped her as well but pharmacy can not always get it.   She is on 6 x 10 mg daily.     She is on Tecfidera and tolerates it well.   She feels more clumsy and much more fatigued.   She felt better on Tysabri but was JCV Ab positive.   Lymphocytes 09/15/2020 was good at 1.2.  LFTs were fine as well.  She has right leg weakness and gat is off, sometimes dragging the right leg .   She tires out very easily.   She wakes up to urinate 3-4 times a night which is better.   She only takes hyoscyamine prn for bowel and we discussed it may help the bladder..   She wears a Poise pad due to occasional incontinence..    She usually falls back asleep easily.   She denies snoring.   She feels less depressed off any antidepressant  Ritalin (gets name brand 10 mg ) helps her fatigue and attention/focus some - she is taking up to 60 mg a day in split dose.   In the past, modafinil has helped but she often gets a muscular pain.      She had not felt better with Solu-Medrol in January 2019 (had a possible small exacerbation) though another time, she thinks  she did feel better  She also has pain in her back.   Hydrocodone has helped (she takes 6 x 7.5 mg daily).  She is compliant (PDMP has been checked).  When pain flares, she feels her walking does worse.     Vit D has been low in the past but was 34.3 when last checked.     Vit B12 was also low in the past.        MS History:  In 2008, she had an MRI of the brain performed which was consistent with multiple sclerosis and she was diagnosed with relapsing remitting MS. He was initially placed on Avonex for therapy. She had some exacerbations with weakness and clumsiness with her gait and she started to see Dr. Leotis Shames. He placed her on Tysabri she is on Tysabri between 2010 and 2013. 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   Imaging Review: 12/13/17 MRI of the cervical and thoracic spine:  She has multiple T2 hyperintense lesions in the spinal cord the largest to the right C2-C3 and smaller ones at  C3-C4, C4, C5-C6, C6-C7 and T1-T2 all the foci were present compared to the 2016 MRI.   The MRI of the thoracic spine showed small foci adjacent to T2 and T5-6.  None of the spinal plaques appeared to be acute.    MRI 02/25/2020 brain:   Multiple T2/FLAIR hyperintense foci in the hemispheres in a pattern and configuration consistent with chronic demyelinating plaque associated with multiple sclerosis.  None of the foci appears to be acute.  Compared to the MRI from 03/18/2017, there is no interval change.   REVIEW OF SYSTEMS:  Constitutional: No fevers, chills, sweats, or change in appetite.  She notes fatigue and attention deficit Eyes: No visual changes,eye pain.   She has intermittent diplopia Ear, nose and throat: No hearing loss, ear pain, nasal congestion, sore throat Cardiovascular: No chest pain, palpitations Respiratory:  No shortness of breath at rest or with exertion.   No wheezes GastrointestinaI: No  nausea, vomiting, diarrhea, abdominal pain, fecal incontinence Genitourinary:  a sabove Musculoskeletal:  she has both neck pain and back pain. Also bilat knee pain Integumentary: No rash, pruritus, skin lesions.   She has Raynauds in her hands  Neurological: as above Psychiatric: No depression at this time.  Anxiety doing ok on med's Endocrine: No palpitations, diaphoresis, change in appetite, change in weigh or increased thirst Hematologic/Lymphatic:  No anemia, purpura, petechiae. Allergic/Immunologic: No itchy/runny eyes, nasal congestion, recent allergic reactions, rashes  ALLERGIES: Allergies  Allergen Reactions  . Ibuprofen Other (See Comments)    CAUSES LYMPHEDEMA  . Nsaids Other (See Comments)    AVOIDS ANY SODIUM CONTAINING MED. -- CAUSES LYMPHEDEMA  . Other Swelling and Other (See Comments)    Laxatives- causes leg swelling artificial sweeteners    HOME MEDICATIONS: Outpatient Medications Prior to Visit  Medication Sig Dispense Refill  . ALPRAZolam (XANAX) 0.5 MG tablet TAKE 1 TABLET BY MOUTH 3 TIMES A DAY AS NEEDED FOR ANXIETY/SLEEP 90 tablet 2  . cyanocobalamin (,VITAMIN B-12,) 1000 MCG/ML injection INJECT 1 ML TWICE PER MONTH 2 mL 11  . gabapentin (NEURONTIN) 600 MG tablet TAKE 1 TABLET (600 MG TOTAL) BY MOUTH 2 (TWO) TIMES DAILY. 180 tablet 3  . HYDROcodone-acetaminophen (NORCO) 7.5-325 MG tablet TAKE 2 TABLETS BY MOUTH EVERY 8 HOURS AS NEEDED 180 tablet 0  . metoprolol tartrate (LOPRESSOR) 50 MG tablet TAKE 1/2 TABLET BY MOUTH EVERY 12 HOURS AS NEEDED FOR PALPITATIONS 90 tablet 2  . modafinil (PROVIGIL) 200 MG tablet Take 1 tablet (200 mg total) by mouth daily. 30 tablet 5  . RITALIN 10 MG tablet BRAND NAME NECESSARY.    Take six po daily in split doses 180 tablet 0  . Syringe/Needle, Disp, (SYRINGE 3CC/23GX1") 23G X 1" 3 ML MISC 3 Syringes by Does not apply route every 30 (thirty) days. (Patient taking differently: 3 Syringes by Does not apply route every 30 (thirty)  days. 26Gx5/6") 3 each 3  . TECFIDERA 240 MG CPDR TAKE 1 CAPSULE ( ) BY  MOUTH TWICE DAILY 60 capsule 12  . Vitamin D, Ergocalciferol, (DRISDOL) 1.25 MG (50000 UNIT) CAPS capsule TAKE 1 CAPSULE (50,000 UNITS TOTAL) BY MOUTH EVERY 7 (SEVEN) DAYS. 13 capsule 1  . hyoscyamine (LEVSIN SL) 0.125 MG SL tablet Place 1 tablet (0.125 mg total) under the tongue every 4 (four) hours as needed. 270 tablet 3  . amoxicillin (AMOXIL) 250 MG capsule Take 1 capsule (250 mg total) by mouth 3 (three) times daily. 30 capsule 0   No facility-administered medications prior to  visit.    PAST MEDICAL HISTORY: Past Medical History:  Diagnosis Date  . ADD (attention deficit disorder)   . Anxiety   . Arthritis   . Bladder incontinence    wears a pad  . Bone spur    cervical bone spurs  . Chronic fatigue   . Dysrhythmia    hx A-FIB  . 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 Headache(784.0)   . History of concussion YOUNG ADULT--  NO RESIDUAL  . IBS (irritable bowel syndrome)   . Movement disorder   . MS (multiple sclerosis) (HCC)   . MVP (mitral valve prolapse) HX HEART RACING  YRS AGO   ASYMPTOMATIC SINCE THEN  . Vision abnormalities   . Weakness of right leg SECONDARY TO MS    PAST SURGICAL HISTORY: Past Surgical History:  Procedure Laterality Date  . CESAREAN SECTION  X3   BILATERAL TUBAL LIGATION W/ LAST ONE  . DESTRUCTION OF ANAL CONDYLOMA  07-07-2005  DR TOTH  . HERNIA REPAIR    . KNEE ARTHROSCOPY WITH LATERAL MENISECTOMY  10/26/2012   Procedure: KNEE ARTHROSCOPY WITH LATERAL MENISECTOMY;  Surgeon: Javier Docker, MD;  Location: [facility];  Service: Orthopedics;  Laterality: Left;  Left Knee Arthrsocopy with Partial Lateral Menisectomy  . LAPAROSCOPIC ASSISTED VAGINAL HYSTERECTOMY  01-15-2002  DR Gerlene Burdock HOLLAND/ DR TIM DAVIS   AND REPAIR VENTRAL HERNIA  . LASER ABLATION CONDOLAMATA N/A 04/26/2013   Procedure: LASER ABLATION CONDOLAMATA;  Surgeon: Romie Levee, MD;   Location: [facility];  Service: General;  Laterality: N/A;  . TONSILLECTOMY    . TOTAL KNEE ARTHROPLASTY Left 11/05/2015   Procedure: LEFT TOTAL KNEE ARTHROPLASTY;  Surgeon: Jene Every, MD;  Location: [facility];  Service: Orthopedics;  Laterality: Left;    FAMILY HISTORY: Family History  Problem Relation Age of Onset  . Diabetes Mother   . Hypertension Mother   . Stroke Mother   . Bowel Disease Brother   . Diabetes Brother   . Healthy Brother   . Emphysema Father   . Heart attack Father     SOCIAL HISTORY:  Social History   Socioeconomic History  . Marital status: Married    Spouse name: Not on file  . Number of children: 3  . Years of education: Not on file  . Highest education level: Not on file  Occupational History  . Not on file  Tobacco Use  . Smoking status: Never Smoker  . Smokeless tobacco: Never Used  Substance and Sexual Activity  . Alcohol use: No  . Drug use: No  . Sexual activity: Not on file  Other Topics Concern  . Not on file  Social History Narrative  . Not on file   Social Determinants of Health   Financial Resource Strain: Not on file  Food Insecurity: Not on file  Transportation Needs: Not on file  Physical Activity: Not on file  Stress: Not on file  Social Connections: Not on file  Intimate Partner Violence: Not on file     PHYSICAL EXAM  Vitals:   11/16/20 1009  BP: (!) 145/78  Pulse: 66  SpO2: 96%  Height: 5\' 5"  (1.651 m)    Body mass index is 25.96 kg/m.   General: The patient is well-developed and well-nourished and in no acute distress   Neurologic Exam  Mental status: The patient is alert and oriented x 3 at the time of the examination. The patient has apparent normal  recent and remote memory, with an apparently normal attention span and concentration ability.   Speech is normal.  Cranial nerves: The extraocular muscles are intact.  Normal facial strength.    Voice is clear.  Hearing appears  normal.  Motor:  Muscle bulk is normal. Tone is mildly increased in her legs.. Strength is 5/5  Coordination: Cerebellar testing shows mild right reduced heel to shin.  Finger to nose was normal today  Gait and station: Station is normal and gait is wide though she can walk without a cane.  She has difficulty with tandem gait.   The Romberg is borderline.  Reflexes: Deep tendon reflexes are symmetric and brisk bilaterally with crossed abductors at the knees, worse on right. No ankle clonus.Marland Kitchen        DIAGNOSTIC DATA (LABS, IMAGING, TESTING) - I reviewed patient records, labs, notes, testing and imaging myself where available.  Lab Results  Component Value Date   WBC 3.3 (L) 09/15/2020   HGB 14.7 09/15/2020   HCT 43.7 09/15/2020   MCV 91.0 09/15/2020   PLT 239 09/15/2020      Component Value Date/Time   NA 141 09/15/2020 1120   NA 140 02/24/2020 1103   K 4.2 09/15/2020 1120   CL 101 09/15/2020 1120   CO2 30 09/15/2020 1120   GLUCOSE 89 09/15/2020 1120   BUN 20 09/15/2020 1120   BUN 26 02/24/2020 1103   CREATININE 0.75 09/15/2020 1120   CALCIUM 9.6 09/15/2020 1120   PROT 7.3 09/15/2020 1120   PROT 7.2 02/24/2020 1103   ALBUMIN 4.5 02/24/2020 1103   AST 19 09/15/2020 1120   ALT 16 09/15/2020 1120   ALKPHOS 99 02/24/2020 1103   BILITOT 0.6 09/15/2020 1120   BILITOT 0.4 02/24/2020 1103   GFRNONAA 86 09/15/2020 1120   GFRAA 100 09/15/2020 1120   Lab Results  Component Value Date   CHOL 213 (H) 09/15/2020   HDL 99 09/15/2020   LDLCALC 100 (H) 09/15/2020   TRIG 57 09/15/2020   CHOLHDL 2.2 09/15/2020   Lab Results  Component Value Date   HGBA1C  03/10/2010    5.5 (NOTE) The ADA recommends the following therapeutic goal for glycemic control related to Hgb A1c measurement: Goal of therapy: <6.5 Hgb A1c  Reference: American Diabetes Association: Clinical Practice Recommendations 2010, Diabetes Care, 2010, 33: (Suppl  1).      ASSESSMENT AND PLAN    1. Multiple  sclerosis (HCC)   2. Abnormality of gait   3. Urge incontinence   4. High risk medication use   5. Attention deficit disorder, unspecified hyperactivity presence   6. Chronic prescription opiate use   7. Dysesthesia      1.   Continue Tecfidera for now.  Has recent bloodwork.   MRI 02/2020 was stable.  Will check again in late 2022 (consider cervical spine also at that time) 2.   Name brand Ritalin and prn modafinil for attention deficit and fatigue.   3.   Continue med's.  The [place] controlled substance database was reviewed and she has been compliant.  She has not shown any drug-seeking behavior.  We discussed not taking more than the prescribed dose due to increased risk. 4.   Follow-up in 4 months or sooner if there are new or worsening neurologic symptoms.     Chaeli Judy A. Epimenio Foot, MD, PhD 11/16/2020, 10:57 AM Certified in Neurology, Clinical Neurophysiology, Sleep Medicine, Pain Medicine and Neuroimaging  [facility] [address] [place], [place] [address] [phone] ]

## 2020-12-08 ENCOUNTER — Other Ambulatory Visit: Payer: Self-pay | Admitting: Neurology

## 2020-12-08 ENCOUNTER — Other Ambulatory Visit: Payer: Self-pay

## 2020-12-08 MED ORDER — HYDROCODONE-ACETAMINOPHEN 7.5-325 MG PO TABS: ORAL_TABLET | ORAL | 0 refills | Status: DC

## 2020-12-08 MED ORDER — RITALIN 10 MG PO TABS
ORAL_TABLET | ORAL | 0 refills | Status: DC
Start: 2020-12-08 — End: 2021-01-06

## 2020-12-15 ENCOUNTER — Telehealth: Payer: Self-pay | Admitting: Neurology

## 2020-12-15 NOTE — Telephone Encounter (Signed)
Pt called, insurance denied paying for RITALIN 10 MG tablet,, will not cover name brand. Insurance said I could file an appeal letter through my physician. That will show that I need to stay on the name brand. Would like a call from the nurse.

## 2020-12-16 ENCOUNTER — Telehealth: Payer: Self-pay | Admitting: Emergency Medicine

## 2020-12-16 NOTE — Telephone Encounter (Signed)
Received denial fax from [employer]. [name] notified by phone of denial.  Stated she is calling her insurance rep back because they told her how to get it covered. Patient expressed appreciation.   Request Reference Number: [id number]. RITALIN TAB 10MG  is denied for not meeting the prior authorization requirement(s

## 2020-12-16 NOTE — Telephone Encounter (Signed)
Called and spoke with pt. Relayed MD message below. She would still like to try and appeal Ritalin to get it approved. [facility] still 186.35/unable to afford this.  She has tried many different generic brands of methylphenidate, ineffective. She briefly tried Adderall rx'd by PCP in the past but unsure if it was effective. Was not on it long enough. Would like to appeal Ritalin first, if denied, agreeable to try Adderall again at that point.

## 2020-12-16 NOTE — Telephone Encounter (Signed)
Dr. Epimenio Foot- did you want to try and appeal decision?

## 2020-12-16 NOTE — Telephone Encounter (Signed)
Ritalin 10 mg PA started on  CMM.  Key: [id number].  Awaiting determination from [employer]

## 2020-12-16 NOTE — Telephone Encounter (Signed)
Please let the patient know her insurance denying it.  Options are generic or if she prefers we could switch to Adderall (amphetamine/dextroamphetamine)

## 2020-12-16 NOTE — Telephone Encounter (Signed)
Called patient and she denies any change of insurance as patient has been on this medicaiton since 10/16/12.  Informed patient that I have submitted a PA for brand name medication.  Patient stated her insurance she has been on this medication since 2013 and should be covered.  Patient expressed appreciation.

## 2020-12-16 NOTE — Telephone Encounter (Signed)
Irma from [facility] called stating that they no longer cover Ritalin due to it being a plan exclusion medication. She sates that if the provider would like to appeal they would have to send in information on why the medication is medically necessary and if pt has tried other medication like Methylphenidate and have failed to work for her. Irma states that the appeal process takes about a month. If any questions please call provider line [phone] and Fax# for appeal is [phone]

## 2020-12-17 NOTE — Telephone Encounter (Signed)
Called pt to let her know we sent in appeal letter to [facility] at fax # she provided. We will let her know once we receive a determination from insurance. She verbalized understanding and appreciation.

## 2020-12-22 ENCOUNTER — Other Ambulatory Visit: Payer: Self-pay | Admitting: Neurology

## 2021-01-06 ENCOUNTER — Other Ambulatory Visit: Payer: Self-pay

## 2021-01-06 MED ORDER — HYDROCODONE-ACETAMINOPHEN 7.5-325 MG PO TABS: ORAL_TABLET | ORAL | 0 refills | Status: DC

## 2021-01-06 MED ORDER — RITALIN 10 MG PO TABS
ORAL_TABLET | ORAL | 0 refills | Status: DC
Start: 2021-01-06 — End: 2021-02-03

## 2021-01-20 NOTE — Telephone Encounter (Addendum)
Called [employer] at [phone]. Appeal upheld/denied on 12/19/20. Letter sent to pt on 12/22/20. Case # [id number]. She will fax denial to [phone]. Ref# for call: [id number]  I called pt. She is aware of this. She got a specific generic brand of methylphenidate. She has been on this for about a month. Would like to be on this for another month to determine how beneficial it is. She has been very fatigued since being on this. She is willing to try Adderall in the future if needed. She will call back in a month to update up on how she is feeling. Nothing further needed at this time.

## 2021-02-02 ENCOUNTER — Telehealth: Payer: Self-pay | Admitting: Neurology

## 2021-02-02 ENCOUNTER — Other Ambulatory Visit: Payer: Self-pay

## 2021-02-02 MED ORDER — HYDROCODONE-ACETAMINOPHEN 7.5-325 MG PO TABS: ORAL_TABLET | ORAL | 0 refills | Status: DC

## 2021-02-02 NOTE — Telephone Encounter (Signed)
She is on a high dose of Ritalin (60 mg a day).  Please let her know at her next refill, if she would like we could have her try Adderall (20 mg 3 times daily) instead of Ritalin to see if she does better.  (She cannot be on both medications at the same time).  If she has an ear infection, that may make her feel more tired.  We could check a vitamin D level

## 2021-02-02 NOTE — Telephone Encounter (Signed)
Called pt to further discuss. She reports being very fatigued. Unable to do anything right now. Denies fever. Has some ear issues/drainage but otherwise feels ok. Wondering if generic methylphenidate that she is taking is ineffective? Or should Vit D levels be checked? She is still taking supplement. Or should she come in for appt to see Dr. Epimenio Foot? Advised I will discuss with MD and call her back. I mentioned previous suggestion of changing her to adderall but she wants to know what Dr. Epimenio Foot thinks first.

## 2021-02-02 NOTE — Telephone Encounter (Signed)
Pt called wanting to discuss with RN about the change from her RITALIN 10 MG tablet to the Adderall. Also she is wanting to discuss about possibly getting some blood work done. Please advise.

## 2021-02-03 ENCOUNTER — Other Ambulatory Visit: Payer: Self-pay | Admitting: *Deleted

## 2021-02-03 DIAGNOSIS — G35 Multiple sclerosis: Secondary | ICD-10-CM

## 2021-02-03 DIAGNOSIS — Z79899 Other long term (current) drug therapy: Secondary | ICD-10-CM

## 2021-02-03 DIAGNOSIS — E559 Vitamin D deficiency, unspecified: Secondary | ICD-10-CM

## 2021-02-03 MED ORDER — AMPHETAMINE-DEXTROAMPHETAMINE 20 MG PO TABS: ORAL_TABLET | ORAL | 0 refills | Status: DC

## 2021-02-03 NOTE — Telephone Encounter (Addendum)
Called patient back. Relayed Dr. Bonnita Hollow message. She will come for further labs. Per Dr. Epimenio Foot, ok to order Vit D level and CBC w/ diff. Placed future orders. She is aware lab tech takes lunch from 12-1pm and to come no later than 430pm.  She would like to try Adderall as recommended. [facility] Advised I will send request to MD. I also recommended she f/u with PCP about continued ear pain/drainage to make sure she does not have an infection.

## 2021-02-03 NOTE — Addendum Note (Signed)
Addended by: Arther Abbott on: 02/03/2021 09:28 AM   Modules accepted: Orders

## 2021-02-03 NOTE — Addendum Note (Signed)
Addended by: Despina Arias A on: 02/03/2021 12:59 PM   Modules accepted: Orders

## 2021-02-08 ENCOUNTER — Telehealth: Payer: Self-pay | Admitting: Neurology

## 2021-02-08 NOTE — Telephone Encounter (Signed)
FYI

## 2021-02-08 NOTE — Telephone Encounter (Signed)
Noted  

## 2021-02-08 NOTE — Telephone Encounter (Signed)
Pt called, medication you prescribed is working and I'm feeling better. I can wait till my appt to do my bloodwork on

## 2021-02-18 ENCOUNTER — Telehealth: Payer: Self-pay | Admitting: Internal Medicine

## 2021-02-18 NOTE — Telephone Encounter (Signed)
Called patient to let her know she needs to do an at home COVID test, and call back with results prior to coming to appointment on 02/19/2021

## 2021-02-18 NOTE — Telephone Encounter (Signed)
Can she take a home Covid test? We don not have anything today but could see her tomorrow. Need results of home test first.

## 2021-02-18 NOTE — Telephone Encounter (Signed)
[name] [phone]  [name] called to say she is fatigued, ear hurts feels like swimmers ear, swollen glands in her neck and her throat is sore but it feels more swollen, No COVID vaccines, no exposure to COVID.

## 2021-02-19 ENCOUNTER — Other Ambulatory Visit: Payer: Self-pay

## 2021-02-19 ENCOUNTER — Ambulatory Visit: Payer: 59 | Admitting: Internal Medicine

## 2021-02-19 ENCOUNTER — Encounter: Payer: Self-pay | Admitting: Internal Medicine

## 2021-02-19 VITALS — BP 140/90 | HR 61 | Temp 97.7°F | Wt 150.0 lb

## 2021-02-19 DIAGNOSIS — H9203 Otalgia, bilateral: Secondary | ICD-10-CM

## 2021-02-19 DIAGNOSIS — H6503 Acute serous otitis media, bilateral: Secondary | ICD-10-CM

## 2021-02-19 DIAGNOSIS — R0989 Other specified symptoms and signs involving the circulatory and respiratory systems: Secondary | ICD-10-CM

## 2021-02-19 DIAGNOSIS — Z87898 Personal history of other specified conditions: Secondary | ICD-10-CM

## 2021-02-19 DIAGNOSIS — Z8679 Personal history of other diseases of the circulatory system: Secondary | ICD-10-CM | POA: Diagnosis not present

## 2021-02-19 DIAGNOSIS — G35 Multiple sclerosis: Secondary | ICD-10-CM | POA: Diagnosis not present

## 2021-02-19 DIAGNOSIS — G35D Multiple sclerosis, unspecified: Secondary | ICD-10-CM

## 2021-02-19 MED ORDER — METHYLPREDNISOLONE ACETATE 80 MG/ML IJ SUSP
80.0000 mg | Freq: Once | INTRAMUSCULAR | Status: AC
Start: 2021-02-19 — End: 2021-02-19
  Administered 2021-02-19: 80 mg via INTRAMUSCULAR

## 2021-02-19 NOTE — Progress Notes (Signed)
   Subjective:    Patient ID: [name], female    DOB: June 06, 1961, 60 y.o.   MRN: [id number]  HPI 60 year old Female with history of MS here with several complaints. For weeks have had respiratory congestion.  Has not been vaccinated for COVID-19.  Is sometimes around grandchildren but tries to keep distance.  Feels congestion in her ears.  Has had chest congestion.  Sometimes has palpitations.  Has history of mitral valve prolapse.  Is concerned about her heart.  Says her chest feels like she has pneumonia.  She looks fatigued.  Denies fever or shaking chills.  Says throat feels irritated.  Says Dr. Epimenio Foot, her Neurologist had her on Ritalin but she was not able to tolerate it.  It was a good idea for fatigue associated with multiple sclerosis.      Review of Systems see above     Objective:   Physical Exam  Blood pressure 140/90 pulse 61 temperature 97.7 degrees pulse oximetry 94% weight 150 pounds BMI 24.96.  Skin is pale warm and dry.  No cervical adenopathy.  No thyromegaly.  TMs are full bilaterally but are not red.  Neck is supple.  Chest is clear to auscultation without rales or wheezing.  Cardiac exam regular rate and rhythm.      Assessment & Plan:  Acute bilateral serous otitis media  Rule out COVID-19 or other respiratory virus-respiratory virus panel and COVID-19 test are pending.  She is not been vaccinated for COVID-19.  History of mitral valve prolapse-has metoprolol to take if having palpitations  Plan: I do not hear any significant chest congestion to warrant a chest x-ray.  She does usually respond well to Depo-Medrol 80 mg IM and an injection was given today.  Stay at home and rest.  Await test results.  Do not think antibiotic is indicated at this time.  Quarantine from others until test results are back.

## 2021-02-19 NOTE — Telephone Encounter (Signed)
Patient called back right after she left, she had forgot to mention she had found a tick on herself on 02/17/2021. She does not know how long it had been there, however she was already having all of her symptoms before this.

## 2021-02-19 NOTE — Addendum Note (Signed)
Addended by: Gregery Na on: 02/19/2021 12:20 PM   Modules accepted: Orders

## 2021-02-19 NOTE — Addendum Note (Signed)
Addended by: Gregery Na on: 02/19/2021 03:10 PM   Modules accepted: Orders

## 2021-02-19 NOTE — Telephone Encounter (Signed)
Patient let me know when she got here today that her home COVID test was negative.

## 2021-02-19 NOTE — Patient Instructions (Addendum)
Depomedrol 80 mg IM. Covid-19 and RSV panel are pending.  Quarantine until test results are back.

## 2021-02-20 LAB — SARS-COV-2 RNA,(COVID-19) QUALITATIVE NAAT: SARS CoV2 RNA: NOT DETECTED

## 2021-02-22 LAB — RESPIRATORY VIRUS PANEL

## 2021-03-04 ENCOUNTER — Other Ambulatory Visit: Payer: Self-pay

## 2021-03-04 MED ORDER — HYDROCODONE-ACETAMINOPHEN 7.5-325 MG PO TABS: ORAL_TABLET | ORAL | 0 refills | Status: DC

## 2021-03-04 MED ORDER — AMPHETAMINE-DEXTROAMPHETAMINE 20 MG PO TABS: ORAL_TABLET | ORAL | 0 refills | Status: DC

## 2021-03-08 ENCOUNTER — Telehealth: Payer: Self-pay | Admitting: Neurology

## 2021-03-08 MED ORDER — RITALIN 20 MG PO TABS: 20.0000 mg | ORAL_TABLET | Freq: Three times a day (TID) | ORAL | 0 refills | Status: DC

## 2021-03-08 NOTE — Telephone Encounter (Signed)
I called pt. She is agreeable to plan. If Ritalin not covered by insurance, she will use goodrx coupon.

## 2021-03-08 NOTE — Telephone Encounter (Signed)
Called pt back. Having SE on generic adderall. Getting [employer] brand. Took all throughout March. Having excruciating headaches, heart palpitations, upset stomach on it. She did feel like she was sick overall so she saw PCP.  Did not have covid. Found fluid build up behind ears. Treating for this.  She wants to get back on brand name Ritalin 10mg  #180 or change to 20mg  #90 (take 1/2 tablet). She would use goodrx coupon if needed and it would be cheaper this way. Advised I will discuss with MD and call her back with next steps. She verbalized understanding.

## 2021-03-08 NOTE — Telephone Encounter (Signed)
Spoke with Dr. Epimenio Foot. He is ok to prescribe brand Ritalin 20mg , directions: Take 1 by mouth three times daily #90.

## 2021-03-08 NOTE — Telephone Encounter (Signed)
Pt called, I picked up a prescription for Adderall. I have someone questions about the prescription. Would like a call from the nurse.

## 2021-03-18 ENCOUNTER — Telehealth: Payer: Self-pay | Admitting: Neurology

## 2021-03-18 NOTE — Telephone Encounter (Signed)
Called pt back. She was dx two days ago w. Covid-19. Had a viral infection for weeks leading up. Had fluid behind both eardrums per PCP. She then developed ear ache, jaw pain, headache, aches/pains, vomiting. Sx have improved now. Not as severe. She is taking tylenol/ibuprofen prn. Denies any fever. Unable to eat a lot right now. Trying to stay well hydrated. She still has taste/smell. Advised per MD that she can take Tecfidera. She should continue to monitor sx. If she has new/worsening sx, she should let PCP know. Advised her to call back if she has any more questions/concerns.

## 2021-03-18 NOTE — Telephone Encounter (Signed)
Pt called, tested positive for Covid. Are there medication I can not take due my MS or the medication Tecfidera. Would like a call from the nurse.

## 2021-03-31 ENCOUNTER — Encounter: Payer: Self-pay | Admitting: Family Medicine

## 2021-03-31 ENCOUNTER — Other Ambulatory Visit: Payer: Self-pay

## 2021-03-31 ENCOUNTER — Ambulatory Visit: Payer: 59 | Admitting: Family Medicine

## 2021-03-31 VITALS — BP 138/85 | HR 69 | Ht 65.0 in | Wt 148.0 lb

## 2021-03-31 DIAGNOSIS — E559 Vitamin D deficiency, unspecified: Secondary | ICD-10-CM

## 2021-03-31 DIAGNOSIS — Z79891 Long term (current) use of opiate analgesic: Secondary | ICD-10-CM

## 2021-03-31 DIAGNOSIS — G35 Multiple sclerosis: Secondary | ICD-10-CM

## 2021-03-31 DIAGNOSIS — E538 Deficiency of other specified B group vitamins: Secondary | ICD-10-CM

## 2021-03-31 DIAGNOSIS — Z79899 Other long term (current) drug therapy: Secondary | ICD-10-CM | POA: Diagnosis not present

## 2021-03-31 DIAGNOSIS — F988 Other specified behavioral and emotional disorders with onset usually occurring in childhood and adolescence: Secondary | ICD-10-CM | POA: Diagnosis not present

## 2021-03-31 NOTE — Patient Instructions (Signed)
Below is our plan:  We will continue current treatment plan.   Please make sure you are staying well hydrated. I recommend 50-60 ounces daily. Well balanced diet and regular exercise encouraged. Consistent sleep schedule with 6-8 hours recommended.   Please continue follow up with care team as directed.   Follow up with Dr [name] in 6 months   You may receive a survey regarding today's visit. I encourage you to leave honest feed back as I do use this information to improve patient care. Thank you for seeing me today!      Multiple Sclerosis Multiple sclerosis (MS) is a disease of the brain, spinal cord, and optic nerves (central nervous system). It causes the body's disease-fighting (immune) system to destroy the protective covering (myelin sheath) around nerves in the brain. When this happens, signals (nerve impulses) going to and from the brain and spinal cord do not get sent properly or may not get sent at all. There are several types of MS:  Relapsing-remitting MS. This is the most common type. This causes sudden attacks of symptoms. After an attack, you may recover completely until the next attack, or some symptoms may remain permanently.  Secondary progressive MS. This usually develops after the onset of relapsing-remitting MS. Similar to relapsing-remitting MS, this type also causes sudden attacks of symptoms. Attacks may be less frequent, but symptoms slowly get worse (progress) over time.  Primary progressive MS. This causes symptoms that steadily progress over time. This type of MS does not cause sudden attacks of symptoms. The age of onset of MS varies, but it often develops between 20-40 years of age. MS is a lifelong (chronic) condition. There is no cure, but treatment can help slow down the progression of the disease. What are the causes? The cause of this condition is not known. What increases the risk? You are more likely to develop this condition if:  You are a  woman.  You have a relative with MS. However, the condition is not passed from parent to child (inherited).  You have a lack (deficiency) of vitamin D.  You smoke. MS is more common in the [place] than in the [place]. What are the signs or symptoms? Relapsing-remitting and secondary progressive MS cause symptoms to occur in episodes or attacks that may last weeks to months. There may be long periods between attacks in which there are almost no symptoms. Primary progressive MS causes symptoms to steadily progress after they develop. Symptoms of MS vary because of the many different ways it affects the central nervous system. The main symptoms include:  Vision problems and eye pain.  Numbness and weakness.  Inability to move your arms, hands, feet, or legs (paralysis).  Balance problems.  Shaking that you cannot control (tremors).  Muscle spasms.  Problems with thinking (cognitive changes). MS can also cause symptoms that are associated with the disease, but are not always the direct result of an MS attack. They may include:  Inability to control urination or bowel movements (incontinence).  Headaches.  Fatigue.  Inability to tolerate heat.  Emotional changes.  Depression.  Pain. How is this diagnosed? This condition is diagnosed based on:  Your symptoms.  A neurological exam. This involves checking central nervous system function, such as nerve function, reflexes, and coordination.  MRIs of the brain and spinal cord.  Lab tests, including a lumbar puncture that tests the fluid that surrounds the brain and spinal cord (cerebrospinal fluid).  Tests to measure   the electrical activity of the brain in response to stimulation (evoked potentials). How is this treated? There is no cure for MS, but medicines can help decrease the number and frequency of attacks and help relieve nuisance symptoms. Treatment options may include:  Medicines that  reduce the frequency of attacks. These medicines may be given by injection, by mouth (orally), or through an IV.  Medicines that reduce inflammation (steroids). These may provide short-term relief of symptoms.  Medicines to help control pain, depression, fatigue, or incontinence.  Nutritional counseling. Vitamin D supplements, if you have a deficiency.  Using devices to help you move around (assistive devices), such as braces, a cane, or a walker.  Physical therapy to strengthen and stretch your muscles.  Occupational therapy to help you with everyday tasks.  Alternative or complementary treatments such as exercise, massage, or acupuncture.   Follow these instructions at home:  Take over-the-counter and prescription medicines only as told by your health care provider.  Do not drive or use heavy machinery while taking prescription pain medicine.  Use assistive devices as recommended by your physical therapist or your health care provider.  Exercise as directed by your health care provider.  Eating healthy can help manage MS symptoms.  Return to your normal activities as told by your health care provider. Ask your health care provider what activities are safe for you.  Reach out for support. Share your feelings with friends, family, or a support group.  Keep all follow-up visits as told by your health care provider and therapists. This is important. Where to find more information  National Multiple Sclerosis Society: https://www.nationalmssociety.org  National Institute of Neurological Disorders and Stroke: https://www.ninds.nih.gov  National Center for Complementary and Integrative Health: https://www.nccih.nih.gov/ Contact a health care provider if:  You feel depressed.  You develop new pain or numbness.  You have tremors.  You have problems with sexual function. Get help right away if:  You develop paralysis.  You develop numbness.  You have problems with your  bladder or bowel function.  You develop double vision.  You lose vision in one or both eyes.  You develop suicidal thoughts.  You develop severe confusion. If you ever feel like you may hurt yourself or others, or have thoughts about taking your own life, get help right away. You can go to your nearest emergency department or call:  Your local emergency services (911 in the [place]  A suicide crisis helpline, such as the National Suicide Prevention Lifeline at 1-800-273-8255. This is open 24 hours a day. Summary  Multiple sclerosis (MS) is a disease of the central nervous system that causes the body's immune system to destroy the protective covering (myelin sheath) around nerves in the brain.  There are 3 types of MS: relapsing-remitting, secondary progressive, and primary progressive. Relapsing-remitting and secondary progressive MS cause symptoms to occur in episodes or attacks that may last weeks to months. Primary progressive MS causes symptoms to steadily progress after they develop.  There is no cure for MS, but medicines can help decrease the number and frequency of attacks and help relieve nuisance symptoms. Treatment may also include physical or occupational therapy.  If you develop numbness, paralysis, vision problems, or other neurological symptoms, get help right away. This information is not intended to replace advice given to you by your health care provider. Make sure you discuss any questions you have with your health care provider. Document Revised: 09/01/2020 Document Reviewed: 09/01/2020 Elsevier Patient Education  2021 Elsevier Inc.  

## 2021-03-31 NOTE — Progress Notes (Signed)
Chief Complaint  Patient presents with  . Follow-up    RM 1 alone Pt is well, been sick last 3 months so MS hasn't been the best.      HISTORY OF PRESENT ILLNESS: 03/31/21 ALL:  [name] is a 60 y.o. female here today for follow up for RRMS. She continues Tecfidera and is tolerating it well. Labs have been stable. MRI 02/2020 stable.   She was diagnosed with Covid last month. Had been feeling badly for 2 months prior. No new or exacerbating symptoms. She has had more fatigue. She continues modafinil 200mg  daily and Ritalin 20mg  TID. She is not sure modafinil was causing back pain. Adderall did not help.  Pain controlled on gabapentin 600mg  BID PRN and hydrocodone 7.5-325mg  two tablets every 8 hours as needed. PDMP appropriate.   Hyoscyamine as needed helps with urinary frequency.   Alprazolam 0.5mg  up to TID as needed for anxiety and sleep.   She continues vitamin D and B12 supplements. She has been taking 50,000iu weekly of D and twice monthly of B12.    HISTORY (copied from Dr previous note)  Sweet Jarvis is a 60 y.o. woman with relapsing remitting multiple sclerosis diagnosed in 2008.    Update 11/16/2020: She has had more fatigue.   She felt better after a B12 injection (took early due to her symptoms).77   She was prescribed B12 every two weeks.  She had more fatigue also back in October when she had a UTI.   In June she had a cold and also felt very tired.     Modafinil helps fatigue but her back hurts more.  Ritalin helps her ADD and fatigue the most and name brand helps more than random generic but insurance will not always cover it.  One specific generic helped her as well but pharmacy can not always get it.   She is on 6 x 10 mg daily.     She is on Tecfidera and tolerates it well.   She feels more clumsy and much more fatigued.   She felt better on Tysabri but was JCV Ab positive.   Lymphocytes 09/15/2020 was good at 1.2.  LFTs were fine as  well.  She has right leg weakness and gat is off, sometimes dragging the right leg .   She tires out very easily.   She wakes up to urinate 3-4 times a night which is better.   She only takes hyoscyamine prn for bowel and we discussed it may help the bladder..   She wears a Poise pad due to occasional incontinence..    She usually falls back asleep easily.   She denies snoring.   She feels less depressed off any antidepressant  Ritalin (gets name brand 10 mg ) helps her fatigue and attention/focus some - she is taking up to 60 mg a day in split dose.   In the past, modafinil has helped but she often gets a muscular pain.      She had not felt better with Solu-Medrol in January 2019 (had a possible small exacerbation) though another time, she thinks she did feel better  She also has pain in her back.   Hydrocodone has helped (she takes 6 x 7.5 mg daily).  She is compliant (PDMP has been checked).  When pain flares, she feels her walking does worse.     Vit D has been low in the past but was 34.3 when last checked.  Vit B12 was also low in the past.        MS History:  In 2008, she had an MRI of the brain performed which was consistent with multiple sclerosis and she was diagnosed with relapsing remitting MS. He was initially placed on Avonex for therapy. She had some exacerbations with weakness and clumsiness with her gait and she started to see Dr. Leotis ShamesJeffery. He placed her on Tysabri she is on Tysabri between 2010 and 2013. 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   Imaging Review: 12/13/17 MRI of the cervical and thoracic spine:  She has multiple T2 hyperintense lesions in the spinal cord the largest to the right C2-C3 and smaller ones at C3-C4, C4, C5-C6, C6-C7 and T1-T2 all the foci were present compared to the 2016 MRI.   The MRI of the thoracic spine showed small foci adjacent to T2 and T5-6.  None  of the spinal plaques appeared to be acute.    MRI 02/25/2020 brain:   Multiple T2/FLAIR hyperintense foci in the hemispheres in a pattern and configuration consistent with chronic demyelinating plaque associated with multiple sclerosis. None of the foci appears to be acute. Compared to the MRI from 03/18/2017, there is no interval change.      REVIEW OF SYSTEMS: Out of a complete 14 system review of symptoms, the patient complains only of the following symptoms, chronic pain, fatigue, anxiety, insomnia and all other reviewed systems are negative.    ALLERGIES: Allergies  Allergen Reactions  . Ibuprofen Other (See Comments)    CAUSES LYMPHEDEMA  . Nsaids Other (See Comments)    AVOIDS ANY SODIUM CONTAINING MED. -- CAUSES LYMPHEDEMA  . Other Swelling and Other (See Comments)    Laxatives- causes leg swelling artificial sweeteners     HOME MEDICATIONS: Outpatient Medications Prior to Visit  Medication Sig Dispense Refill  . ALPRAZolam (XANAX) 0.5 MG tablet TAKE 1 TABLET BY MOUTH 3 TIMES A DAY AS NEEDED FOR ANXIETY/SLEEP 90 tablet 2  . cyanocobalamin (,VITAMIN B-12,) 1000 MCG/ML injection INJECT 1 ML TWICE PER MONTH 2 mL 11  . gabapentin (NEURONTIN) 600 MG tablet TAKE 1 TABLET (600 MG TOTAL) BY MOUTH 2 (TWO) TIMES DAILY. 180 tablet 3  . HYDROcodone-acetaminophen (NORCO) 7.5-325 MG tablet TAKE 2 TABLETS BY MOUTH EVERY 8 HOURS AS NEEDED 180 tablet 0  . hyoscyamine (LEVSIN SL) 0.125 MG SL tablet Place 1 tablet (0.125 mg total) under the tongue every 4 (four) hours as needed. 270 tablet 3  . metoprolol tartrate (LOPRESSOR) 50 MG tablet TAKE 1/2 TABLET BY MOUTH EVERY 12 HOURS AS NEEDED FOR PALPITATIONS 90 tablet 2  . RITALIN 20 MG tablet Take 1 tablet (20 mg total) by mouth 3 (three) times daily with meals. 90 tablet 0  . Syringe/Needle, Disp, (SYRINGE 3CC/23GX1") 23G X 1" 3 ML MISC 3 Syringes by Does not apply route every 30 (thirty) days. (Patient taking differently: 3 Syringes by Does  not apply route every 30 (thirty) days. 26Gx5/6") 3 each 3  . TECFIDERA 240 MG CPDR TAKE 1 CAPSULE (240MG ) BY  MOUTH TWICE DAILY 60 capsule 12  . Vitamin D, Ergocalciferol, (DRISDOL) 1.25 MG (50000 UNIT) CAPS capsule TAKE 1 CAPSULE (50,000 UNITS TOTAL) BY MOUTH EVERY 7 (SEVEN) DAYS. 13 capsule 1  . modafinil (PROVIGIL) 200 MG tablet Take 1 tablet (200 mg total) by mouth daily. (Patient not taking: No  sig reported) 30 tablet 5   No facility-administered medications prior to visit.     PAST MEDICAL HISTORY: Past Medical History:  Diagnosis Date  . ADD (attention deficit disorder)   . Anxiety   . Arthritis   . Bladder incontinence    wears a pad  . Bone spur    cervical bone spurs  . Chronic fatigue   . Dysrhythmia    hx A-FIB  . 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 Headache(784.0)   . History of concussion YOUNG ADULT--  NO RESIDUAL  . IBS (irritable bowel syndrome)   . Movement disorder   . MS (multiple sclerosis) (HCC)   . MVP (mitral valve prolapse) HX HEART RACING  YRS AGO   ASYMPTOMATIC SINCE THEN  . Vision abnormalities   . Weakness of right leg SECONDARY TO MS     PAST SURGICAL HISTORY: Past Surgical History:  Procedure Laterality Date  . CESAREAN SECTION  X3   BILATERAL TUBAL LIGATION W/ LAST ONE  . DESTRUCTION OF ANAL CONDYLOMA  07-07-2005  DR TOTH  . HERNIA REPAIR    . KNEE ARTHROSCOPY WITH LATERAL MENISECTOMY  10/26/2012   Procedure: KNEE ARTHROSCOPY WITH LATERAL MENISECTOMY;  Surgeon: Javier Docker, MD;  Location: [facility];  Service: Orthopedics;  Laterality: Left;  Left Knee Arthrsocopy with Partial Lateral Menisectomy  . LAPAROSCOPIC ASSISTED VAGINAL HYSTERECTOMY  01-15-2002  DR Gerlene Burdock HOLLAND/ DR TIM DAVIS   AND REPAIR VENTRAL HERNIA  . LASER ABLATION CONDOLAMATA N/A 04/26/2013   Procedure: LASER ABLATION CONDOLAMATA;  Surgeon: Romie Levee, MD;  [facility];  Service: General;  Laterality: N/A;  .  TONSILLECTOMY    . TOTAL KNEE ARTHROPLASTY Left 11/05/2015   Procedure: LEFT TOTAL KNEE ARTHROPLASTY;  Surgeon: Jene Every, MD;  Location: [facility];  Service: Orthopedics;  Laterality: Left;     FAMILY HISTORY: Family History  Problem Relation Age of Onset  . Diabetes Mother   . Hypertension Mother   . Stroke Mother   . Bowel Disease Brother   . Diabetes Brother   . Healthy Brother   . Emphysema Father   . Heart attack Father      SOCIAL HISTORY: Social History   Socioeconomic History  . Marital status: Married    Spouse name: Not on file  . Number of children: 3  . Years of education: Not on file  . Highest education level: Not on file  Occupational History  . Not on file  Tobacco Use  . Smoking status: Never Smoker  . Smokeless tobacco: Never Used  Substance and Sexual Activity  . Alcohol use: No  . Drug use: No  . Sexual activity: Not on file  Other Topics Concern  . Not on file  Social History Narrative  . Not on file   Social Determinants of Health   Financial Resource Strain: Not on file  Food Insecurity: Not on file  Transportation Needs: Not on file  Physical Activity: Not on file  Stress: Not on file  Social Connections: Not on file  Intimate Partner Violence: Not on file      PHYSICAL EXAM  Vitals:   03/31/21 1029  BP: 138/85  Pulse: 69  Weight: 148 lb (67.1 kg)  Height: 5\' 5"  (1.651 m)   Body mass index is 24.63 kg/m.   Generalized: Well developed, in no acute distress  Cardiology: normal rate and rhythm, no murmur auscultated  Respiratory: clear to auscultation  bilaterally    Neurological examination  Mentation: Alert oriented to time, place, history taking. Follows all commands speech and language fluent Cranial nerve II-XII: Pupils were equal round reactive to light. Extraocular movements were full, visual field were full on confrontational test. Facial sensation and strength were normal. Uvula tongue midline. Head turning and  shoulder shrug  were normal and symmetric. Motor: The motor testing reveals 5 over 5 strength of all 4 extremities. Tone increased in legs.  Sensory: Sensory testing is intact to soft touch on all 4 extremities. No evidence of extinction is noted.  Coordination: Cerebellar testing reveals good finger-nose-finger and heel-to-shin bilaterally.  Gait and station: Normal station, gait mildly wide, stable without assistive device. Difficulty with Tandem.   Reflexes: Deep tendon reflexes are symmetric and brisk bilaterally.     DIAGNOSTIC DATA (LABS, IMAGING, TESTING) - I reviewed patient records, labs, notes, testing and imaging myself where available.  Lab Results  Component Value Date   WBC 3.3 (L) 09/15/2020   HGB 14.7 09/15/2020   HCT 43.7 09/15/2020   MCV 91.0 09/15/2020   PLT 239 09/15/2020      Component Value Date/Time   NA 141 09/15/2020 1120   NA 140 02/24/2020 1103   K 4.2 09/15/2020 1120   CL 101 09/15/2020 1120   CO2 30 09/15/2020 1120   GLUCOSE 89 09/15/2020 1120   BUN 20 09/15/2020 1120   BUN 26 02/24/2020 1103   CREATININE 0.75 09/15/2020 1120   CALCIUM 9.6 09/15/2020 1120   PROT 7.3 09/15/2020 1120   PROT 7.2 02/24/2020 1103   ALBUMIN 4.5 02/24/2020 1103   AST 19 09/15/2020 1120   ALT 16 09/15/2020 1120   ALKPHOS 99 02/24/2020 1103   BILITOT 0.6 09/15/2020 1120   BILITOT 0.4 02/24/2020 1103   GFRNONAA 86 09/15/2020 1120   GFRAA 100 09/15/2020 1120   Lab Results  Component Value Date   CHOL 213 (H) 09/15/2020   HDL 99 09/15/2020   LDLCALC 100 (H) 09/15/2020   TRIG 57 09/15/2020   CHOLHDL 2.2 09/15/2020   Lab Results  Component Value Date   HGBA1C  03/10/2010    5.5 (NOTE) The ADA recommends the following therapeutic goal for glycemic control related to Hgb A1c measurement: Goal of therapy: <6.5 Hgb A1c  Reference: American Diabetes Association: Clinical Practice Recommendations 2010, Diabetes Care, 2010, 33: (Suppl  1).   Lab Results  Component  Value Date   VITAMINB12 936 11/19/2019   Lab Results  Component Value Date   TSH 1.61 09/15/2020    No flowsheet data found.   No flowsheet data found.   ASSESSMENT AND PLAN  60 y.o. year old female  has a past medical history of ADD (attention deficit disorder), Anxiety, Arthritis, Bladder incontinence, Bone spur, Chronic fatigue, Dysrhythmia, Generalized neuropathy, Headache(784.0), History of concussion (YOUNG ADULT--  NO RESIDUAL), IBS (irritable bowel syndrome), Movement disorder, MS (multiple sclerosis) (HCC), MVP (mitral valve prolapse) (HX HEART RACING  YRS AGO), Vision abnormalities, and Weakness of right leg (SECONDARY TO MS). here with   Multiple sclerosis (HCC) - Plan: CBC with Differential/Platelets, CMP  High risk medication use - Plan: CBC with Differential/Platelets, CMP  Chronic prescription opiate use  Attention deficit disorder, unspecified hyperactivity presence  Vitamin D deficiency - Plan: Vitamin D, 25-hydroxy  Vitamin B 12 deficiency - Plan: Vitamin B12  Lillieanna is doing fairly well, today. She has had more fatigue since over the past few months and was diagnosed with Covid 03/2021. No  new or exacerbating MS symptoms. We will continue current treatment plan. PDMP reviewed and appropriate. We will update labs, today. She will continue healthy lifestyle habits. She will follow up in 6 months with Dr Epimenio Foot.    Orders Placed This Encounter  Procedures  . CBC with Differential/Platelets  . CMP  . Vitamin D, 25-hydroxy  . Vitamin B12     No orders of the defined types were placed in this encounter.     I spent 30 minutes of face-to-face and non-face-to-face time with patient.  This included previsit chart review, lab review, study review, order entry, electronic health record documentation, patient education.    Shawnie Dapper, MSN, FNP-C 03/31/2021, 11:14 AM  [facility] [address], [address] [place], [place] [address] [phone]

## 2021-03-31 NOTE — Progress Notes (Signed)
I have read the note, and I agree with the clinical assessment and plan.  Anderson Coppock A. Kennetha Pearman, MD, PhD, FAAN Certified in Neurology, Clinical Neurophysiology, Sleep Medicine, Pain Medicine and Neuroimaging  [facility] [address], [address] [place], [place] [address] [phone]  

## 2021-04-01 LAB — CBC WITH DIFFERENTIAL/PLATELET
Basophils Absolute: 0 10*3/uL (ref 0.0–0.2)
Basos: 1 %
EOS (ABSOLUTE): 0 10*3/uL (ref 0.0–0.4)
Eos: 0 %
Hematocrit: 38.6 % (ref 34.0–46.6)
Hemoglobin: 13.1 g/dL (ref 11.1–15.9)
Immature Grans (Abs): 0 10*3/uL (ref 0.0–0.1)
Immature Granulocytes: 0 %
Lymphocytes Absolute: 1.2 10*3/uL (ref 0.7–3.1)
Lymphs: 26 %
MCH: 30.3 pg (ref 26.6–33.0)
MCHC: 33.9 g/dL (ref 31.5–35.7)
MCV: 89 fL (ref 79–97)
Monocytes Absolute: 0.5 10*3/uL (ref 0.1–0.9)
Monocytes: 11 %
Neutrophils Absolute: 2.9 10*3/uL (ref 1.4–7.0)
Neutrophils: 62 %
Platelets: 328 10*3/uL (ref 150–450)
RBC: 4.32 x10E6/uL (ref 3.77–5.28)
RDW: 12.8 % (ref 11.7–15.4)
WBC: 4.7 10*3/uL (ref 3.4–10.8)

## 2021-04-01 LAB — VITAMIN D 25 HYDROXY (VIT D DEFICIENCY, FRACTURES): Vit D, 25-Hydroxy: 30.6 ng/mL (ref 30.0–100.0)

## 2021-04-01 LAB — COMPREHENSIVE METABOLIC PANEL
ALT: 20 IU/L (ref 0–32)
AST: 15 IU/L (ref 0–40)
Albumin/Globulin Ratio: 1.4 (ref 1.2–2.2)
Albumin: 4.1 g/dL (ref 3.8–4.8)
Alkaline Phosphatase: 79 IU/L (ref 44–121)
BUN/Creatinine Ratio: 22 (ref 12–28)
BUN: 19 mg/dL (ref 8–27)
Bilirubin Total: 0.6 mg/dL (ref 0.0–1.2)
CO2: 27 mmol/L (ref 20–29)
Calcium: 9.2 mg/dL (ref 8.7–10.3)
Chloride: 105 mmol/L (ref 96–106)
Creatinine, Ser: 0.86 mg/dL (ref 0.57–1.00)
Globulin, Total: 3 g/dL (ref 1.5–4.5)
Glucose: 97 mg/dL (ref 65–99)
Potassium: 4 mmol/L (ref 3.5–5.2)
Sodium: 141 mmol/L (ref 134–144)
Total Protein: 7.1 g/dL (ref 6.0–8.5)
eGFR: 76 mL/min/{1.73_m2} (ref >59)

## 2021-04-01 LAB — VITAMIN B12: Vitamin B-12: 1532 pg/mL — ABNORMAL HIGH (ref 232–1245)

## 2021-04-01 MED ORDER — HYDROCODONE-ACETAMINOPHEN 7.5-325 MG PO TABS: ORAL_TABLET | ORAL | 0 refills | Status: DC

## 2021-04-01 MED ORDER — RITALIN 20 MG PO TABS: 20.0000 mg | ORAL_TABLET | Freq: Three times a day (TID) | ORAL | 0 refills | Status: DC

## 2021-04-07 ENCOUNTER — Telehealth: Payer: Self-pay | Admitting: *Deleted

## 2021-04-07 NOTE — Telephone Encounter (Signed)
Submitted PA Ritalin on CMM. Key: [id number]. Waiting on determination from [facility].

## 2021-04-08 NOTE — Telephone Encounter (Signed)
PA denied. Pt was going to use goodrx coupon if PA denied.

## 2021-04-19 ENCOUNTER — Telehealth: Payer: Self-pay | Admitting: Family Medicine

## 2021-04-19 NOTE — Telephone Encounter (Signed)
Pt has called to report that the reaction she is having to the RITALIN 20 MG tablet is that is makes her tired instead of alert.  Pt is asking for a call to discuss.

## 2021-04-19 NOTE — Telephone Encounter (Signed)
I called pt and she states that since she prescribed the brand name ritalin 20mg  tid.  She had gotten 10mg  at her pharmacy the first time in April since they did not have 20mg  tabs in stock.   In May she started the 20 mg tabs  and she states it is not working for her. it is like she is taking Xanax to her that drugged feeling.  The pharmacy states it could be the fillers, the dye, the sugar.  She would like to go back taking the 10 mg tablets (taking 40mg   then 20mg )  and needs a prescription. Ok to do?    drug registry checked last fill 04-07-2021 #[id number]

## 2021-04-20 MED ORDER — METHYLPHENIDATE HCL 10 MG PO TABS: 20.0000 mg | ORAL_TABLET | Freq: Three times a day (TID) | ORAL | 0 refills | Status: DC

## 2021-04-20 NOTE — Telephone Encounter (Signed)
I called pt and relayed the message that ok to do 10mg  tablets, and take 1/2 tablet of the 20mg  tabs to see if makes difference, she stated has been taking less, but still feels drugged, no energy.  She will wait till next month to get the 10mg  tabs.

## 2021-04-20 NOTE — Addendum Note (Signed)
Addended by: Shawnie Dapper L on: 04/20/2021 09:50 AM   Modules accepted: Orders

## 2021-04-20 NOTE — Addendum Note (Signed)
Addended by: Guy Begin on: 04/20/2021 09:33 AM   Modules accepted: Orders

## 2021-04-24 ENCOUNTER — Other Ambulatory Visit: Payer: Self-pay | Admitting: Neurology

## 2021-04-28 ENCOUNTER — Other Ambulatory Visit: Payer: Self-pay

## 2021-04-28 MED ORDER — HYDROCODONE-ACETAMINOPHEN 7.5-325 MG PO TABS: ORAL_TABLET | ORAL | 0 refills | Status: DC

## 2021-05-13 ENCOUNTER — Telehealth: Payer: Self-pay | Admitting: Family Medicine

## 2021-05-13 NOTE — Telephone Encounter (Signed)
[facility] Booty) called, wanted to inform you patient's insurance also covers the generic brand for TECFIDERA 240 MG CPDR  If ok with patient we prescribe generic brand.

## 2021-05-17 NOTE — Telephone Encounter (Signed)
I called patient she states that she has not heard from [facility] about changing to generic.  She will call because she is due for refill.  She states she is on patient assistance and was paying she said $50 for the last 2 months as a co-pay for this .  She has an appointment scheduled October with Dr. Epimenio Foot.  She will let us know if she we need to do anything after she speaks with them.

## 2021-05-27 ENCOUNTER — Other Ambulatory Visit: Payer: Self-pay

## 2021-05-27 MED ORDER — HYDROCODONE-ACETAMINOPHEN 7.5-325 MG PO TABS: ORAL_TABLET | ORAL | 0 refills | Status: DC

## 2021-06-22 ENCOUNTER — Other Ambulatory Visit: Payer: Self-pay | Admitting: Family Medicine

## 2021-06-22 MED ORDER — METHYLPHENIDATE HCL 10 MG PO TABS: 20.0000 mg | ORAL_TABLET | Freq: Three times a day (TID) | ORAL | 0 refills | Status: DC

## 2021-06-30 ENCOUNTER — Other Ambulatory Visit: Payer: Self-pay

## 2021-06-30 MED ORDER — HYDROCODONE-ACETAMINOPHEN 7.5-325 MG PO TABS: ORAL_TABLET | ORAL | 0 refills | Status: DC

## 2021-07-19 ENCOUNTER — Other Ambulatory Visit: Payer: Self-pay | Admitting: Family Medicine

## 2021-07-19 ENCOUNTER — Telehealth: Payer: Self-pay | Admitting: Family Medicine

## 2021-07-19 MED ORDER — AMPHETAMINE-DEXTROAMPHETAMINE 20 MG PO TABS: ORAL_TABLET | ORAL | 0 refills | Status: DC

## 2021-07-19 NOTE — Telephone Encounter (Signed)
Called and reviewed with the patient that Amy sent in the one month of adderall for her. Advised that in the upcoming apt with Dr Epimenio Foot she should discuss changing to a medication that may be more cost affordable that way not having to switch back and forth. Pt verbalized understanding.

## 2021-07-19 NOTE — Telephone Encounter (Signed)
Called the patient back to discuss her concern. She states the states  Ritalin is costing her 200$ a month and it is becoming hard to afford.  She is asking if Amy is willing to call in 1 month of the adderall (generic 20 mg IR TID) which is what she has taken before to hold her over until she can afford the cost of the medication Ritalin. I informed her I would have to question this with Amy and call back with her recommendation.

## 2021-07-19 NOTE — Telephone Encounter (Signed)
Pt is unable to afford the brand version for methylphenidate (RITALIN) 10 MG tablet, she is asking for a call to discuss generic or other options for this month.

## 2021-07-26 ENCOUNTER — Other Ambulatory Visit: Payer: Self-pay

## 2021-07-28 MED ORDER — HYDROCODONE-ACETAMINOPHEN 7.5-325 MG PO TABS: ORAL_TABLET | ORAL | 0 refills | Status: DC

## 2021-07-28 NOTE — Telephone Encounter (Signed)
Received refill request for Hydrocodone-acetaminophen. Last OV was on 03/31/21.  Next OV is scheduled for 09/22/21 .  Last RX was written on 06/30/21 for 180 tabs.   [place] Drug Database has been reviewed.

## 2021-08-17 ENCOUNTER — Other Ambulatory Visit: Payer: Self-pay | Admitting: Family Medicine

## 2021-08-17 MED ORDER — AMPHETAMINE-DEXTROAMPHETAMINE 20 MG PO TABS: ORAL_TABLET | ORAL | 0 refills | Status: DC

## 2021-08-25 ENCOUNTER — Other Ambulatory Visit: Payer: Self-pay

## 2021-08-26 MED ORDER — HYDROCODONE-ACETAMINOPHEN 7.5-325 MG PO TABS: ORAL_TABLET | ORAL | 0 refills | Status: DC

## 2021-09-13 ENCOUNTER — Other Ambulatory Visit: Payer: Self-pay | Admitting: Neurology

## 2021-09-13 ENCOUNTER — Telehealth: Payer: Self-pay | Admitting: Family Medicine

## 2021-09-13 DIAGNOSIS — G35 Multiple sclerosis: Secondary | ICD-10-CM

## 2021-09-13 MED ORDER — TECFIDERA 240 MG PO CPDR
DELAYED_RELEASE_CAPSULE | ORAL | 1 refills | Status: DC
Start: 2021-09-13 — End: 2021-11-02

## 2021-09-13 NOTE — Telephone Encounter (Signed)
E-scribed refill 

## 2021-09-13 NOTE — Telephone Encounter (Signed)
Pt has called for a refill on her TECFIDERA 240 MG CPDR to [facility]

## 2021-09-15 ENCOUNTER — Other Ambulatory Visit: Payer: Self-pay | Admitting: Family Medicine

## 2021-09-15 MED ORDER — AMPHETAMINE-DEXTROAMPHETAMINE 20 MG PO TABS: ORAL_TABLET | ORAL | 0 refills | Status: DC

## 2021-09-22 ENCOUNTER — Ambulatory Visit: Payer: 59 | Admitting: Neurology

## 2021-09-22 ENCOUNTER — Telehealth: Payer: Self-pay | Admitting: Neurology

## 2021-09-22 NOTE — Telephone Encounter (Signed)
FYI- pt had to reschedule her appt today, she was having a hard time this morning due to her MS.

## 2021-09-22 NOTE — Telephone Encounter (Signed)
Noted. Pt r/s to 10/14/21

## 2021-09-23 ENCOUNTER — Other Ambulatory Visit: Payer: Self-pay

## 2021-09-24 MED ORDER — HYDROCODONE-ACETAMINOPHEN 7.5-325 MG PO TABS: ORAL_TABLET | ORAL | 0 refills | Status: DC

## 2021-10-14 ENCOUNTER — Other Ambulatory Visit: Payer: Self-pay | Admitting: Neurology

## 2021-10-14 ENCOUNTER — Ambulatory Visit: Payer: 59 | Admitting: Neurology

## 2021-10-14 ENCOUNTER — Encounter: Payer: Self-pay | Admitting: Neurology

## 2021-10-14 VITALS — BP 150/89 | HR 73 | Ht 65.0 in | Wt 134.5 lb

## 2021-10-14 DIAGNOSIS — Z79899 Other long term (current) drug therapy: Secondary | ICD-10-CM | POA: Diagnosis not present

## 2021-10-14 DIAGNOSIS — F988 Other specified behavioral and emotional disorders with onset usually occurring in childhood and adolescence: Secondary | ICD-10-CM | POA: Diagnosis not present

## 2021-10-14 DIAGNOSIS — R269 Unspecified abnormalities of gait and mobility: Secondary | ICD-10-CM

## 2021-10-14 DIAGNOSIS — Z79891 Long term (current) use of opiate analgesic: Secondary | ICD-10-CM

## 2021-10-14 DIAGNOSIS — G35 Multiple sclerosis: Secondary | ICD-10-CM | POA: Diagnosis not present

## 2021-10-14 DIAGNOSIS — E559 Vitamin D deficiency, unspecified: Secondary | ICD-10-CM

## 2021-10-14 DIAGNOSIS — N3941 Urge incontinence: Secondary | ICD-10-CM

## 2021-10-14 DIAGNOSIS — R5382 Chronic fatigue, unspecified: Secondary | ICD-10-CM

## 2021-10-14 MED ORDER — AMPHETAMINE-DEXTROAMPHETAMINE 20 MG PO TABS: ORAL_TABLET | ORAL | 0 refills | Status: DC

## 2021-10-14 NOTE — Addendum Note (Signed)
Addended by: Melanee Spry on: 10/14/2021 03:42 PM   Modules accepted: Orders

## 2021-10-14 NOTE — Telephone Encounter (Signed)
Received refill request for Adderall.  Last OV was on 10/14/21.  Next OV is scheduled for 02/24/22 .  Last RX was written on 09/15/21 for 90 tabs.   [place] Drug Database has been reviewed.

## 2021-10-14 NOTE — Progress Notes (Signed)
[facility]  PATIENT: [name] DOB: 27-Aug-1961 REASON FOR VISIT: Multiple sclerosis   HISTORICAL  CHIEF COMPLAINT:  Chief Complaint  Patient presents with   Follow-up    Rm 1, alone. Here for 6 month MS f/u, on Tecfidera and tolerating well. Pt reports having extreme fatigue. Pt reports doing well during the summer. Changed her diet. Eating more vegetables. Pt noted she has eat before taking Adderall to avoid HA.     HISTORY OF PRESENT ILLNESS:  [name] is a 60 y.o. woman with relapsing remitting multiple sclerosis diagnosed in 2008.    Update 10/14/21: She is on Tecfidera and tolerates it well.   She feels more clumsy and much more fatigued.   She felt better on Tysabri but was JCV Ab positive.   Lymphocytes 03/31/2021  was good at 1.2.  LFTs were fine   She feels her MS is stable.   She notes gait is worse but has more right knee issues as well.  Right leg is weaker than left and she has a foot drop at times.  She tires out very easily.   She wakes up to urinate 3-4 times a night which is better.    She wears a Poise pad due to occasional incontinence..    She usually falls back asleep easily.   She denies snoring.   She feels less depressed off any antidepressant  She has had more fatigue. Brand name Ritalin helps her ADD the most but is not covered.    Adderall 20 mg po tid works better than methylphenidate.     She has pain in her back and spasms in back and legs, helped by hydrocodone (she takes 6 x 7.5 mg daily).  She is compliant (PDMP has been checked).  When pain flares, she feels her walking does worse.     Vit D 50000 units causes heart racing but lower dose daily is tolerated.       Vit B12 was also low in the past.        MS History:  In 2008, she had an MRI of the brain performed which was consistent with multiple sclerosis and she was diagnosed with relapsing remitting MS. He was initially placed on Avonex for therapy. She had some  exacerbations with weakness and clumsiness with her gait and she started to see Dr. Leotis Shames. He placed her on Tysabri she is on Tysabri between 2010 and 2013. 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   Imaging Review: 12/13/17 MRI of the cervical and thoracic spine:  She has multiple T2 hyperintense lesions in the spinal cord the largest to the right C2-C3 and smaller ones at C3-C4, C4, C5-C6, C6-C7 and T1-T2 all the foci were present compared to the 2016 MRI.   The MRI of the thoracic spine showed small foci adjacent to T2 and T5-6.  None of the spinal plaques appeared to be acute.    MRI 02/25/2020 brain:   Multiple T2/FLAIR hyperintense foci in the hemispheres in a pattern and configuration consistent with chronic demyelinating plaque associated with multiple sclerosis.  None of the foci appears to be acute.  Compared to the MRI from 03/18/2017, there is no interval change.   MRI 2016, 2017 and 2018 showed no new lesions  REVIEW OF SYSTEMS:  Constitutional: No fevers, chills, sweats, or change in appetite.  She notes fatigue and attention deficit Eyes: No visual changes,eye pain.   She has intermittent diplopia Ear, nose and throat: No hearing loss, ear pain, nasal congestion, sore throat Cardiovascular: No chest pain, palpitations Respiratory:  No shortness of breath at rest or with exertion.   No wheezes GastrointestinaI: No nausea, vomiting, diarrhea, abdominal pain, fecal incontinence Genitourinary:  a sabove Musculoskeletal:  she has both neck pain and back pain. Also bilat knee pain Integumentary: No rash, pruritus, skin lesions.   She has Raynauds in her hands  Neurological: as above Psychiatric: No depression at this time.  Anxiety doing ok on med's Endocrine: No palpitations, diaphoresis, change in appetite, change in weigh or increased thirst Hematologic/Lymphatic:  No anemia, purpura,  petechiae. Allergic/Immunologic: No itchy/runny eyes, nasal congestion, recent allergic reactions, rashes  ALLERGIES: Allergies  Allergen Reactions   Ibuprofen Other (See Comments)    CAUSES LYMPHEDEMA   Nsaids Other (See Comments)    AVOIDS ANY SODIUM CONTAINING MED. -- CAUSES LYMPHEDEMA   Other Swelling and Other (See Comments)    Laxatives- causes leg swelling artificial sweeteners    HOME MEDICATIONS: Outpatient Medications Prior to Visit  Medication Sig Dispense Refill   ALPRAZolam (XANAX) 0.5 MG tablet TAKE 1 TABLET BY MOUTH 3 TIMES A DAY AS NEEDED FOR ANXIETY/SLEEP 90 tablet 2   amphetamine-dextroamphetamine (ADDERALL) 20 MG tablet Take 1 tablet by mouth three times daily 90 tablet 0   cyanocobalamin (,VITAMIN B-12,) 1000 MCG/ML injection INJECT 1 ML TWICE PER MONTH 2 mL 11   gabapentin (NEURONTIN) 600 MG tablet TAKE 1 TABLET (600 MG TOTAL) BY MOUTH 2 (TWO) TIMES DAILY. 180 tablet 3   HYDROcodone-acetaminophen (NORCO) 7.5-325 MG tablet TAKE 2 TABLETS BY MOUTH EVERY 8 HOURS AS NEEDED 180 tablet 0   hyoscyamine (LEVSIN SL) 0.125 MG SL tablet Place 1 tablet (0.125 mg total) under the tongue every 4 (four) hours as needed. 270 tablet 3   metoprolol tartrate (LOPRESSOR) 50 MG tablet TAKE 1/2 TABLET BY MOUTH EVERY 12 HOURS AS NEEDED FOR PALPITATIONS 90 tablet 2   Syringe/Needle, Disp, (SYRINGE 3CC/23GX1") 23G X 1" 3 ML MISC 3 Syringes by Does not apply route every 30 (thirty) days. (Patient taking differently: 3 Syringes by Does not apply route every 30 (thirty) days. 26Gx5/6") 3 each 3   TECFIDERA 240 MG CPDR TAKE 1 CAPSULE (240MG ) BY  MOUTH TWICE DAILY 60 capsule 1   Vitamin D, Ergocalciferol, (DRISDOL) 1.25 MG (50000 UNIT) CAPS capsule TAKE 1 CAPSULE (50,000 UNITS TOTAL) BY MOUTH EVERY 7 (SEVEN) DAYS 13 capsule 1   modafinil (PROVIGIL) 200 MG tablet Take 1 tablet (200 mg total) by mouth daily. (Patient not taking: No sig reported) 30 tablet 5   No facility-administered medications  prior to visit.    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   Headache(784.0)    History of concussion YOUNG ADULT--  NO RESIDUAL   IBS (irritable bowel syndrome)    Movement disorder    MS (multiple sclerosis) (HCC)    MVP (mitral valve prolapse) HX HEART RACING  YRS AGO   ASYMPTOMATIC SINCE THEN   Vision abnormalities  Weakness of right leg SECONDARY TO MS    PAST SURGICAL HISTORY: Past Surgical History:  Procedure Laterality Date   CESAREAN SECTION  X3   BILATERAL TUBAL LIGATION W/ LAST ONE   DESTRUCTION OF ANAL CONDYLOMA  07-07-2005  DR TOTH   HERNIA REPAIR     KNEE ARTHROSCOPY WITH LATERAL MENISECTOMY  10/26/2012   Procedure: KNEE ARTHROSCOPY WITH LATERAL MENISECTOMY;  Surgeon: Javier Docker, MD;  Location: [facility];  Service: Orthopedics;  Laterality: Left;  Left Knee Arthrsocopy with Partial Lateral Menisectomy   LAPAROSCOPIC ASSISTED VAGINAL HYSTERECTOMY  01-15-2002  DR Gerlene Burdock HOLLAND/ DR TIM DAVIS   AND REPAIR VENTRAL HERNIA   LASER ABLATION CONDOLAMATA N/A 04/26/2013   Procedure: LASER ABLATION CONDOLAMATA;  Surgeon: Romie Levee, MD;  Location: [facility];  Service: General;  Laterality: N/A;   TONSILLECTOMY     TOTAL KNEE ARTHROPLASTY Left 11/05/2015   Procedure: LEFT TOTAL KNEE ARTHROPLASTY;  Surgeon: Jene Every, MD;  Location: [facility];  Service: Orthopedics;  Laterality: Left;    FAMILY HISTORY: Family History  Problem Relation Age of Onset   Diabetes Mother    Hypertension Mother    Stroke Mother    Bowel Disease Brother    Diabetes Brother    Healthy Brother    Emphysema Father    Heart attack Father     SOCIAL HISTORY:  Social History   Socioeconomic History    Marital status: Married    Spouse name: Not on file   Number of children: 3   Years of education: Not on file   Highest education level: Not on file  Occupational History   Not on file  Tobacco Use   Smoking status: Never   Smokeless tobacco: Never  Substance and Sexual Activity   Alcohol use: No   Drug use: No   Sexual activity: Not on file  Other Topics Concern   Not on file  Social History Narrative   Not on file   Social Determinants of Health   Financial Resource Strain: Not on file  Food Insecurity: Not on file  Transportation Needs: Not on file  Physical Activity: Not on file  Stress: Not on file  Social Connections: Not on file  Intimate Partner Violence: Not on file     PHYSICAL EXAM  Vitals:   10/14/21 1409  BP: (!) 150/89  Pulse: 73  Weight: 134 lb 8 oz (61 kg)  Height: 5\' 5"  (1.651 m)    Body mass index is 22.38 kg/m.   General: The patient is well-developed and well-nourished and in no acute distress   Neurologic Exam  Mental status: The patient is alert and oriented x 3 at the time of the examination. The patient has apparent normal recent and remote memory, with an apparently normal attention span and concentration ability.   Speech is normal.  Cranial nerves: The extraocular muscles are intact.  Normal facial strength.    Voice is clear.  Hearing appears normal.  Motor:  Muscle bulk is normal. Tone is mildly increased in her legs.. Strength is 5/5  Coordination: Cerebellar testing shows mild right reduced heel to shin.  Finger to nose was normal today  Gait and station: Station is normal. Gait is wide.  Tandem gait is poor.   .   The Romberg is borderline.  Reflexes: Deep tendon reflexes are symmetric and brisk bilaterally with crossed abductors at the knees, worse on right. No ankle clonus.Marland Kitchen  DIAGNOSTIC DATA (LABS, IMAGING, TESTING) - I reviewed patient records, labs, notes, testing and imaging myself where available.  Lab  Results  Component Value Date   WBC 4.7 03/31/2021   HGB 13.1 03/31/2021   HCT 38.6 03/31/2021   MCV 89 03/31/2021   PLT 328 03/31/2021      Component Value Date/Time   NA 141 03/31/2021 1056   K 4.0 03/31/2021 1056   CL 105 03/31/2021 1056   CO2 27 03/31/2021 1056   GLUCOSE 97 03/31/2021 1056   GLUCOSE 89 09/15/2020 1120   BUN 19 03/31/2021 1056   CREATININE 0.86 03/31/2021 1056   CREATININE 0.75 09/15/2020 1120   CALCIUM 9.2 03/31/2021 1056   PROT 7.1 03/31/2021 1056   ALBUMIN 4.1 03/31/2021 1056   AST 15 03/31/2021 1056   ALT 20 03/31/2021 1056   ALKPHOS 79 03/31/2021 1056   BILITOT 0.6 03/31/2021 1056   GFRNONAA 86 09/15/2020 1120   GFRAA 100 09/15/2020 1120   Lab Results  Component Value Date   CHOL 213 (H) 09/15/2020   HDL 99 09/15/2020   LDLCALC 100 (H) 09/15/2020   TRIG 57 09/15/2020   CHOLHDL 2.2 09/15/2020   Lab Results  Component Value Date   HGBA1C  03/10/2010    5.5 (NOTE) The ADA recommends the following therapeutic goal for glycemic control related to Hgb A1c measurement: Goal of therapy: <6.5 Hgb A1c  Reference: American Diabetes Association: Clinical Practice Recommendations 2010, Diabetes Care, 2010, 33: (Suppl  1).      ASSESSMENT AND PLAN    1. Multiple sclerosis (HCC)   2. High risk medication use   3. Chronic prescription opiate use   4. Attention deficit disorder, unspecified hyperactivity presence   5. Abnormality of gait   6. Chronic fatigue   7. Vitamin D deficiency   8. Urge incontinence       1.   Continue Tecfidera for now.   MRI 02/2020 was stable.  Around time of next visit, will check MRI of the brain and cervical spine 2.  Continue Adderall for attention deficit and fatigue.  Renew 3.   Continue hydrocodone for spine and spasm pain.  The [place] controlled substance database was reviewed and she has been compliant.  She has not shown any drug-seeking behavior.  We discussed not taking more than the prescribed dose  due to increased risk.   Check UDS today 4.   Follow-up in 4 months or sooner if there are new or worsening neurologic symptoms.     Zakayla Martinec A. Epimenio Foot, MD, PhD 10/14/2021, 2:38 PM Certified in Neurology, Clinical Neurophysiology, Sleep Medicine, Pain Medicine and Neuroimaging  [facility] [address], [address] [place], [place] [address] [phone] ]

## 2021-10-14 NOTE — Telephone Encounter (Signed)
Pt is requesting a refill of her amphetamine-dextroamphetamine (ADDERALL) 20 MG tablet.

## 2021-10-15 LAB — CBC WITH DIFFERENTIAL/PLATELET
Basophils Absolute: 0 10*3/uL (ref 0.0–0.2)
Basos: 0 %
EOS (ABSOLUTE): 0 10*3/uL (ref 0.0–0.4)
Eos: 1 %
Hematocrit: 39.6 % (ref 34.0–46.6)
Hemoglobin: 13.4 g/dL (ref 11.1–15.9)
Immature Grans (Abs): 0 10*3/uL (ref 0.0–0.1)
Immature Granulocytes: 0 %
Lymphocytes Absolute: 1.8 10*3/uL (ref 0.7–3.1)
Lymphs: 38 %
MCH: 30.4 pg (ref 26.6–33.0)
MCHC: 33.8 g/dL (ref 31.5–35.7)
MCV: 90 fL (ref 79–97)
Monocytes Absolute: 0.5 10*3/uL (ref 0.1–0.9)
Monocytes: 10 %
Neutrophils Absolute: 2.4 10*3/uL (ref 1.4–7.0)
Neutrophils: 51 %
Platelets: 251 10*3/uL (ref 150–450)
RBC: 4.41 x10E6/uL (ref 3.77–5.28)
RDW: 12.4 % (ref 11.7–15.4)
WBC: 4.7 10*3/uL (ref 3.4–10.8)

## 2021-10-18 ENCOUNTER — Other Ambulatory Visit: Payer: Self-pay | Admitting: Internal Medicine

## 2021-10-21 ENCOUNTER — Other Ambulatory Visit: Payer: Self-pay | Admitting: Neurology

## 2021-10-23 ENCOUNTER — Other Ambulatory Visit: Payer: Self-pay | Admitting: Neurology

## 2021-10-23 LAB — DRUG SCREEN, UR (12+OXYCODONE+CRT)
Amphetamine Scrn, Ur: POSITIVE — AB
BARBITURATE SCREEN URINE: NEGATIVE ng/mL
BENZODIAZEPINE SCREEN, URINE: NEGATIVE ng/mL
CANNABINOIDS UR QL SCN: NEGATIVE ng/mL
Cocaine (Metab) Scrn, Ur: NEGATIVE ng/mL
Creatinine(Crt), U: 145.6 mg/dL (ref 20.0–300.0)
Fentanyl, Urine: NEGATIVE pg/mL
Meperidine Screen, Urine: NEGATIVE ng/mL
Methadone Screen, Urine: NEGATIVE ng/mL
OXYCODONE+OXYMORPHONE UR QL SCN: NEGATIVE ng/mL
Opiate Scrn, Ur: POSITIVE ng/mL — AB
Ph of Urine: 5.8 (ref 4.5–8.9)
Phencyclidine Qn, Ur: NEGATIVE ng/mL
Propoxyphene Scrn, Ur: NEGATIVE ng/mL
SPECIFIC GRAVITY: 1.023
Tramadol Screen, Urine: NEGATIVE ng/mL

## 2021-10-25 ENCOUNTER — Other Ambulatory Visit: Payer: Self-pay | Admitting: Neurology

## 2021-10-25 MED ORDER — HYDROCODONE-ACETAMINOPHEN 7.5-325 MG PO TABS: ORAL_TABLET | ORAL | 0 refills | Status: DC

## 2021-10-25 NOTE — Telephone Encounter (Signed)
Received refill request for Norco 7.5-325mg .  Last OV was on 10/14/21.  Next OV is scheduled for 02/24/22 .  Last RX was written on 09/24/21 for 180 tabs.   [place] Drug Database has been reviewed.

## 2021-10-25 NOTE — Addendum Note (Signed)
Addended by: Arther Abbott on: 10/25/2021 11:49 AM   Modules accepted: Orders

## 2021-10-25 NOTE — Telephone Encounter (Signed)
Pt requesting refill for HYDROcodone-acetaminophen (NORCO) 7.5-325 MG tablet. Pharmacy [phone] - [place], [place] - [address]

## 2021-11-02 ENCOUNTER — Other Ambulatory Visit: Payer: Self-pay

## 2021-11-02 DIAGNOSIS — G35 Multiple sclerosis: Secondary | ICD-10-CM

## 2021-11-02 MED ORDER — TECFIDERA 240 MG PO CPDR: DELAYED_RELEASE_CAPSULE | ORAL | 11 refills | Status: DC

## 2021-11-12 ENCOUNTER — Other Ambulatory Visit: Payer: Self-pay | Admitting: Neurology

## 2021-11-15 MED ORDER — AMPHETAMINE-DEXTROAMPHETAMINE 20 MG PO TABS
ORAL_TABLET | ORAL | 0 refills | Status: DC
Start: 2021-11-15 — End: 2022-02-16

## 2021-11-15 NOTE — Telephone Encounter (Signed)
Patient is up to date on her appointments. Patient is due for a refill on adderall. [place] Controlled Substance Registry checked and is appropriate.  

## 2021-11-24 ENCOUNTER — Other Ambulatory Visit: Payer: Self-pay | Admitting: Neurology

## 2021-11-24 MED ORDER — HYDROCODONE-ACETAMINOPHEN 7.5-325 MG PO TABS
ORAL_TABLET | ORAL | 0 refills | Status: DC
Start: 2021-11-24 — End: 2021-12-22

## 2021-11-24 NOTE — Telephone Encounter (Signed)
Received refill request for Norco 7.5mg -325mg . Last OV was on 10/14/21.  Next OV is scheduled for 02/24/22 .  Last RX was written on 10/25/21 for 180 tabs.   [place] Drug Database has been reviewed.  Please fill as work in 10/27/21.

## 2021-12-10 ENCOUNTER — Other Ambulatory Visit: Payer: Self-pay | Admitting: Neurology

## 2021-12-22 ENCOUNTER — Other Ambulatory Visit: Payer: Self-pay | Admitting: Psychiatry

## 2021-12-23 MED ORDER — HYDROCODONE-ACETAMINOPHEN 7.5-325 MG PO TABS: ORAL_TABLET | ORAL | 0 refills | Status: DC

## 2022-01-11 ENCOUNTER — Observation Stay (HOSPITAL_BASED_OUTPATIENT_CLINIC_OR_DEPARTMENT_OTHER)
Admission: EM | Admit: 2022-01-11 | Discharge: 2022-01-13 | Disposition: A | Discharge status: Home / Self Care | Payer: 59 | Attending: Family Medicine | Admitting: Family Medicine

## 2022-01-11 ENCOUNTER — Other Ambulatory Visit: Payer: Self-pay

## 2022-01-11 ENCOUNTER — Telehealth: Payer: Self-pay | Admitting: Internal Medicine

## 2022-01-11 ENCOUNTER — Emergency Department (HOSPITAL_BASED_OUTPATIENT_CLINIC_OR_DEPARTMENT_OTHER): Payer: 59 | Admitting: Radiology

## 2022-01-11 DIAGNOSIS — J189 Pneumonia, unspecified organism: Secondary | ICD-10-CM | POA: Diagnosis not present

## 2022-01-11 DIAGNOSIS — Z96652 Presence of left artificial knee joint: Secondary | ICD-10-CM | POA: Insufficient documentation

## 2022-01-11 DIAGNOSIS — Z20822 Contact with and (suspected) exposure to covid-19: Secondary | ICD-10-CM | POA: Diagnosis not present

## 2022-01-11 DIAGNOSIS — Z7960 Long term (current) use of unspecified immunomodulators and immunosuppressants: Secondary | ICD-10-CM | POA: Insufficient documentation

## 2022-01-11 DIAGNOSIS — Z79891 Long term (current) use of opiate analgesic: Secondary | ICD-10-CM | POA: Diagnosis not present

## 2022-01-11 DIAGNOSIS — R059 Cough, unspecified: Secondary | ICD-10-CM | POA: Diagnosis present

## 2022-01-11 DIAGNOSIS — Z8701 Personal history of pneumonia (recurrent): Secondary | ICD-10-CM | POA: Insufficient documentation

## 2022-01-11 DIAGNOSIS — R918 Other nonspecific abnormal finding of lung field: Secondary | ICD-10-CM | POA: Insufficient documentation

## 2022-01-11 DIAGNOSIS — Z8249 Family history of ischemic heart disease and other diseases of the circulatory system: Secondary | ICD-10-CM | POA: Diagnosis not present

## 2022-01-11 DIAGNOSIS — G35 Multiple sclerosis: Secondary | ICD-10-CM | POA: Diagnosis not present

## 2022-01-11 DIAGNOSIS — I700 Atherosclerosis of aorta: Secondary | ICD-10-CM | POA: Insufficient documentation

## 2022-01-11 DIAGNOSIS — Z79899 Other long term (current) drug therapy: Secondary | ICD-10-CM | POA: Diagnosis not present

## 2022-01-11 DIAGNOSIS — R2689 Other abnormalities of gait and mobility: Secondary | ICD-10-CM | POA: Diagnosis not present

## 2022-01-11 DIAGNOSIS — N3289 Other specified disorders of bladder: Secondary | ICD-10-CM | POA: Diagnosis not present

## 2022-01-11 DIAGNOSIS — R1310 Dysphagia, unspecified: Secondary | ICD-10-CM | POA: Insufficient documentation

## 2022-01-11 DIAGNOSIS — M6281 Muscle weakness (generalized): Secondary | ICD-10-CM | POA: Insufficient documentation

## 2022-01-11 LAB — CBC WITH DIFFERENTIAL/PLATELET
Abs Immature Granulocytes: 0.02 10*3/uL (ref 0.00–0.07)
Basophils Absolute: 0 10*3/uL (ref 0.0–0.1)
Basophils Relative: 0 %
Eosinophils Absolute: 0 10*3/uL (ref 0.0–0.5)
Eosinophils Relative: 0 %
HCT: 42.6 % (ref 36.0–46.0)
Hemoglobin: 13.9 g/dL (ref 12.0–15.0)
Immature Granulocytes: 0 %
Lymphocytes Relative: 19 %
Lymphs Abs: 1.7 10*3/uL (ref 0.7–4.0)
MCH: 30 pg (ref 26.0–34.0)
MCHC: 32.6 g/dL (ref 30.0–36.0)
MCV: 91.8 fL (ref 80.0–100.0)
Monocytes Absolute: 0.8 10*3/uL (ref 0.1–1.0)
Monocytes Relative: 9 %
Neutro Abs: 6.4 10*3/uL (ref 1.7–7.7)
Neutrophils Relative %: 72 %
Platelets: 253 10*3/uL (ref 150–400)
RBC: 4.64 MIL/uL (ref 3.87–5.11)
RDW: 12.9 % (ref 11.5–15.5)
WBC: 9 10*3/uL (ref 4.0–10.5)
nRBC: 0 % (ref 0.0–0.2)

## 2022-01-11 LAB — URINALYSIS, ROUTINE W REFLEX MICROSCOPIC
Bilirubin Urine: NEGATIVE
Glucose, UA: NEGATIVE mg/dL
Hgb urine dipstick: NEGATIVE
Ketones, ur: 40 mg/dL — AB
Leukocytes,Ua: NEGATIVE
Nitrite: NEGATIVE
Protein, ur: 30 mg/dL — AB
Specific Gravity, Urine: 1.036 — ABNORMAL HIGH (ref 1.005–1.030)
pH: 6 (ref 5.0–8.0)

## 2022-01-11 LAB — COMPREHENSIVE METABOLIC PANEL
ALT: 25 U/L (ref 0–44)
AST: 28 U/L (ref 15–41)
Albumin: 4.5 g/dL (ref 3.5–5.0)
Alkaline Phosphatase: 87 U/L (ref 38–126)
Anion gap: 9 (ref 5–15)
BUN: 13 mg/dL (ref 8–23)
CO2: 31 mmol/L (ref 22–32)
Calcium: 9.8 mg/dL (ref 8.9–10.3)
Chloride: 98 mmol/L (ref 98–111)
Creatinine, Ser: 0.68 mg/dL (ref 0.44–1.00)
GFR, Estimated: >60 mL/min (ref >60)
Glucose, Bld: 101 mg/dL — ABNORMAL HIGH (ref 70–99)
Potassium: 3.9 mmol/L (ref 3.5–5.1)
Sodium: 138 mmol/L (ref 135–145)
Total Bilirubin: 0.8 mg/dL (ref 0.3–1.2)
Total Protein: 8.4 g/dL — ABNORMAL HIGH (ref 6.5–8.1)

## 2022-01-11 LAB — LACTIC ACID, PLASMA: Lactic Acid, Venous: 0.7 mmol/L (ref 0.5–1.9)

## 2022-01-11 LAB — PROTIME-INR
INR: 1 (ref 0.8–1.2)
Prothrombin Time: 12.8 seconds (ref 11.4–15.2)

## 2022-01-11 LAB — RESP PANEL BY RT-PCR (FLU A&B, COVID) ARPGX2
Influenza A by PCR: NEGATIVE
Influenza B by PCR: NEGATIVE
SARS Coronavirus 2 by RT PCR: NEGATIVE

## 2022-01-11 MED ORDER — ONDANSETRON HCL 4 MG/2ML IJ SOLN
4.0000 mg | Freq: Once | INTRAMUSCULAR | Status: AC
  Administered 2022-01-11: 4 mg via INTRAVENOUS
  Filled 2022-01-11: qty 2

## 2022-01-11 MED ORDER — SODIUM CHLORIDE 0.9 % IV SOLN
2.0000 g | INTRAVENOUS | Status: DC
  Administered 2022-01-12: 2 g via INTRAVENOUS
  Filled 2022-01-11: qty 20

## 2022-01-11 MED ORDER — SODIUM CHLORIDE 0.9 % IV SOLN
500.0000 mg | Freq: Once | INTRAVENOUS | Status: AC
  Administered 2022-01-11: 500 mg via INTRAVENOUS
  Filled 2022-01-11: qty 5

## 2022-01-11 MED ORDER — SODIUM CHLORIDE 0.9 % IV SOLN
1.0000 g | Freq: Once | INTRAVENOUS | Status: AC
  Administered 2022-01-11: 1 g via INTRAVENOUS
  Filled 2022-01-11: qty 10

## 2022-01-11 MED ORDER — SODIUM CHLORIDE 0.9 % IV SOLN
500.0000 mg | INTRAVENOUS | Status: DC
  Administered 2022-01-12: 500 mg via INTRAVENOUS
  Filled 2022-01-11: qty 5

## 2022-01-11 MED ORDER — ENOXAPARIN SODIUM 40 MG/0.4ML IJ SOSY
40.0000 mg | PREFILLED_SYRINGE | INTRAMUSCULAR | Status: DC
  Filled 2022-01-11: qty 0.4

## 2022-01-11 NOTE — Telephone Encounter (Signed)
Called [name] and she stated since she had a fever she thought he would want PCP to handle her being sick to rule out infection. She will call his office now.

## 2022-01-11 NOTE — ED Notes (Signed)
Pt states fever x 1 week , denies cough, states fever has  been high at 102 ,  last tylenol was 12 noon today , the fever  has exacerbated her MS symptoms and made her miserable,

## 2022-01-11 NOTE — ED Provider Notes (Signed)
[facility] EMERGENCY DEPT Provider Note   CSN: [id number] Arrival date & time: 01/11/22  1544     History  Chief Complaint  Patient presents with   Fever    [name] is a 61 y.o. female.  61 year old female presents with 1 week of fever.  Patient has history of MS and does take immunosuppressants.  Has had some pain with inspiration but denies any cough.  Has been medicating with Tylenol.  No vomiting or diarrhea.  No abdominal or chest discomfort.  Does note some bladder spasms which she states happens when her MS flares up due to a fever.  Does have history of pneumonia      Home Medications Prior to Admission medications   Medication Sig Start Date End Date Taking? Authorizing Provider  amphetamine-dextroamphetamine (ADDERALL) 20 MG tablet Take 1 tablet by mouth three times daily 11/15/21   [name], Nanine Means, MD  ALPRAZolam Duanne Moron) 0.5 MG tablet TAKE 1 TABLET BY MOUTH 3 TIMES A DAY AS NEEDED FOR ANXIETY/SLEEP 09/26/19   [name], Nanine Means, MD  cyanocobalamin (,VITAMIN B-12,) 1000 MCG/ML injection INJECT 1 ML TWICE PER MONTH 12/10/21   [name], Nanine Means, MD  gabapentin (NEURONTIN) 600 MG tablet TAKE 1 TABLET (600 MG TOTAL) BY MOUTH 2 (TWO) TIMES DAILY. 07/13/18   [name], Nanine Means, MD  HYDROcodone-acetaminophen (NORCO) 7.5-325 MG tablet TAKE 2 TABLETS BY MOUTH EVERY 8 HOURS AS NEEDED 12/23/21   [name], Nanine Means, MD  hyoscyamine (LEVSIN SL) 0.125 MG SL tablet Place 1 tablet (0.125 mg total) under the tongue every 4 (four) hours as needed. 11/16/20   [name], Nanine Means, MD  metoprolol tartrate (LOPRESSOR) 50 MG tablet TAKE 1/2 TABLET BY MOUTH EVERY 12 HOURS AS NEEDED FOR PALPITATIONS 10/18/21   Elby Showers, MD  Syringe/Needle, Disp, (SYRINGE 3CC/23GX1") 23G X 1" 3 ML MISC 3 Syringes by Does not apply route every 30 (thirty) days. Patient taking differently: 3 Syringes by Does not apply route every 30 (thirty) days. 26Gx5/6" 10/30/17   Britt Bottom, MD  TECFIDERA 240  MG CPDR TAKE 1 CAPSULE (240MG ) BY  MOUTH TWICE DAILY 11/02/21   [name], Nanine Means, MD  Vitamin D, Ergocalciferol, (DRISDOL) 1.25 MG (50000 UNIT) CAPS capsule TAKE 1 CAPSULE (50,000 UNITS TOTAL) BY MOUTH EVERY 7 (SEVEN) DAYS 04/26/21   Lomax, Amy, NP      Allergies    Ibuprofen, Nsaids, and Other    Review of Systems   Review of Systems  All other systems reviewed and are negative.  Physical Exam Updated Vital Signs BP (!) 142/101 (BP Location: Left Arm)    Pulse 73    Temp 98 F (36.7 C) (Oral)    Resp (!) 21    Ht 1.651 m (5\' 5" )    Wt 61 kg    SpO2 98%    BMI 22.38 kg/m  Physical Exam Vitals and nursing note reviewed.  Constitutional:      General: She is not in acute distress.    Appearance: Normal appearance. She is well-developed. She is not toxic-appearing.  HENT:     Head: Normocephalic and atraumatic.  Eyes:     General: Lids are normal.     Conjunctiva/sclera: Conjunctivae normal.     Pupils: Pupils are equal, round, and reactive to light.  Neck:     Thyroid: No thyroid mass.     Trachea: No tracheal deviation.  Cardiovascular:     Rate and Rhythm: Normal rate and regular rhythm.  Heart sounds: Normal heart sounds. No murmur heard.   No gallop.  Pulmonary:     Effort: Pulmonary effort is normal. No respiratory distress.     Breath sounds: Normal breath sounds. No stridor. No decreased breath sounds, wheezing, rhonchi or rales.  Abdominal:     General: There is no distension.     Palpations: Abdomen is soft.     Tenderness: There is no abdominal tenderness. There is no rebound.  Musculoskeletal:        General: No tenderness. Normal range of motion.     Cervical back: Normal range of motion and neck supple.  Skin:    General: Skin is warm and dry.     Findings: No abrasion or rash.  Neurological:     Mental Status: She is alert and oriented to person, place, and time. Mental status is at baseline.     GCS: GCS eye subscore is 4. GCS verbal subscore is 5. GCS  motor subscore is 6.     Cranial Nerves: No cranial nerve deficit.     Sensory: No sensory deficit.     Motor: Motor function is intact.  Psychiatric:        Attention and Perception: Attention normal.        Speech: Speech normal.        Behavior: Behavior normal.    ED Results / Procedures / Treatments   Labs (all labs ordered are listed, but only abnormal results are displayed) Labs Reviewed  COMPREHENSIVE METABOLIC PANEL - Abnormal; Notable for the following components:      Result Value   Glucose, Bld 101 (*)    Total Protein 8.4 (*)    All other components within normal limits  RESP PANEL BY RT-PCR (FLU A&B, COVID) ARPGX2  CULTURE, BLOOD (ROUTINE X 2)  CULTURE, BLOOD (ROUTINE X 2)  LACTIC ACID, PLASMA  CBC WITH DIFFERENTIAL/PLATELET  PROTIME-INR  LACTIC ACID, PLASMA  URINALYSIS, ROUTINE W REFLEX MICROSCOPIC    EKG None  Radiology DG Chest 2 View  Result Date: 01/11/2022 CLINICAL DATA:  Suspected sepsis EXAM: CHEST - 2 VIEW COMPARISON:  05/23/2017 FINDINGS: Subtle infiltrate in the right mid lung possibly with cavitation. This was not present previously. Left lung is clear. No effusion Cardiac and mediastinal contours normal.  Normal vascularity. IMPRESSION: Ill-defined airspace density right mid lung with possible cavitation. Given history this is likely pneumonia. Followup PA and lateral chest X-ray is recommended in 3-4 weeks following trial of antibiotic therapy to ensure resolution and exclude underlying malignancy. Electronically Signed   By: Franchot Gallo M.D.   On: 01/11/2022 16:32    Procedures Procedures    Medications Ordered in ED Medications  cefTRIAXone (ROCEPHIN) 1 g in sodium chloride 0.9 % 100 mL IVPB (has no administration in time range)  azithromycin (ZITHROMAX) 500 mg in sodium chloride 0.9 % 250 mL IVPB (has no administration in time range)    ED Course/ Medical Decision Making/ A&P                           Medical Decision Making Amount  and/or Complexity of Data Reviewed Labs: ordered. Radiology: ordered.   Old records reviewed.  Visit done with husband in the room.  Chest x-ray consistent with pneumonia per my review.  Patient started on IV antibiotics at this time.  Will require hospitalization.  Patient agreeable to this        Final Clinical Impression(s) /  ED Diagnoses Final diagnoses:  None    Rx / DC Orders ED Discharge Orders     None         Lacretia Leigh, MD 01/11/22 1710

## 2022-01-11 NOTE — Telephone Encounter (Signed)
[name] [phone]  Willadene called to say she is having a MS Flare up, fever is getting up to 103 in the wee hours of the morning for the last 8 days, body aches and pains, increased incontinence, lesions in spine acting up. Would like to come in this afternoon.

## 2022-01-11 NOTE — ED Notes (Signed)
Doug called with CL for consult for hosp. Per Dr Freida Busman 17:38

## 2022-01-11 NOTE — ED Triage Notes (Signed)
Patient here POV from Home with Fever.  Patient has had a Fever for approximately 1 week. Last Fever was at 0730 this AM and was 102.  No Symptoms apart from Usual Symptoms.  Patient has MS and takes Tecfidera. PCP instructed Patient to come to ED for Evaluation and Blood Specimen Collection.   NAD noted during Triage. A&Ox4. GCS 15. Ambulatory.

## 2022-01-12 ENCOUNTER — Observation Stay (HOSPITAL_COMMUNITY): Payer: 59

## 2022-01-12 ENCOUNTER — Encounter (HOSPITAL_COMMUNITY): Payer: Self-pay | Admitting: Internal Medicine

## 2022-01-12 DIAGNOSIS — Z79891 Long term (current) use of opiate analgesic: Secondary | ICD-10-CM | POA: Diagnosis not present

## 2022-01-12 DIAGNOSIS — G35 Multiple sclerosis: Secondary | ICD-10-CM

## 2022-01-12 DIAGNOSIS — J189 Pneumonia, unspecified organism: Secondary | ICD-10-CM | POA: Diagnosis not present

## 2022-01-12 LAB — BASIC METABOLIC PANEL
Anion gap: 8 (ref 5–15)
BUN: 16 mg/dL (ref 8–23)
CO2: 27 mmol/L (ref 22–32)
Calcium: 8.3 mg/dL — ABNORMAL LOW (ref 8.9–10.3)
Chloride: 101 mmol/L (ref 98–111)
Creatinine, Ser: 0.63 mg/dL (ref 0.44–1.00)
GFR, Estimated: >60 mL/min (ref >60)
Glucose, Bld: 115 mg/dL — ABNORMAL HIGH (ref 70–99)
Potassium: 3.3 mmol/L — ABNORMAL LOW (ref 3.5–5.1)
Sodium: 136 mmol/L (ref 135–145)

## 2022-01-12 LAB — CBC
HCT: 34.5 % — ABNORMAL LOW (ref 36.0–46.0)
Hemoglobin: 11.4 g/dL — ABNORMAL LOW (ref 12.0–15.0)
MCH: 30.6 pg (ref 26.0–34.0)
MCHC: 33 g/dL (ref 30.0–36.0)
MCV: 92.7 fL (ref 80.0–100.0)
Platelets: 230 10*3/uL (ref 150–400)
RBC: 3.72 MIL/uL — ABNORMAL LOW (ref 3.87–5.11)
RDW: 12.8 % (ref 11.5–15.5)
WBC: 7.7 10*3/uL (ref 4.0–10.5)
nRBC: 0 % (ref 0.0–0.2)

## 2022-01-12 LAB — MRSA NEXT GEN BY PCR, NASAL: MRSA by PCR Next Gen: NOT DETECTED

## 2022-01-12 LAB — HIV ANTIBODY (ROUTINE TESTING W REFLEX): HIV Screen 4th Generation wRfx: NONREACTIVE

## 2022-01-12 LAB — ALBUMIN: Albumin: 3.1 g/dL — ABNORMAL LOW (ref 3.5–5.0)

## 2022-01-12 LAB — STREP PNEUMONIAE URINARY ANTIGEN: Strep Pneumo Urinary Antigen: NEGATIVE

## 2022-01-12 MED ORDER — AMPHETAMINE-DEXTROAMPHETAMINE 10 MG PO TABS
20.0000 mg | ORAL_TABLET | Freq: Three times a day (TID) | ORAL | Status: DC
  Administered 2022-01-12 – 2022-01-13 (×3): 20 mg via ORAL
  Filled 2022-01-12 (×3): qty 2

## 2022-01-12 MED ORDER — IOHEXOL 300 MG/ML  SOLN
75.0000 mL | Freq: Once | INTRAMUSCULAR | Status: AC | PRN
  Administered 2022-01-12: 75 mL via INTRAVENOUS

## 2022-01-12 MED ORDER — POTASSIUM CHLORIDE CRYS ER 20 MEQ PO TBCR
40.0000 meq | EXTENDED_RELEASE_TABLET | Freq: Once | ORAL | Status: AC
  Administered 2022-01-12: 40 meq via ORAL
  Filled 2022-01-12: qty 2

## 2022-01-12 MED ORDER — HYDROCODONE-ACETAMINOPHEN 7.5-325 MG PO TABS
1.0000 | ORAL_TABLET | Freq: Four times a day (QID) | ORAL | Status: DC | PRN
  Administered 2022-01-13: 1 via ORAL
  Filled 2022-01-12: qty 1

## 2022-01-12 MED ORDER — SODIUM CHLORIDE (PF) 0.9 % IJ SOLN
INTRAMUSCULAR | Status: AC
  Filled 2022-01-12: qty 50

## 2022-01-12 MED ORDER — GUAIFENESIN ER 600 MG PO TB12: 600.0000 mg | ORAL_TABLET | Freq: Two times a day (BID) | ORAL | Status: DC | PRN

## 2022-01-12 MED ORDER — ALPRAZOLAM 0.5 MG PO TABS: 0.5000 mg | ORAL_TABLET | Freq: Three times a day (TID) | ORAL | Status: DC | PRN

## 2022-01-12 NOTE — Progress Notes (Signed)
Transition of Care [facility]) Screening Note  Patient Details  Name: [name] Date of Birth: [birth year]  Transition of Care [facility]) CM/SW Contact:    Ewing Schlein, LCSW Phone Number: 01/12/2022, 10:43 AM  Transition of Care Department [facility]) has reviewed patient and no TOC needs have been identified at this time. We will continue to monitor patient advancement through interdisciplinary progression rounds. If new patient transition needs arise, please place a TOC consult.

## 2022-01-12 NOTE — H&P (Signed)
History and Physical    Patient: [name] BJY:[id number] DOB: 1961/07/10 DOA: 01/11/2022 DOS: the patient was seen and examined on 01/12/2022 PCP: [name], MD  Patient coming from: Home  Chief Complaint:  Chief Complaint  Patient presents with   Fever    HPI: [name] is a 61 y.o. female with medical history significant of MS, on immunosuppressive treatment.  Pt presents to ED at [facility] with c/o cough, pain with inspiration.  Symptoms ongoing for the past ~8 days.  Associated fever at home.  No vomiting, diarrhea, CP.  Review of Systems: As mentioned in the history of present illness. All other systems reviewed and are negative. 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   Headache(784.0)    History of concussion YOUNG ADULT--  NO RESIDUAL   IBS (irritable bowel syndrome)    Movement disorder    MS (multiple sclerosis) (HCC)    MVP (mitral valve prolapse) HX HEART RACING  YRS AGO   ASYMPTOMATIC SINCE THEN   Vision abnormalities    Weakness of right leg SECONDARY TO MS   Past Surgical History:  Procedure Laterality Date   CESAREAN SECTION  X3   BILATERAL TUBAL LIGATION W/ LAST ONE   DESTRUCTION OF ANAL CONDYLOMA  07-07-2005  DR TOTH   HERNIA REPAIR     KNEE ARTHROSCOPY WITH LATERAL MENISECTOMY  10/26/2012   Procedure: KNEE ARTHROSCOPY WITH LATERAL MENISECTOMY;  Surgeon: Javier Docker, MD;  Location: [facility];  Service: Orthopedics;  Laterality: Left;  Left Knee Arthrsocopy with Partial Lateral Menisectomy   LAPAROSCOPIC ASSISTED VAGINAL HYSTERECTOMY  01-15-2002  DR Gerlene Burdock HOLLAND/ DR TIM DAVIS   AND REPAIR VENTRAL HERNIA   LASER ABLATION CONDOLAMATA N/A 04/26/2013   Procedure: LASER ABLATION CONDOLAMATA;  Surgeon:  Romie Levee, MD;  Location: [facility] ;  Service: General;  Laterality: N/A;   TONSILLECTOMY     TOTAL KNEE ARTHROPLASTY Left 11/05/2015   Procedure: LEFT TOTAL KNEE ARTHROPLASTY;  Surgeon: Jene Every, MD;  Location: [facility];  Service: Orthopedics;  Laterality: Left;   Social History:  reports that she has never smoked. She has never used smokeless tobacco. She reports that she does not drink alcohol and does not use drugs.  Allergies  Allergen Reactions   Ibuprofen Other (See Comments)    CAUSES LYMPHEDEMA   Nsaids Other (See Comments)    AVOIDS ANY SODIUM-CONTAINING MED. -- CAUSES LYMPHEDEMA   Other Swelling and Other (See Comments)    Laxatives and artificial sweeteners- cause leg swelling   Vitamin D Analogs Palpitations and Other (See Comments)    Cannot tolerate at 50,000 units OR even 2,000 units    Family History  Problem Relation Age of Onset   Diabetes Mother    Hypertension Mother    Stroke Mother    Bowel Disease Brother    Diabetes Brother    Healthy Brother    Emphysema Father    Heart attack Father     Prior to Admission medications   Medication Sig Start Date End Date Taking? Authorizing Provider  ALPRAZolam (XANAX) 0.5 MG tablet TAKE 1 TABLET BY MOUTH 3  TIMES A DAY AS NEEDED FOR ANXIETY/SLEEP Patient taking differently: Take 0.5 mg by mouth 3 (three) times daily as needed for sleep or anxiety. 09/26/19  Yes [name], Pearletha Furl, MD  amphetamine-dextroamphetamine (ADDERALL) 20 MG tablet Take 1 tablet by mouth three times daily Patient taking differently: Take 20 mg by mouth in the morning, at noon, and at bedtime. 11/15/21  Yes [name], Pearletha Furl, MD  cyanocobalamin (,VITAMIN B-12,) 1000 MCG/ML injection INJECT 1 ML TWICE PER MONTH Patient taking differently: Inject 1,000 mcg into the skin every 14 (fourteen) days. 12/10/21  Yes [name], Pearletha Furl, MD  HYDROcodone-acetaminophen (NORCO) 7.5-325 MG tablet TAKE 2 TABLETS BY MOUTH EVERY 8 HOURS AS  NEEDED Patient taking differently: Take 1 tablet by mouth See admin instructions. Take 1 tablet by mouth in the morning, at noontime, and later in the afternoon- may take an additional 1 tablet up to two times a day as needed for pain 12/23/21  Yes [name], Pearletha Furl, MD  hyoscyamine (LEVSIN SL) 0.125 MG SL tablet Place 1 tablet (0.125 mg total) under the tongue every 4 (four) hours as needed. Patient taking differently: Place 0.125 mg under the tongue every 4 (four) hours as needed for cramping. 11/16/20  Yes [name], Pearletha Furl, MD  TECFIDERA 240 MG CPDR TAKE 1 CAPSULE (240MG ) BY  MOUTH TWICE DAILY Patient taking differently: Take 240 mg by mouth every 12 (twelve) hours. 11/02/21  Yes [name], 11/04/21, MD  metoprolol tartrate (LOPRESSOR) 50 MG tablet TAKE 1/2 TABLET BY MOUTH EVERY 12 HOURS AS NEEDED FOR PALPITATIONS Patient taking differently: Take 25 mg by mouth every 12 (twelve) hours as needed ("for palpitations"). 10/18/21   10/20/21, MD  Syringe/Needle, Disp, (SYRINGE 3CC/23GX1") 23G X 1" 3 ML MISC 3 Syringes by Does not apply route every 30 (thirty) days. Patient taking differently: 3 Syringes by Does not apply route every 30 (thirty) days. 26Gx5/6" 10/30/17   11/01/17, MD    Physical Exam: Vitals:   01/11/22 2006 01/11/22 2100 01/11/22 2203 01/11/22 2313  BP:  117/72  122/73  Pulse:  86  77  Resp:  14  18  Temp:   99.1 F (37.3 C) 98.6 F (37 C)  TempSrc:   Oral Oral  SpO2: 93% 94%  94%  Weight:      Height:       Constitutional: NAD, calm, comfortable Eyes: PERRL, lids and conjunctivae normal ENMT: Mucous membranes are moist. Posterior pharynx clear of any exudate or lesions.Normal dentition.  Neck: normal, supple, no masses, no thyromegaly Respiratory: clear to auscultation bilaterally, no wheezing, no crackles. Normal respiratory effort. No accessory muscle use.  Cardiovascular: Regular rate and rhythm, no murmurs / rubs / gallops. No extremity edema. 2+ pedal  pulses. No carotid bruits.  Abdomen: no tenderness, no masses palpated. No hepatosplenomegaly. Bowel sounds positive.  Musculoskeletal: no clubbing / cyanosis. No joint deformity upper and lower extremities. Good ROM, no contractures. Normal muscle tone.  Skin: no rashes, lesions, ulcers. No induration Neurologic: CN 2-12 grossly intact. Sensation intact, DTR normal. Strength 5/5 in all 4.  Psychiatric: Normal judgment and insight. Alert and oriented x 3. Normal mood.    Data Reviewed:  CXR shows PNA  Assessment and Plan: * CAP (community acquired pneumonia)- (present on admission) PNA pathway Cont rocephin + azithro Repeat labs in AM PSI score 52 No O2 requirement Really only worrying feature about PNA in this patient is her immunosuppressed status.  Multiple sclerosis (HCC)- (present on admission)  Hold Tecfidera in setting of infection  Chronic prescription opiate use Cont PRN norco       Advance Care Planning:   Code Status: Full Code  Consults: None  Family Communication: No family in room  Severity of Illness: The appropriate patient status for this patient is OBSERVATION. Observation status is judged to be reasonable and necessary in order to provide the required intensity of service to ensure the patient's safety. The patient's presenting symptoms, physical exam findings, and initial radiographic and laboratory data in the context of their medical condition is felt to place them at decreased risk for further clinical deterioration. Furthermore, it is anticipated that the patient will be medically stable for discharge from the hospital within 2 midnights of admission.   Author: Hillary Bow., DO 01/12/2022 12:58 AM  For on call review [email or web address].

## 2022-01-12 NOTE — Evaluation (Signed)
Occupational Therapy Evaluation Patient Details Name: [name] MRN: [id number] DOB: [birth year] Today's Date: 01/12/2022   History of Present Illness [name] is a 61 y.o. female with medical history significant of MS, on immunosuppressive treatment.  Pt presents to ED at [facility] with c/o cough, pain with inspiration.   Clinical Impression   [name] is a 61 year old woman with MS admitted to hospital with pneumonia. She is typically modified independent in her two story home and uses a rollator in the community. ON evaluation she demonstrated ability to perform in room ambulation and ADLs. She has baseline right sided weakness - though with MMT her UE test similar. She reports some increased work of breathing but does not appear dyspneic at all. O2 sat at rest and after activity 96%. Patient does report feeling weak and somewhat unsteady but is still able to perform functional tasks. From an OT stand point patient has no OT needs.     Recommendations for follow up therapy are one component of a multi-disciplinary discharge planning process, led by the attending physician.  Recommendations may be updated based on patient status, additional functional criteria and insurance authorization.   Follow Up Recommendations  No OT follow up    Assistance Recommended at Discharge None  Patient can return home with the following      Functional Status Assessment  Patient has not had a recent decline in their functional status  Equipment Recommendations  None recommended by OT    Recommendations for Other Services       Precautions / Restrictions Precautions Precautions: None Restrictions Weight Bearing Restrictions: No         Balance Overall balance assessment: Mild deficits observed, not formally tested                                         ADL either performed or assessed with clinical judgement   ADL Overall ADL's : At baseline                                              Vision Patient Visual Report: No change from baseline       Perception     Praxis      Pertinent Vitals/Pain Pain Assessment Pain Assessment: No/denies pain     Hand Dominance Right   Extremity/Trunk Assessment Upper Extremity Assessment Upper Extremity Assessment: RUE deficits/detail;LUE deficits/detail RUE Deficits / Details: WFL ROM, 5/5 strength - reports right side is her weaker side RUE Sensation: WNL RUE Coordination: WNL LUE Deficits / Details: WFL ROM, 5/5 strength LUE Sensation: WNL LUE Coordination: WNL   Lower Extremity Assessment Lower Extremity Assessment: Defer to PT evaluation   Cervical / Trunk Assessment Cervical / Trunk Assessment: Normal   Communication Communication Communication: No difficulties   Cognition Arousal/Alertness: Awake/alert Behavior During Therapy: WFL for tasks assessed/performed Overall Cognitive Status: Within Functional Limits for tasks assessed                                                  Home Living Family/patient expects to be discharged to:: Private residence Living  Arrangements: Spouse/significant other Available Help at Discharge: Family;Available PRN/intermittently Type of Home: House Home Access: Stairs to enter Entrance Stairs-Number of Steps: 5   Home Layout: Two level Alternate Level Stairs-Number of Steps: 15   Bathroom Shower/Tub: Chief Strategy Officer: Standard     Home Equipment: Rollator (4 wheels);Cane - single point;Shower seat          Prior Functioning/Environment Prior Level of Function : Independent/Modified Independent             Mobility Comments: uses rolator for community distances ADLs Comments: Mod I        OT Problem List:        OT Treatment/Interventions:      OT Goals(Current goals can be found in the care plan section) Acute Rehab OT Goals OT Goal Formulation: All assessment  and education complete, DC therapy  OT Frequency:      Co-evaluation              AM-PAC OT "6 Clicks" Daily Activity     Outcome Measure Help from another person eating meals?: None Help from another person taking care of personal grooming?: None Help from another person toileting, which includes using toliet, bedpan, or urinal?: None Help from another person bathing (including washing, rinsing, drying)?: None Help from another person to put on and taking off regular upper body clothing?: None Help from another person to put on and taking off regular lower body clothing?: None 6 Click Score: 24   End of Session Nurse Communication: Mobility status  Activity Tolerance: Patient tolerated treatment well Patient left: in bed;with call bell/phone within reach  OT Visit Diagnosis: Muscle weakness (generalized) (M62.81)                Time: 5284-1324 OT Time Calculation (min): 11 min Charges:  OT General Charges $OT Visit: 1 Visit OT Evaluation $OT Eval Low Complexity: 1 Low  Jalil Lorusso, OTR/L Acute Care Rehab Services  Office [phone] Pager: [phone]   Kelli Churn 01/12/2022, 2:50 PM

## 2022-01-12 NOTE — Hospital Course (Addendum)
[name] is [name] 61 y.o. female with medical history significant of MS, on immunosuppressive treatment.  Pt presents to ED at [facility] with c/o cough, pain with inspiration.  CT scan was consistent with multifocal pneumonia.  She was treated with ceftriaxone and azithromycin and discharged on amoxicillin and azithromycin.  She'll need repeat chest imaging as an outpatient.  Instructed to hold MS meds until completion of abx.    See below for additional details

## 2022-01-12 NOTE — Assessment & Plan Note (Addendum)
CXR with evidence of pneumonia, r mid lung density with possible cavitation CT chest with no cavitation, but evidence of multifocal pneumonia - will need follow up after resolution of acute process to exclude underlying mass lesion Cont rocephin + azithro Urine strep - negative, legionella pending Sputum cx not accepted mrsa PCR negative No O2 requirement

## 2022-01-12 NOTE — Assessment & Plan Note (Signed)
Cont PRN norco

## 2022-01-12 NOTE — Progress Notes (Signed)
PROGRESS NOTE    [name]  QIH:[id number] DOB: 18-Dec-1960 DOA: 01/11/2022 PCP: [name], MD  Chief Complaint  Patient presents with   Fever    Brief Narrative:  [name] is [name] 61 y.o. female with medical history significant of MS, on immunosuppressive treatment.  Pt presents to ED at [facility] with c/o cough, pain with inspiration.   Symptoms ongoing for the past ~8 days.  Associated fever at home.    Assessment & Plan:   Principal Problem:   CAP (community acquired pneumonia) Active Problems:   Multiple sclerosis (HCC)   Chronic prescription opiate use   Assessment and Plan: * CAP (community acquired pneumonia)- (present on admission) CXR with evidence of pneumonia, r mid lung density with possible cavitation Follow CT chest with contrast Cont rocephin + azithro Urine strep, legionella Sputum cx mrsa PCR No O2 requirement  Multiple sclerosis (HCC)- (present on admission) Hold Tecfidera in setting of infection She notes infxn often triggers MS and she's noticed typical symptoms for her - she'll let us know if worsening or something out of ordinary   Chronic prescription opiate use Cont PRN norco   DVT prophylaxis: lovenox Code Status: full Family Communication: husband at bedside Disposition:   Status is: Observation The patient will require care spanning > 2 midnights and should be moved to inpatient because: need for IV abx, additional imaging   Consultants:  none  Procedures:  none  Antimicrobials:  Anti-infectives (From admission, onward)    Start     Dose/Rate Route Frequency Ordered Stop   01/12/22 1700  cefTRIAXone (ROCEPHIN) 2 g in sodium chloride 0.9 % 100 mL IVPB        2 g 200 mL/hr over 30 Minutes Intravenous Every 24 hours 01/11/22 2336 01/17/22 1659   01/12/22 1700  azithromycin (ZITHROMAX) 500 mg in sodium chloride 0.9 % 250 mL IVPB        500 mg 250 mL/hr over 60 Minutes Intravenous Every 24 hours 01/11/22 2336 01/17/22  1659   01/11/22 1715  cefTRIAXone (ROCEPHIN) 1 g in sodium chloride 0.9 % 100 mL IVPB        1 g 200 mL/hr over 30 Minutes Intravenous  Once 01/11/22 1709 01/11/22 1931   01/11/22 1715  azithromycin (ZITHROMAX) 500 mg in sodium chloride 0.9 % 250 mL IVPB        500 mg 250 mL/hr over 60 Minutes Intravenous  Once 01/11/22 1709 01/11/22 1931      Subjective: She denies TB hx or exposure, previously owned dance studio She notes infxn triggers MS, weakness, lethargy   Objective: Vitals:   01/12/22 0135 01/12/22 0526 01/12/22 0920 01/12/22 1250  BP: 122/71 130/66 138/73 124/78  Pulse: 72 73 80 80  Resp: 18 16 16 16   Temp: 98.9 F (37.2 C) 98.7 F (37.1 C) 98.3 F (36.8 C) 98 F (36.7 C)  TempSrc: Oral Oral  Oral  SpO2: 94% 99% 96% 95%  Weight:      Height:        Intake/Output Summary (Last 24 hours) at 01/12/2022 1602 Last data filed at 01/12/2022 1401 Gross per 24 hour  Intake 1620 ml  Output --  Net 1620 ml   Filed Weights   01/11/22 1555  Weight: 61 kg    Examination:  General exam: Appears calm and comfortable  Respiratory system: unlabored Cardiovascular system: RRR Gastrointestinal system: Abdomen is nondistended, soft and nontender. Central nervous system: Alert and oriented. No focal neurological  deficits. Extremities: no LEE Skin: No rashes, lesions or ulcers Psychiatry: Judgement and insight appear normal. Mood & affect appropriate.     Data Reviewed: I have personally reviewed following labs and imaging studies  CBC: Recent Labs  Lab 01/11/22 1616 01/12/22 0420  WBC 9.0 7.7  NEUTROABS 6.4  --   HGB 13.9 11.4*  HCT 42.6 34.5*  MCV 91.8 92.7  PLT 253 230    Basic Metabolic Panel: Recent Labs  Lab 01/11/22 1616 01/12/22 0420  NA 138 136  K 3.9 3.3*  CL 98 101  CO2 31 27  GLUCOSE 101* 115*  BUN 13 16  CREATININE 0.68 0.63  CALCIUM 9.8 8.3*    GFR: Estimated Creatinine Clearance: 65.6 mL/min (by C-G formula based on SCr of 0.63  mg/dL).  Liver Function Tests: Recent Labs  Lab 01/11/22 1616 01/12/22 0416  AST 28  --   ALT 25  --   ALKPHOS 87  --   BILITOT 0.8  --   PROT 8.4*  --   ALBUMIN 4.5 3.1*    CBG: No results for input(s): GLUCAP in the last 168 hours.   Recent Results (from the past 240 hour(s))  Culture, blood (Routine x 2)     Status: None (Preliminary result)   Collection Time: 01/11/22  4:16 PM   Specimen: BLOOD  Result Value Ref Range Status   Specimen Description   Final    BLOOD RIGHT ANTECUBITAL Performed at [facility], [address] [place], [place] [address]    Special Requests   Final    BOTTLES DRAWN AEROBIC AND ANAEROBIC Blood Culture adequate volume Performed at [facility], [address], [place], [place] [address]    Culture   Final    NO GROWTH < 12 HOURS Performed at [facility], [address], [place], [place] [address]    Report Status PENDING  Incomplete  Resp Panel by RT-PCR (Flu Xayvier Vallez&B, Covid) Nasopharyngeal Swab     Status: None   Collection Time: 01/11/22  4:52 PM   Specimen: Nasopharyngeal Swab; Nasopharyngeal(NP) swabs in vial transport medium  Result Value Ref Range Status   SARS Coronavirus 2 by RT PCR NEGATIVE NEGATIVE Final    Comment: (NOTE) SARS-CoV-2 target nucleic acids are NOT DETECTED.  The SARS-CoV-2 RNA is generally detectable in upper respiratory specimens during the acute phase of infection. The lowest concentration of SARS-CoV-2 viral copies this assay can detect is 138 copies/mL. Avina Eberle negative result does not preclude SARS-Cov-2 infection and should not be used as the sole basis for treatment or other patient management decisions. Tameca Jerez negative result may occur with  improper specimen collection/handling, submission of specimen other than nasopharyngeal swab, presence of viral mutation(s) within the areas targeted by this assay, and inadequate number of viral copies(<138 copies/mL). Mikala Podoll negative  result must be combined with clinical observations, patient history, and epidemiological information. The expected result is Negative.  Fact Sheet for Patients:  BloggerCourse.com  Fact Sheet for Healthcare Providers:  SeriousBroker.it  This test is no t yet approved or cleared by the [place] FDA and  has been authorized for detection and/or diagnosis of SARS-CoV-2 by FDA under an Emergency Use Authorization (EUA). This EUA will remain  in effect (meaning this test can be used) for the duration of the COVID-19 declaration under Section 564(b)(1) of the Act, 21 U.S.C.section 360bbb-3(b)(1), unless the authorization is terminated  or revoked sooner.       Influenza Franco Duley by  PCR NEGATIVE NEGATIVE Final   Influenza B by PCR NEGATIVE NEGATIVE Final    Comment: (NOTE) The Xpert Xpress SARS-CoV-2/FLU/RSV plus assay is intended as an aid in the diagnosis of influenza from Nasopharyngeal swab specimens and should not be used as Keary Waterson sole basis for treatment. Nasal washings and aspirates are unacceptable for Xpert Xpress SARS-CoV-2/FLU/RSV testing.  Fact Sheet for Patients: BloggerCourse.com  Fact Sheet for Healthcare Providers: SeriousBroker.it  This test is not yet approved or cleared by the [place] FDA and has been authorized for detection and/or diagnosis of SARS-CoV-2 by FDA under an Emergency Use Authorization (EUA). This EUA will remain in effect (meaning this test can be used) for the duration of the COVID-19 declaration under Section 564(b)(1) of the Act, 21 U.S.C. section 360bbb-3(b)(1), unless the authorization is terminated or revoked.  Performed at [facility], [address], [place], [place] [address]   Culture, blood (Routine x 2)     Status: None (Preliminary result)   Collection Time: 01/11/22  5:28 PM   Specimen: BLOOD  Result Value Ref  Range Status   Specimen Description   Final    BLOOD LEFT ANTECUBITAL Performed at [facility], [address] [place], [place] [address]    Special Requests   Final    Blood Culture adequate volume BOTTLES DRAWN AEROBIC AND ANAEROBIC Performed at [facility], [address], [place], [place] [address]    Culture   Final    NO GROWTH < 12 HOURS Performed at [facility], [address] [place], [place] [address]    Report Status PENDING  Incomplete         Radiology Studies: DG Chest 2 View  Result Date: 01/11/2022 CLINICAL DATA:  Suspected sepsis EXAM: CHEST - 2 VIEW COMPARISON:  05/23/2017 FINDINGS: Subtle infiltrate in the right mid lung possibly with cavitation. This was not present previously. Left lung is clear. No effusion Cardiac and mediastinal contours normal.  Normal vascularity. IMPRESSION: Ill-defined airspace density right mid lung with possible cavitation. Given history this is likely pneumonia. Followup PA and lateral chest X-ray is recommended in 3-4 weeks following trial of antibiotic therapy to ensure resolution and exclude underlying malignancy. Electronically Signed   By: Marlan Palau M.D.   On: 01/11/2022 16:32        Scheduled Meds:  amphetamine-dextroamphetamine  20 mg Oral TID AC   enoxaparin (LOVENOX) injection  40 mg Subcutaneous Q24H   Continuous Infusions:  azithromycin     cefTRIAXone (ROCEPHIN)  IV       LOS: 1 day    Time spent: over 30 min    Lacretia Nicks, MD [facility]   To contact the attending provider between 7A-7P or the covering provider during after hours 7P-7A, please log into the web site [email or web address] and access using universal [place] password for that web site. If you do not have the password, please call the hospital operator.  01/12/2022, 4:02 PM

## 2022-01-12 NOTE — Evaluation (Signed)
Physical Therapy Evaluation Patient Details Name: [name] MRN: [id number] DOB: 1960/12/17 Today's Date: 01/12/2022  History of Present Illness  [name] is a 61 y.o. female admitted on 01/11/22 with PNE. Pt with medical history significant of MS, on immunosuppressive treatment.  Clinical Impression  Pt admitted with above diagnosis.  At baseline she is independent with ADLs, IADLs, and can ambulate in community.  She does have chronic R LE weakness due to MS and meniscus injury.  Pt presenting with baseline mobility.  She ambulated 300' without difficulty.  Pt on RA with O2 sats stable. No further acute PT needs.        Recommendations for follow up therapy are one component of a multi-disciplinary discharge planning process, led by the attending physician.  Recommendations may be updated based on patient status, additional functional criteria and insurance authorization.  Follow Up Recommendations No PT follow up    Assistance Recommended at Discharge None  Patient can return home with the following       Equipment Recommendations None recommended by PT  Recommendations for Other Services       Functional Status Assessment Patient has not had a recent decline in their functional status     Precautions / Restrictions Precautions Precautions: None Restrictions Weight Bearing Restrictions: No      Mobility  Bed Mobility Overal bed mobility: Needs Assistance Bed Mobility: Supine to Sit     Supine to sit: Independent          Transfers Overall transfer level: Needs assistance   Transfers: Sit to/from Stand Sit to Stand: Independent                Ambulation/Gait Ambulation/Gait assistance: Supervision Gait Distance (Feet): 300 Feet Assistive device: None Gait Pattern/deviations: Step-through pattern Gait velocity: normal     General Gait Details: Slight decrease in stance time on R with some circumduction but steady and feels at  baseline  Stairs            Wheelchair Mobility    Modified Rankin (Stroke Patients Only)       Balance Overall balance assessment: Needs assistance Sitting-balance support: No upper extremity supported Sitting balance-Leahy Scale: Normal     Standing balance support: No upper extremity supported Standing balance-Leahy Scale: Good                               Pertinent Vitals/Pain Pain Assessment Pain Assessment: No/denies pain    Home Living Family/patient expects to be discharged to:: Private residence Living Arrangements: Spouse/significant other Available Help at Discharge: Family;Available PRN/intermittently Type of Home: House Home Access: Stairs to enter   Entrance Stairs-Number of Steps: 5 Alternate Level Stairs-Number of Steps: 15 Home Layout: Two level Home Equipment: Rollator (4 wheels);Cane - single point;Shower seat      Prior Function Prior Level of Function : Independent/Modified Independent             Mobility Comments: No AD at home, occasionally uses rollator for community distances; reports R leg drags at times if she gets hot or surface is uneven ADLs Comments: Mod I     Hand Dominance   Dominant Hand: Right    Extremity/Trunk Assessment   Upper Extremity Assessment Upper Extremity Assessment: Defer to OT evaluation RUE Deficits / Details: WFL ROM, 5/5 strength - reports right side is her weaker side RUE Sensation: WNL RUE Coordination: WNL LUE Deficits / Details:  WFL ROM, 5/5 strength LUE Sensation: WNL LUE Coordination: WNL    Lower Extremity Assessment Lower Extremity Assessment: LLE deficits/detail;RLE deficits/detail RLE Deficits / Details: ROM WFL; MMT: 4+/5 hip, 4-/5 knee (reports weak from MS and has meniscus injury), ankle 4+/5 LLE Deficits / Details: ROM: WFL; MMT 5/5    Cervical / Trunk Assessment Cervical / Trunk Assessment: Normal  Communication   Communication: No difficulties  Cognition  Arousal/Alertness: Awake/alert Behavior During Therapy: WFL for tasks assessed/performed Overall Cognitive Status: Within Functional Limits for tasks assessed                                          General Comments General comments (skin integrity, edema, etc.): Pt on RA with sats 95% or >.  Did discuss possibe outpt PT for R LE strengthening - pt aware of how to follow up    Exercises     Assessment/Plan    PT Assessment Patient does not need any further PT services  PT Problem List         PT Treatment Interventions      PT Goals (Current goals can be found in the Care Plan section)  Acute Rehab PT Goals Patient Stated Goal: return home PT Goal Formulation: All assessment and education complete, DC therapy    Frequency       Co-evaluation               AM-PAC PT "6 Clicks" Mobility  Outcome Measure Help needed turning from your back to your side while in a flat bed without using bedrails?: None Help needed moving from lying on your back to sitting on the side of a flat bed without using bedrails?: None Help needed moving to and from a bed to a chair (including a wheelchair)?: None Help needed standing up from a chair using your arms (e.g., wheelchair or bedside chair)?: None Help needed to walk in hospital room?: None Help needed climbing 3-5 steps with a railing? : A Little 6 Click Score: 23    End of Session   Activity Tolerance: Patient tolerated treatment well Patient left: in bed;with call bell/phone within reach   PT Visit Diagnosis: Other abnormalities of gait and mobility (R26.89)    Time: [id number] PT Time Calculation (min) (ACUTE ONLY): 12 min   Charges:   PT Evaluation $PT Eval Low Complexity: 1 Low          Devin Ganaway, PT Acute Rehab Services Pager [phone] [facility] [phone]   Karlton Lemon 01/12/2022, 4:29 PM

## 2022-01-12 NOTE — Assessment & Plan Note (Addendum)
Hold Tecfidera in setting of infection She notes infxn often triggers MS and she's noticed typical symptoms for her - she'll let us know if worsening or something out of ordinary

## 2022-01-13 ENCOUNTER — Telehealth: Payer: Self-pay

## 2022-01-13 DIAGNOSIS — J189 Pneumonia, unspecified organism: Secondary | ICD-10-CM | POA: Diagnosis not present

## 2022-01-13 LAB — EXPECTORATED SPUTUM ASSESSMENT W GRAM STAIN, RFLX TO RESP C

## 2022-01-13 LAB — CBC WITH DIFFERENTIAL/PLATELET
Abs Immature Granulocytes: 0.01 10*3/uL (ref 0.00–0.07)
Basophils Absolute: 0.1 10*3/uL (ref 0.0–0.1)
Basophils Relative: 1 %
Eosinophils Absolute: 0.1 10*3/uL (ref 0.0–0.5)
Eosinophils Relative: 2 %
HCT: 35.3 % — ABNORMAL LOW (ref 36.0–46.0)
Hemoglobin: 11.8 g/dL — ABNORMAL LOW (ref 12.0–15.0)
Immature Granulocytes: 0 %
Lymphocytes Relative: 29 %
Lymphs Abs: 1.6 10*3/uL (ref 0.7–4.0)
MCH: 30.9 pg (ref 26.0–34.0)
MCHC: 33.4 g/dL (ref 30.0–36.0)
MCV: 92.4 fL (ref 80.0–100.0)
Monocytes Absolute: 0.7 10*3/uL (ref 0.1–1.0)
Monocytes Relative: 12 %
Neutro Abs: 3.1 10*3/uL (ref 1.7–7.7)
Neutrophils Relative %: 56 %
Platelets: 265 10*3/uL (ref 150–400)
RBC: 3.82 MIL/uL — ABNORMAL LOW (ref 3.87–5.11)
RDW: 12.8 % (ref 11.5–15.5)
WBC: 5.5 10*3/uL (ref 4.0–10.5)
nRBC: 0 % (ref 0.0–0.2)

## 2022-01-13 LAB — COMPREHENSIVE METABOLIC PANEL
ALT: 36 U/L (ref 0–44)
AST: 35 U/L (ref 15–41)
Albumin: 3.1 g/dL — ABNORMAL LOW (ref 3.5–5.0)
Alkaline Phosphatase: 71 U/L (ref 38–126)
Anion gap: 7 (ref 5–15)
BUN: 15 mg/dL (ref 8–23)
CO2: 28 mmol/L (ref 22–32)
Calcium: 8.6 mg/dL — ABNORMAL LOW (ref 8.9–10.3)
Chloride: 103 mmol/L (ref 98–111)
Creatinine, Ser: 0.6 mg/dL (ref 0.44–1.00)
GFR, Estimated: >60 mL/min (ref >60)
Glucose, Bld: 125 mg/dL — ABNORMAL HIGH (ref 70–99)
Potassium: 4.1 mmol/L (ref 3.5–5.1)
Sodium: 138 mmol/L (ref 135–145)
Total Bilirubin: <0.1 mg/dL — ABNORMAL LOW (ref 0.3–1.2)
Total Protein: 6.2 g/dL — ABNORMAL LOW (ref 6.5–8.1)

## 2022-01-13 LAB — MAGNESIUM: Magnesium: 1.9 mg/dL (ref 1.7–2.4)

## 2022-01-13 LAB — PHOSPHORUS: Phosphorus: 3.4 mg/dL (ref 2.5–4.6)

## 2022-01-13 MED ORDER — AMOXICILLIN 500 MG PO CAPS: 1000.0000 mg | ORAL_CAPSULE | Freq: Three times a day (TID) | ORAL | 0 refills | Status: AC

## 2022-01-13 MED ORDER — AZITHROMYCIN 500 MG PO TABS: 500.0000 mg | ORAL_TABLET | Freq: Every day | ORAL | 0 refills | Status: AC

## 2022-01-13 NOTE — Discharge Summary (Signed)
Physician Discharge Summary  [name] SKA:[id number] DOB: 27-Jan-1961 DOA: 01/11/2022  PCP: Lynn Mackintosh, MD  Admit date: 01/11/2022 Discharge date: 01/13/2022  Time spent: 40 minutes  Recommendations for Outpatient Follow-up:  Follow outpatient CBC/CMP Needs repeat chest imaging after resolution of symptoms Hold MS med until symptoms improve/completion of abx  Discharge Diagnoses:  Principal Problem:   CAP (community acquired pneumonia) Active Problems:   Multiple sclerosis (HCC)   Chronic prescription opiate use   Discharge Condition: stable  Diet recommendation: heart healthy  Filed Weights   01/11/22 1555  Weight: 61 kg    History of present illness:  [name] is [name] 61 y.o. female with medical history significant of MS, on immunosuppressive treatment.  Pt presents to ED at [facility] with c/o cough, pain with inspiration.  CT scan was consistent with multifocal pneumonia.  She was treated with ceftriaxone and azithromycin and discharged on amoxicillin and azithromycin.  She'll need repeat chest imaging as an outpatient.  Instructed to hold MS meds until completion of abx.    See below for additional details  Hospital Course:  Assessment and Plan: * CAP (community acquired pneumonia)- (present on admission) CXR with evidence of pneumonia, r mid lung density with possible cavitation CT chest with no cavitation, but evidence of multifocal pneumonia - will need follow up after resolution of acute process to exclude underlying mass lesion Cont rocephin + azithro Urine strep - negative, legionella pending Sputum cx not accepted mrsa PCR negative No O2 requirement  Multiple sclerosis (HCC)- (present on admission) Hold Tecfidera in setting of infection She notes infxn often triggers MS and she's noticed typical symptoms for her - she'll let us know if worsening or something out of ordinary   Chronic prescription opiate use Cont PRN norco   Procedures: none    Consultations: none  Discharge Exam: Vitals:   01/12/22 2204 01/13/22 0534  BP: 131/77 122/75  Pulse: 91 80  Resp: 14 16  Temp: 98.3 F (36.8 C) 98.6 F (37 C)  SpO2: 95% 97%   Feels better Eager for discharge Husband at bedside  General: No acute distress. Cardiovascular: Heart sounds show [name] regular rate, and rhythm. Lungs: Clear to auscultation bilaterally with good air movement.  Neurological: Alert and oriented 3. Moves all extremities 4 . Cranial nerves II through XII grossly intact. Skin: Warm and dry. No rashes or lesions. Extremities: No clubbing or cyanosis. No edema.  Discharge Instructions   Discharge Instructions     Call MD for:  difficulty breathing, headache or visual disturbances   Complete by: As directed    Call MD for:  extreme fatigue   Complete by: As directed    Call MD for:  hives   Complete by: As directed    Call MD for:  persistant dizziness or light-headedness   Complete by: As directed    Call MD for:  persistant nausea and vomiting   Complete by: As directed    Call MD for:  redness, tenderness, or signs of infection (pain, swelling, redness, odor or green/yellow discharge around incision site)   Complete by: As directed    Call MD for:  severe uncontrolled pain   Complete by: As directed    Call MD for:  temperature >100.4   Complete by: As directed    Diet - low sodium heart healthy   Complete by: As directed    Discharge instructions   Complete by: As directed    You were seen  for pneumonia.  We'll discharge you on azithromycin and amoxicillin to complete [name] course.  Hold your MS medication until you follow up with your PCP outpatient.  You'll need repeat chest imaging to follow up your abnormal CT scan as an outpatient.  Return for new, recurrent, or worsening symptoms.  Please ask your PCP to request records from this hospitalization so they know what was done and what the next steps will be.   Increase activity slowly    Complete by: As directed       Allergies as of 01/13/2022       Reactions   Ibuprofen Other (See Comments)   CAUSES LYMPHEDEMA   Nsaids Other (See Comments)   AVOIDS ANY SODIUM-CONTAINING MED. -- CAUSES LYMPHEDEMA   Other Swelling, Other (See Comments)   Laxatives and artificial sweeteners- cause leg swelling   Vitamin D Analogs Palpitations, Other (See Comments)   Cannot tolerate at 50,000 units OR even 2,000 units        Medication List     TAKE these medications    ALPRAZolam 0.5 MG tablet Commonly known as: XANAX TAKE 1 TABLET BY MOUTH 3 TIMES [name] DAY AS NEEDED FOR ANXIETY/SLEEP What changed:  how much to take how to take this when to take this reasons to take this additional instructions   amoxicillin 500 MG capsule Commonly known as: AMOXIL Take 2 capsules (1,000 mg total) by mouth 3 (three) times daily for 5 days.   amphetamine-dextroamphetamine 20 MG tablet Commonly known as: Adderall Take 1 tablet by mouth three times daily What changed:  how much to take how to take this when to take this additional instructions   azithromycin 500 MG tablet Commonly known as: Zithromax Take 1 tablet (500 mg total) by mouth daily for 3 days. Take 1 tablet daily for 3 days.   cyanocobalamin 1000 MCG/ML injection Commonly known as: (VITAMIN B-12) INJECT 1 ML TWICE PER MONTH What changed: See the new instructions.   HYDROcodone-acetaminophen 7.5-325 MG tablet Commonly known as: NORCO TAKE 2 TABLETS BY MOUTH EVERY 8 HOURS AS NEEDED What changed:  how much to take how to take this when to take this additional instructions   hyoscyamine 0.125 MG SL tablet Commonly known as: LEVSIN SL Place 1 tablet (0.125 mg total) under the tongue every 4 (four) hours as needed. What changed: reasons to take this   metoprolol tartrate 50 MG tablet Commonly known as: LOPRESSOR TAKE 1/2 TABLET BY MOUTH EVERY 12 HOURS AS NEEDED FOR PALPITATIONS What changed: See the new  instructions.   SYRINGE 3CC/23GX1" 23G X 1" 3 ML Misc 3 Syringes by Does not apply route every 30 (thirty) days. What changed: additional instructions   Tecfidera 240 MG Cpdr Generic drug: Dimethyl Fumarate TAKE 1 CAPSULE (240MG ) BY  MOUTH TWICE DAILY What changed:  how much to take how to take this when to take this additional instructions       Allergies  Allergen Reactions   Ibuprofen Other (See Comments)    CAUSES LYMPHEDEMA   Nsaids Other (See Comments)    AVOIDS ANY SODIUM-CONTAINING MED. -- CAUSES LYMPHEDEMA   Other Swelling and Other (See Comments)    Laxatives and artificial sweeteners- cause leg swelling   Vitamin D Analogs Palpitations and Other (See Comments)    Cannot tolerate at 50,000 units OR even 2,000 units      The results of significant diagnostics from this hospitalization (including imaging, microbiology, ancillary and laboratory) are listed below for reference.  Significant Diagnostic Studies: DG Chest 2 View  Result Date: 01/11/2022 CLINICAL DATA:  Suspected sepsis EXAM: CHEST - 2 VIEW COMPARISON:  05/23/2017 FINDINGS: Subtle infiltrate in the right mid lung possibly with cavitation. This was not present previously. Left lung is clear. No effusion Cardiac and mediastinal contours normal.  Normal vascularity. IMPRESSION: Ill-defined airspace density right mid lung with possible cavitation. Given history this is likely pneumonia. Followup PA and lateral chest X-ray is recommended in 3-4 weeks following trial of antibiotic therapy to ensure resolution and exclude underlying malignancy. Electronically Signed   By: Franchot Gallo M.D.   On: 01/11/2022 16:32   CT CHEST W CONTRAST  Result Date: 01/12/2022 CLINICAL DATA:  Respiratory illness with immunodeficiency. Cough and fever for Jadien Lehigh week. Infiltrates demonstrated on chest radiograph. EXAM: CT CHEST WITH CONTRAST TECHNIQUE: Multidetector CT imaging of the chest was performed during intravenous contrast  administration. RADIATION DOSE REDUCTION: This exam was performed according to the departmental dose-optimization program which includes automated exposure control, adjustment of the mA and/or kV according to patient size and/or use of iterative reconstruction technique. CONTRAST:  64mL OMNIPAQUE IOHEXOL 300 MG/ML  SOLN COMPARISON:  Chest radiograph 01/11/2022.  CT 10/06/2008 FINDINGS: Cardiovascular: Normal heart size. No pericardial effusions. Normal caliber thoracic aorta. Aortic and coronary artery calcifications. Mediastinum/Nodes: Esophagus is decompressed. Thyroid gland is unremarkable. No significant lymphadenopathy. Lungs/Pleura: Several focal nodular ground-glass infiltrates are demonstrated scattered throughout the right lung, mostly in the superior segment. Largest of these measures 1.8 x 3.1 cm in diameter and accounts for the chest radiographic abnormality. No cavitation is demonstrated. These changes are most likely to represent multifocal pneumonia. Follow-up after resolution is recommended to exclude underlying mass lesion. The left lung is clear. No pleural effusions. No pneumothorax. Airways are patent. Upper Abdomen: No acute abnormality. Musculoskeletal: No chest wall abnormality. No acute or significant osseous findings. IMPRESSION: 1. Patchy airspace infiltrates in the right lower lung likely representing multifocal pneumonia. Follow-up after resolution of acute process recommended to exclude underlying mass lesion. 2. Left lung is clear. 3. Mild aortic atherosclerosis. Electronically Signed   By: Lucienne Capers M.D.   On: 01/12/2022 18:25    Microbiology: Recent Results (from the past 240 hour(s))  Culture, blood (Routine x 2)     Status: None (Preliminary result)   Collection Time: 01/11/22  4:16 PM   Specimen: BLOOD  Result Value Ref Range Status   Specimen Description   Final    BLOOD RIGHT ANTECUBITAL Performed at [facility], [address],  [place], [place] [address]    Special Requests   Final    BOTTLES DRAWN AEROBIC AND ANAEROBIC Blood Culture adequate volume Performed at [facility], [address] [place], [place] [address]    Culture   Final    NO GROWTH < 12 HOURS Performed at [facility] [address] [place], [place] [address]    Report Status PENDING  Incomplete  Resp Panel by RT-PCR (Flu Seibert Keeter&B, Covid) Nasopharyngeal Swab     Status: None   Collection Time: 01/11/22  4:52 PM   Specimen: Nasopharyngeal Swab; Nasopharyngeal(NP) swabs in vial transport medium  Result Value Ref Range Status   SARS Coronavirus 2 by RT PCR NEGATIVE NEGATIVE Final    Comment: (NOTE) SARS-CoV-2 target nucleic acids are NOT DETECTED.  The SARS-CoV-2 RNA is generally detectable in upper respiratory specimens during the acute phase of infection. The lowest concentration of SARS-CoV-2 viral copies this assay can detect is 138  copies/mL. Charlyne Robertshaw negative result does not preclude SARS-Cov-2 infection and should not be used as the sole basis for treatment or other patient management decisions. Kawika Bischoff negative result may occur with  improper specimen collection/handling, submission of specimen other than nasopharyngeal swab, presence of viral mutation(s) within the areas targeted by this assay, and inadequate number of viral copies(<138 copies/mL). Eve Rey negative result must be combined with clinical observations, patient history, and epidemiological information. The expected result is Negative.  Fact Sheet for Patients:  EntrepreneurPulse.com.au  Fact Sheet for Healthcare Providers:  IncredibleEmployment.be  This test is no t yet approved or cleared by the [place] FDA and  has been authorized for detection and/or diagnosis of SARS-CoV-2 by FDA under an Emergency Use Authorization (EUA). This EUA will remain  in effect (meaning this test can be used) for the duration of the COVID-19  declaration under Section 564(b)(1) of the Act, 21 U.S.C.section 360bbb-3(b)(1), unless the authorization is terminated  or revoked sooner.       Influenza Suann Klier by PCR NEGATIVE NEGATIVE Final   Influenza B by PCR NEGATIVE NEGATIVE Final    Comment: (NOTE) The Xpert Xpress SARS-CoV-2/FLU/RSV plus assay is intended as an aid in the diagnosis of influenza from Nasopharyngeal swab specimens and should not be used as Obdulio Mash sole basis for treatment. Nasal washings and aspirates are unacceptable for Xpert Xpress SARS-CoV-2/FLU/RSV testing.  Fact Sheet for Patients: EntrepreneurPulse.com.au  Fact Sheet for Healthcare Providers: IncredibleEmployment.be  This test is not yet approved or cleared by the [place] FDA and has been authorized for detection and/or diagnosis of SARS-CoV-2 by FDA under an Emergency Use Authorization (EUA). This EUA will remain in effect (meaning this test can be used) for the duration of the COVID-19 declaration under Section 564(b)(1) of the Act, 21 U.S.C. section 360bbb-3(b)(1), unless the authorization is terminated or revoked.  Performed at [facility], [address] [place], [place] [address]   Culture, blood (Routine x 2)     Status: None (Preliminary result)   Collection Time: 01/11/22  5:28 PM   Specimen: BLOOD  Result Value Ref Range Status   Specimen Description   Final    BLOOD LEFT ANTECUBITAL Performed at [facility], [address] [place], [place] [address]    Special Requests   Final    Blood Culture adequate volume BOTTLES DRAWN AEROBIC AND ANAEROBIC Performed at [facility], [address], [place], [place] [address]    Culture   Final    NO GROWTH < 12 HOURS Performed at [facility], [place] [address], [place], [place] [address]    Report Status PENDING  Incomplete  MRSA Next Gen by PCR, Nasal     Status: None   Collection Time:  01/12/22  3:57 PM   Specimen: Nasal Mucosa; Nasal Swab  Result Value Ref Range Status   MRSA by PCR Next Gen NOT DETECTED NOT DETECTED Final    Comment: (NOTE) The GeneXpert MRSA Assay (FDA approved for NASAL specimens only), is one component of Emmalynn Pinkham comprehensive MRSA colonization surveillance program. It is not intended to diagnose MRSA infection nor to guide or monitor treatment for MRSA infections. Test performance is not FDA approved in patients less than 66 years old. Performed at [facility], [place] [address] [place], [place] [address]   Expectorated Sputum Assessment w Gram Stain, Rflx to Resp Cult     Status: None   Collection Time: 01/12/22 11:30 PM   Specimen: Expectorated  Sputum  Result Value Ref Range Status   Specimen Description EXPECTORATED SPUTUM  Final   Special Requests NONE  Final   Sputum evaluation   Final    Sputum specimen not acceptable for testing.  Please recollect.   Performed at [facility], [place] [address], [place], [place] [address]    Report Status 01/13/2022 FINAL  Final     Labs: Basic Metabolic Panel: Recent Labs  Lab 01/11/22 1616 01/12/22 0420 01/13/22 0358  NA 138 136 138  K 3.9 3.3* 4.1  CL 98 101 103  CO2 31 27 28   GLUCOSE 101* 115* 125*  BUN 13 16 15   CREATININE 0.68 0.63 0.60  CALCIUM 9.8 8.3* 8.6*  MG  --   --  1.9  PHOS  --   --  3.4   Liver Function Tests: Recent Labs  Lab 01/11/22 1616 01/12/22 0416 01/13/22 0358  AST 28  --  35  ALT 25  --  36  ALKPHOS 87  --  71  BILITOT 0.8  --  <0.1*  PROT 8.4*  --  6.2*  ALBUMIN 4.5 3.1* 3.1*   No results for input(s): LIPASE, AMYLASE in the last 168 hours. No results for input(s): AMMONIA in the last 168 hours. CBC: Recent Labs  Lab 01/11/22 1616 01/12/22 0420 01/13/22 0358  WBC 9.0 7.7 5.5  NEUTROABS 6.4  --  3.1  HGB 13.9 11.4* 11.8*  HCT 42.6 34.5* 35.3*  MCV 91.8 92.7 92.4  PLT 253 230 265   Cardiac Enzymes: No results  for input(s): CKTOTAL, CKMB, CKMBINDEX, TROPONINI in the last 168 hours. BNP: BNP (last 3 results) No results for input(s): BNP in the last 8760 hours.  ProBNP (last 3 results) No results for input(s): PROBNP in the last 8760 hours.  CBG: No results for input(s): GLUCAP in the last 168 hours.     Signed:  Fayrene Helper MD.  [facility] 01/13/2022, 11:11 AM

## 2022-01-13 NOTE — Evaluation (Signed)
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   Headache(784.0)    History of concussion YOUNG ADULT--  NO RESIDUAL   IBS (irritable bowel syndrome)    Movement disorder    MS (multiple sclerosis) (HCC)    MVP (mitral valve prolapse) HX HEART RACING  YRS AGO   ASYMPTOMATIC SINCE THEN   Vision abnormalities    Weakness of right leg SECONDARY TO MS   Past Surgical History:  Past Surgical History:  Procedure Laterality Date   CESAREAN SECTION  X3   BILATERAL TUBAL LIGATION W/ LAST ONE   DESTRUCTION OF ANAL CONDYLOMA  07-07-2005  DR TOTH   HERNIA REPAIR     KNEE ARTHROSCOPY WITH LATERAL MENISECTOMY  10/26/2012   Procedure: KNEE ARTHROSCOPY WITH LATERAL MENISECTOMY;  Surgeon: Javier Docker, MD;  Location: [facility];  Service: Orthopedics;  Laterality: Left;  Left Knee Arthrsocopy with Partial Lateral Menisectomy   LAPAROSCOPIC ASSISTED VAGINAL HYSTERECTOMY  01-15-2002  DR Gerlene Burdock HOLLAND/ DR TIM DAVIS   AND REPAIR VENTRAL HERNIA   LASER ABLATION CONDOLAMATA N/A 04/26/2013   Procedure: LASER ABLATION CONDOLAMATA;  Surgeon: Romie Levee, MD;  Location: [facility];  Service: General;  Laterality: N/A;   TONSILLECTOMY     TOTAL KNEE ARTHROPLASTY Left 11/05/2015   Procedure: LEFT TOTAL KNEE ARTHROPLASTY;  Surgeon: Jene Every, MD;  Location: [facility];  Service: Orthopedics;  Laterality: Left;   HPI:  61 yo female adm to [facility] with fever = found to have  pna on CXR. Pt with medical history significant of MS, on immunosuppressive treatment.  CT chest showed Several focal nodular ground-glass infiltrates are  demonstrated scattered throughout the right lung, mostly in the  superior segment. Largest of these measures 1.8 x 3.1 cm in diameter  and accounts for the chest radiographic abnormality. No cavitation  is demonstrated. These changes are most likely to represent  multifocal pneumonia. Follow-up after resolution is recommended to  exclude underlying mass lesion. The left lung is clear. No pleural  effusions. No pneumothorax. Airways are patent.   Pt also has h/o ADD, anxiety and depression per chart review.  Swallow evaluation ordered due to pt's CT chest findings. Pt admits to some dysphagia stating her mechanism is fine but she has a hard time "firing".  She also endorses occasional issues with food/pills being coughed back up - not accompanied by frothy secretions.  Remote h/o requiring heimlich manuever for potential aspiration of a basil leaf.   Pt reports she was bedridden the week prior to hospitalization due to fatigue.  Reviewed 3 pillars of aspiration pna with pt including immunocompromised, poor dentition, dysphagia - of which pt has 2 risk factors.    Assessment / Plan / Recommendation  Clinical Impression  Normal oropharyngeal swallow ability based on clinical evaluation/judgement.  Pt does report consistently feeling like she has a "hairball" in her throat -  globus.  Pt easily passed a Yale 3 ounce water challenge.  No oral retention nor increased work of breathing noted with all po intake.   SLP administered Reflux Symptom Index screen  by Belafsky to determine potential LPR (laryngopharyngeal reflux). Pt scored 13/45 - potential for LPR.  She denies having any reflux symptoms.   On prior MBS in 08/2010 pt had a cervical osteophyte at her UES - SLP questions if this may be more of an issue now.  Recommend she follow up with her GI MD *Dr  Bosie Clos* as an OP for potential assessment given her recurrent pna *per her statement*.  Pt also admits she cannot cough and clear secretions and has not been able do conduct since her MS.   SLP advised she continue to work on her Incentive Spirometer to improve cough strength.  Reviewed alternative ways to consume medications.  Thanks for this consult. SLP Visit Diagnosis: Dysphagia, unspecified (R13.10)    Aspiration Risk  Mild aspiration risk    Diet Recommendation Regular;Thin liquid   Liquid Administration via: Cup;Straw Medication Administration: Whole meds with liquid Supervision: Patient able to self feed Compensations: Slow rate;Small sips/bites Postural Changes: Seated upright at 90 degrees;Remain upright for at least 30 minutes after po intake    Other  Recommendations      Recommendations for follow up therapy are one component of a multi-disciplinary discharge planning process, led by the attending physician.  Recommendations may be updated based on patient status, additional functional criteria and insurance authorization.  Follow up Recommendations No SLP follow up      Assistance Recommended at Discharge None  Functional Status Assessment Patient has not had a recent decline in their functional status  Frequency and Duration            Prognosis        Swallow Study   General HPI: 61 yo female adm to [facility] with fever = found to have pna on CXR. Pt with medical history significant of MS, on immunosuppressive treatment.  CT chest showed Several focal nodular ground-glass infiltrates are  demonstrated scattered throughout the right lung, mostly in the  superior segment. Largest of these measures 1.8 x 3.1 cm in diameter  and accounts for the chest radiographic abnormality. No cavitation  is demonstrated. These changes are most likely to represent  multifocal pneumonia. Follow-up after resolution is recommended to  exclude underlying mass lesion. The left lung is clear.  No pleural  effusions. No pneumothorax. Airways are patent.   Pt also has h/o ADD, anxiety and depression per chart review.  Swallow evaluation ordered due to pt's CT chest findings. Pt admits to some dysphagia stating her mechanism is fine but she has a hard time "firing".  She also endorses occasional issues with food/pills being coughed back up - not accompanied by frothy secretions.  Remote h/o requiring heimlich manuever for potential aspiration of a basil leaf.   Pt reports she was bedridden the week prior to hospitalization due to fatigue.  Reviewed 3 pillars of aspiration pna with pt including immunocompromised, poor dentition, dysphagia - of which pt has 2 risk factors. Type of Study: Bedside Swallow Evaluation Previous Swallow Assessment: Normal MBS at [facility] 2011/9 Diet Prior to this Study: Regular;Thin liquids Temperature Spikes Noted: No Respiratory Status: Room air History of Recent Intubation: No Behavior/Cognition: Alert;Cooperative;Pleasant mood Oral Cavity Assessment: Within Functional Limits Oral Care Completed by SLP: No Oral Cavity - Dentition: Adequate natural dentition Vision: Functional for self-feeding Self-Feeding Abilities:  Able to feed self Patient Positioning: Upright in bed Baseline Vocal Quality: Normal Volitional Cough: Strong Volitional Swallow: Able to elicit    Oral/Motor/Sensory Function Overall Oral Motor/Sensory Function: Within functional limits   Ice Chips Ice chips: Not tested   Thin Liquid Thin Liquid: Within functional limits Presentation: Cup;Self Fed Other Comments: 3 ounce Yale water challenge easily passed    Nectar Thick Nectar Thick Liquid: Not tested   Honey Thick Honey Thick Liquid: Not tested   Puree Puree: Within functional limits Presentation: Self Fed;Spoon   Solid     Solid: Within functional limits Presentation: Self Orvan July 01/13/2022,12:04 PM   Rolena Infante, MS [facility] SLP Acute Rehab Services Office  [phone] Cell [phone]

## 2022-01-13 NOTE — Plan of Care (Signed)

## 2022-01-14 LAB — LEGIONELLA PNEUMOPHILA SEROGP 1 UR AG: L. pneumophila Serogp 1 Ur Ag: NEGATIVE

## 2022-01-14 NOTE — Telephone Encounter (Signed)
Transition Care Management Follow-up Telephone Call Date of discharge and from where: 01/13/22 From [facility]  How have you been since you were released from the hospital? Patient states that she is doing better but really tired.  Any questions or concerns? No  Items Reviewed: Did the pt receive and understand the discharge instructions provided? Yes  Medications obtained and verified? Yes  Other?  N/a Any new allergies since your discharge? No  Dietary orders reviewed? Yes Do you have support at home? Yes   Home Care and Equipment/Supplies: Were home health services ordered? not applicable If so, what is the name of the agency? N/a  Has the agency set up a time to come to the patient's home? not applicable Were any new equipment or medical supplies ordered?  No What is the name of the medical supply agency? N/a Were you able to get the supplies/equipment? no Do you have any questions related to the use of the equipment or supplies? No  Functional Questionnaire: (I = Independent and D = Dependent) ADLs: I  Bathing/Dressing- i  Meal Prep- i  Eating- i  Maintaining continence- i  Transferring/Ambulation- i  Managing Meds- i  Follow up appointments reviewed:  PCP Hospital f/u appt confirmed? Yes  Scheduled to see Dr Lenord Fellers on 2/16 @ 12. Specialist Hospital f/u appt confirmed? Yes  Needs to Schedule to see Dr Bosie Clos, no appt made yet.  Are transportation arrangements needed?  N/a If their condition worsens, is the pt aware to call PCP or go to the Emergency Dept.? Yes Was the patient provided with contact information for the PCP's office or ED? Yes Was to pt encouraged to call back with questions or concerns? Yes

## 2022-01-16 LAB — CULTURE, BLOOD (ROUTINE X 2)
Culture: NO GROWTH
Culture: NO GROWTH
Special Requests: ADEQUATE
Special Requests: ADEQUATE

## 2022-01-19 ENCOUNTER — Other Ambulatory Visit: Payer: Self-pay | Admitting: Neurology

## 2022-01-19 MED ORDER — HYDROCODONE-ACETAMINOPHEN 7.5-325 MG PO TABS: ORAL_TABLET | ORAL | 0 refills | Status: DC

## 2022-01-19 NOTE — Telephone Encounter (Signed)
Received refill request for Norco 7.5-325mg . Last OV was on 10/14/21.  Next OV is scheduled for 02/24/22 .  Last RX was written on 12/23/21 for 180 tabs.   [facility] has been reviewed.

## 2022-01-20 ENCOUNTER — Ambulatory Visit: Payer: 59

## 2022-01-20 ENCOUNTER — Ambulatory Visit: Payer: 59 | Admitting: Internal Medicine

## 2022-01-20 ENCOUNTER — Encounter: Payer: Self-pay | Admitting: Internal Medicine

## 2022-01-20 ENCOUNTER — Other Ambulatory Visit: Payer: Self-pay

## 2022-01-20 ENCOUNTER — Ambulatory Visit
Admission: RE | Admit: 2022-01-20 | Discharge: 2022-01-20 | Disposition: A | Discharge status: Home / Self Care | Payer: 59 | Source: Ambulatory Visit | Attending: Internal Medicine | Admitting: Internal Medicine

## 2022-01-20 VITALS — BP 122/80 | HR 67 | Temp 97.9°F | Wt 134.8 lb

## 2022-01-20 DIAGNOSIS — J189 Pneumonia, unspecified organism: Secondary | ICD-10-CM | POA: Diagnosis not present

## 2022-01-20 NOTE — Progress Notes (Signed)
° °  Subjective:  ° ° Patient ID: [name], female    DOB: 07/07/1961, 60 y.o.   MRN: [id number] ° ° Here for hospital follow up on pneumonia. Was hospitalized February 2-February 9th Currently, patient is not coughing. No longer febrile. Slowly getting energy back. Has some concerns about swallowing evaluation in hospital but passed that evaluation without difficulty.this was researched and explained to her in detail today.  I see no further follow-up is necessary at this time.  Has no dysphagia.  No odynophagia. She  thinks she is sup[posed to follow up with Dr. Schooler about swallowing or an osteophyte issue from hospitalization at [facility].  I assume there was consideration of aspiration pneumonia with her history of multiple sclerosis. ° °Repeat chest x-ray on February 16 showed improvement in right mid and lower lung patchy airspace disease compared to February 7. ° ° °Has follow up with [name] at [facility] on March regarding multiple sclerosis treatment.  Interpretation was resolving right lower lobe pneumonia.  She should have an additional follow-up in 4 weeks. ° °Was advised by hospitalist to hold him his meds until symptoms improve and completion of antibiotic therapy. ° °[facility], pain with inspiration.  CT was consistent with multifocal right lower lobe pneumonia.  She was treated with ceftriaxone and azithromycin.  Was discharged home on amoxicillin and azithromycin.  Was instructed to hold multiple sclerosis meds until completion of antibiotic therapy.  She is on Tecfidera for multiple sclerosis.  Says she is feeling better but still has lack of energy.  She looks fatigued.  She is not tachypneic.  It was recommended that she have repeat CT of chest with contrast after resolution of acute process to exclude underlying mass lesion.  She has 4-week follow-up in March. °Review of Systems °See above-has fatigue but no  shortness of breath.  No hemoptysis.  Beginning to get a bit better. °   °Objective:  ° Physical Exam °Blood pressure 122/80 pulse 67 temperature 97.9 pulse oximetry 97% weight 134 pounds 12 ounces BMI 22.42 ° °She looks a bit pale and fatigued.  Chest is clear to auscultation.  TMs clear.  Pharynx clear.  Cardiac exam: Regular rate and rhythm.  No lower extremity edema. ° °   °Assessment & Plan:  °Multifocal right lower lobe pneumonia-chest x-ray today shows improvement follow-up in 4 weeks. ° °History of multiple sclerosis treated with Tecfidera.  Has been instructed to hold Tecfidera until completion of antibiotic therapy by hospitalist °Concur with that.  ° °Fatigue due to pneumonia and multiple sclerosis ° °Labs requested by hospitalist include CBC with differential and c-Met.  BUN and creatinine are normal.  Electrolytes are normal.  White blood cell count 4600, hemoglobin 14.4 g platelet count 461,000. ° °Plan: Follow-up in 4 weeks.  Obtain repeat chest x-ray and if not clear obtain CT scan to rule out pathology in right lung. ° °

## 2022-01-21 ENCOUNTER — Other Ambulatory Visit: Payer: Self-pay

## 2022-01-21 DIAGNOSIS — J189 Pneumonia, unspecified organism: Secondary | ICD-10-CM

## 2022-01-21 LAB — CBC WITH DIFFERENTIAL/PLATELET
Absolute Monocytes: 331 cells/uL (ref 200–950)
Basophils Absolute: 51 cells/uL (ref 0–200)
Basophils Relative: 1.1 %
Eosinophils Absolute: 32 cells/uL (ref 15–500)
Eosinophils Relative: 0.7 %
HCT: 43.4 % (ref 35.0–45.0)
Hemoglobin: 14.4 g/dL (ref 11.7–15.5)
Lymphs Abs: 1536 cells/uL (ref 850–3900)
MCH: 30.2 pg (ref 27.0–33.0)
MCHC: 33.2 g/dL (ref 32.0–36.0)
MCV: 91 fL (ref 80.0–100.0)
MPV: 9.9 fL (ref 7.5–12.5)
Monocytes Relative: 7.2 %
Neutro Abs: 2650 cells/uL (ref 1500–7800)
Neutrophils Relative %: 57.6 %
Platelets: 461 10*3/uL — ABNORMAL HIGH (ref 140–400)
RBC: 4.77 10*6/uL (ref 3.80–5.10)
RDW: 12.4 % (ref 11.0–15.0)
Total Lymphocyte: 33.4 %
WBC: 4.6 10*3/uL (ref 3.8–10.8)

## 2022-01-21 LAB — COMPLETE METABOLIC PANEL WITH GFR
AG Ratio: 1.3 (calc) (ref 1.0–2.5)
ALT: 16 U/L (ref 6–29)
AST: 18 U/L (ref 10–35)
Albumin: 4.1 g/dL (ref 3.6–5.1)
Alkaline phosphatase (APISO): 86 U/L (ref 37–153)
BUN: 15 mg/dL (ref 7–25)
CO2: 33 mmol/L — ABNORMAL HIGH (ref 20–32)
Calcium: 9.8 mg/dL (ref 8.6–10.4)
Chloride: 101 mmol/L (ref 98–110)
Creat: 0.84 mg/dL (ref 0.50–1.05)
Globulin: 3.1 g/dL (calc) (ref 1.9–3.7)
Glucose, Bld: 105 mg/dL — ABNORMAL HIGH (ref 65–99)
Potassium: 5 mmol/L (ref 3.5–5.3)
Sodium: 139 mmol/L (ref 135–146)
Total Bilirubin: 0.4 mg/dL (ref 0.2–1.2)
Total Protein: 7.2 g/dL (ref 6.1–8.1)
eGFR: 79 mL/min/{1.73_m2} (ref >=60)

## 2022-01-27 ENCOUNTER — Telehealth: Payer: Self-pay | Admitting: Neurology

## 2022-01-27 DIAGNOSIS — G35 Multiple sclerosis: Secondary | ICD-10-CM

## 2022-01-27 DIAGNOSIS — G8929 Other chronic pain: Secondary | ICD-10-CM

## 2022-01-27 NOTE — Telephone Encounter (Signed)
Called pt back. She went to hospital 01/11/22-01/13/22 for pneumonia. Received 1 week oral antibiotics when d/c'd home.  Went back for f/u xray last week on 01/20/22.  Improving but not fully improved. Going back in a couple weeks for repeat xray. They told her to call back if sx worsen and will call in more antibiotics at that point if needed.  She is having a hard time getting generic adderall that she has been on d/t Sport and exercise psychologist. Been taking less of adderall to make it last longer. Takes 1/2 am, 1/2 afternoon. Pharmacy has another generic but makes her feel worse, causes HA/nausea and does not want this. Has been alternating adderall with left over methylphenidate she had from May, does not work as well. Pharmacy has name brand Ritalin but would cost over 200.00/unable to afford. Wondering what she should do moving forward. Concerned about not getting medication d/t shortage.   I scheduled earlier f/u for 02/03/22 at 9am with Dr. Epimenio Foot. She wants to keep other appt w/ AL,NP 02/24/22. She will call back Monday to confirm she can make 02/03/22 appt and at that point will cx appt with Amy.

## 2022-01-27 NOTE — Telephone Encounter (Signed)
Pt states when she was in the hospital the early part of this month they told her to come off of her TECFIDERA 240 MG CPDR.  Pt would like a call to discuss going back on that medication, pt also has concerns about her :amphetamine-dextroamphetamine (ADDERALL) 20 MG tablet she'd like to discuss

## 2022-01-31 NOTE — Telephone Encounter (Signed)
Dr. Epimenio Foot- Did you want her to continue off Tecfidera for now?  I called and spoke w/ pt. She will keep calling around to other pharmacies. If able to find pharmacy that has adderall in stock that she is wanting, she will call us to e-scribe to that pharmacy.   She cannot make 02/03/22 appt. Cx. She will keep 02/24/22 appt w/ AL,NP. I did offer 01/11/22 at 2pm instead w/ Dr. Epimenio Foot but she states husband has appt that day, unable to take.   She has stopped Tecfidera. Wanting to confirm pneumonia is cleared up prior to starting back on this. Denis any coughing. Currently experiencing severe fatigue, weakness, heaviness in chest, intermittent labored breathing. She has repeat chest xray next week. Aware I will send to Dr. Epimenio Foot to review and will send update via mychart.

## 2022-01-31 NOTE — Patient Instructions (Signed)
Chest x-ray shows improvement.  Return in 4 weeks.  White blood cell count is normal and hemoglobin is stable.  Electrolytes are normal.  Follow-up in 4 weeks here.

## 2022-02-03 ENCOUNTER — Ambulatory Visit: Payer: 59 | Admitting: Neurology

## 2022-02-04 ENCOUNTER — Ambulatory Visit: Payer: 59 | Admitting: Internal Medicine

## 2022-02-04 ENCOUNTER — Encounter: Payer: Self-pay | Admitting: Internal Medicine

## 2022-02-04 ENCOUNTER — Other Ambulatory Visit: Payer: Self-pay

## 2022-02-04 VITALS — BP 138/92 | HR 67 | Temp 97.6°F

## 2022-02-04 DIAGNOSIS — G35 Multiple sclerosis: Secondary | ICD-10-CM | POA: Diagnosis not present

## 2022-02-04 DIAGNOSIS — J189 Pneumonia, unspecified organism: Secondary | ICD-10-CM

## 2022-02-04 DIAGNOSIS — R5383 Other fatigue: Secondary | ICD-10-CM | POA: Diagnosis not present

## 2022-02-04 MED ORDER — METHYLPREDNISOLONE ACETATE 80 MG/ML IJ SUSP
80.0000 mg | Freq: Once | INTRAMUSCULAR | Status: AC
  Administered 2022-02-04: 80 mg via INTRAMUSCULAR

## 2022-02-04 NOTE — Patient Instructions (Addendum)
Depo-Medrol 80 mg IM.  Patient to let me know if not improving in a few days.  We can repeat chest x-ray if not improving next week. ?

## 2022-02-04 NOTE — Progress Notes (Signed)
? ?  Subjective:  ? ? Patient ID: [name], female    DOB: 06-28-1961, 61 y.o.   MRN: [id number] ? ?HPI 61 year old Female seen with fatigue.  She was hospitalized on February 7 with community-acquired pneumonia.  She was discharged home on February 9.  She presented to the emergency department at [facility] with cough, pain with inspiration and CT scan was consistent with multifocal pneumonia.  She was treated with ceftriaxone and azithromycin.  She was discharged home on amoxicillin and azithromycin.  She was seen in follow-up here on February 16 and had chest x-ray as requested by hospitalist which showed improvement in right mid and lower lung patchy interspace disease.  Interpretation was resolving right lower lobe pneumonia.  Can take a month or more for pneumonia to improve on chest x-ray. ? ? ? ?Review of Systems she still feeling fatigued.  Not coughing.  No fever or chills. ? ?   ?Objective:  ? Physical Exam ? ?Blood pressure 138/92 pulse 67 regular temperature 97.6 degrees pulse oximetry 99%.  She looks fatigued.  Her chest is clear to auscultation without rales or wheezing. ? ? ?   ?Assessment & Plan:  ?Fatigue ? ?Multiple sclerosis ? ?Community-acquired pneumonia-x-ray showed resolving right lower lobe pneumonia ? ?Plan: Depo-Medrol 80 mg IM.  If patient not improving next week she is to let me know and we can certainly repeat chest x-ray. ? ?

## 2022-02-15 ENCOUNTER — Other Ambulatory Visit: Payer: Self-pay | Admitting: Neurology

## 2022-02-16 ENCOUNTER — Other Ambulatory Visit: Payer: Self-pay | Admitting: *Deleted

## 2022-02-16 MED ORDER — METHYLPHENIDATE HCL 10 MG PO TABS: 20.0000 mg | ORAL_TABLET | Freq: Three times a day (TID) | ORAL | 0 refills | Status: DC

## 2022-02-16 MED ORDER — HYDROCODONE-ACETAMINOPHEN 7.5-325 MG PO TABS: ORAL_TABLET | ORAL | 0 refills | Status: DC

## 2022-02-16 NOTE — Telephone Encounter (Signed)
Last OV was on 10/14/21.  ?Next OV is scheduled for 02/24/22 .  ?Last RX was written on 01/19/22 for 180 tabs.  ? ?[place] [facility] Database has been reviewed.  ?

## 2022-02-18 ENCOUNTER — Encounter: Payer: Self-pay | Admitting: Internal Medicine

## 2022-02-18 ENCOUNTER — Ambulatory Visit
Admission: RE | Admit: 2022-02-18 | Discharge: 2022-02-18 | Disposition: A | Discharge status: Home / Self Care | Payer: 59 | Source: Ambulatory Visit | Attending: Internal Medicine | Admitting: Internal Medicine

## 2022-02-18 ENCOUNTER — Other Ambulatory Visit: Payer: Self-pay

## 2022-02-18 ENCOUNTER — Ambulatory Visit: Payer: 59 | Admitting: Internal Medicine

## 2022-02-18 VITALS — BP 132/88 | HR 68 | Temp 97.7°F

## 2022-02-18 DIAGNOSIS — R1013 Epigastric pain: Secondary | ICD-10-CM | POA: Diagnosis not present

## 2022-02-18 DIAGNOSIS — J189 Pneumonia, unspecified organism: Secondary | ICD-10-CM

## 2022-02-18 DIAGNOSIS — G35 Multiple sclerosis: Secondary | ICD-10-CM

## 2022-02-18 NOTE — Progress Notes (Signed)
? ?  Subjective:  ? ? Patient ID: [name], female    DOB: 04-23-1961, 61 y.o.   MRN: [id number] ? ?HPI 61 year old Female with Multiple Sclerosis here for follow up on resolving right lower lobe pneumonia.  This was community-acquired.  She was hospitalized February 7 through February 9.  She was treated with ceftriaxone and azithromycin and was discharged home on amoxicillin and azithromycin.  Repeat chest x-ray on March 3 showed resolving right lower lobe pneumonia.  She was given Depo-Medrol 80 mg IM at that time for some persistent chest congestion and also it helps her fatigue with multiple sclerosis.  Explained to patient at that visit it could take some time to completely clear up on chest x-ray.  She had repeat chest x-ray today showing heart to be normal size.  Retrohilar airspace disease seen initially on chest CT 01/12/2022 is not visible on current study suggesting interval resolution.  Patient was reassured that pneumonia had resolved. ? ? ? ?Review of Systems denies fever, chills, cough or sputum production. Having some dyspepsis today. No waterbrash. Has been drinking coffee. Recommend OTC Pepcid. ? ?   ?Objective:  ? Physical Exam ? BP 132/88 ,pulse 68, T 97.7, pulse ox 99% ?Skin warm and dry.  No cervical adenopathy.  Chest is completely clear to auscultation without any rales or wheezing. ?  ? ?   ?Assessment & Plan:  ?Pneumonia resolved ? ?Multiple sclerosis ? ?Likely has GE reflux today and recommend over-the-counter Pepcid as needed. ? ?

## 2022-02-18 NOTE — Patient Instructions (Signed)
May take Pepcid over-the-counter for dyspepsia which is likely secondary to caffeine consumption.  Chest x-ray today shows resolution of right lower lobe pneumonia.  Your chest is completely clear upon auscultation.  Return as needed. ?

## 2022-02-24 ENCOUNTER — Ambulatory Visit: Payer: 59 | Admitting: Family Medicine

## 2022-02-24 NOTE — Patient Instructions (Incomplete)

## 2022-02-24 NOTE — Progress Notes (Deleted)
? ? ?No chief complaint on file. ? ? ? ?HISTORY OF PRESENT ILLNESS: ?02/24/22 ALL:  ?[name] is a 61 y.o. female here today for follow up for RRMS. She continues Tecfidera and is tolerating it well. Labs have been stable. MRI 02/2020 stable.  ? ?No new or exacerbating symptoms. She has had more fatigue. She continues methylphenidate 20mg  TID. She requested switch due to supply issues with Adderall. Pain controlled on hydrocodone 7.5-325mg  two tablets every 8 hours as needed. PDMP appropriate.  ? ?Hyoscyamine as needed helps with urinary frequency.  ? ?Alprazolam 0.5mg  up to TID as needed for anxiety and sleep.  ? ?She continues vitamin D and B12 supplements. She has been taking 50,000iu weekly of D and twice monthly of B12.  ? ? ?HISTORY (copied from Dr previous note) ? ?[name] is a 61 y.o. woman with relapsing remitting multiple sclerosis diagnosed in 2008.   ?  ?Update 10/14/21: ?She is on Tecfidera and tolerates it well.   She feels more clumsy and much more fatigued.   She felt better on Tysabri but was JCV Ab positive.   Lymphocytes 03/31/2021  was good at 1.2.  LFTs were fine ?  ?She feels her MS is stable.   She notes gait is worse but has more right knee issues as well.  Right leg is weaker than left and she has a foot drop at times.  She tires out very easily.   She wakes up to urinate 3-4 times a night which is better.    She wears a Poise pad due to occasional incontinence..    She usually falls back asleep easily.   She denies snoring.   She feels less depressed off any antidepressant ?  ?She has had more fatigue. Brand name Ritalin helps her ADD the most but is not covered.    Adderall 20 mg po tid works better than methylphenidate.    ?  ?She has pain in her back and spasms in back and legs, helped by hydrocodone (she takes 6 x 7.5 mg daily).  She is compliant (PDMP has been checked).  When pain flares, she feels her walking does worse.    ?  ?Vit D 50000 units causes  heart racing but lower dose daily is tolerated.       Vit B12 was also low in the past.    ?    ?MS History:  In 2008, she had an MRI of the brain performed which was consistent with multiple sclerosis and she was diagnosed with relapsing remitting MS. He was initially placed on Avonex for therapy. She had some exacerbations with weakness and clumsiness with her gait and she started to see Dr. 2009. He placed her on Tysabri she is on Tysabri between 2010 and 2013. 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  ?  ?Imaging Review: ?12/13/17 MRI of the cervical and thoracic spine:  She has multiple T2 hyperintense lesions in the spinal cord the largest to the right C2-C3 and smaller ones at C3-C4, C4, C5-C6, C6-C7 and T1-T2 all the foci were present compared to the 2016 MRI.   The MRI of the thoracic spine showed small foci adjacent to T2 and T5-6.  None of the spinal plaques appeared to be acute.   ?  ?MRI 02/25/2020 brain:   Multiple T2/FLAIR hyperintense foci in the  hemispheres in a pattern and configuration consistent with chronic demyelinating plaque associated with multiple sclerosis.  None of the foci appears to be acute.  Compared to the MRI from 03/18/2017, there is no interval change.  ?  ?MRI 2016, 2017 and 2018 showed no new lesions ? ?REVIEW OF SYSTEMS: Out of a complete 14 system review of symptoms, the patient complains only of the following symptoms, chronic pain, fatigue, anxiety, insomnia and all other reviewed systems are negative. ? ? ? ?ALLERGIES: ?Allergies  ?Allergen Reactions  ? Ibuprofen Other (See Comments)  ?  CAUSES LYMPHEDEMA  ? Nsaids Other (See Comments)  ?  AVOIDS ANY SODIUM-CONTAINING MED. -- CAUSES LYMPHEDEMA  ? Other Swelling and Other (See Comments)  ?  Laxatives and artificial sweeteners- cause leg swelling  ? Vitamin D Analogs Palpitations and Other (See Comments)  ?  Cannot tolerate at  50,000 units OR even 2,000 units  ? ? ? ?HOME MEDICATIONS: ?Outpatient Medications Prior to Visit  ?Medication Sig Dispense Refill  ? ALPRAZolam (XANAX) 0.5 MG tablet TAKE 1 TABLET BY MOUTH 3 TIMES A DAY AS NEEDED FOR ANXIETY/SLEEP (Patient taking differently: Take 0.5 mg by mouth 3 (three) times daily as needed for sleep or anxiety.) 90 tablet 2  ? cyanocobalamin (,VITAMIN B-12,) 1000 MCG/ML injection INJECT 1 ML TWICE PER MONTH (Patient taking differently: Inject 1,000 mcg into the skin every 14 (fourteen) days.) 2 mL 11  ? HYDROcodone-acetaminophen (NORCO) 7.5-325 MG tablet TAKE 2 TABLETS BY MOUTH EVERY 8 HOURS AS NEEDED 180 tablet 0  ? hyoscyamine (LEVSIN SL) 0.125 MG SL tablet Place 1 tablet (0.125 mg total) under the tongue every 4 (four) hours as needed. (Patient taking differently: Place 0.125 mg under the tongue every 4 (four) hours as needed for cramping.) 270 tablet 3  ? methylphenidate (RITALIN) 10 MG tablet Take 2 tablets (20 mg total) by mouth 3 (three) times daily with meals. 180 tablet 0  ? metoprolol tartrate (LOPRESSOR) 50 MG tablet TAKE 1/2 TABLET BY MOUTH EVERY 12 HOURS AS NEEDED FOR PALPITATIONS (Patient taking differently: Take 25 mg by mouth every 12 (twelve) hours as needed ("for palpitations").) 90 tablet 1  ? Syringe/Needle, Disp, (SYRINGE 3CC/23GX1") 23G X 1" 3 ML MISC 3 Syringes by Does not apply route every 30 (thirty) days. (Patient taking differently: 3 Syringes by Does not apply route every 30 (thirty) days. 26Gx5/6") 3 each 3  ? TECFIDERA 240 MG CPDR TAKE 1 CAPSULE (240MG ) BY  MOUTH TWICE DAILY 60 capsule 11  ? ?No facility-administered medications prior to visit.  ? ? ? ?PAST MEDICAL HISTORY: ?Past Medical History:  ?Diagnosis Date  ? ADD (attention deficit disorder)   ? Anxiety   ? Arthritis   ? Bladder incontinence   ? wears a pad  ? Bone spur   ? cervical bone spurs  ? Chronic fatigue   ? Dysrhythmia   ? hx A-FIB  ? Generalized neuropathy   ? SECONDARY TO MS  ? Headache(784.0)    ? History of concussion YOUNG ADULT--  NO RESIDUAL  ? IBS (irritable bowel syndrome)   ? Movement disorder   ? MS (multiple sclerosis) (HCC)   ? MVP (mitral valve prolapse) HX HEART RACING  YRS AGO  ? ASYMPTOMATIC SINCE THEN  ? Vision abnormalities   ? Weakness of right leg SECONDARY TO MS  ? ? ? ?PAST SURGICAL HISTORY: ?Past Surgical History:  ?Procedure Laterality Date  ? CESAREAN SECTION  X3  ? BILATERAL TUBAL LIGATION W/  LAST ONE  ? DESTRUCTION OF ANAL CONDYLOMA  07-07-2005  DR TOTH  ? HERNIA REPAIR    ? KNEE ARTHROSCOPY WITH LATERAL MENISECTOMY  10/26/2012  ? Procedure: KNEE ARTHROSCOPY WITH LATERAL MENISECTOMY;  Surgeon: Javier Docker, MD;  Location: [facility];  Service: Orthopedics;  Laterality: Left;  Left Knee Arthrsocopy with Partial Lateral Menisectomy  ? LAPAROSCOPIC ASSISTED VAGINAL HYSTERECTOMY  01-15-2002  DR Gerlene Burdock HOLLAND/ DR TIM DAVIS  ? AND REPAIR VENTRAL HERNIA  ? LASER ABLATION CONDOLAMATA N/A 04/26/2013  ? Procedure: LASER ABLATION CONDOLAMATA;  Surgeon: Romie Levee, MD;  Location: [facility];  Service: General;  Laterality: N/A;  ? TONSILLECTOMY    ? TOTAL KNEE ARTHROPLASTY Left 11/05/2015  ? Procedure: LEFT TOTAL KNEE ARTHROPLASTY;  Surgeon: Jene Every, MD;  Location: [facility];  Service: Orthopedics;  Laterality: Left;  ? ? ? ?FAMILY HISTORY: ?Family History  ?Problem Relation Age of Onset  ? Diabetes Mother   ? Hypertension Mother   ? Stroke Mother   ? Bowel Disease Brother   ? Diabetes Brother   ? Healthy Brother   ? Emphysema Father   ? Heart attack Father   ? ? ? ?SOCIAL HISTORY: ?Social History  ? ?Socioeconomic History  ? Marital status: Married  ?  Spouse name: Not on file  ? Number of children: 3  ? Years of education: Not on file  ? Highest education level: Not on file  ?Occupational History  ? Not on file  ?Tobacco Use  ? Smoking status: Never  ? Smokeless tobacco: Never  ?Substance and Sexual Activity  ? Alcohol use: No  ? Drug use: No  ?  Sexual activity: Not on file  ?Other Topics Concern  ? Not on file  ?Social History Narrative  ? Not on file  ? ?Social Determinants of Health  ? ?Financial Resource Strain: Not on file  ?Food Insecurity: Not on fi

## 2022-03-16 ENCOUNTER — Other Ambulatory Visit: Payer: Self-pay | Admitting: Neurology

## 2022-03-16 MED ORDER — METHYLPHENIDATE HCL 10 MG PO TABS: 20.0000 mg | ORAL_TABLET | Freq: Three times a day (TID) | ORAL | 0 refills | Status: DC

## 2022-03-16 MED ORDER — HYDROCODONE-ACETAMINOPHEN 7.5-325 MG PO TABS: ORAL_TABLET | ORAL | 0 refills | Status: DC

## 2022-03-16 NOTE — Telephone Encounter (Signed)
Last OV was on 10/14/21.  ?Next OV is scheduled for 03/22/22.  ?Last RX for was written on 02/16/22 and 02/17/22 for 180 tabs.  ? ?[place] Drug Database has been reviewed. Ok to fill both Rx.  ?

## 2022-03-17 ENCOUNTER — Other Ambulatory Visit: Payer: Self-pay | Admitting: *Deleted

## 2022-03-17 MED ORDER — HYDROCODONE-ACETAMINOPHEN 7.5-325 MG PO TABS: ORAL_TABLET | ORAL | 0 refills | Status: DC

## 2022-03-21 NOTE — Patient Instructions (Addendum)
Below is our plan: ? ?We will continue current treatment plan. We will discuss continuation of DMT with Dr Felecia Shelling. I have refilled alprazolam 0.25mg  daily at bedtime as needed.  ? ?Please make sure you are staying well hydrated. I recommend 50-60 ounces daily. Well balanced diet and regular exercise encouraged. Consistent sleep schedule with 6-8 hours recommended.  ? ?Please continue follow up with care team as directed.  ? ?Follow up with Dr Felecia Shelling in 4 months  ? ?You may receive a survey regarding today's visit. I encourage you to leave honest feed back as I do use this information to improve patient care. Thank you for seeing me today!  ? ? ?

## 2022-03-21 NOTE — Progress Notes (Addendum)
? ?PATIENT: [name] ?DOB: 09-16-1961 ? ?REASON FOR VISIT: follow up ?HISTORY FROM: patient ? ?Virtual Visit via Telephone Note ? ?I connected with [name] on 03/22/22 at  8:15 AM EDT by telephone and verified that I am speaking with the correct person using two identifiers. ?  ?I discussed the limitations, risks, security and privacy concerns of performing an evaluation and management service by telephone and the availability of in person appointments. I also discussed with the patient that there may be a patient responsible charge related to this service. The patient expressed understanding and agreed to proceed. ? ? ?History of Present Illness: ? ?03/22/22 ALL: ?[name] is a 61 y.o. female here today for follow up for RRMS. She was last seen by Dr Felecia Shelling 10/2021. Advised to continue Tecfidera. She reports stopping Tecfidera in 01/2022 following hospitalization for pna. She has not resumed DMT. She reports feeling better off DMT. Labs have been stable. MRI 02/2020 stable.  ? ?No new or exacerbating symptoms. She has had more fatigue. She continues methylphenidate 20mg  TID. She requested switch due to supply issues with Adderall. She feels she is doing better on methylphenidate. Adderall made her feel poorly. Pain controlled on hydrocodone 7.5-325mg  two tablets every 8 hours as needed. PDMP appropriate. Last refilled 03/18/2022.  ? ?Hyoscyamine as needed helps with urinary frequency. She feels that it gives her a headache.  ? ?She was previously taking alprazolam 0.5mg  up to TID as needed for anxiety and sleep. She has not used it in many months, maybe close to 2 years. After hospitalization she resumed 1/2 tablet daily at bedtime and feels that she is sleeping much better. She also feels that pain has improved.  ? ?She continues vitamin D and B12 supplements. She has been taking 50,000iu weekly of D and 1033mcg twice monthly of B12.  ? ? ?Observations/Objective: ? ?Generalized: Well developed, in  no acute distress  ?Mentation: Alert oriented to time, place, history taking. Follows all commands speech and language fluent ? ? ?Assessment and Plan: ? ?61 y.o. year old female  has a past medical history of ADD (attention deficit disorder), Anxiety, Arthritis, Bladder incontinence, Bone spur, Chronic fatigue, Dysrhythmia, Generalized neuropathy, Headache(784.0), History of concussion (YOUNG ADULT--  NO RESIDUAL), IBS (irritable bowel syndrome), Movement disorder, MS (multiple sclerosis) (HCC), MVP (mitral valve prolapse) (HX HEART RACING  YRS AGO), Vision abnormalities, and Weakness of right leg (SECONDARY TO MS). here with ? ?  ICD-10-CM   ?1. Multiple sclerosis ([place])  G35   ?  ?2. High risk medication use  Z79.899   ?  ?3. Chronic prescription opiate use  Z79.891   ?  ?4. Attention deficit disorder, unspecified hyperactivity presence  F98.8   ?  ?5. Chronic fatigue  R53.82   ?  ?6. Vitamin D deficiency  E55.9   ?  ? ? ?[name] is doing well, today. We have discussed DMT therapy. She reports feeling better off DMT and wishes to discontinue. I will discuss with Dr Felecia Shelling.  Labs reviewed in Epic from PCP. PDMP shows appropriate refills of hydrocodone and methylphenidate. No refills of alprazolam in PDMP. She feels that 0.25mg  helps with sleep and pain. She may continue as needed. Healthy lifestyle habits encouraged. She will follow up with Dr Felecia Shelling in 4 months.  ? ?No orders of the defined types were placed in this encounter. ? ? ?No orders of the defined types were placed in this encounter. ? ? ? ?Follow Up Instructions: ? ?  I discussed the assessment and treatment plan with the patient. The patient was provided an opportunity to ask questions and all were answered. The patient agreed with the plan and demonstrated an understanding of the instructions. ?  ?The patient was advised to call back or seek an in-person evaluation if the symptoms worsen or if the condition fails to improve as anticipated. ? ?I provided 30  minutes of non-face-to-face time during this encounter. Patient located at their place of residence during [place] visit. Provider is in the office.  ? ? ?Debbora Presto, NP  ? ? ?Often, MS will become less active with age and she can try going off of a disease modifying therapy.  We should check an MRI in about a year to make sure that there is not significant subclinical progression.  She should also call us if there are new or worsening symptoms. ? ?I have read the note, and I agree with the clinical assessment and plan. ? ?Richard A. Felecia Shelling, MD, PhD, Charlynn Grimes ?Certified in Neurology, Clinical Neurophysiology, Sleep Medicine, Pain Medicine and Neuroimaging ? ?[facility] ?[address], [address] ?[place],  [address] ?[phone] ? ?

## 2022-03-22 ENCOUNTER — Telehealth (INDEPENDENT_AMBULATORY_CARE_PROVIDER_SITE_OTHER): Payer: 59 | Admitting: Family Medicine

## 2022-03-22 ENCOUNTER — Encounter: Payer: Self-pay | Admitting: Family Medicine

## 2022-03-22 DIAGNOSIS — Z79899 Other long term (current) drug therapy: Secondary | ICD-10-CM

## 2022-03-22 DIAGNOSIS — F988 Other specified behavioral and emotional disorders with onset usually occurring in childhood and adolescence: Secondary | ICD-10-CM

## 2022-03-22 DIAGNOSIS — G35 Multiple sclerosis: Secondary | ICD-10-CM | POA: Diagnosis not present

## 2022-03-22 DIAGNOSIS — Z79891 Long term (current) use of opiate analgesic: Secondary | ICD-10-CM | POA: Diagnosis not present

## 2022-03-22 DIAGNOSIS — E559 Vitamin D deficiency, unspecified: Secondary | ICD-10-CM

## 2022-03-22 DIAGNOSIS — R5382 Chronic fatigue, unspecified: Secondary | ICD-10-CM

## 2022-03-22 MED ORDER — ALPRAZOLAM 0.25 MG PO TABS: 0.2500 mg | ORAL_TABLET | Freq: Every evening | ORAL | 1 refills | Status: DC | PRN

## 2022-03-23 ENCOUNTER — Encounter: Payer: Self-pay | Admitting: Family Medicine

## 2022-04-12 ENCOUNTER — Other Ambulatory Visit: Payer: Self-pay | Admitting: Internal Medicine

## 2022-04-13 ENCOUNTER — Other Ambulatory Visit: Payer: Self-pay | Admitting: Neurology

## 2022-04-13 MED ORDER — HYDROCODONE-ACETAMINOPHEN 7.5-325 MG PO TABS: ORAL_TABLET | ORAL | 0 refills | Status: DC

## 2022-04-13 MED ORDER — METHYLPHENIDATE HCL 10 MG PO TABS: 20.0000 mg | ORAL_TABLET | Freq: Three times a day (TID) | ORAL | 0 refills | Status: DC

## 2022-04-13 NOTE — Telephone Encounter (Signed)
Last OV was on 03/22/22.  ?Next OV is scheduled for 08/04/22.  ?Last RX was written on 03/18/22 for 180 tabs for both Rx.  ? ?[place] Drug Database has been reviewed. Fill dates changed to 04/15/22 for compliance. Please e-scribe as work in MD.  ?

## 2022-04-15 ENCOUNTER — Telehealth: Payer: Self-pay | Admitting: Family Medicine

## 2022-04-15 NOTE — Telephone Encounter (Signed)
Pt called stating that her local pharmacy does not have her HYDROcodone-acetaminophen ([place]) 7.5-325 MG tablet in stock and she is wanting to know if the prescription can be sent to the [facility] [place]. [phone] ?

## 2022-04-18 MED ORDER — HYDROCODONE-ACETAMINOPHEN 7.5-325 MG PO TABS: ORAL_TABLET | ORAL | 0 refills | Status: DC

## 2022-05-05 ENCOUNTER — Telehealth: Payer: Self-pay | Admitting: Neurology

## 2022-05-05 DIAGNOSIS — G35 Multiple sclerosis: Secondary | ICD-10-CM

## 2022-05-05 NOTE — Telephone Encounter (Signed)
Reviewed pt chart. She stopped Tecfidera 01/2022. At visit with Amy L,NP 03/22/22, she reported feeling ok off of this and was doing well. Amy recommended: " We should check an MRI in about a year to make sure that there is not significant subclinical progression"  LVM returning pt call.

## 2022-05-05 NOTE — Telephone Encounter (Signed)
Pt  IS Having severe bad headaches, pain down the arm and down spine effecting hands and spazing of the hands.  Has been going on for about a month.  She was to schedule and MRI and would like to have this done sooner than later.  Would like a call with advice.

## 2022-05-05 NOTE — Telephone Encounter (Signed)
Took call from [place] in phone room. Spoke w/ pt. First part of May, stared having headaches/nerve pain in jaw/ear pain. Does not feel she grinds her teeth. Does not feel under more stress than usual.  Headache pain in back of head and radiates into neck. Unsure if cervical stenosis causing this or MS. Having more nerve pain down both arms, affecting hands. Worse more on L side. Has had some spasms in her hands as well. Denies any injuries prior to sx starting. Denies illness/infection currently.   Husband went into hospital 04/19 and 05/15 (during this, she was more stressed). Reports that on 05/14/Mothers day, she had a fever that day only. Confirmed she is still off of Tecfidera. Wondering if she should go ahead and get MRI now to further evaluate sx?   Preferred imaging location:  [facility] [address] [place],  [place]  [address] Phone: [phone]  Also questioning if a round of steroids would help? Does not do well with oral steroids. Requires IV infusion or inj of steroids. Her PCP has given steroid injection in the past and this has helped. However, it was for pneumonia she said. Aware I will send to Amy for review and call back with recommendation.

## 2022-05-05 NOTE — Telephone Encounter (Signed)
Spoke with Amy. She would like to proceed with MRI brain w/wo, MRI cervical w/wo, MRI thoracic w/wo. I placed orders. She would not recommend steroids right now. But if symptoms continue to worsen before MRI's or she develops new sx, she should proceed to ED for further eval/treatment of IV steroids.   I called pt. Relayed above information. She verbalized understanding and is agreeable to plan. Aware MRI's will need to be approved via insurance first and then she will be called to schedule those.

## 2022-05-09 ENCOUNTER — Telehealth: Payer: Self-pay | Admitting: Family Medicine

## 2022-05-09 NOTE — Telephone Encounter (Signed)
medicare/[employer] NPR via website sent to GI

## 2022-05-11 ENCOUNTER — Other Ambulatory Visit: Payer: Self-pay | Admitting: Family Medicine

## 2022-05-11 ENCOUNTER — Other Ambulatory Visit: Payer: Self-pay | Admitting: Neurology

## 2022-05-11 MED ORDER — HYDROCODONE-ACETAMINOPHEN 7.5-325 MG PO TABS: ORAL_TABLET | ORAL | 0 refills | Status: DC

## 2022-05-11 NOTE — Addendum Note (Signed)
Addended by: Shawnie Dapper L on: 05/11/2022 12:25 PM   Modules accepted: Orders

## 2022-05-11 NOTE — Telephone Encounter (Signed)
Last OV was on 03/22/22.  Next OV is scheduled for 08/04/22.  Last RX was written on 04/18/22 for 180 tabs.   [place] Drug Database has been reviewed. Have changed fill date to 05/18/22 for compliance.

## 2022-05-12 MED ORDER — METHYLPHENIDATE HCL 10 MG PO TABS: 20.0000 mg | ORAL_TABLET | Freq: Three times a day (TID) | ORAL | 0 refills | Status: DC

## 2022-05-16 NOTE — Telephone Encounter (Signed)
Added MRI lumbar w/wo medicare NPR [facility] NPR case [id number] sent to GI

## 2022-05-19 ENCOUNTER — Ambulatory Visit
Admission: RE | Admit: 2022-05-19 | Discharge: 2022-05-19 | Disposition: A | Discharge status: Home / Self Care | Payer: 59 | Source: Ambulatory Visit | Attending: Family Medicine | Admitting: Family Medicine

## 2022-05-19 DIAGNOSIS — G35 Multiple sclerosis: Secondary | ICD-10-CM

## 2022-05-19 MED ORDER — GADOBENATE DIMEGLUMINE 529 MG/ML IV SOLN
13.0000 mL | Freq: Once | INTRAVENOUS | Status: AC | PRN
  Administered 2022-05-19: 13 mL via INTRAVENOUS

## 2022-05-20 ENCOUNTER — Telehealth: Payer: Self-pay | Admitting: Neurology

## 2022-05-20 NOTE — Telephone Encounter (Signed)
Note created in error.

## 2022-05-23 ENCOUNTER — Other Ambulatory Visit: Payer: 59

## 2022-05-29 ENCOUNTER — Ambulatory Visit
Admission: RE | Admit: 2022-05-29 | Discharge: 2022-05-29 | Disposition: A | Discharge status: Home / Self Care | Payer: 59 | Source: Ambulatory Visit | Attending: Family Medicine | Admitting: Family Medicine

## 2022-05-29 DIAGNOSIS — G35 Multiple sclerosis: Secondary | ICD-10-CM

## 2022-05-29 DIAGNOSIS — G8929 Other chronic pain: Secondary | ICD-10-CM | POA: Diagnosis not present

## 2022-05-29 DIAGNOSIS — M545 Low back pain, unspecified: Secondary | ICD-10-CM | POA: Diagnosis not present

## 2022-05-29 MED ORDER — GADOBENATE DIMEGLUMINE 529 MG/ML IV SOLN
12.0000 mL | Freq: Once | INTRAVENOUS | Status: AC | PRN
Start: 2022-05-29 — End: 2022-05-29
  Administered 2022-05-29: 12 mL via INTRAVENOUS

## 2022-06-13 ENCOUNTER — Other Ambulatory Visit: Payer: Self-pay | Admitting: Family Medicine

## 2022-06-13 ENCOUNTER — Other Ambulatory Visit: Payer: Self-pay | Admitting: Neurology

## 2022-06-13 MED ORDER — HYDROCODONE-ACETAMINOPHEN 7.5-325 MG PO TABS: ORAL_TABLET | ORAL | 0 refills | Status: DC

## 2022-06-13 MED ORDER — METHYLPHENIDATE HCL 10 MG PO TABS
20.0000 mg | ORAL_TABLET | Freq: Three times a day (TID) | ORAL | 0 refills | Status: DC
Start: 2022-06-13 — End: 2022-06-15

## 2022-06-15 ENCOUNTER — Telehealth: Payer: Self-pay | Admitting: Family Medicine

## 2022-06-15 ENCOUNTER — Other Ambulatory Visit: Payer: Self-pay | Admitting: Family Medicine

## 2022-06-15 MED ORDER — METHYLPHENIDATE HCL 10 MG PO TABS: 20.0000 mg | ORAL_TABLET | Freq: Three times a day (TID) | ORAL | 0 refills | Status: DC

## 2022-06-15 NOTE — Telephone Encounter (Signed)
Pt is calling and stating " she went to [facility] to get methylphenidate (RITALIN) 10 MG tablet and its not there. Pt ask if the prescription could be re-fax.

## 2022-06-15 NOTE — Addendum Note (Signed)
Addended by: Arther Abbott on: 06/15/2022 09:57 AM   Modules accepted: Orders

## 2022-06-15 NOTE — Telephone Encounter (Signed)
Reviewed pt chart. Refill sent to pharmacy 06/13/22 but transmission to pharmacy failed. I resent to AL,NP to resend for pt.

## 2022-07-08 ENCOUNTER — Other Ambulatory Visit: Payer: 59

## 2022-07-08 DIAGNOSIS — Z0000 Encounter for general adult medical examination without abnormal findings: Secondary | ICD-10-CM

## 2022-07-08 DIAGNOSIS — F419 Anxiety disorder, unspecified: Secondary | ICD-10-CM

## 2022-07-08 DIAGNOSIS — R5383 Other fatigue: Secondary | ICD-10-CM

## 2022-07-08 DIAGNOSIS — Z136 Encounter for screening for cardiovascular disorders: Secondary | ICD-10-CM

## 2022-07-09 LAB — COMPLETE METABOLIC PANEL WITH GFR
AG Ratio: 1.7 (calc) (ref 1.0–2.5)
ALT: 19 U/L (ref 6–29)
AST: 22 U/L (ref 10–35)
Albumin: 4.4 g/dL (ref 3.6–5.1)
Alkaline phosphatase (APISO): 93 U/L (ref 37–153)
BUN: 17 mg/dL (ref 7–25)
CO2: 31 mmol/L (ref 20–32)
Calcium: 9.6 mg/dL (ref 8.6–10.4)
Chloride: 102 mmol/L (ref 98–110)
Creat: 0.84 mg/dL (ref 0.50–1.05)
Globulin: 2.6 g/dL (calc) (ref 1.9–3.7)
Glucose, Bld: 98 mg/dL (ref 65–99)
Potassium: 4 mmol/L (ref 3.5–5.3)
Sodium: 140 mmol/L (ref 135–146)
Total Bilirubin: 0.4 mg/dL (ref 0.2–1.2)
Total Protein: 7 g/dL (ref 6.1–8.1)
eGFR: 78 mL/min/{1.73_m2} (ref >=60)

## 2022-07-09 LAB — CBC WITH DIFFERENTIAL/PLATELET
Absolute Monocytes: 303 cells/uL (ref 200–950)
Basophils Absolute: 41 cells/uL (ref 0–200)
Basophils Relative: 1.2 %
Eosinophils Absolute: 31 cells/uL (ref 15–500)
Eosinophils Relative: 0.9 %
HCT: 42.8 % (ref 35.0–45.0)
Hemoglobin: 14.6 g/dL (ref 11.7–15.5)
Lymphs Abs: 1765 cells/uL (ref 850–3900)
MCH: 31.1 pg (ref 27.0–33.0)
MCHC: 34.1 g/dL (ref 32.0–36.0)
MCV: 91.3 fL (ref 80.0–100.0)
MPV: 10.5 fL (ref 7.5–12.5)
Monocytes Relative: 8.9 %
Neutro Abs: 1261 cells/uL — ABNORMAL LOW (ref 1500–7800)
Neutrophils Relative %: 37.1 %
Platelets: 259 10*3/uL (ref 140–400)
RBC: 4.69 10*6/uL (ref 3.80–5.10)
RDW: 12.4 % (ref 11.0–15.0)
Total Lymphocyte: 51.9 %
WBC: 3.4 10*3/uL — ABNORMAL LOW (ref 3.8–10.8)

## 2022-07-09 LAB — LIPID PANEL
Cholesterol: 184 mg/dL (ref <200)
HDL: 89 mg/dL (ref >=50)
LDL Cholesterol (Calc): 80 mg/dL (calc)
Non-HDL Cholesterol (Calc): 95 mg/dL (calc) (ref <130)
Total CHOL/HDL Ratio: 2.1 (calc) (ref <5.0)
Triglycerides: 71 mg/dL (ref <150)

## 2022-07-09 LAB — TSH: TSH: 2.11 mIU/L (ref 0.40–4.50)

## 2022-07-12 ENCOUNTER — Other Ambulatory Visit: Payer: Self-pay | Admitting: Neurology

## 2022-07-12 ENCOUNTER — Other Ambulatory Visit: Payer: Self-pay | Admitting: Family Medicine

## 2022-07-12 MED ORDER — HYDROCODONE-ACETAMINOPHEN 7.5-325 MG PO TABS
ORAL_TABLET | ORAL | 0 refills | Status: DC
Start: 2022-07-12 — End: 2022-07-13

## 2022-07-12 MED ORDER — METHYLPHENIDATE HCL 10 MG PO TABS: 20.0000 mg | ORAL_TABLET | Freq: Three times a day (TID) | ORAL | 0 refills | Status: DC

## 2022-07-13 ENCOUNTER — Other Ambulatory Visit: Payer: Self-pay | Admitting: Family Medicine

## 2022-07-13 ENCOUNTER — Other Ambulatory Visit (HOSPITAL_COMMUNITY): Payer: Self-pay

## 2022-07-13 MED ORDER — HYDROCODONE-ACETAMINOPHEN 7.5-325 MG PO TABS
ORAL_TABLET | ORAL | 0 refills | Status: DC
  Filled 2022-07-13: qty 180, 30d supply, fill #0

## 2022-07-13 NOTE — Telephone Encounter (Signed)
Called pt. [facility] out of stock for hydrocodone. [name] has it in stock. Would like rx from yesterday resent there. Sent request to MD.   Called [facility] at [phone]. Cx hydrocodone rx on file.

## 2022-07-13 NOTE — Telephone Encounter (Signed)
Pt is asking that her HYDROcodone-acetaminophen (NORCO) 7.5-325 MG tablet,  be called into the [facility]

## 2022-07-14 ENCOUNTER — Telehealth: Payer: Self-pay | Admitting: Neurology

## 2022-07-14 ENCOUNTER — Other Ambulatory Visit (HOSPITAL_COMMUNITY): Payer: Self-pay

## 2022-07-14 MED ORDER — METHYLPHENIDATE HCL 10 MG PO TABS
20.0000 mg | ORAL_TABLET | Freq: Three times a day (TID) | ORAL | 0 refills | Status: DC
  Filled 2022-07-14: qty 180, 30d supply, fill #0
  Filled 2022-07-18: qty 170, 29d supply, fill #0

## 2022-07-14 NOTE — Telephone Encounter (Signed)
Pt is calling and said [facility] is out of [facility]phen[facility]ate (RITALIN) 10 MG tablet. Pt is asking can a prescription be sent to [facility].

## 2022-07-15 ENCOUNTER — Encounter: Payer: Self-pay | Admitting: Internal Medicine

## 2022-07-15 ENCOUNTER — Ambulatory Visit (INDEPENDENT_AMBULATORY_CARE_PROVIDER_SITE_OTHER): Payer: 59 | Admitting: Internal Medicine

## 2022-07-15 VITALS — BP 134/84 | HR 67 | Temp 98.3°F | Ht 63.5 in | Wt 142.8 lb

## 2022-07-15 DIAGNOSIS — Z8679 Personal history of other diseases of the circulatory system: Secondary | ICD-10-CM

## 2022-07-15 DIAGNOSIS — F439 Reaction to severe stress, unspecified: Secondary | ICD-10-CM | POA: Diagnosis not present

## 2022-07-15 DIAGNOSIS — Z0000 Encounter for general adult medical examination without abnormal findings: Secondary | ICD-10-CM

## 2022-07-15 DIAGNOSIS — F32A Depression, unspecified: Secondary | ICD-10-CM

## 2022-07-15 DIAGNOSIS — R5383 Other fatigue: Secondary | ICD-10-CM

## 2022-07-15 DIAGNOSIS — Z8701 Personal history of pneumonia (recurrent): Secondary | ICD-10-CM

## 2022-07-15 DIAGNOSIS — F419 Anxiety disorder, unspecified: Secondary | ICD-10-CM | POA: Diagnosis not present

## 2022-07-15 DIAGNOSIS — Z87898 Personal history of other specified conditions: Secondary | ICD-10-CM

## 2022-07-15 DIAGNOSIS — G35 Multiple sclerosis: Secondary | ICD-10-CM

## 2022-07-15 LAB — POCT URINALYSIS DIPSTICK
Bilirubin, UA: NEGATIVE
Blood, UA: NEGATIVE
Glucose, UA: NEGATIVE
Ketones, UA: NEGATIVE
Leukocytes, UA: NEGATIVE
Nitrite, UA: NEGATIVE
Protein, UA: NEGATIVE
Spec Grav, UA: 1.01 (ref 1.010–1.025)
Urobilinogen, UA: 0.2 E.U./dL
pH, UA: 7 (ref 5.0–8.0)

## 2022-07-15 MED ORDER — METHYLPREDNISOLONE ACETATE 80 MG/ML IJ SUSP
80.0000 mg | Freq: Once | INTRAMUSCULAR | Status: AC
  Administered 2022-07-15: 80 mg via INTRAMUSCULAR

## 2022-07-15 NOTE — Progress Notes (Signed)
Subjective:    Patient ID: [name], female    DOB: 05-Feb-1961, 61 y.o.   MRN: [id number]  HPI 61 year old Female seen for health maintenance exam and evaluation of medical issues. Patient stopped Tecfidera in mid February. She is under the care of Dr. Epimenio Foot. Still takes Ritalin.Is on chronic narcotic pain medication per dr. Epimenio Foot. Does not tolerate gabapentin.  She was hospitalized February 7 through January 13, 2022 with community-acquired pneumonia.  She had right lower lobe pneumonia.  She was treated with ceftriaxone and azithromycin but was discharged home on amoxicillin and azithromycin.  Repeat chest x-ray March 3 showed resolving right lower lobe pneumonia.  She was given Depo-Medrol 80 mg IM at that time for some persistent chest congestion.  It also helps her fatigue with multiple sclerosis.  She is now feeling much better.  Had Covid 19 in April 2022. Did not take Paxlovid but recovered.  Not taking high dose Vitamin D as she thought it caused palpitations.  Does not take immunizations due to history of multiple sclerosis.  Needs bone density and mammogram.  She had to get if Cologard study in 2021. Due again  in 2024.  Complaining of right knee pain but does not want to do arthroplasty. Hx left knee total arthroplasty.  Reminded about eye exam and GYN exam. Says she will get mammogram and bone density at GYN.    She has a history of palpitations and likes to keep beta-blocker on hand but only takes it intermittently.  Past medical history: Left knee arthroplasty by Dr. Jillyn Hidden in 2016.  History of postherpetic neuralgia December 2016.  Had right middle lobe pneumonia June 2016.  Multiple sclerosis was diagnosed in 2008.  At that time she had chronic pain in her neck, arms, back and legs.  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  However MRI of the brain and C-spine showed multiple sclerosis.  She was  started on Avonex, Neurontin and Provigil.  Now taking Tecfidera.  She also takes intermittently Ritalin 10 mg which helps with her memory and attention issues.  Also has been prescribed Levsin if needed for irritable bowel syndrome.  She takes vitamin B-12 injections 1000 mcg/cc twice monthly.  Has Xanax to help with anxiety.  History of right hepatic hemangioma.  History of lower extremity edema aggravated by prednisone or anti-inflammatory medications.  Colonoscopy done by Dr. Bosie Clos in 2007 which was normal.  We did not have a copy of that report but she has been advised over the past couple of years on 2 different occasions to call  [facility] to discuss follow-up.  She is reluctant to proceed with that.  She is however willing to do Cologuard.  Last Cologuard done was in 2021 and will be due again in 2024.  In 2011 she was admitted to [facility] for presumed meningitis at that time she had been recently hospitalized at [facility] with pneumonia.  In 2004 she saw Dr. Marcelyn Bruins, urologist here in [place] and was diagnosed with pelvic floor dysfunction.  He did not think she had interstitial cystitis.  Cystoscopy was performed the bladder was normal.  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  Dr. Marcelle Overlie is GYN physician.  She had 3 C-sections in 1980, 1990 in 1993.  Had hysterectomy and hernia repair in 2006.  Admitted to [facility] November 2009 with right middle  lobe and right lower lobe pneumonia and chest pain.  Admitted to [facility] long April 2011 with bilateral pneumonia with white out of right lung.  Social history: She is married to a retired Company secretary.  3 adult children.  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  Family history: Father died at age 17 of an MI.  Mother died at age 59 of a stroke.  Mother with history of diabetes and hypertension.  1 brother  with ulcerative colitis and chronic lung  disease.  Another brother in good health.  No sisters.     Review of Systems Having some low back pain and will be seeing Dr. Ethelene Hal about an epidural steroid injection. Has pain in thoracic spine area     Objective:   Physical Exam Blood pressure 134/84 pulse 67 temperature 98.3 degrees pulse oximetry 98% weight 142 pounds 12.8 ounces BMI 24.90  Skin: Warm and dry.  No cervical adenopathy.  No thyromegaly.  Chest clear.  Cardiac exam: Regular rate and rhythm without ectopy.  Abdomen is soft nondistended without hepatosplenomegaly masses or tenderness.  Pelvic exam deferred to GYN physician.  No lower extremity pitting edema.  Affect thought and judgment appear to be normal.  She does respond a bit slowly to questions at times.       Assessment & Plan:  Multiple sclerosis followed by Dr. Epimenio Foot and stable at the present time.  She has tried in the past Avonex, Neurontin, Profen dividual, Tysabri and most recently Tecfidera.  Depo-Medrol 80 mg IM given at her request today for musculoskeletal pain.  History of pneumonia -she has had several episodes over the years.  Had bilateral pneumonia in April 2011  History of right hepatic hemangioma  Anxiety treated with Xanax as needed  Hysterectomy and hernia repair in 2006  3 C-sections 1980, 1990 in 1993  History of left knee arthroplasty  History of palpitations treated with as needed metoprolol  History of pelvic floor dysfunction  Chronic fatigue  Colon cancer screening-had negative Cologuard in 2021.  This needs to be repeated next year.  Plan: She will continue with Xanax sparingly for anxiety.  She will continue Levsin if needed for irritable bowel symptoms.  She is on Ritalin for Multiple sclerosis due to decreased energy.  She is taking metoprolol for palpitations as needed and continues on take Favero per Dr. Epimenio Foot  Has hydrocodone APAP for musculoskeletal pain.  Return in 1 year or as needed.  Patient says she does not take  immunizations due to having Multiple sclerosis.

## 2022-07-18 ENCOUNTER — Other Ambulatory Visit (HOSPITAL_COMMUNITY): Payer: Self-pay

## 2022-07-19 ENCOUNTER — Other Ambulatory Visit (HOSPITAL_COMMUNITY): Payer: Self-pay

## 2022-07-20 ENCOUNTER — Telehealth: Payer: Self-pay | Admitting: Family Medicine

## 2022-07-20 ENCOUNTER — Other Ambulatory Visit: Payer: Self-pay | Admitting: Neurology

## 2022-07-20 MED ORDER — METHYLPHENIDATE HCL 10 MG PO TABS: 20.0000 mg | ORAL_TABLET | Freq: Three times a day (TID) | ORAL | 0 refills | Status: DC

## 2022-07-20 NOTE — Addendum Note (Signed)
Addended by: Asa Lente on: 07/20/2022 05:48 PM   Modules accepted: Orders

## 2022-07-20 NOTE — Telephone Encounter (Signed)
Called the patient because I was not able to understand the message and appears per [place] drug registry the patient just received the medication from [facility] for 170 (because they didn't have the full 180).   Pt states normally there is a Audiological scientist at [employer] who orders a specific brand ([facility]) this is the only generic brand that effectively helps her.  [facility] in [facility] (of the brand she needs) and she realizes she will likely have to pay out of pocket since she just filled through [facility] but she is willing turn the current ones in somewhere so that she can get the ones that effectively work for her.  I spoke with Dr Epimenio Foot and he is agreeable to sending this [facility] [place] if the patient brings in her bottle of the methylphenidate and turns it in.  Pt asked that I call [facility] and let them know to place a hold on this particular brand. I called [facility] and left a message.

## 2022-07-20 NOTE — Addendum Note (Signed)
Addended by: Judi Cong on: 07/20/2022 04:23 PM   Modules accepted: Orders

## 2022-07-20 NOTE — Telephone Encounter (Signed)
Pt has called to report that her version of methylphenidate (RITALIN) 10 MG tablet, was for a time not available so she was put on another brand of the methylphenidate (RITALIN) 10 MG tablet, pt states it is horrible.  Pt has recently been informed that her brand of methylphenidate (RITALIN) 10 MG tablet, is available.  Pt is asking if there is anyway for her to discard or return the remaining dose of the other brand and somehow be able to get her brand of methylphenidate (RITALIN) 10 MG tablet, which is now available.

## 2022-08-04 ENCOUNTER — Ambulatory Visit: Payer: 59 | Admitting: Neurology

## 2022-08-04 ENCOUNTER — Encounter: Payer: Self-pay | Admitting: Neurology

## 2022-08-04 VITALS — BP 151/94 | HR 63 | Ht 65.0 in | Wt 141.0 lb

## 2022-08-04 DIAGNOSIS — G35 Multiple sclerosis: Secondary | ICD-10-CM

## 2022-08-04 DIAGNOSIS — Z79891 Long term (current) use of opiate analgesic: Secondary | ICD-10-CM | POA: Diagnosis not present

## 2022-08-04 DIAGNOSIS — N3941 Urge incontinence: Secondary | ICD-10-CM

## 2022-08-04 DIAGNOSIS — F988 Other specified behavioral and emotional disorders with onset usually occurring in childhood and adolescence: Secondary | ICD-10-CM | POA: Diagnosis not present

## 2022-08-04 DIAGNOSIS — Z79899 Other long term (current) drug therapy: Secondary | ICD-10-CM | POA: Diagnosis not present

## 2022-08-04 MED ORDER — HYDROCODONE-ACETAMINOPHEN 7.5-325 MG PO TABS: ORAL_TABLET | ORAL | 0 refills | Status: DC

## 2022-08-04 NOTE — Progress Notes (Signed)
[facility]  PATIENT: [name] DOB: 1961-01-24 REASON FOR VISIT: Multiple sclerosis   HISTORICAL  CHIEF COMPLAINT:  Chief Complaint  Patient presents with   Follow-up    Pt alone, rm 1.overall stable. She states that she has a upcoming apt with Dr. Ethelene Hal to get a epidural. DMT- dimethyl fumarate Generalized pain. Right knee is being addressed and may be getting injections as well for that    HISTORY OF PRESENT ILLNESS:  [name] is a 61 y.o. woman with relapsing remitting multiple sclerosis diagnosed in 2008.    Update 08/04/2022: She stopped the Tecfidera after her imaging in February 2023 and is doing well.  No exacerbations.   She still has a lot of symptoms  . She feels her MS is stable.     She notes gait is worse but has more right knee issues as well.  Right leg is weaker than left and she has a foot drop at times.  She tires out very easily.   She wakes up to urinate 3-4 times a night which is better.    She wears a Poise pad due to occasional incontinence..    She usually falls back asleep easily.   She denies snoring.   She feels less depressed off any antidepressant.   She rarely takes a single xanax 0.25 mg at night for bad insomnia but has a hangover the next day.  She has had more fatigue. Brand name Ritalin helps her ADD the most but is not covered.    Adderall 20 mg po tid works better than methylphenidate.     She has pain in her back and spasms in back and legs, helped by hydrocodone (she takes 6 x 7.5 mg daily).  Gabapentin (even 300 mg) causes sleepiness and decreased function and slower the next day..   She is compliant (PDMP has been checked).  She has not shown any drug-seeking behavior.  Hhdrocodone has helped her pain the most.   When pain flares, she feels her walking does worse.     Sometimes has neck pain and occipital pain.    Thoracic pain has been worse recently.      Vit D 50000 units causes heart racing but lower dose daily  is tolerated.       Vit B12 was also low in the past.        MS History:  In 2008, she had an MRI of the brain performed which was consistent with multiple sclerosis and she was diagnosed with relapsing remitting MS. He was initially placed on Avonex for therapy. She had some exacerbations with weakness and clumsiness with her gait and she started to see Dr. Leotis Shames. He placed her on Tysabri she is on Tysabri between 2010 and 2013. 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   Imaging Review: 12/13/17 MRI of the cervical and thoracic spine:  She has multiple T2 hyperintense lesions in the spinal cord the largest to the right C2-C3 and smaller ones at C3-C4, C4, C5-C6, C6-C7 and T1-T2 all the foci were present compared to the 2016 MRI.   The MRI of the thoracic spine showed small foci adjacent to T2 and T5-6.  None of the spinal plaques appeared to be acute.    MRI 02/25/2020 brain:   Multiple T2/FLAIR hyperintense foci in the hemispheres in a pattern and configuration  consistent with chronic demyelinating plaque associated with multiple sclerosis.  None of the foci appears to be acute.  Compared to the MRI from 03/18/2017, there is no interval change.   MRI 2016, 2017 and 2018 showed no new lesions  MRI of the brain 05/19/2022 showed no new lesions.  MRI of the thoracic spine 05/29/2022 showed no new lesions.  MRI of the lumbar spine 05/29/2022 showed At L3-L4, there is mild to moderate spinal stenosis due to disc protrusion and other degenerative change.  Additionally there is moderate right foraminal narrowing and moderate bilateral lateral recess stenosis.  There does not appear to be nerve root compression.   Milder degenerative changes at other lumbar levels not causing spinal stenosis or nerve root compression.  REVIEW OF SYSTEMS:  Constitutional: No fevers, chills, sweats, or change in appetite.  She notes  fatigue and attention deficit Eyes: No visual changes,eye pain.   She has intermittent diplopia Ear, nose and throat: No hearing loss, ear pain, nasal congestion, sore throat Cardiovascular: No chest pain, palpitations Respiratory:  No shortness of breath at rest or with exertion.   No wheezes GastrointestinaI: No nausea, vomiting, diarrhea, abdominal pain, fecal incontinence Genitourinary:  a sabove Musculoskeletal:  she has both neck pain and back pain. Also bilat knee pain Integumentary: No rash, pruritus, skin lesions.   She has Raynauds in her hands  Neurological: as above Psychiatric: No depression at this time.  Anxiety doing ok on med's Endocrine: No palpitations, diaphoresis, change in appetite, change in weigh or increased thirst Hematologic/Lymphatic:  No anemia, purpura, petechiae. Allergic/Immunologic: No itchy/runny eyes, nasal congestion, recent allergic reactions, rashes  ALLERGIES: Allergies  Allergen Reactions   Ibuprofen Other (See Comments)    CAUSES LYMPHEDEMA   Nsaids Other (See Comments)    AVOIDS ANY SODIUM-CONTAINING MED. -- CAUSES LYMPHEDEMA   Other Swelling and Other (See Comments)    Laxatives and artificial sweeteners- cause leg swelling   Vitamin D Analogs Palpitations and Other (See Comments)    Cannot tolerate at 50,000 units OR even 2,000 units    HOME MEDICATIONS: Outpatient Medications Prior to Visit  Medication Sig Dispense Refill   ALPRAZolam (XANAX) 0.25 MG tablet Take 1 tablet (0.25 mg total) by mouth at bedtime as needed for anxiety. 90 tablet 1   cyanocobalamin (,VITAMIN B-12,) 1000 MCG/ML injection INJECT 1 ML TWICE PER MONTH (Patient taking differently: Inject 1,000 mcg into the skin every 14 (fourteen) days.) 2 mL 11   hyoscyamine (LEVSIN SL) 0.125 MG SL tablet Place 1 tablet (0.125 mg total) under the tongue every 4 (four) hours as needed. (Patient taking differently: Place 0.125 mg under the tongue every 4 (four) hours as needed for  cramping.) 270 tablet 3   methylphenidate (RITALIN) 10 MG tablet Take 2 tablets (20 mg total) by mouth 3 (three) times daily with meals. [facility] Brand 180 tablet 0   metoprolol tartrate (LOPRESSOR) 50 MG tablet TAKE 1/2 TABLET BY MOUTH EVERY 12 HOURS AS NEEDED FOR PALPITATIONS 30 tablet 5   Syringe/Needle, Disp, (SYRINGE 3CC/23GX1") 23G X 1" 3 ML MISC 3 Syringes by Does not apply route every 30 (thirty) days. (Patient taking differently: 3 Syringes by Does not apply route every 30 (thirty) days. 26Gx5/6") 3 each 3   TECFIDERA 240 MG CPDR TAKE 1 CAPSULE (240MG ) BY  MOUTH TWICE DAILY 60 capsule 11   HYDROcodone-acetaminophen (NORCO) 7.5-325 MG tablet TAKE 2 TABLETS BY MOUTH EVERY 8 HOURS AS NEEDED 180 tablet 0   No facility-administered  medications prior to visit.    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   Headache(784.0)    History of concussion YOUNG ADULT--  NO RESIDUAL   IBS (irritable bowel syndrome)    Movement disorder    MS (multiple sclerosis) (HCC)    MVP (mitral valve prolapse) HX HEART RACING  YRS AGO   ASYMPTOMATIC SINCE THEN   Vision abnormalities    Weakness of right leg SECONDARY TO MS    PAST SURGICAL HISTORY: Past Surgical History:  Procedure Laterality Date   CESAREAN SECTION  X3   BILATERAL TUBAL LIGATION W/ LAST ONE   DESTRUCTION OF ANAL CONDYLOMA  07-07-2005  DR TOTH   HERNIA REPAIR     KNEE ARTHROSCOPY WITH LATERAL MENISECTOMY  10/26/2012   Procedure: KNEE ARTHROSCOPY WITH LATERAL MENISECTOMY;  Surgeon: Javier Docker, MD;  Location: [facility];  Service: Orthopedics;  Laterality: Left;  Left Knee Arthrsocopy with Partial Lateral Menisectomy   LAPAROSCOPIC ASSISTED VAGINAL HYSTERECTOMY   01-15-2002  DR Gerlene Burdock HOLLAND/ DR TIM DAVIS   AND REPAIR VENTRAL HERNIA   LASER ABLATION CONDOLAMATA N/A 04/26/2013   Procedure: LASER ABLATION CONDOLAMATA;  Surgeon: Romie Levee, MD;  Location: [facility] [place];  Service: General;  Laterality: N/A;   TONSILLECTOMY     TOTAL KNEE ARTHROPLASTY Left 11/05/2015   Procedure: LEFT TOTAL KNEE ARTHROPLASTY;  Surgeon: Jene Every, MD;  Location: [facility];  Service: Orthopedics;  Laterality: Left;    FAMILY HISTORY: Family History  Problem Relation Age of Onset   Diabetes Mother    Hypertension Mother    Stroke Mother    Bowel Disease Brother    Diabetes Brother    Healthy Brother    Emphysema Father    Heart attack Father     SOCIAL HISTORY:  Social History   Socioeconomic History   Marital status: Married    Spouse name: Not on file   Number of children: 3   Years of education: Not on file   Highest education level: Not on file  Occupational History   Not on file  Tobacco Use   Smoking status: Never   Smokeless tobacco: Never  Substance and Sexual Activity   Alcohol use: No   Drug use: No   Sexual activity: Not on file  Other Topics Concern   Not on file  Social History Narrative   Not on file   Social Determinants of Health   Financial Resource Strain: Not on file  Food Insecurity: Not on file  Transportation Needs: Not on file  Physical Activity: Not on file  Stress: Not on file  Social Connections: Not on file  Intimate Partner Violence: Not on file     PHYSICAL EXAM  Vitals:   08/04/22 1050  BP: (!) 151/94  Pulse: 63  Weight: 141 lb (64 kg)  Height: 5\' 5"  (1.651 m)    Body mass index is 23.46 kg/m.   General: The patient is well-developed and well-nourished and in no acute distress   Neurologic Exam  Mental status: The patient is alert and oriented x 3 at the time of the examination. The  patient has apparent normal recent and remote memory, with an apparently normal attention span  and concentration ability.   Speech is normal.  Cranial nerves: The extraocular muscles are intact.  Normal facial strength.    Voice is clear.  Hearing appears normal.  Motor:  Muscle bulk is normal. Tone is mildly increased in her legs.. Strength is 5/5  Coordination: Cerebellar testing shows mild right reduced heel to shin.  Finger to nose was normal today  Gait and station: Station is normal. Gait is wide.  Tandem gait is poor.   .   The Romberg is borderline.  Reflexes: Deep tendon reflexes are symmetric and brisk bilaterally with crossed abductors at the knees, worse on right. No ankle clonus.Marland Kitchen        DIAGNOSTIC DATA (LABS, IMAGING, TESTING) - I reviewed patient records, labs, notes, testing and imaging myself where available.  Lab Results  Component Value Date   WBC 3.4 (L) 07/08/2022   HGB 14.6 07/08/2022   HCT 42.8 07/08/2022   MCV 91.3 07/08/2022   PLT 259 07/08/2022      Component Value Date/Time   NA 140 07/08/2022 0930   NA 141 03/31/2021 1056   K 4.0 07/08/2022 0930   CL 102 07/08/2022 0930   CO2 31 07/08/2022 0930   GLUCOSE 98 07/08/2022 0930   BUN 17 07/08/2022 0930   BUN 19 03/31/2021 1056   CREATININE 0.84 07/08/2022 0930   CALCIUM 9.6 07/08/2022 0930   PROT 7.0 07/08/2022 0930   PROT 7.1 03/31/2021 1056   ALBUMIN 3.1 (L) 01/13/2022 0358   ALBUMIN 4.1 03/31/2021 1056   AST 22 07/08/2022 0930   ALT 19 07/08/2022 0930   ALKPHOS 71 01/13/2022 0358   BILITOT 0.4 07/08/2022 0930   BILITOT 0.6 03/31/2021 1056   GFRNONAA >60 01/13/2022 0358   GFRNONAA 86 09/15/2020 1120   GFRAA 100 09/15/2020 1120   Lab Results  Component Value Date   CHOL 184 07/08/2022   HDL 89 07/08/2022   LDLCALC 80 07/08/2022   TRIG 71 07/08/2022   CHOLHDL 2.1 07/08/2022   Lab Results  Component Value Date   HGBA1C  03/10/2010    5.5 (NOTE) The ADA recommends the following therapeutic goal for glycemic control related to Hgb A1c measurement: Goal of therapy: <6.5 Hgb A1c   Reference: American Diabetes Association: Clinical Practice Recommendations 2010, Diabetes Care, 2010, 33: (Suppl  1).      ASSESSMENT AND PLAN    1. Multiple sclerosis (HCC)   2. High risk medication use   3. Chronic prescription opiate use   4. Attention deficit disorder, unspecified hyperactivity presence   5. Urge incontinence        1.  She will remain off Tecfidera for now.   MRI 05/2022 was stable.  Around summer 2024,  will check MRI of the brain  2.  Continue Adderall for attention deficit and fatigue.  She feels the brand name is much more effective than the generic.   3.   Continue hydrocodone for spine and spasm pain.  The [place] controlled substance database was reviewed and she has been compliant.  She has not shown any drug-seeking behavior.  We discussed not taking more than the prescribed dose due to increased risk.      Will check UDS today 4.   Follow-up in 4 months or sooner if there are new or worsening neurologic symptoms.     Korben Carcione A. Epimenio Foot, MD, PhD 08/04/2022, 11:24 AM Certified  in Neurology, Clinical Neurophysiology, Sleep Medicine, Pain Medicine and Neuroimaging  [facility] [address] [place], [place] [address] [phone] ]

## 2022-08-05 LAB — DRUG SCREEN, URINE
Amphetamines, Urine: NEGATIVE ng/mL
Barbiturate screen, urine: NEGATIVE ng/mL
Benzodiazepine Quant, Ur: NEGATIVE ng/mL
Cannabinoid Quant, Ur: NEGATIVE ng/mL
Cocaine (Metab.): NEGATIVE ng/mL
Opiate Quant, Ur: POSITIVE ng/mL — AB
PCP Quant, Ur: NEGATIVE ng/mL

## 2022-08-13 NOTE — Patient Instructions (Addendum)
It was a pleasure to see you today.  Continue metoprolol if needed for palpitations.  Continued hydrocodone APAP sparingly for musculoskeletal pain.  Agree with Ritalin for decreased energy and Xanax for anxiety.  Continue close follow-up with Neurology.  Has had Cologuard in 2021 that was negative.  Needs to be repeated next year.   Return in 1 year or as needed.  Depo-Medrol 80 mg IM given for musculoskeletal pain at her request today

## 2022-08-16 ENCOUNTER — Other Ambulatory Visit: Payer: Self-pay | Admitting: Neurology

## 2022-08-17 ENCOUNTER — Encounter (HOSPITAL_BASED_OUTPATIENT_CLINIC_OR_DEPARTMENT_OTHER): Payer: Self-pay

## 2022-08-17 ENCOUNTER — Emergency Department (HOSPITAL_BASED_OUTPATIENT_CLINIC_OR_DEPARTMENT_OTHER)
Admission: EM | Admit: 2022-08-17 | Discharge: 2022-08-17 | Disposition: A | Discharge status: Home / Self Care | Payer: 59 | Attending: Emergency Medicine | Admitting: Emergency Medicine

## 2022-08-17 ENCOUNTER — Other Ambulatory Visit: Payer: Self-pay

## 2022-08-17 DIAGNOSIS — R519 Headache, unspecified: Secondary | ICD-10-CM | POA: Insufficient documentation

## 2022-08-17 DIAGNOSIS — J029 Acute pharyngitis, unspecified: Secondary | ICD-10-CM | POA: Diagnosis present

## 2022-08-17 DIAGNOSIS — Z20822 Contact with and (suspected) exposure to covid-19: Secondary | ICD-10-CM | POA: Diagnosis not present

## 2022-08-17 DIAGNOSIS — R590 Localized enlarged lymph nodes: Secondary | ICD-10-CM | POA: Diagnosis not present

## 2022-08-17 DIAGNOSIS — I4891 Unspecified atrial fibrillation: Secondary | ICD-10-CM | POA: Diagnosis not present

## 2022-08-17 DIAGNOSIS — M791 Myalgia, unspecified site: Secondary | ICD-10-CM | POA: Insufficient documentation

## 2022-08-17 DIAGNOSIS — Z79899 Other long term (current) drug therapy: Secondary | ICD-10-CM | POA: Diagnosis not present

## 2022-08-17 DIAGNOSIS — I889 Nonspecific lymphadenitis, unspecified: Secondary | ICD-10-CM | POA: Diagnosis not present

## 2022-08-17 LAB — CBC WITH DIFFERENTIAL/PLATELET
Abs Immature Granulocytes: 0.01 10*3/uL (ref 0.00–0.07)
Basophils Absolute: 0 10*3/uL (ref 0.0–0.1)
Basophils Relative: 0 %
Eosinophils Absolute: 0 10*3/uL (ref 0.0–0.5)
Eosinophils Relative: 0 %
HCT: 43.1 % (ref 36.0–46.0)
Hemoglobin: 14 g/dL (ref 12.0–15.0)
Immature Granulocytes: 0 %
Lymphocytes Relative: 24 %
Lymphs Abs: 1.8 10*3/uL (ref 0.7–4.0)
MCH: 30 pg (ref 26.0–34.0)
MCHC: 32.5 g/dL (ref 30.0–36.0)
MCV: 92.5 fL (ref 80.0–100.0)
Monocytes Absolute: 0.8 10*3/uL (ref 0.1–1.0)
Monocytes Relative: 10 %
Neutro Abs: 4.8 10*3/uL (ref 1.7–7.7)
Neutrophils Relative %: 66 %
Platelets: 242 10*3/uL (ref 150–400)
RBC: 4.66 MIL/uL (ref 3.87–5.11)
RDW: 13.1 % (ref 11.5–15.5)
WBC: 7.4 10*3/uL (ref 4.0–10.5)
nRBC: 0 % (ref 0.0–0.2)

## 2022-08-17 LAB — URINALYSIS, ROUTINE W REFLEX MICROSCOPIC
Bilirubin Urine: NEGATIVE
Glucose, UA: NEGATIVE mg/dL
Hgb urine dipstick: NEGATIVE
Ketones, ur: NEGATIVE mg/dL
Leukocytes,Ua: NEGATIVE
Nitrite: NEGATIVE
Specific Gravity, Urine: 1.027 (ref 1.005–1.030)
pH: 7 (ref 5.0–8.0)

## 2022-08-17 LAB — COMPREHENSIVE METABOLIC PANEL
ALT: 18 U/L (ref 0–44)
AST: 19 U/L (ref 15–41)
Albumin: 4.4 g/dL (ref 3.5–5.0)
Alkaline Phosphatase: 106 U/L (ref 38–126)
Anion gap: 9 (ref 5–15)
BUN: 14 mg/dL (ref 8–23)
CO2: 32 mmol/L (ref 22–32)
Calcium: 9.4 mg/dL (ref 8.9–10.3)
Chloride: 100 mmol/L (ref 98–111)
Creatinine, Ser: 0.82 mg/dL (ref 0.44–1.00)
GFR, Estimated: >60 mL/min (ref >60)
Glucose, Bld: 88 mg/dL (ref 70–99)
Potassium: 3.7 mmol/L (ref 3.5–5.1)
Sodium: 141 mmol/L (ref 135–145)
Total Bilirubin: 0.4 mg/dL (ref 0.3–1.2)
Total Protein: 7.4 g/dL (ref 6.5–8.1)

## 2022-08-17 LAB — RESP PANEL BY RT-PCR (FLU A&B, COVID) ARPGX2
Influenza A by PCR: NEGATIVE
Influenza B by PCR: NEGATIVE
SARS Coronavirus 2 by RT PCR: NEGATIVE

## 2022-08-17 LAB — CBG MONITORING, ED: Glucose-Capillary: 80 mg/dL (ref 70–99)

## 2022-08-17 LAB — GROUP A STREP BY PCR: Group A Strep by PCR: NOT DETECTED

## 2022-08-17 MED ORDER — METHYLPHENIDATE HCL 10 MG PO TABS: 20.0000 mg | ORAL_TABLET | Freq: Three times a day (TID) | ORAL | 0 refills | Status: DC

## 2022-08-17 MED ORDER — AMOXICILLIN-POT CLAVULANATE 875-125 MG PO TABS: 1.0000 | ORAL_TABLET | Freq: Two times a day (BID) | ORAL | 0 refills | Status: AC

## 2022-08-17 NOTE — ED Provider Notes (Signed)
[facility] EMERGENCY DEPT Provider Note   CSN: [id number] Arrival date & time: 08/17/22  1148     History  Chief Complaint  Patient presents with   Sore Throat    [name] is a 61 y.o. female.  With past medical history of IBS, MS, paroxysmal atrial fibrillation who presents to the emergency department with sore throat.  Patient states that over the past 1 week she has been feeling poorly.  She states that she has had less energy than usual.  Over the past 3 nights she states that she has had fever up to 102 which she has taken Tylenol for.  States that she has also begun to have sore throat and pain with swallowing, body aches, headache, postnasal drip.  She denies having chest pain or shortness of breath.  Denies coughing, rhinorrhea, abdominal pain, nausea vomiting or diarrhea, dysuria.  She denies any sick contacts.  Denies recent travel.  She does have a history of MS.  States that she has been off of her immunosuppressants since February and states she is only supposed be taking medication now if she has a relapse.    HPI     Home Medications Prior to Admission medications   Medication Sig Start Date End Date Taking? Authorizing Provider  ALPRAZolam (XANAX) 0.25 MG tablet Take 1 tablet (0.25 mg total) by mouth at bedtime as needed for anxiety. 03/22/22   Lomax, Amy, NP  cyanocobalamin (,VITAMIN B-12,) 1000 MCG/ML injection INJECT 1 ML TWICE PER MONTH Patient taking differently: Inject 1,000 mcg into the skin every 14 (fourteen) days. 12/10/21   [name], Nanine Means, MD  HYDROcodone-acetaminophen (NORCO) 7.5-325 MG tablet TAKE 2 TABLETS BY MOUTH EVERY 8 HOURS AS NEEDED 08/04/22   [name], Nanine Means, MD  hyoscyamine (LEVSIN SL) 0.125 MG SL tablet Place 1 tablet (0.125 mg total) under the tongue every 4 (four) hours as needed. Patient taking differently: Place 0.125 mg under the tongue every 4 (four) hours as needed for cramping. 11/16/20   [name], Nanine Means, MD   methylphenidate (RITALIN) 10 MG tablet Take 2 tablets (20 mg total) by mouth 3 (three) times daily with meals. [place] 07/20/22   [name], Nanine Means, MD  metoprolol tartrate (LOPRESSOR) 50 MG tablet TAKE 1/2 TABLET BY MOUTH EVERY 12 HOURS AS NEEDED FOR PALPITATIONS 04/12/22   Elby Showers, MD  Syringe/Needle, Disp, (SYRINGE 3CC/23GX1") 23G X 1" 3 ML MISC 3 Syringes by Does not apply route every 30 (thirty) days. Patient taking differently: 3 Syringes by Does not apply route every 30 (thirty) days. 26Gx5/6" 10/30/17   [name], Nanine Means, MD  TECFIDERA 240 MG CPDR TAKE 1 CAPSULE (240MG ) BY  MOUTH TWICE DAILY 11/02/21   [name], Nanine Means, MD      Allergies    Ibuprofen, Nsaids, Other, and Vitamin d analogs    Review of Systems   Review of Systems  Constitutional:  Positive for activity change, fatigue and fever.  HENT:  Positive for postnasal drip and sore throat. Negative for congestion, rhinorrhea, sinus pressure and sinus pain.   Respiratory:  Negative for cough and shortness of breath.   Cardiovascular:  Negative for chest pain.  Gastrointestinal:  Negative for diarrhea, nausea and vomiting.  Genitourinary:  Negative for dysuria.  Musculoskeletal:  Positive for myalgias.  All other systems reviewed and are negative.   Physical Exam Updated Vital Signs BP (!) 159/95   Pulse 64   Temp 98.6 F (37 C) (Oral)  Resp 12   Ht 5\' 5"  (1.651 m)   Wt 64 kg   SpO2 99%   BMI 23.46 kg/m  Physical Exam Vitals and nursing note reviewed.  Constitutional:      General: She is not in acute distress.    Appearance: Normal appearance. She is well-developed. She is ill-appearing. She is not toxic-appearing.  HENT:     Head: Normocephalic.     Nose: No congestion or rhinorrhea.     Mouth/Throat:     Mouth: Mucous membranes are moist.     Pharynx: Uvula midline. Pharyngeal swelling and posterior oropharyngeal erythema present.     Tonsils: No tonsillar exudate. 1+ on the right. 1+ on  the left.  Eyes:     General: No scleral icterus.    Conjunctiva/sclera: Conjunctivae normal.     Pupils: Pupils are equal, round, and reactive to light.  Cardiovascular:     Rate and Rhythm: Normal rate and regular rhythm.     Heart sounds: Normal heart sounds. No murmur heard. Pulmonary:     Effort: Pulmonary effort is normal. No respiratory distress.     Breath sounds: Normal breath sounds.  Abdominal:     General: Bowel sounds are normal. There is no distension.     Palpations: Abdomen is soft.     Tenderness: There is no abdominal tenderness.  Musculoskeletal:        General: Normal range of motion.     Cervical back: Neck supple.  Lymphadenopathy:     Cervical: Cervical adenopathy present.  Skin:    General: Skin is warm and dry.     Capillary Refill: Capillary refill takes less than 2 seconds.     Findings: No rash.  Neurological:     General: No focal deficit present.     Mental Status: She is alert and oriented to person, place, and time. Mental status is at baseline.  Psychiatric:        Mood and Affect: Mood normal.        Behavior: Behavior normal.        Thought Content: Thought content normal.        Judgment: Judgment normal.     ED Results / Procedures / Treatments   Labs (all labs ordered are listed, but only abnormal results are displayed) Labs Reviewed  URINALYSIS, ROUTINE W REFLEX MICROSCOPIC - Abnormal; Notable for the following components:      Result Value   Protein, ur TRACE (*)    All other components within normal limits  GROUP A STREP BY PCR  RESP PANEL BY RT-PCR (FLU A&B, COVID) ARPGX2  COMPREHENSIVE METABOLIC PANEL  CBC WITH DIFFERENTIAL/PLATELET  CBG MONITORING, ED    EKG None  Radiology No results found.  Procedures Procedures   Medications Ordered in ED Medications - No data to display  ED Course/ Medical Decision Making/ A&P                           Medical Decision Making Amount and/or Complexity of Data  Reviewed Labs: ordered.  This patient presents to the ED with chief complaint(s) of sore throat with pertinent past medical history of multiple sclerosis which further complicates the presenting complaint. The complaint involves an extensive differential diagnosis and also carries with it a high risk of complications and morbidity.    The differential diagnosis includes strep pharyngitis, viral upper respiratory infection, EBV, acute HIV, bacterial tracheitis, epiglottitis, PTA, RPA, Ludwig's  angina  Additional history obtained: Additional history obtained from spouse Records reviewed Care Everywhere/External Records  ED Course and Reassessment: 61 year old female who presents to the emergency department with sore throat. She is ill-appearing but overall nontoxic, nonseptic in appearance.  Her vitals are stable. Her oropharynx is mildly erythematous and she has some mild bilateral tonsillar swelling without visualized exudates.  The posterior oropharynx is somewhat difficult to visualize even with tongue depressor, however I do not visualize any PTA, uvula deviation.  There is no stridor on exam.  She does have bilateral cervical lymphadenopathy that is mildly tender. Basic labs obtained as well as COVID, flu, strep.  All of these were normal.  I have considered CT soft tissue of the neck given her lymphadenopathy however given her lack of current fever, nontoxic appearance, no leukocytosis and physical exam, feel that it is appropriate to try oral antibiotics for lymphadenitis.  Her symptoms are inconsistent with a viral upper respiratory infection, bacterial tracheitis, epiglottitis, RPA, Ludwig's angina.   Additionally I had Dr. Hart Rochester, ED attending evaluate the patient he agrees with oral Augmentin over the next week.  We will follow-up with her primary care provider in 3 to 4 days for symptom recheck.  Given return precautions if she is unable to swallow liquids, difficulty breathing, drooling.   She verbalized understanding.  She is in agreement with this plan.  I answered all of her questions at bedside.  Feel that she is safe for discharge at this time  Independent labs interpretation:  The following labs were independently interpreted: COVID, flu, strep negative, no leukocytosis, electrolyte dysfunction, endorgan dysfunction  Independent visualization of imaging: - I independently visualized the following imaging with scope of interpretation limited to determining acute life threatening conditions related to emergency care: Not indicated  Consultation: - Consulted or discussed management/test interpretation w/ external professional: Not indicated  Consideration for admission or further workup: Could consider ct soft tissue of the neck given her lymphadenopathy, but otherwise nontoxic in appearance.  Will trial oral antibiotics. Social Determinants of health: None identified Final Clinical Impression(s) / ED Diagnoses Final diagnoses:  Lymphadenitis    Rx / DC Orders ED Discharge Orders     None         Cristopher Peru, PA-C 08/17/22 1352    Ernie Avena, MD 08/17/22 2140

## 2022-08-17 NOTE — Discharge Instructions (Signed)
You were seen in the emergency department for sore throat, your labs and swabs were all normal. We are going to place you on antibiotics, Augmentin, due to the swollen, painful lymph nodes in your neck. I would like for you to check up on your symptoms with your primary care provider in 3 days. Please return if your symptoms are worsening such as not being able to swallow water or having difficulty breathing or shortness of breath.

## 2022-08-17 NOTE — ED Triage Notes (Signed)
Pt was concerned for strep- sore throat x 1 week. Pt states she has been feeling more worn down the past month.   Pt is concerned that it is more than a sore throat, due to MS.   Pt has had fever of 102 at home.

## 2022-08-17 NOTE — ED Notes (Signed)
Discharge paperwork given and verbally understood. 

## 2022-09-07 ENCOUNTER — Other Ambulatory Visit: Payer: Self-pay | Admitting: Neurology

## 2022-09-07 MED ORDER — HYDROCODONE-ACETAMINOPHEN 7.5-325 MG PO TABS
ORAL_TABLET | ORAL | 0 refills | Status: DC
Start: 2022-09-07 — End: 2022-09-12

## 2022-09-07 NOTE — Telephone Encounter (Signed)
Pt has an up coming appt and has been checked in the registry. 

## 2022-09-12 ENCOUNTER — Other Ambulatory Visit (HOSPITAL_COMMUNITY): Payer: Self-pay

## 2022-09-12 ENCOUNTER — Other Ambulatory Visit: Payer: Self-pay | Admitting: *Deleted

## 2022-09-12 ENCOUNTER — Telehealth: Payer: Self-pay | Admitting: Neurology

## 2022-09-12 MED ORDER — HYDROCODONE-ACETAMINOPHEN 7.5-325 MG PO TABS
2.0000 | ORAL_TABLET | Freq: Three times a day (TID) | ORAL | 0 refills | Status: DC | PRN
  Filled 2022-09-12: qty 180, 30d supply, fill #0

## 2022-09-12 NOTE — Telephone Encounter (Signed)
Patient stated urgent message regarding the refill.

## 2022-09-12 NOTE — Telephone Encounter (Signed)
Sent to covering MD, Dr. Brett Fairy since Dr. Felecia Shelling out.

## 2022-09-12 NOTE — Telephone Encounter (Signed)
Please resend patients prescription for Hydrocodone to [facility]. Originally sent to [facility] in [place]  who doesn't have to fill. [facility] are out she stated.Patient has none left. Thank you

## 2022-09-19 ENCOUNTER — Other Ambulatory Visit: Payer: Self-pay | Admitting: Neurology

## 2022-09-20 MED ORDER — METHYLPHENIDATE HCL 10 MG PO TABS: 20.0000 mg | ORAL_TABLET | Freq: Three times a day (TID) | ORAL | 0 refills | Status: DC

## 2022-09-20 NOTE — Telephone Encounter (Signed)
Pt last appt was on 8/31 and has an up coming appt on 12/07/22. Pt last refill in the registry was on 9/22. Pt due for refill.

## 2022-10-05 ENCOUNTER — Other Ambulatory Visit: Payer: Self-pay | Admitting: Family Medicine

## 2022-10-05 ENCOUNTER — Other Ambulatory Visit (HOSPITAL_COMMUNITY): Payer: Self-pay

## 2022-10-05 ENCOUNTER — Other Ambulatory Visit (HOSPITAL_BASED_OUTPATIENT_CLINIC_OR_DEPARTMENT_OTHER): Payer: Self-pay

## 2022-10-05 MED ORDER — HYDROCODONE-ACETAMINOPHEN 10-325 MG PO TABS
1.0000 | ORAL_TABLET | Freq: Four times a day (QID) | ORAL | 0 refills | Status: DC
  Filled 2022-10-05 – 2022-10-10 (×2): qty 120, 30d supply, fill #0

## 2022-10-05 NOTE — Telephone Encounter (Signed)
Dr. Felecia Shelling- are you ok with changing her prescription? If so, what would you like?

## 2022-10-05 NOTE — Telephone Encounter (Signed)
LVM for pt letting her know that Dr. Felecia Shelling approved changing her medication to hydrocodone 10-325mg , 1 po 4 times daily and we will send to [facility] as requested. Asked her to call back if she has any questions.

## 2022-10-05 NOTE — Telephone Encounter (Signed)
Pt is having an issue with medication HYDROcodone-acetaminophen (NORCO) 7.5-325 MG tablet, [facility] is out of medication.  Pt states she spoke with [facility] and they do have the medication 10 mg. Pt is asking if she can get this medication in a smaller amount.  Pt states she will need the medicine next week and every time getting it has been a problem.  Would like a call to discuss if she can get this medication at [facility] in the 10 mg. Since the [facility] will be out of pharmacy medication by next week.

## 2022-10-06 ENCOUNTER — Other Ambulatory Visit (HOSPITAL_BASED_OUTPATIENT_CLINIC_OR_DEPARTMENT_OTHER): Payer: Self-pay

## 2022-10-10 ENCOUNTER — Other Ambulatory Visit (HOSPITAL_BASED_OUTPATIENT_CLINIC_OR_DEPARTMENT_OTHER): Payer: Self-pay

## 2022-10-20 ENCOUNTER — Other Ambulatory Visit: Payer: Self-pay | Admitting: Neurology

## 2022-10-20 MED ORDER — METHYLPHENIDATE HCL 10 MG PO TABS
20.0000 mg | ORAL_TABLET | Freq: Three times a day (TID) | ORAL | 0 refills | Status: DC
Start: 2022-10-20 — End: 2022-11-22

## 2022-11-01 ENCOUNTER — Other Ambulatory Visit: Payer: Self-pay | Admitting: Neurology

## 2022-11-02 ENCOUNTER — Other Ambulatory Visit (HOSPITAL_BASED_OUTPATIENT_CLINIC_OR_DEPARTMENT_OTHER): Payer: Self-pay

## 2022-11-02 MED ORDER — HYDROCODONE-ACETAMINOPHEN 7.5-325 MG PO TABS
2.0000 | ORAL_TABLET | Freq: Three times a day (TID) | ORAL | 0 refills | Status: DC | PRN
  Filled 2022-11-02: qty 180, 30d supply, fill #0

## 2022-11-02 NOTE — Telephone Encounter (Signed)
Pt last seen 08/04/22 and next f/u 12/07/22. Per drug registry, last refilled 10/10/22 #120 (normally gets #180)

## 2022-11-06 ENCOUNTER — Other Ambulatory Visit (HOSPITAL_BASED_OUTPATIENT_CLINIC_OR_DEPARTMENT_OTHER): Payer: Self-pay

## 2022-11-07 ENCOUNTER — Other Ambulatory Visit (HOSPITAL_BASED_OUTPATIENT_CLINIC_OR_DEPARTMENT_OTHER): Payer: Self-pay

## 2022-11-22 ENCOUNTER — Other Ambulatory Visit: Payer: Self-pay | Admitting: Neurology

## 2022-11-22 ENCOUNTER — Other Ambulatory Visit (HOSPITAL_BASED_OUTPATIENT_CLINIC_OR_DEPARTMENT_OTHER): Payer: Self-pay | Admitting: Internal Medicine

## 2022-11-22 DIAGNOSIS — Z1231 Encounter for screening mammogram for malignant neoplasm of breast: Secondary | ICD-10-CM

## 2022-11-22 MED ORDER — METHYLPHENIDATE HCL 10 MG PO TABS
20.0000 mg | ORAL_TABLET | Freq: Three times a day (TID) | ORAL | 0 refills | Status: DC
Start: 2022-11-22 — End: 2022-12-20

## 2022-11-23 ENCOUNTER — Telehealth: Payer: Self-pay | Admitting: Internal Medicine

## 2022-11-23 ENCOUNTER — Other Ambulatory Visit: Payer: Self-pay

## 2022-11-23 ENCOUNTER — Ambulatory Visit (HOSPITAL_BASED_OUTPATIENT_CLINIC_OR_DEPARTMENT_OTHER)
Admission: RE | Admit: 2022-11-23 | Discharge: 2022-11-23 | Disposition: A | Discharge status: Home / Self Care | Payer: 59 | Source: Ambulatory Visit | Attending: Internal Medicine | Admitting: Internal Medicine

## 2022-11-23 DIAGNOSIS — N644 Mastodynia: Secondary | ICD-10-CM

## 2022-11-23 DIAGNOSIS — Z1231 Encounter for screening mammogram for malignant neoplasm of breast: Secondary | ICD-10-CM | POA: Insufficient documentation

## 2022-11-23 NOTE — Telephone Encounter (Signed)
Patient complaining of right breast pain and Tech requests right diagnostic mammogram by [facility]. This has been ordered just now. MJB, MD

## 2022-12-01 ENCOUNTER — Other Ambulatory Visit: Payer: Self-pay | Admitting: Neurology

## 2022-12-01 ENCOUNTER — Other Ambulatory Visit (HOSPITAL_BASED_OUTPATIENT_CLINIC_OR_DEPARTMENT_OTHER): Payer: Self-pay

## 2022-12-01 MED ORDER — HYDROCODONE-ACETAMINOPHEN 7.5-325 MG PO TABS
2.0000 | ORAL_TABLET | Freq: Three times a day (TID) | ORAL | 0 refills | Status: DC | PRN
  Filled 2022-12-01 – 2022-12-06 (×2): qty 180, 30d supply, fill #0

## 2022-12-01 NOTE — Telephone Encounter (Signed)
[place] [facility] Drug registry Verified-Hydrocodone-Acetamin 7.5-325 Last refill-11/07/22 Qty: 180 for 30 days Last OV: 08/04/22 Pending appointment:  12/07/22

## 2022-12-02 ENCOUNTER — Telehealth: Payer: Self-pay | Admitting: Neurology

## 2022-12-02 NOTE — Telephone Encounter (Signed)
I have called patient, she complains of extreme fatigue, progressively getting worse over the past few days, to the point of difficulty talking,  She has been off Tecfidera since February 2023," no flareup, I have aged out", she denies signs of active infection,  Has not taken Xanax for few month, on hydrocodone 7.5 up to 6 tablets a day, today she took 2 and half tablets, also on Ritalin 10mg , last dose 2 hours ago, failed to improve her fatigue,  She is wondering if she should go to emergency room.  Advised patient if she feel rapid worsening of her functional status, may go to emergency room to rule out active infection, even consider steroid treatment, otherwise she has appointment scheduled with our office December 07, 2022,

## 2022-12-06 ENCOUNTER — Other Ambulatory Visit (HOSPITAL_BASED_OUTPATIENT_CLINIC_OR_DEPARTMENT_OTHER): Payer: Self-pay

## 2022-12-06 NOTE — Patient Instructions (Signed)
Below is our plan:  We will continue current treatment plan. I will update labs, today. I will order and MRI of the brain. I would like for you to call your PCP and ask for a visit to look at your blood pressure and pulse readings. Your pulse looks ok, today, but you have reported lower readings at home. Your blood pressure is quite elevated in the office. Please seek emergency medical attention as discussed for any new or worsening symptoms.   Please make sure you are staying well hydrated. I recommend 50-60 ounces daily. Well balanced diet and regular exercise encouraged. Consistent sleep schedule with 6-8 hours recommended.   Please continue follow up with care team as directed.   Follow up with Dr Epimenio Foot in 4-6 months   You may receive a survey regarding today's visit. I encourage you to leave honest feed back as I do use this information to improve patient care. Thank you for seeing me today!

## 2022-12-06 NOTE — Progress Notes (Unsigned)
No chief complaint on file.    HISTORY OF PRESENT ILLNESS: 12/06/22 ALL:  [name] is a 62 y.o. female here today for follow up for RRMS. She reamins off Tecfidera. Labs have been stable. MR brain, cervical and thoracic spine stable 05/2022.   She .Marland KitchenShe has had more fatigue. She continues Ritalin 20mg  TID.  Pain controlled on hydrocodone 7.5-325mg  two tablets every 8 hours as needed. PDMP appropriate.   Hyoscyamine as needed helps with urinary frequency.   Alprazolam 0.5mg  up to TID as needed for anxiety and sleep.   She continues vitamin D and B12 supplements. She has been taking ..weekly of D and twice monthly of B12.    HISTORY (copied from Dr previous note)  [name] is a 62 y.o. woman with relapsing remitting multiple sclerosis diagnosed in 2008.     Update 08/04/2022: She stopped the Tecfidera after her imaging in February 2023 and is doing well.  No exacerbations.   She still has a lot of symptoms  . She feels her MS is stable.      She notes gait is worse but has more right knee issues as well.  Right leg is weaker than left and she has a foot drop at times.  She tires out very easily.   She wakes up to urinate 3-4 times a night which is better.    She wears a Poise pad due to occasional incontinence..    She usually falls back asleep easily.   She denies snoring.   She feels less depressed off any antidepressant.   She rarely takes a single xanax 0.25 mg at night for bad insomnia but has a hangover the next day.   She has had more fatigue. Brand name Ritalin helps her ADD the most but is not covered.    Adderall 20 mg po tid works better than methylphenidate.      She has pain in her back and spasms in back and legs, helped by hydrocodone (she takes 6 x 7.5 mg daily).  Gabapentin (even 300 mg) causes sleepiness and decreased function and slower the next day..   She is compliant (PDMP has been checked).  She has not shown any drug-seeking behavior.   Hhdrocodone has helped her pain the most.   When pain flares, she feels her walking does worse.     Sometimes has neck pain and occipital pain.    Thoracic pain has been worse recently.       Vit D 50000 units causes heart racing but lower dose daily is tolerated.       Vit B12 was also low in the past.        MS History:  In 2008, she had an MRI of the brain performed which was consistent with multiple sclerosis and she was diagnosed with relapsing remitting MS. He was initially placed on Avonex for therapy. She had some exacerbations with weakness and clumsiness with her gait and she started to see Dr. 2009. He placed her on Tysabri she is on Tysabri between 2010 and 2013. 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    Imaging Review: 12/13/17 MRI of the cervical and thoracic spine:  She has multiple T2 hyperintense lesions in the spinal cord the largest to the right C2-C3 and smaller ones at C3-C4, C4, C5-C6, C6-C7 and T1-T2 all  the foci were present compared to the 2016 MRI.   The MRI of the thoracic spine showed small foci adjacent to T2 and T5-6.  None of the spinal plaques appeared to be acute.     MRI 02/25/2020 brain:   Multiple T2/FLAIR hyperintense foci in the hemispheres in a pattern and configuration consistent with chronic demyelinating plaque associated with multiple sclerosis.  None of the foci appears to be acute.  Compared to the MRI from 03/18/2017, there is no interval change.    MRI 2016, 2017 and 2018 showed no new lesions   MRI of the brain 05/19/2022 showed no new lesions.   MRI of the thoracic spine 05/29/2022 showed no new lesions.   MRI of the lumbar spine 05/29/2022 showed At L3-L4, there is mild to moderate spinal stenosis due to disc protrusion and other degenerative change.  Additionally there is moderate right foraminal narrowing and moderate bilateral lateral recess stenosis.   There does not appear to be nerve root compression.   Milder degenerative changes at other lumbar levels not causing spinal stenosis or nerve root compression.   REVIEW OF SYSTEMS: Out of a complete 14 system review of symptoms, the patient complains only of the following symptoms, chronic pain, fatigue, anxiety, insomnia and all other reviewed systems are negative.   ALLERGIES: Allergies  Allergen Reactions   Ibuprofen Other (See Comments)    CAUSES LYMPHEDEMA   Nsaids Other (See Comments)    AVOIDS ANY SODIUM-CONTAINING MED. -- CAUSES LYMPHEDEMA   Other Swelling and Other (See Comments)    Laxatives and artificial sweeteners- cause leg swelling   Vitamin D Analogs Palpitations and Other (See Comments)    Cannot tolerate at 50,000 units OR even 2,000 units     HOME MEDICATIONS: Outpatient Medications Prior to Visit  Medication Sig Dispense Refill   ALPRAZolam (XANAX) 0.25 MG tablet Take 1 tablet (0.25 mg total) by mouth at bedtime as needed for anxiety. 90 tablet 1   cyanocobalamin (VITAMIN B12) 1000 MCG/ML injection INJECT 1 ML TWICE PER MONTH 2 mL 11   HYDROcodone-acetaminophen (NORCO) 10-325 MG tablet Take 1 tablet by mouth in the morning, at noon, in the evening, and at bedtime. 120 tablet 0   HYDROcodone-acetaminophen (NORCO) 7.5-325 MG tablet Take 2 tablets by mouth every 8 (eight) hours as needed. 180 tablet 0   hyoscyamine (LEVSIN SL) 0.125 MG SL tablet Place 1 tablet (0.125 mg total) under the tongue every 4 (four) hours as needed. (Patient taking differently: Place 0.125 mg under the tongue every 4 (four) hours as needed for cramping.) 270 tablet 3   methylphenidate (RITALIN) 10 MG tablet Take 2 tablets (20 mg total) by mouth 3 (three) times daily with meals. [facility] Brand 180 tablet 0   metoprolol tartrate (LOPRESSOR) 50 MG tablet TAKE 1/2 TABLET BY MOUTH EVERY 12 HOURS AS NEEDED FOR PALPITATIONS 30 tablet 5   Syringe/Needle, Disp, (SYRINGE 3CC/23GX1") 23G X 1" 3 ML  MISC 3 Syringes by Does not apply route every 30 (thirty) days. (Patient taking differently: 3 Syringes by Does not apply route every 30 (thirty) days. 26Gx5/6") 3 each 3   TECFIDERA 240 MG CPDR TAKE 1 CAPSULE (240MG ) BY  MOUTH TWICE DAILY 60 capsule 11   No facility-administered medications prior to visit.     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   Headache(784.0)    History of concussion YOUNG ADULT--  NO RESIDUAL   IBS (irritable bowel syndrome)    Movement disorder    MS (multiple sclerosis) (HCC)    MVP (mitral valve prolapse) HX HEART RACING  YRS AGO   ASYMPTOMATIC SINCE THEN   Vision abnormalities    Weakness of right leg SECONDARY TO MS     PAST SURGICAL HISTORY: Past Surgical History:  Procedure Laterality Date   CESAREAN SECTION  X3   BILATERAL TUBAL LIGATION W/ LAST ONE   DESTRUCTION OF ANAL CONDYLOMA  07-07-2005  DR [place]   HERNIA REPAIR     KNEE ARTHROSCOPY WITH LATERAL MENISECTOMY  10/26/2012   Procedure: KNEE ARTHROSCOPY WITH LATERAL MENISECTOMY;  Surgeon: Johnn Hai, MD;  Location: [facility];  Service: Orthopedics;  Laterality: Left;  Left Knee Arthrsocopy with Partial Lateral Menisectomy   LAPAROSCOPIC ASSISTED VAGINAL HYSTERECTOMY  01-15-2002  DR Delfino Lovett [place]/ DR TIM DAVIS   AND REPAIR VENTRAL HERNIA   LASER ABLATION CONDOLAMATA N/A 04/26/2013   Procedure: LASER ABLATION CONDOLAMATA;  Surgeon: Leighton Ruff, MD;  Location: [facility];  Service: General;  Laterality: N/A;   TONSILLECTOMY     TOTAL KNEE ARTHROPLASTY Left 11/05/2015   Procedure: LEFT TOTAL KNEE ARTHROPLASTY;  Surgeon: Susa Day, MD;  Location: [facility];  Service: Orthopedics;  Laterality: Left;      FAMILY HISTORY: Family History  Problem Relation Age of Onset   Diabetes Mother    Hypertension Mother    Stroke Mother    Bowel Disease Brother    Diabetes Brother    Healthy Brother    Emphysema Father    Heart attack Father      SOCIAL HISTORY: Social History   Socioeconomic History   Marital status: Married    Spouse name: Not on file   Number of children: 3   Years of education: Not on file   Highest education level: Not on file  Occupational History   Not on file  Tobacco Use   Smoking status: Never   Smokeless tobacco: Never  Substance and Sexual Activity   Alcohol use: No   Drug use: No   Sexual activity: Not on file  Other Topics Concern   Not on file  Social History Narrative   Not on file   Social Determinants of Health   Financial Resource Strain: Not on file  Food Insecurity: Not on file  Transportation Needs: Not on file  Physical Activity: Not on file  Stress: Not on file  Social Connections: Not on file  Intimate Partner Violence: Not on file      PHYSICAL EXAM  There were no vitals filed for this visit.  There is no height or weight on file to calculate BMI.   Generalized: Well developed, in no acute distress  Cardiology: normal rate and rhythm, no murmur auscultated  Respiratory: clear to auscultation bilaterally    Neurological examination  Mentation: Alert oriented to time, place, history taking. Follows all commands speech and language fluent Cranial nerve II-XII: Pupils were equal round reactive to light. Extraocular movements were full, visual field were full on confrontational test. Facial sensation and strength were normal. Uvula tongue midline. Head turning and shoulder shrug  were normal and symmetric. Motor:  The motor testing reveals 5 over 5 strength of all 4 extremities. Tone increased in legs.  Sensory: Sensory testing is intact to soft touch on all 4 extremities. No evidence of extinction is noted.   Coordination: Cerebellar testing reveals good finger-nose-finger and heel-to-shin bilaterally.  Gait and station: Normal station, gait mildly wide, stable without assistive device. Difficulty with Tandem.   Reflexes: Deep tendon reflexes are symmetric and brisk bilaterally.     DIAGNOSTIC DATA (LABS, IMAGING, TESTING) - I reviewed patient records, labs, notes, testing and imaging myself where available.  Lab Results  Component Value Date   WBC 7.4 08/17/2022   HGB 14.0 08/17/2022   HCT 43.1 08/17/2022   MCV 92.5 08/17/2022   PLT 242 08/17/2022      Component Value Date/Time   NA 141 08/17/2022 1234   NA 141 03/31/2021 1056   K 3.7 08/17/2022 1234   CL 100 08/17/2022 1234   CO2 32 08/17/2022 1234   GLUCOSE 88 08/17/2022 1234   BUN 14 08/17/2022 1234   BUN 19 03/31/2021 1056   CREATININE 0.82 08/17/2022 1234   CREATININE 0.84 07/08/2022 0930   CALCIUM 9.4 08/17/2022 1234   PROT 7.4 08/17/2022 1234   PROT 7.1 03/31/2021 1056   ALBUMIN 4.4 08/17/2022 1234   ALBUMIN 4.1 03/31/2021 1056   AST 19 08/17/2022 1234   ALT 18 08/17/2022 1234   ALKPHOS 106 08/17/2022 1234   BILITOT 0.4 08/17/2022 1234   BILITOT 0.6 03/31/2021 1056   GFRNONAA >60 08/17/2022 1234   GFRNONAA 86 09/15/2020 1120   GFRAA 100 09/15/2020 1120   Lab Results  Component Value Date   CHOL 184 07/08/2022   HDL 89 07/08/2022   LDLCALC 80 07/08/2022   TRIG 71 07/08/2022   CHOLHDL 2.1 07/08/2022   Lab Results  Component Value Date   HGBA1C  03/10/2010    5.5 (NOTE) The ADA recommends the following therapeutic goal for glycemic control related to Hgb A1c measurement: Goal of therapy: <6.5 Hgb A1c  Reference: American Diabetes Association: Clinical Practice Recommendations 2010, Diabetes Care, 2010, 33: (Suppl  1).   Lab Results  Component Value Date   VITAMINB12 1,532 (H) 03/31/2021   Lab Results  Component Value Date   TSH 2.11 07/08/2022        No data to display               No  data to display           ASSESSMENT AND PLAN  62 y.o. year old female  has a past medical history of ADD (attention deficit disorder), Anxiety, Arthritis, Bladder incontinence, Bone spur, Chronic fatigue, Dysrhythmia, Generalized neuropathy, Headache(784.0), History of concussion (YOUNG ADULT--  NO RESIDUAL), IBS (irritable bowel syndrome), Movement disorder, MS (multiple sclerosis) (HCC), MVP (mitral valve prolapse) (HX HEART RACING  YRS AGO), Vision abnormalities, and Weakness of right leg (SECONDARY TO MS). here with   No diagnosis found.  [name] is doing fairly well, today. She has had more fatigue since over the past few months and was diagnosed with Covid 03/2021. No new or exacerbating MS symptoms. We will continue current treatment plan. PDMP reviewed and appropriate. We will update labs, today. She will continue healthy lifestyle habits. She will follow up in 6 months with Dr Epimenio Foot.    No orders of the defined types were placed in this encounter.    No orders of the defined types were placed in this encounter.   Shawnie Dapper, MSN, FNP-C  12/06/2022, 1:58 PM  [facility] [address] [place], [place] [address] [phone]

## 2022-12-07 ENCOUNTER — Ambulatory Visit: Payer: 59 | Admitting: Family Medicine

## 2022-12-07 ENCOUNTER — Ambulatory Visit (INDEPENDENT_AMBULATORY_CARE_PROVIDER_SITE_OTHER): Payer: 59

## 2022-12-07 ENCOUNTER — Encounter: Payer: Self-pay | Admitting: Family Medicine

## 2022-12-07 ENCOUNTER — Telehealth: Payer: Self-pay | Admitting: Internal Medicine

## 2022-12-07 VITALS — BP 172/100 | HR 71 | Ht 65.0 in | Wt 150.0 lb

## 2022-12-07 VITALS — BP 170/90 | HR 96 | Temp 98.4°F | Wt 149.1 lb

## 2022-12-07 DIAGNOSIS — E538 Deficiency of other specified B group vitamins: Secondary | ICD-10-CM

## 2022-12-07 DIAGNOSIS — F988 Other specified behavioral and emotional disorders with onset usually occurring in childhood and adolescence: Secondary | ICD-10-CM | POA: Diagnosis not present

## 2022-12-07 DIAGNOSIS — Z0189 Encounter for other specified special examinations: Secondary | ICD-10-CM | POA: Diagnosis not present

## 2022-12-07 DIAGNOSIS — E559 Vitamin D deficiency, unspecified: Secondary | ICD-10-CM

## 2022-12-07 DIAGNOSIS — Z79899 Other long term (current) drug therapy: Secondary | ICD-10-CM | POA: Diagnosis not present

## 2022-12-07 DIAGNOSIS — Z79891 Long term (current) use of opiate analgesic: Secondary | ICD-10-CM | POA: Diagnosis not present

## 2022-12-07 DIAGNOSIS — R5382 Chronic fatigue, unspecified: Secondary | ICD-10-CM

## 2022-12-07 DIAGNOSIS — G35 Multiple sclerosis: Secondary | ICD-10-CM | POA: Diagnosis not present

## 2022-12-07 DIAGNOSIS — M545 Low back pain, unspecified: Secondary | ICD-10-CM

## 2022-12-07 DIAGNOSIS — G8929 Other chronic pain: Secondary | ICD-10-CM

## 2022-12-07 LAB — POCT URINALYSIS DIPSTICK
Bilirubin, UA: NEGATIVE
Blood, UA: NEGATIVE
Glucose, UA: NEGATIVE
Ketones, UA: NEGATIVE
Leukocytes, UA: NEGATIVE
Nitrite, UA: NEGATIVE
Protein, UA: NEGATIVE
Spec Grav, UA: 1.015 (ref 1.010–1.025)
Urobilinogen, UA: 0.2 E.U./dL
pH, UA: 6.5 (ref 5.0–8.0)

## 2022-12-07 NOTE — Telephone Encounter (Signed)
[name] [phone]  [name] stopped by office after going to see [name] and said her blood presser was high while she was there 173/100 and she was urinating a lot, so I put her on schedule to recheck BP and test for UTI.

## 2022-12-08 ENCOUNTER — Telehealth: Payer: Self-pay

## 2022-12-08 LAB — COMPREHENSIVE METABOLIC PANEL
ALT: 26 IU/L (ref 0–32)
AST: 26 IU/L (ref 0–40)
Albumin/Globulin Ratio: 1.7 (ref 1.2–2.2)
Albumin: 4.4 g/dL (ref 3.9–4.9)
Alkaline Phosphatase: 109 IU/L (ref 44–121)
BUN/Creatinine Ratio: 15 (ref 12–28)
BUN: 14 mg/dL (ref 8–27)
Bilirubin Total: 0.6 mg/dL (ref 0.0–1.2)
CO2: 26 mmol/L (ref 20–29)
Calcium: 9.4 mg/dL (ref 8.7–10.3)
Chloride: 100 mmol/L (ref 96–106)
Creatinine, Ser: 0.95 mg/dL (ref 0.57–1.00)
Globulin, Total: 2.6 g/dL (ref 1.5–4.5)
Glucose: 94 mg/dL (ref 70–99)
Potassium: 4.4 mmol/L (ref 3.5–5.2)
Sodium: 140 mmol/L (ref 134–144)
Total Protein: 7 g/dL (ref 6.0–8.5)
eGFR: 67 mL/min/{1.73_m2} (ref >59)

## 2022-12-08 LAB — CBC WITH DIFFERENTIAL/PLATELET
Basophils Absolute: 0 10*3/uL (ref 0.0–0.2)
Basos: 0 %
EOS (ABSOLUTE): 0 10*3/uL (ref 0.0–0.4)
Eos: 1 %
Hematocrit: 46 % (ref 34.0–46.6)
Hemoglobin: 15.2 g/dL (ref 11.1–15.9)
Immature Grans (Abs): 0 10*3/uL (ref 0.0–0.1)
Immature Granulocytes: 0 %
Lymphocytes Absolute: 1.5 10*3/uL (ref 0.7–3.1)
Lymphs: 35 %
MCH: 29.9 pg (ref 26.6–33.0)
MCHC: 33 g/dL (ref 31.5–35.7)
MCV: 90 fL (ref 79–97)
Monocytes Absolute: 0.3 10*3/uL (ref 0.1–0.9)
Monocytes: 8 %
Neutrophils Absolute: 2.3 10*3/uL (ref 1.4–7.0)
Neutrophils: 56 %
Platelets: 264 10*3/uL (ref 150–450)
RBC: 5.09 x10E6/uL (ref 3.77–5.28)
RDW: 11.9 % (ref 11.7–15.4)
WBC: 4.2 10*3/uL (ref 3.4–10.8)

## 2022-12-08 LAB — VITAMIN D 25 HYDROXY (VIT D DEFICIENCY, FRACTURES): Vit D, 25-Hydroxy: 21.2 ng/mL — ABNORMAL LOW (ref 30.0–100.0)

## 2022-12-08 LAB — VITAMIN B12: Vitamin B-12: 1332 pg/mL — ABNORMAL HIGH (ref 232–1245)

## 2022-12-08 NOTE — Telephone Encounter (Signed)
Patient called this morning stating her B/p reading has improved 109/62 6 am, 128/88 standing, 104/71 sitting, pulse 46, 138/85 standing, pulse 50 last B/p reading 126/64 pulse 49 Patient is concerned with low pulse readings I informed patient metoprolol can cause pulse readings to be low but patient stated her pulse readings were low last week without Rx please advise

## 2022-12-08 NOTE — Progress Notes (Signed)
Patient was seen earlier at Neurologist office and had elevated B/p,  she was told to schedule an appt with PCP and patient decided to walk-in to see if she could be seen today.  PCP was out of the office so a nurse visit was scheduled to check B/p and urine for possible infection.  Blood pressure noted. Patient is to take Metoprolol and call with BP reading in am. OV in the next few days to evaluate BP.  I was out of the office when she came without an appt.  This is a nurse visit only.  MJB, MD

## 2022-12-08 NOTE — Patient Instructions (Signed)
Please guve her an appt for follow up next week. Take metoprolol and call us with BP reading tomorrow.

## 2022-12-08 NOTE — Telephone Encounter (Signed)
After talking with Dr Renold Genta I let patient know she should go home and take her medication and rest and call us the next day to tell us how she is feeling. She did say her head was hurting some. She verbalized understanding and will call on Thursday 12/08/2022

## 2022-12-08 NOTE — Telephone Encounter (Signed)
Scheduled

## 2022-12-09 ENCOUNTER — Telehealth: Payer: Self-pay | Admitting: Family Medicine

## 2022-12-09 ENCOUNTER — Ambulatory Visit (INDEPENDENT_AMBULATORY_CARE_PROVIDER_SITE_OTHER): Payer: 59 | Admitting: Internal Medicine

## 2022-12-09 ENCOUNTER — Encounter: Payer: Self-pay | Admitting: Internal Medicine

## 2022-12-09 VITALS — BP 110/74 | HR 61 | Temp 98.5°F | Ht 65.0 in | Wt 154.4 lb

## 2022-12-09 DIAGNOSIS — F32A Depression, unspecified: Secondary | ICD-10-CM

## 2022-12-09 DIAGNOSIS — R5383 Other fatigue: Secondary | ICD-10-CM | POA: Diagnosis not present

## 2022-12-09 DIAGNOSIS — G35 Multiple sclerosis: Secondary | ICD-10-CM | POA: Diagnosis not present

## 2022-12-09 DIAGNOSIS — R001 Bradycardia, unspecified: Secondary | ICD-10-CM

## 2022-12-09 DIAGNOSIS — Z8679 Personal history of other diseases of the circulatory system: Secondary | ICD-10-CM

## 2022-12-09 DIAGNOSIS — Z87898 Personal history of other specified conditions: Secondary | ICD-10-CM

## 2022-12-09 DIAGNOSIS — E559 Vitamin D deficiency, unspecified: Secondary | ICD-10-CM | POA: Diagnosis not present

## 2022-12-09 DIAGNOSIS — F439 Reaction to severe stress, unspecified: Secondary | ICD-10-CM

## 2022-12-09 DIAGNOSIS — F419 Anxiety disorder, unspecified: Secondary | ICD-10-CM

## 2022-12-09 NOTE — Telephone Encounter (Signed)
[facility] NPR case #[id number], medicare NPR sent to GI [phone]

## 2022-12-09 NOTE — Progress Notes (Incomplete Revision)
 Subjective:    Patient ID: [name], female    DOB: 10/10/1961, 61 y.o.   MRN: [id number]  HPI Has not been feeling well recently. Has been quite fatigued and words have been slurred a day or so last week.  BP readings well taken last week 109/59 107/72, 114/60. Says heart rate has been in the 45-50 more than once. Not taking metoprolol. Remote hx of MVP. No palpitations. Very tired and lightheaded. Grandchildren have been living with her for a year. They left for the holidays and she still felt bad. No fever. She wondered if she had a viral infection. Did not have runny nose. Took a new brand Vitamin D. Vitamin D  is 1000 units only taking one capsule. Suggest 2 capsules.B12 was slightly high. Does home injections q month.Not anemic. CBC and C-met were normal. Will be getting MRI in the near future.Says she cannot take high dose Vitamin D because heart races. Vit D level was 21.2. She will increase her dose OTC.  She she had health maintenance exam here in August 2023.  She had stopped Tecfidera Dara in mid February.  She does take Ritalin for attention deficit and it helps her energy.  She does not tolerate gabapentin.  She does take hydrocodone APAP for chronic musculoskeletal pain related to MS.  We do this infrequently.  She had COVID-19 in April 2022.  Did not take Paxlovid and recovered.  History of pneumonia requiring hospitalization February 2023.  This was right lower lobe pneumonia and she improved with ceftriaxone and azithromycin in the hospital and was discharged home on amoxicillin and azithromycin.  Sometimes an injection of Depo-Medrol helps her fatigue with MS.  Takes metoprolol intermittently whenever she has palpitations but does not need that very often at all.  Review of Systems when was feeling bad a couple of weeks ago heart rate was 40  Would like to check iron and magnesium. Getting a MRI in the near future and has appt to see Dr. [name] in about 4 months. Very  stressful at her house she says.  Her son is not drinking but lives with her.  Patient is on generic Ritalin for energy and ADD. Adderall does not work as well and causes headaches.  Note: Due to hx of MS patient is reluctant to take vaccines and declines flu vaccine.     Objective:   Physical Exam  Temperature 98.5 degrees, pulse 61 regular, blood pressure 110/74 pulse oximetry 98% weight 154 pounds 6.4 ounces height 5 feet 5 inches BMI 25.69 She looks fatigued.  She is slightly pale.  Skin: Warm and dry.  Nodes none.  Chest clear.  Cardiac exam: Regular rate and rhythm.  No lower extremity pitting edema.     Assessment & Plan:  History of multiple sclerosis followed at [facility]  Fatigue likely related to situational stress at home and multiple sclerosis.  Ritalin seems to help.  Checking iron level, magnesium level, sed rate, rheumatoid factor and ANA.  Musculoskeletal pain treated with hydrocodone APAP.  Have drawn rheumatology studies today to see if there is a rheumatology reason for her pain and discomfort  Anxiety treated with Xanax  Situational family stress-does not want to go to counseling and does not want to be on antidepressants.  This is longstanding.  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  She has lumbar scoliosis and lower thoracic pain.  Has been seen at [facility] in August.    Complains of chronic neck and back pain.  Complains of stabbing and burning pain throughout her back as well as neck pain and headaches and bilateral arm pain.  Cannot tolerate gabapentin due to grogginess.  Tried epidural steroid injections but patient says that that did not help much.  Patient reports episodes of low pulse.  A Zio monitor has been ordered.  Plan: We will draw ANA, TSH, rheumatoid factor, magnesium, iron and iron binding capacity as well as sed rate. Zio monitor to see if she has episodic arrythymia.  Addendum: Her ANA is positive.  Results are  confusing to me.  1 titer is 1: 80 and another titer is reported is 1: 1280.  Pattern is nuclear nucleolar.  However sed rate is only 6.  Rheumatoid factor is negative.  She is being referred to [facility].  

## 2022-12-09 NOTE — Progress Notes (Addendum)
Subjective:    Patient ID: [name], female    DOB: December 05, 1961, 62 y.o.   MRN: [id number]  HPI Has not been feeling well recently. Has been quite fatigued and words have been slurred a day or so last week.  BP readings well taken last week 109/59 107/72, 114/60. Says heart rate has been in the 45-50 more than once. Not taking metoprolol. Remote hx of MVP. No palpitations. Very tired and lightheaded. Grandchildren have been living with her for a year. They left for the holidays and she still felt bad. No fever. She wondered if she had a viral infection. Did not have runny nose. Took a new brand Vitamin D. Vitamin D  is 1000 units only taking one capsule. Suggest 2 capsules.B12 was slightly high. Does home injections q month.Not anemic. CBC and C-met were normal. Will be getting MRI in the near future.Says she cannot take high dose Vitamin D because heart races. Vit D level was 21.2. She will increase her dose OTC.  She she had health maintenance exam here in August 2023.  She had stopped Tecfidera Dara in mid February.  She does take Ritalin for attention deficit and it helps her energy.  She does not tolerate gabapentin.  She does take hydrocodone APAP for chronic musculoskeletal pain related to MS.  We do this infrequently.  She had COVID-19 in April 2022.  Did not take Paxlovid and recovered.  History of pneumonia requiring hospitalization February 2023.  This was right lower lobe pneumonia and she improved with ceftriaxone and azithromycin in the hospital and was discharged home on amoxicillin and azithromycin.  Sometimes an injection of Depo-Medrol helps her fatigue with MS.  Takes metoprolol intermittently whenever she has palpitations but does not need that very often at all.  Review of Systems when was feeling bad a couple of weeks ago heart rate was 40  Would like to check iron and magnesium. Getting a MRI in the near future and has appt to see Dr. Felecia Shelling in about 4 months. Very  stressful at her house she says.  Her son is not drinking but lives with her.  Patient is on generic Ritalin for energy and ADD. Adderall does not work as well and causes headaches.  Note: Due to hx of MS patient is reluctant to take vaccines and declines flu vaccine.     Objective:   Physical Exam  Temperature 98.5 degrees, pulse 61 regular, blood pressure 110/74 pulse oximetry 98% weight 154 pounds 6.4 ounces height 5 feet 5 inches BMI 25.69 She looks fatigued.  She is slightly pale.  Skin: Warm and dry.  Nodes none.  Chest clear.  Cardiac exam: Regular rate and rhythm.  No lower extremity pitting edema.     Assessment & Plan:  History of multiple sclerosis followed at [facility]  Fatigue likely related to situational stress at home and multiple sclerosis.  Ritalin seems to help.  Checking iron level, magnesium level, sed rate, rheumatoid factor and ANA.  Musculoskeletal pain treated with hydrocodone APAP.  Have drawn rheumatology studies today to see if there is a rheumatology reason for her pain and discomfort  Anxiety treated with Xanax  Situational family stress-does not want to go to counseling and does not want to be on antidepressants.  This is longstanding.  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  She has lumbar scoliosis and lower thoracic pain.  Has been seen at [facility] in August.  Complains of chronic neck and back pain.  Complains of stabbing and burning pain throughout her back as well as neck pain and headaches and bilateral arm pain.  Cannot tolerate gabapentin due to grogginess.  Tried epidural steroid injections but patient says that that did not help much.  Patient reports episodes of low pulse.  A Zio monitor has been ordered.  Plan: We will draw ANA, TSH, rheumatoid factor, magnesium, iron and iron binding capacity as well as sed rate. Zio monitor to see if she has episodic arrythymia.  Addendum: Her ANA is positive.  Results are  confusing to me.  1 titer is 1: 80 and another titer is reported is 1: 1280.  Pattern is nuclear nucleolar.  However sed rate is only 6.  Rheumatoid factor is negative.  She is being referred to [facility].

## 2022-12-12 ENCOUNTER — Ambulatory Visit
Admission: RE | Admit: 2022-12-12 | Discharge: 2022-12-12 | Disposition: A | Discharge status: Home / Self Care | Payer: 59 | Source: Ambulatory Visit | Attending: Family Medicine | Admitting: Family Medicine

## 2022-12-12 DIAGNOSIS — G35 Multiple sclerosis: Secondary | ICD-10-CM

## 2022-12-12 MED ORDER — GADOPICLENOL 0.5 MMOL/ML IV SOLN
7.0000 mL | Freq: Once | INTRAVENOUS | Status: AC | PRN
  Administered 2022-12-12: 7 mL via INTRAVENOUS

## 2022-12-13 LAB — TSH: TSH: 1.63 mIU/L (ref 0.40–4.50)

## 2022-12-13 LAB — ANTI-NUCLEAR AB-TITER (ANA TITER)
ANA TITER: 1:1280 {titer} — ABNORMAL HIGH
ANA Titer 1: 1:80 {titer} — ABNORMAL HIGH

## 2022-12-13 LAB — MAGNESIUM: Magnesium: 2.1 mg/dL (ref 1.5–2.5)

## 2022-12-13 LAB — IRON, TOTAL/TOTAL IRON BINDING CAP
%SAT: 23 % (calc) (ref 16–45)
Iron: 81 ug/dL (ref 45–160)
TIBC: 349 mcg/dL (calc) (ref 250–450)

## 2022-12-13 LAB — RHEUMATOID FACTOR: Rheumatoid fact SerPl-aCnc: <14 IU/mL (ref <14)

## 2022-12-13 LAB — SEDIMENTATION RATE: Sed Rate: 6 mm/h (ref 0–30)

## 2022-12-13 LAB — ANA: Anti Nuclear Antibody (ANA): POSITIVE — AB

## 2022-12-17 NOTE — Patient Instructions (Signed)
Referral to [facility] for abnormal Rheumatology labs.  A Zio monitor has been ordered for bradycardia.  Anxiety will be treated with Xanax as previously done.  Does not want to go to counseling for situational family stress.

## 2022-12-20 ENCOUNTER — Other Ambulatory Visit: Payer: Self-pay | Admitting: Neurology

## 2022-12-20 MED ORDER — METHYLPHENIDATE HCL 10 MG PO TABS: 20.0000 mg | ORAL_TABLET | Freq: Three times a day (TID) | ORAL | 0 refills | Status: DC

## 2022-12-20 NOTE — Telephone Encounter (Signed)
Verify Drug Registry For Methylphenidate 10 Mg Tablet Last Filled: 11/25/2022 Quantity: 180 tablets for 30 days Last appointment: 12/09/2022 Next appointment: 05/24/2023

## 2022-12-21 ENCOUNTER — Ambulatory Visit: Payer: 59 | Attending: Internal Medicine

## 2022-12-21 ENCOUNTER — Other Ambulatory Visit: Payer: Self-pay

## 2022-12-21 DIAGNOSIS — R001 Bradycardia, unspecified: Secondary | ICD-10-CM

## 2022-12-21 NOTE — Progress Notes (Unsigned)
Enrolled for [employer] to mail a ZIO XT long term holter monitor to the patients address on file.   DOD to read. 

## 2022-12-23 DIAGNOSIS — R001 Bradycardia, unspecified: Secondary | ICD-10-CM | POA: Diagnosis not present

## 2023-01-02 ENCOUNTER — Other Ambulatory Visit: Payer: Self-pay | Admitting: Neurology

## 2023-01-03 ENCOUNTER — Other Ambulatory Visit (HOSPITAL_COMMUNITY): Payer: Self-pay

## 2023-01-03 ENCOUNTER — Telehealth: Payer: Self-pay | Admitting: Family Medicine

## 2023-01-03 MED ORDER — HYDROCODONE-ACETAMINOPHEN 7.5-325 MG PO TABS
2.0000 | ORAL_TABLET | Freq: Three times a day (TID) | ORAL | 0 refills | Status: DC | PRN
  Filled 2023-01-03: qty 180, 30d supply, fill #0

## 2023-01-03 NOTE — Telephone Encounter (Signed)
  Patient is due but last saw Amy, NP and in note stated to continue, please refill if agreeable

## 2023-01-03 NOTE — Telephone Encounter (Signed)
Refill request sent routed to Dr.[name].

## 2023-01-03 NOTE — Telephone Encounter (Signed)
Pt is needing her HYDROcodone-acetaminophen ([place]) 7.5-325 MG tablet prescription sent to the [facility] due to her normal pharmacy not having it in stock.

## 2023-01-03 NOTE — Telephone Encounter (Signed)
Follow up appointment scheduled on 05/24/23 Last filled 12/06/22 # 180 tablets Rx pending for you to sign to [facility]

## 2023-01-24 ENCOUNTER — Other Ambulatory Visit: Payer: Self-pay | Admitting: Family Medicine

## 2023-01-24 ENCOUNTER — Other Ambulatory Visit (HOSPITAL_BASED_OUTPATIENT_CLINIC_OR_DEPARTMENT_OTHER): Payer: Self-pay

## 2023-01-24 MED ORDER — METHYLPHENIDATE HCL 20 MG PO TABS
20.0000 mg | ORAL_TABLET | Freq: Three times a day (TID) | ORAL | 0 refills | Status: DC
  Filled 2023-01-24: qty 90, 30d supply, fill #0

## 2023-01-24 NOTE — Telephone Encounter (Signed)
Called pt back. She is currently taking methylphenidate 45m, 2 tablets po TID. She would like 238mtab called in to take TID so she has less pills to take (dose staying the same). [facility] to [facility] She was last seen 12/07/22 and next f/u 05/24/23. Per drug registry, last refilled methylphenidate 1013m1/21/24 #180.

## 2023-01-24 NOTE — Telephone Encounter (Signed)
Pt called wanting to know if her methylphenidate (RITALIN) 10 MG tablet   can be increased to 76m  Pt states that if this is approved [facility]

## 2023-01-25 ENCOUNTER — Other Ambulatory Visit: Payer: Self-pay

## 2023-01-26 ENCOUNTER — Ambulatory Visit
Admission: RE | Admit: 2023-01-26 | Discharge: 2023-01-26 | Disposition: A | Discharge status: Home / Self Care | Payer: 59 | Source: Ambulatory Visit | Attending: Internal Medicine | Admitting: Internal Medicine

## 2023-01-30 ENCOUNTER — Encounter (HOSPITAL_BASED_OUTPATIENT_CLINIC_OR_DEPARTMENT_OTHER): Payer: Self-pay | Admitting: Pharmacist

## 2023-01-30 ENCOUNTER — Other Ambulatory Visit: Payer: Self-pay | Admitting: Neurology

## 2023-01-30 ENCOUNTER — Other Ambulatory Visit (HOSPITAL_BASED_OUTPATIENT_CLINIC_OR_DEPARTMENT_OTHER): Payer: Self-pay

## 2023-01-30 MED ORDER — HYDROCODONE-ACETAMINOPHEN 7.5-325 MG PO TABS
2.0000 | ORAL_TABLET | Freq: Three times a day (TID) | ORAL | 0 refills | Status: DC | PRN
  Filled 2023-01-30 – 2023-01-31 (×3): qty 180, 30d supply, fill #0

## 2023-01-31 ENCOUNTER — Other Ambulatory Visit: Payer: Self-pay

## 2023-01-31 ENCOUNTER — Other Ambulatory Visit (HOSPITAL_BASED_OUTPATIENT_CLINIC_OR_DEPARTMENT_OTHER): Payer: Self-pay

## 2023-02-16 ENCOUNTER — Ambulatory Visit (HOSPITAL_BASED_OUTPATIENT_CLINIC_OR_DEPARTMENT_OTHER): Payer: 59 | Admitting: Cardiology

## 2023-02-21 ENCOUNTER — Other Ambulatory Visit: Payer: Self-pay

## 2023-02-21 ENCOUNTER — Other Ambulatory Visit (HOSPITAL_BASED_OUTPATIENT_CLINIC_OR_DEPARTMENT_OTHER): Payer: Self-pay

## 2023-02-21 ENCOUNTER — Other Ambulatory Visit: Payer: Self-pay | Admitting: Neurology

## 2023-02-22 ENCOUNTER — Other Ambulatory Visit (HOSPITAL_BASED_OUTPATIENT_CLINIC_OR_DEPARTMENT_OTHER): Payer: Self-pay

## 2023-02-22 MED ORDER — HYDROCODONE-ACETAMINOPHEN 7.5-325 MG PO TABS
2.0000 | ORAL_TABLET | Freq: Three times a day (TID) | ORAL | 0 refills | Status: DC | PRN
  Filled 2023-02-22 – 2023-02-28 (×2): qty 180, 30d supply, fill #0

## 2023-02-23 ENCOUNTER — Other Ambulatory Visit: Payer: Self-pay | Admitting: Family Medicine

## 2023-02-23 ENCOUNTER — Other Ambulatory Visit (HOSPITAL_BASED_OUTPATIENT_CLINIC_OR_DEPARTMENT_OTHER): Payer: Self-pay

## 2023-02-23 MED ORDER — METHYLPHENIDATE HCL 20 MG PO TABS
20.0000 mg | ORAL_TABLET | Freq: Three times a day (TID) | ORAL | 0 refills | Status: DC
  Filled 2023-02-23: qty 90, 30d supply, fill #0

## 2023-02-24 ENCOUNTER — Other Ambulatory Visit (HOSPITAL_BASED_OUTPATIENT_CLINIC_OR_DEPARTMENT_OTHER): Payer: Self-pay

## 2023-02-28 ENCOUNTER — Other Ambulatory Visit (HOSPITAL_BASED_OUTPATIENT_CLINIC_OR_DEPARTMENT_OTHER): Payer: Self-pay

## 2023-03-16 ENCOUNTER — Telehealth: Payer: Self-pay | Admitting: Family Medicine

## 2023-03-16 MED ORDER — HYOSCYAMINE SULFATE 0.125 MG SL SUBL: 0.1250 mg | SUBLINGUAL_TABLET | SUBLINGUAL | 0 refills | Status: AC | PRN

## 2023-03-16 NOTE — Telephone Encounter (Signed)
Last seen on 12/08/22 Follow up scheduled on 05/24/23 Last prescribed 11/16/2020 #270 tablet with 3 refills.   Rx sent.

## 2023-03-16 NOTE — Telephone Encounter (Signed)
Pt is requesting a refill for hyoscyamine (LEVSIN SL) 0.125 MG SL tablet  .  Pharm[facility]: [facility] [phone]

## 2023-03-19 ENCOUNTER — Other Ambulatory Visit: Payer: Self-pay | Admitting: Neurology

## 2023-03-20 ENCOUNTER — Other Ambulatory Visit (HOSPITAL_BASED_OUTPATIENT_CLINIC_OR_DEPARTMENT_OTHER): Payer: Self-pay

## 2023-03-21 ENCOUNTER — Other Ambulatory Visit (HOSPITAL_BASED_OUTPATIENT_CLINIC_OR_DEPARTMENT_OTHER): Payer: Self-pay

## 2023-03-22 ENCOUNTER — Other Ambulatory Visit: Payer: Self-pay

## 2023-03-27 ENCOUNTER — Other Ambulatory Visit: Payer: Self-pay | Admitting: Family Medicine

## 2023-03-27 ENCOUNTER — Other Ambulatory Visit (HOSPITAL_COMMUNITY): Payer: Self-pay

## 2023-03-27 ENCOUNTER — Other Ambulatory Visit (HOSPITAL_BASED_OUTPATIENT_CLINIC_OR_DEPARTMENT_OTHER): Payer: Self-pay

## 2023-03-27 ENCOUNTER — Telehealth: Payer: Self-pay | Admitting: Family Medicine

## 2023-03-27 MED ORDER — METHYLPHENIDATE HCL 20 MG PO TABS
20.0000 mg | ORAL_TABLET | Freq: Three times a day (TID) | ORAL | 0 refills | Status: DC
  Filled 2023-03-27 – 2023-03-28 (×2): qty 90, 30d supply, fill #0

## 2023-03-27 MED ORDER — HYDROCODONE-ACETAMINOPHEN 7.5-325 MG PO TABS: 2.0000 | ORAL_TABLET | Freq: Three times a day (TID) | ORAL | 0 refills | Status: DC | PRN

## 2023-03-27 NOTE — Telephone Encounter (Addendum)
Pt requesting refill for HYDROcodone-acetaminophen (NORCO) 7.5-325 MG tablet. [phone]. Pt stated as of now they have medication in stock.  Pt also requesting a refill on  methylphenidate (RITALIN) 20 MG tablet should be sent to [facility]

## 2023-03-27 NOTE — Telephone Encounter (Signed)
Pt last seen 12/07/22 and next f/u 05/24/23. Per drug registry, last refilled hydrocodone 02/28/23 #180.  Last refilled methylphenidate 02/24/23 #90.

## 2023-03-27 NOTE — Telephone Encounter (Signed)
error 

## 2023-03-28 ENCOUNTER — Other Ambulatory Visit (HOSPITAL_BASED_OUTPATIENT_CLINIC_OR_DEPARTMENT_OTHER): Payer: Self-pay

## 2023-04-01 ENCOUNTER — Other Ambulatory Visit (HOSPITAL_BASED_OUTPATIENT_CLINIC_OR_DEPARTMENT_OTHER): Payer: Self-pay

## 2023-04-10 ENCOUNTER — Encounter (HOSPITAL_BASED_OUTPATIENT_CLINIC_OR_DEPARTMENT_OTHER): Payer: Self-pay

## 2023-04-10 ENCOUNTER — Ambulatory Visit (HOSPITAL_BASED_OUTPATIENT_CLINIC_OR_DEPARTMENT_OTHER): Payer: 59 | Admitting: Cardiology

## 2023-04-10 ENCOUNTER — Telehealth (HOSPITAL_BASED_OUTPATIENT_CLINIC_OR_DEPARTMENT_OTHER): Payer: Self-pay | Admitting: Cardiology

## 2023-04-10 NOTE — Telephone Encounter (Signed)
Called to discuss rescheduling the 04/10/23 2:40 pm New Patient appointment with Dr Ronelle Nigh is ill  ( no answer and no voice mail ) kf

## 2023-04-19 ENCOUNTER — Other Ambulatory Visit: Payer: Self-pay | Admitting: Neurology

## 2023-04-20 MED ORDER — HYDROCODONE-ACETAMINOPHEN 7.5-325 MG PO TABS: 2.0000 | ORAL_TABLET | Freq: Three times a day (TID) | ORAL | 0 refills | Status: DC | PRN

## 2023-04-20 NOTE — Telephone Encounter (Signed)
Last seen on 12/07/22 per note "Pain controlled on hydrocodone 7.5-325mg  two tablets every 8 hours as needed. "  Follow up scheduled on 05/24/23  Last filled on 03/27/23 #180 tablets( 30 day supply)   Rx pending to be signed

## 2023-04-21 ENCOUNTER — Other Ambulatory Visit: Payer: Self-pay | Admitting: Neurology

## 2023-04-24 ENCOUNTER — Other Ambulatory Visit: Payer: Self-pay | Admitting: Neurology

## 2023-04-24 ENCOUNTER — Other Ambulatory Visit (HOSPITAL_BASED_OUTPATIENT_CLINIC_OR_DEPARTMENT_OTHER): Payer: Self-pay

## 2023-04-24 NOTE — Telephone Encounter (Signed)
Last seen on 12/07/22 per note "She continues Ritalin 20mg  TID "  Follow up scheduled on 05/24/23  Last filled on 04/01/23 #90 tablets (30 day supply)  Rx pending to be signed

## 2023-04-25 ENCOUNTER — Other Ambulatory Visit (HOSPITAL_BASED_OUTPATIENT_CLINIC_OR_DEPARTMENT_OTHER): Payer: Self-pay

## 2023-04-25 MED ORDER — METHYLPHENIDATE HCL 20 MG PO TABS
20.0000 mg | ORAL_TABLET | Freq: Three times a day (TID) | ORAL | 0 refills | Status: DC
  Filled 2023-04-25: qty 90, 30d supply, fill #0

## 2023-04-25 NOTE — Telephone Encounter (Signed)
Pt last seen on 12/07/22 Follow up scheduled on 05/24/23 Last filled on 04/01/23 #90 tablet (30 day supply) Rx pending to be signed

## 2023-05-22 ENCOUNTER — Other Ambulatory Visit (HOSPITAL_BASED_OUTPATIENT_CLINIC_OR_DEPARTMENT_OTHER): Payer: Self-pay

## 2023-05-22 ENCOUNTER — Other Ambulatory Visit: Payer: Self-pay | Admitting: Neurology

## 2023-05-22 MED ORDER — METHYLPHENIDATE HCL 20 MG PO TABS
20.0000 mg | ORAL_TABLET | Freq: Three times a day (TID) | ORAL | 0 refills | Status: DC
  Filled 2023-05-26: qty 90, 30d supply, fill #0

## 2023-05-22 NOTE — Telephone Encounter (Signed)
Last seen on 12/07/22 Follow up scheduled on 05/24/23 Last filled on 04/25/23 #90 tablets (30 day supply) Rx pending to be signed

## 2023-05-23 ENCOUNTER — Other Ambulatory Visit (HOSPITAL_BASED_OUTPATIENT_CLINIC_OR_DEPARTMENT_OTHER): Payer: Self-pay

## 2023-05-23 ENCOUNTER — Telehealth: Payer: Self-pay | Admitting: Family Medicine

## 2023-05-23 ENCOUNTER — Other Ambulatory Visit: Payer: Self-pay | Admitting: Neurology

## 2023-05-23 MED ORDER — HYDROCODONE-ACETAMINOPHEN 7.5-325 MG PO TABS
2.0000 | ORAL_TABLET | Freq: Three times a day (TID) | ORAL | 0 refills | Status: DC | PRN
  Filled 2023-05-25: qty 180, 30d supply, fill #0

## 2023-05-23 NOTE — Telephone Encounter (Signed)
Phone room: please call pt back. She has appt with Dr. Epimenio Foot tomorrow at 4pm. She can discuss with him at appt tomorrow.   Pt last seen 12/07/22 and next f/u with Dr. Epimenio Foot tomorrow (05/24/23).  Last refilled hydrocodone 04/24/23 #180 Last refilled methylphenidate 04/25/23 #90.

## 2023-05-23 NOTE — Telephone Encounter (Signed)
Last seen on 12/07/22 Follow up scheduled on 05/24/23 Last filled on 04/24/23 #180 tablets (30 day supply) Rx pending to be signed

## 2023-05-23 NOTE — Telephone Encounter (Signed)
Pt called needing to discuss her HYDROcodone-acetaminophen (NORCO) 7.5-325 MG tablet and her methylphenidate (RITALIN) 20 MG tablet and the refills she received last month. Please advise.

## 2023-05-23 NOTE — Telephone Encounter (Signed)
Noted  

## 2023-05-24 ENCOUNTER — Telehealth: Payer: Self-pay | Admitting: Neurology

## 2023-05-24 ENCOUNTER — Ambulatory Visit: Payer: 59 | Admitting: Neurology

## 2023-05-24 ENCOUNTER — Encounter: Payer: Self-pay | Admitting: Neurology

## 2023-05-24 VITALS — BP 145/79 | HR 68 | Ht 65.0 in | Wt 157.6 lb

## 2023-05-24 DIAGNOSIS — Z79899 Other long term (current) drug therapy: Secondary | ICD-10-CM | POA: Diagnosis not present

## 2023-05-24 DIAGNOSIS — F988 Other specified behavioral and emotional disorders with onset usually occurring in childhood and adolescence: Secondary | ICD-10-CM

## 2023-05-24 DIAGNOSIS — G35 Multiple sclerosis: Secondary | ICD-10-CM | POA: Diagnosis not present

## 2023-05-24 DIAGNOSIS — R269 Unspecified abnormalities of gait and mobility: Secondary | ICD-10-CM

## 2023-05-24 DIAGNOSIS — Z79891 Long term (current) use of opiate analgesic: Secondary | ICD-10-CM | POA: Diagnosis not present

## 2023-05-24 DIAGNOSIS — G8929 Other chronic pain: Secondary | ICD-10-CM

## 2023-05-24 DIAGNOSIS — M545 Low back pain, unspecified: Secondary | ICD-10-CM

## 2023-05-24 DIAGNOSIS — N3941 Urge incontinence: Secondary | ICD-10-CM

## 2023-05-24 NOTE — Progress Notes (Signed)
[facility]  PATIENT: [name] DOB: 04-23-1961 REASON FOR VISIT: Multiple sclerosis   HISTORICAL  CHIEF COMPLAINT:  Chief Complaint  Patient presents with   Follow-up    Pt in room 10. Here for MS follow up. Pt said she has good and bad days. Uses walker when out placed, at home she doesn't need. No recent falls. Pt reports leg weakness.     HISTORY OF PRESENT ILLNESS:  [name] is a 62 y.o. woman with relapsing remitting multiple sclerosis diagnosed in 2008.    Update 05/24/2023: She stopped the Tecfidera after her imaging in February 2023 and inotes no new lesions  No exacerbations.   She still has a lot of symptoms  . She feels her MS is stable.     She was evaluated by rheumatology due to more knee, spine and hip pain.  She was told she does not have RA and was told she has fibromyalgia.    Her gait has continued to worsen ad she uses the walker more.    Her right leg is weaker than left and she has a foot drop at times.  She tires out very easily.   She wakes up to urinate 3-4 times a night which is better.    She wears a Poise pad due to occasional incontinence..    She usually falls back asleep easily.   She denies snoring.   She feels less depressed off any antidepressant.   She rarely takes a single xanax 0.25 mg at night for bad insomnia but has a hangover the next day.  She has had more fatigue. Brand name Ritalin helps her ADD the most but is not covered.    She also tried Adderall 20 mg po tid and felt it may have worked a little better than methylphenidate but caused HA.     She continues to experience pain in her back and spasms in back and legs, and knee pain, helped by hydrocodone (she takes 6 x 7.5 mg daily).  Gabapentin (even 300 mg) causes sleepiness and was not tolerated.   She is compliant (PDMP has been checked).  She has not shown any drug-seeking behavior.  Hhdrocodone has helped her pain the most.   When pain flares, she feels her  walking does worse.     Sometimes has neck pain and occipital pain.    Thoracic pain has been worse recently.      Vit D 50000 units causes heart racing but lower dose daily is tolerated.       Vit B12 was also low in the past.        MS History:  In 2008, she had an MRI of the brain performed which was consistent with multiple sclerosis and she was diagnosed with relapsing remitting MS. He was initially placed on Avonex for therapy. She had some exacerbations with weakness and clumsiness with her gait and she started to see Dr. Leotis Shames. He placed her on Tysabri she is on Tysabri between 2010 and 2013. 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   Imaging Review: 12/13/17 MRI of the cervical and thoracic spine:  She has multiple T2 hyperintense lesions in the spinal cord the largest to the right C2-C3 and smaller ones at C3-C4, C4, C5-C6, C6-C7 and T1-T2 all the foci were present compared to the 2016 MRI.  The MRI of the thoracic spine showed small foci adjacent to T2 and T5-6.  None of the spinal plaques appeared to be acute.    MRI 02/25/2020 brain:   Multiple T2/FLAIR hyperintense foci in the hemispheres in a pattern and configuration consistent with chronic demyelinating plaque associated with multiple sclerosis.  None of the foci appears to be acute.  Compared to the MRI from 03/18/2017, there is no interval change.   MRI 2016, 2017 and 2018 showed no new lesions  MRI of the brain 05/19/2022 showed no new lesions.  MRI of the thoracic spine 05/29/2022 showed no new lesions.  MRI of the lumbar spine 05/29/2022 showed At L3-L4, there is mild to moderate spinal stenosis due to disc protrusion and other degenerative change.  Additionally there is moderate right foraminal narrowing and moderate bilateral lateral recess stenosis.  There does not appear to be nerve root compression.   Milder degenerative changes at  other lumbar levels not causing spinal stenosis or nerve root compression.  REVIEW OF SYSTEMS:  Constitutional: No fevers, chills, sweats, or change in appetite.  She notes fatigue and attention deficit Eyes: No visual changes,eye pain.   She has intermittent diplopia Ear, nose and throat: No hearing loss, ear pain, nasal congestion, sore throat Cardiovascular: No chest pain, palpitations Respiratory:  No shortness of breath at rest or with exertion.   No wheezes GastrointestinaI: No nausea, vomiting, diarrhea, abdominal pain, fecal incontinence Genitourinary:  a sabove Musculoskeletal:  she has both neck pain and back pain. Also bilat knee pain Integumentary: No rash, pruritus, skin lesions.   She has Raynauds in her hands  Neurological: as above Psychiatric: No depression at this time.  Anxiety doing ok on med's Endocrine: No palpitations, diaphoresis, change in appetite, change in weigh or increased thirst Hematologic/Lymphatic:  No anemia, purpura, petechiae. Allergic/Immunologic: No itchy/runny eyes, nasal congestion, recent allergic reactions, rashes  ALLERGIES: Allergies  Allergen Reactions   Ibuprofen Other (See Comments)    CAUSES LYMPHEDEMA   Nsaids Other (See Comments)    AVOIDS ANY SODIUM-CONTAINING MED. -- CAUSES LYMPHEDEMA   Other Swelling and Other (See Comments)    Laxatives and artificial sweeteners- cause leg swelling   Vitamin D Analogs Palpitations and Other (See Comments)    Cannot tolerate at 50,000 units OR even 2,000 units    HOME MEDICATIONS: Outpatient Medications Prior to Visit  Medication Sig Dispense Refill   ALPRAZolam (XANAX) 0.25 MG tablet Take 1 tablet (0.25 mg total) by mouth at bedtime as needed for anxiety. 90 tablet 1   cyanocobalamin (VITAMIN B12) 1000 MCG/ML injection INJECT 1 ML TWICE PER MONTH 2 mL 11   [START ON 05/25/2023] HYDROcodone-acetaminophen (NORCO) 7.5-325 MG tablet Take 2 tablets by mouth every 8 (eight) hours as needed. 180  tablet 0   hyoscyamine (LEVSIN SL) 0.125 MG SL tablet Place 1 tablet (0.125 mg total) under the tongue every 4 (four) hours as needed. 270 tablet 0   [START ON 05/26/2023] methylphenidate (RITALIN) 20 MG tablet Take 1 tablet (20 mg total) by mouth 3 (three) times daily with meals. 90 tablet 0   metoprolol tartrate (LOPRESSOR) 50 MG tablet TAKE 1/2 TABLET BY MOUTH EVERY 12 HOURS AS NEEDED FOR PALPITATIONS 30 tablet 5   Syringe/Needle, Disp, (SYRINGE 3CC/23GX1") 23G X 1" 3 ML MISC 3 Syringes by Does not apply route every 30 (thirty) days. (Patient taking differently: 3 Syringes by Does not apply route every 30 (thirty) days. 26Gx5/6") 3 each 3   No facility-administered  medications prior to visit.    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   Headache(784.0)    History of concussion YOUNG ADULT--  NO RESIDUAL   IBS (irritable bowel syndrome)    Movement disorder    MS (multiple sclerosis) (HCC)    MVP (mitral valve prolapse) HX HEART RACING  YRS AGO   ASYMPTOMATIC SINCE THEN   Vision abnormalities    Weakness of right leg SECONDARY TO MS    PAST SURGICAL HISTORY: Past Surgical History:  Procedure Laterality Date   CESAREAN SECTION  X3   BILATERAL TUBAL LIGATION W/ LAST ONE   DESTRUCTION OF ANAL CONDYLOMA  07-07-2005  DR TOTH   HERNIA REPAIR     KNEE ARTHROSCOPY WITH LATERAL MENISECTOMY  10/26/2012   Procedure: KNEE ARTHROSCOPY WITH LATERAL MENISECTOMY;  Surgeon: Javier Docker, MD;  Location: [facility];  Service: Orthopedics;  Laterality: Left;  Left Knee Arthrsocopy with Partial Lateral Menisectomy   LAPAROSCOPIC ASSISTED VAGINAL HYSTERECTOMY  01-15-2002  DR Gerlene Burdock HOLLAND/ DR TIM DAVIS   AND REPAIR VENTRAL HERNIA    LASER ABLATION CONDOLAMATA N/A 04/26/2013   Procedure: LASER ABLATION CONDOLAMATA;  Surgeon: Romie Levee, MD;  Location: [facility];  Service: General;  Laterality: N/A;   TONSILLECTOMY     TOTAL KNEE ARTHROPLASTY Left 11/05/2015   Procedure: LEFT TOTAL KNEE ARTHROPLASTY;  Surgeon: Jene Every, MD;  Location: [facility];  Service: Orthopedics;  Laterality: Left;    FAMILY HISTORY: Family History  Problem Relation Age of Onset   Diabetes Mother    Hypertension Mother    Stroke Mother    Bowel Disease Brother    Diabetes Brother    Healthy Brother    Emphysema Father    Heart attack Father     SOCIAL HISTORY:  Social History   Socioeconomic History   Marital status: Married    Spouse name: Not on file   Number of children: 3   Years of education: Not on file   Highest education level: Not on file  Occupational History   Not on file  Tobacco Use   Smoking status: Never   Smokeless tobacco: Never  Substance and Sexual Activity   Alcohol use: No   Drug use: No   Sexual activity: Not on file  Other Topics Concern   Not on file  Social History Narrative   Not on file   Social Determinants of Health   Financial Resource Strain: Not on file  Food Insecurity: Not on file  Transportation Needs: Not on file  Physical Activity: Not on file  Stress: Not on file  Social Connections: Not on file  Intimate Partner Violence: Not on file     PHYSICAL EXAM  Vitals:   05/24/23 1556  BP: (!) 145/79  Pulse: 68  Weight: 157 lb 9.6 oz (71.5 kg)  Height: 5\' 5"  (1.651 m)    Body mass index is 26.23 kg/m.   General: The patient is well-developed and well-nourished and in no acute distress.  Some back tenderness.  She has no rashes or edema.   Neurologic Exam  Mental status: The patient  is alert and oriented x 3 at the time of the examination. The patient has apparent normal recent and remote memory, with an apparently normal attention span and concentration  ability.   Speech is normal.  Cranial nerves: The extraocular muscles are intact.  Normal facial strength.    Voice is clear.  Hearing appears normal.  Motor:  Muscle bulk is normal. Tone is mildly increased in her legs.. Strength is 5/5  Coordination: Cerebellar testing shows mild right reduced heel to shin.  Finger to nose was normal today  Gait and station: Station is normal. Gait is wide.  Tandem gait is poor.   .   The Romberg is borderline.  Reflexes: Deep tendon reflexes are symmetric and brisk bilaterally with crossed abductors at the knees, worse on right. No ankle clonus.Marland Kitchen        DIAGNOSTIC DATA (LABS, IMAGING, TESTING) - I reviewed patient records, labs, notes, testing and imaging myself where available.  Lab Results  Component Value Date   WBC 4.2 12/07/2022   HGB 15.2 12/07/2022   HCT 46.0 12/07/2022   MCV 90 12/07/2022   PLT 264 12/07/2022      Component Value Date/Time   NA 140 12/07/2022 1349   K 4.4 12/07/2022 1349   CL 100 12/07/2022 1349   CO2 26 12/07/2022 1349   GLUCOSE 94 12/07/2022 1349   GLUCOSE 88 08/17/2022 1234   BUN 14 12/07/2022 1349   CREATININE 0.95 12/07/2022 1349   CREATININE 0.84 07/08/2022 0930   CALCIUM 9.4 12/07/2022 1349   PROT 7.0 12/07/2022 1349   ALBUMIN 4.4 12/07/2022 1349   AST 26 12/07/2022 1349   ALT 26 12/07/2022 1349   ALKPHOS 109 12/07/2022 1349   BILITOT 0.6 12/07/2022 1349   GFRNONAA >60 08/17/2022 1234   GFRNONAA 86 09/15/2020 1120   GFRAA 100 09/15/2020 1120   Lab Results  Component Value Date   CHOL 184 07/08/2022   HDL 89 07/08/2022   LDLCALC 80 07/08/2022   TRIG 71 07/08/2022   CHOLHDL 2.1 07/08/2022   Lab Results  Component Value Date   HGBA1C  03/10/2010    5.5 (NOTE) The ADA recommends the following therapeutic goal for glycemic control related to Hgb A1c measurement: Goal of therapy: <6.5 Hgb A1c  Reference: American Diabetes Association: Clinical Practice Recommendations 2010, Diabetes Care, 2010,  33: (Suppl  1).      ASSESSMENT AND PLAN    1. Multiple sclerosis (HCC)   2. High risk medication use   3. Chronic prescription opiate use   4. Attention deficit disorder, unspecified hyperactivity presence   5. Chronic midline low back pain without sciatica   6. Urge incontinence   7. Gait disturbance      1.  She is not on a DMT (last on Tecfidera).   MRI 05/2022 was stable. Check MRI of the brain c/s to see if progression and restart a DMT if occurring (we discussed Tecfidera and Mavenclad) 2.  Continue methylphenidate for attention deficit and fatigue.  She feels the brand name is much more effective than the generic but insurance no longer covers it. 3.   She continues to experience back and right greater than left leg pain.  Some of the pain is likely coming from her back where she has multilevel degenerative changes.  She had borderline spinal stenosis at L3-L4 and moderate foraminal narrowing at a couple of the levels.  She also has scoliosis.  I discussed with her that if she should  continue to try to exercise and I be happy to refer her to physical therapy as well.  If she feels she is getting no benefit from the medications and exercises then I could have her see a Careers adviser for an opinion.  If surgery was done, I would think she would need to have a fusion operation which would entail a longer recovery.  Additionally because she has multilevel degenerative changes, multilevel procedure would be needed and does not guarantee that her pain would be better  4.  continue hydrocodone for spine and spasm pain.  The [place] controlled substance database was reviewed and she has been compliant.  She has not shown any drug-seeking behavior.  We discussed not taking more than the prescribed dose due to increased risk.      Will check UDS today 4.  Follow-up in 4 months or sooner if there are new or worsening neurologic symptoms.   43-minute office visit with the majority of the time  spent face-to-face for history and physical, discussion/counseling and decision-making.  Additional time with record review and documentation.   Zimal Weisensel A. Epimenio Foot, MD, PhD 05/24/2023, 4:24 PM Certified in Neurology, Clinical Neurophysiology, Sleep Medicine, Pain Medicine and Neuroimaging  [facility] [address] [place], [place] [address] [phone] ]

## 2023-05-24 NOTE — Telephone Encounter (Signed)
[facility] NPR case #[id number] sent to [facility] [phone]

## 2023-05-25 ENCOUNTER — Other Ambulatory Visit (HOSPITAL_BASED_OUTPATIENT_CLINIC_OR_DEPARTMENT_OTHER): Payer: Self-pay

## 2023-05-25 LAB — DRUG SCREEN, URINE
Amphetamines, Urine: NEGATIVE ng/mL
Barbiturate screen, urine: NEGATIVE ng/mL
Benzodiazepine Quant, Ur: NEGATIVE ng/mL
Cannabinoid Quant, Ur: NEGATIVE ng/mL
Cocaine (Metab.): NEGATIVE ng/mL
Opiate Quant, Ur: POSITIVE ng/mL — AB
PCP Quant, Ur: NEGATIVE ng/mL

## 2023-05-26 ENCOUNTER — Other Ambulatory Visit (HOSPITAL_BASED_OUTPATIENT_CLINIC_OR_DEPARTMENT_OTHER): Payer: Self-pay

## 2023-05-26 ENCOUNTER — Other Ambulatory Visit: Payer: Self-pay

## 2023-06-03 ENCOUNTER — Ambulatory Visit
Admission: RE | Admit: 2023-06-03 | Discharge: 2023-06-03 | Disposition: A | Discharge status: Home / Self Care | Payer: 59 | Source: Ambulatory Visit | Attending: Neurology | Admitting: Neurology

## 2023-06-03 DIAGNOSIS — R269 Unspecified abnormalities of gait and mobility: Secondary | ICD-10-CM | POA: Diagnosis not present

## 2023-06-03 DIAGNOSIS — G35 Multiple sclerosis: Secondary | ICD-10-CM | POA: Diagnosis not present

## 2023-06-03 MED ORDER — GADOPICLENOL 0.5 MMOL/ML IV SOLN
7.0000 mL | Freq: Once | INTRAVENOUS | Status: AC | PRN
  Administered 2023-06-03: 7 mL via INTRAVENOUS

## 2023-06-21 ENCOUNTER — Other Ambulatory Visit: Payer: Self-pay | Admitting: Neurology

## 2023-06-21 ENCOUNTER — Other Ambulatory Visit (HOSPITAL_BASED_OUTPATIENT_CLINIC_OR_DEPARTMENT_OTHER): Payer: Self-pay

## 2023-06-21 MED ORDER — METHYLPHENIDATE HCL 20 MG PO TABS
20.0000 mg | ORAL_TABLET | Freq: Three times a day (TID) | ORAL | 0 refills | Status: DC
  Filled 2023-06-21: qty 90, 30d supply, fill #0
  Filled 2023-06-22: qty 80, 27d supply, fill #0
  Filled 2023-06-22: qty 10, 3d supply, fill #0

## 2023-06-21 MED ORDER — HYDROCODONE-ACETAMINOPHEN 7.5-325 MG PO TABS
2.0000 | ORAL_TABLET | Freq: Three times a day (TID) | ORAL | 0 refills | Status: DC | PRN
  Filled 2023-06-21: qty 180, 30d supply, fill #0

## 2023-06-21 NOTE — Telephone Encounter (Signed)
Last seen on 05/24/23 Follow up scheduled on 10/23/23 Ritalin 20 mg tablets last filled on 05/26/23 #90 tablets (30 day supply) Hydrocodone 7.5/325 mg tablet last filled on 05/25/23 #180 tablets (30 day supply) Rx's pending to be signed

## 2023-06-22 ENCOUNTER — Other Ambulatory Visit: Payer: Self-pay

## 2023-06-22 ENCOUNTER — Other Ambulatory Visit (HOSPITAL_BASED_OUTPATIENT_CLINIC_OR_DEPARTMENT_OTHER): Payer: Self-pay

## 2023-07-03 ENCOUNTER — Encounter (HOSPITAL_BASED_OUTPATIENT_CLINIC_OR_DEPARTMENT_OTHER): Payer: Self-pay | Admitting: Cardiology

## 2023-07-03 ENCOUNTER — Ambulatory Visit (HOSPITAL_BASED_OUTPATIENT_CLINIC_OR_DEPARTMENT_OTHER): Payer: 59 | Admitting: Cardiology

## 2023-07-03 VITALS — BP 142/86 | HR 75 | Ht 65.0 in | Wt 154.2 lb

## 2023-07-03 DIAGNOSIS — Q239 Congenital malformation of aortic and mitral valves, unspecified: Secondary | ICD-10-CM

## 2023-07-03 DIAGNOSIS — R001 Bradycardia, unspecified: Secondary | ICD-10-CM | POA: Diagnosis not present

## 2023-07-03 DIAGNOSIS — R002 Palpitations: Secondary | ICD-10-CM | POA: Diagnosis not present

## 2023-07-03 DIAGNOSIS — Z712 Person consulting for explanation of examination or test findings: Secondary | ICD-10-CM | POA: Diagnosis not present

## 2023-07-03 NOTE — Progress Notes (Signed)
Cardiology Office Note:  .    Date:  07/03/2023  ID:  [name], DOB 04-May-1961, MRN [id number] PCP: [name], MD  [facility] Providers Cardiologist:  Lynn Red, MD     History of Present Illness: .    [name] is a 62 y.o. female with a hx of atrial fibrillation, mitral valve prolapse, multiple sclerosis, vitamin D deficiency, here for the evaluation of palpitations.  She was seen by Dr. Lenord Fellers 12/09/2022 and reported bradycardic episodes, HR 45-50 bpm. Had metoprolol to take as needed for palpitations, which weren't very often. She wore a Zio monitor revealing 1 run of VT lasting 4 beats, and 2 runs of SVT, longest lasting 10 beats. Isolated SVEs were rare (<1.0%), SVE Couplets were rare (<1.0%), and SVE Triplets were rare (<1.0%). Isolated VEs were rare (<1.0%, 81), VE Triplets were rare (<1.0%, 1), and no VE Couplets were present.   Previously seen by Dr. Jens Som in 2016. She had atrial fibrillation in 06/2014 and recurrent episode 12/2014. She had an echo 12/2014 with normal LVEF and mild left atrial enlargement. Nuclear stress test 02/2015 showed LVEF 55% and normal perfusion. Continued metoprolol with plans for addition of antiarrhythmic if her symptoms worsened. Recommended aspirin but she declined. Would not treat with coumadin or NOAC.   Today, she is accompanied by her husband. She believes that her symptoms beginning 11/2022 were related to a possible flare-up of MS. At one point there was a shortage of hydrocodone, and she had eventually received blister packs from the [facility]. She felt she may have been oversedated causing some of her symptoms. At first she couldn't get out of bed, heart rate was very slow. She notes that she has known triggers, including consuming certain foods, high doses of vitamin D, and stress. Her palpitations are sometimes nothing more than her random MS flares. Taking metoprolol causes her to feel very fatigued. She  doesn't need to take the metoprolol often. No passing out or blacking out. Only has abdominal and LE fluid retention secondary to digestive issues.  Cardiovascular risk factors: Prior clinical ASCVD: None Comorbid conditions:  Hypertension - In the office her blood pressure is 150/84 initially, and 142/86 on manual recheck. Home blood pressures are usually lower than this. Has been stressed at home recently with her children and grandchildren. 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 Vitamin B12 deficiency - on vitamin B12 injections which she tolerates. Metabolic syndrome/Obesity:  Current weight 154 lbs. Chronic inflammatory conditions:  Multiple sclerosis. Tobacco use history:  Never. Prior pertinent testing and/or incidental findings: Zio monitor 01/2023, echo and nuclear myoview as above. She avoids medications with sucralose/lactose or yellow dyes. Exercise level: Lately she states she is a little too active. Has times that she is intolerant of heat, and other times that she is able to tolerate heat and use the push mower to mow the lawn. Current diet: Typically drinks unsweetened tea and plenty of water. All fresh foods. Does better if she avoids meat, no fried foods, no soft drinks, sodium, MSG, artificial colors and dyes, artificial sugars.  She denies any chest pain, shortness of breath, lightheadedness, headaches, syncope, orthopnea, or PND.  ROS:  Please see the history of present illness. ROS otherwise negative except as noted.  (+) Fatigue/Malaise (+) Migraines (+) Palpitations  Studies Reviewed: Marland Kitchen    EKG Interpretation Date/Time:  Monday July 03 2023 14:57:58 EDT Ventricular Rate:  75 PR Interval:  138 QRS Duration:  80  QT Interval:  416 QTC Calculation: 464 R Axis:   66  Text Interpretation: Normal sinus rhythm Confirmed by [name] [phone]) on 07/03/2023 3:44:33 PM    Cardiac Monitor  01/2023: Patch Wear Time:  6 days and 11 hours (2024-01-19T22:00:32-0500  to 2024-01-26T09:31:52-0500)   Patient had a min HR of 44 bpm, max HR of 207 bpm, and avg HR of 66 bpm. Predominant underlying rhythm was Sinus Rhythm. 1 run of Ventricular Tachycardia occurred lasting 4 beats with a max rate of 207 bpm (avg 187 bpm). 2 Supraventricular Tachycardia  runs occurred, the run with the fastest interval lasting 5 beats with a max rate of 119 bpm, the longest lasting 10 beats with an avg rate of 98 bpm. Isolated SVEs were rare (<1.0%), SVE Couplets were rare (<1.0%), and SVE Triplets were rare (<1.0%).  Isolated VEs were rare (<1.0%, 81), VE Triplets were rare (<1.0%, 1), and no VE Couplets were present.  Physical Exam:    VS:  BP (!) 142/86 (BP Location: Right Arm, Patient Position: Sitting, Cuff Size: Normal)   Pulse 75   Ht 5\' 5"  (1.651 m)   Wt 154 lb 3.2 oz (69.9 kg)   BMI 25.66 kg/m    Wt Readings from Last 3 Encounters:  07/03/23 154 lb 3.2 oz (69.9 kg)  05/24/23 157 lb 9.6 oz (71.5 kg)  12/09/22 154 lb 6.4 oz (70 kg)    GEN: Well nourished, well developed in no acute distress HEENT: Normal, moist mucous membranes NECK: No JVD CARDIAC: regular rhythm, normal S1 and S2, no rubs or gallops. No murmur. VASCULAR: Radial and DP pulses 2+ bilaterally. No carotid bruits RESPIRATORY:  Clear to auscultation without rales, wheezing or rhonchi  ABDOMEN: Soft, non-tender, non-distended MUSCULOSKELETAL:  Ambulates independently SKIN: Warm and dry, no edema NEUROLOGIC:  Alert and oriented x 3. No focal neuro deficits noted. PSYCHIATRIC:  Normal affect   ASSESSMENT AND PLAN: .    Palpitations Bradycardia -reviewed Zio results, reassuring -ECG unremarkable -spoke at length regarding triggers, etiologies, and management options -after shared decision making, will continue current treatment plan. She will contact me if symptoms worsen -reviewed Bailey flag warning signs that need immediate medical attention  Mitral valve billowing (not identified as prolapse on  echo) -no high risk findings  Dispo: Follow-up in 1 year, or sooner as needed.  I,Mathew Stumpf,acting as a Neurosurgeon for [employer], MD.,have documented all relevant documentation on the behalf of Lynn Red, MD,as directed by  Lynn Red, MD while in the presence of Lynn Red, MD.  I, Lynn Red, MD, have reviewed all documentation for this visit. The documentation on 07/03/23 for the exam, diagnosis, procedures, and orders are all accurate and complete.   Signed, Lynn Red, MD

## 2023-07-03 NOTE — Patient Instructions (Signed)

## 2023-07-10 ENCOUNTER — Other Ambulatory Visit: Payer: 59

## 2023-07-17 ENCOUNTER — Encounter: Payer: 59 | Admitting: Internal Medicine

## 2023-07-18 ENCOUNTER — Other Ambulatory Visit: Payer: Self-pay | Admitting: Neurology

## 2023-07-18 ENCOUNTER — Other Ambulatory Visit (HOSPITAL_BASED_OUTPATIENT_CLINIC_OR_DEPARTMENT_OTHER): Payer: Self-pay

## 2023-07-19 ENCOUNTER — Other Ambulatory Visit (HOSPITAL_BASED_OUTPATIENT_CLINIC_OR_DEPARTMENT_OTHER): Payer: Self-pay

## 2023-07-19 ENCOUNTER — Other Ambulatory Visit: Payer: Self-pay | Admitting: Neurology

## 2023-07-20 ENCOUNTER — Other Ambulatory Visit (HOSPITAL_BASED_OUTPATIENT_CLINIC_OR_DEPARTMENT_OTHER): Payer: Self-pay

## 2023-07-20 MED ORDER — HYDROCODONE-ACETAMINOPHEN 7.5-325 MG PO TABS
2.0000 | ORAL_TABLET | Freq: Three times a day (TID) | ORAL | 0 refills | Status: DC | PRN
  Filled 2023-07-22: qty 180, 30d supply, fill #0
  Filled 2023-07-22: qty 20, 4d supply, fill #0
  Filled 2023-07-22: qty 180, 30d supply, fill #0
  Filled 2023-07-22: qty 160, 26d supply, fill #0

## 2023-07-20 MED ORDER — METHYLPHENIDATE HCL 20 MG PO TABS
20.0000 mg | ORAL_TABLET | Freq: Three times a day (TID) | ORAL | 0 refills | Status: DC
  Filled 2023-07-22: qty 90, 30d supply, fill #0

## 2023-07-20 NOTE — Telephone Encounter (Signed)
Last seen on 05/24/23 Follow up scheduled on 10/23/23 Hydrocodone last filled on 06/22/23 #180 tablets (30 day supply) Ritalin 20 mg last filled on 06/22/23 #80 tablet (27 day supply) Rx pending to be signed

## 2023-07-21 ENCOUNTER — Other Ambulatory Visit (HOSPITAL_BASED_OUTPATIENT_CLINIC_OR_DEPARTMENT_OTHER): Payer: Self-pay

## 2023-07-22 ENCOUNTER — Other Ambulatory Visit (HOSPITAL_BASED_OUTPATIENT_CLINIC_OR_DEPARTMENT_OTHER): Payer: Self-pay

## 2023-08-10 IMAGING — CR DG CHEST 2V
2 series · 2 of 2 positions shown · non-contrast
Comparison: 01/20/2022

CLINICAL DATA: Pneumonia with fatigue.

EXAM:
CHEST - 2 VIEW

[w chest pa]
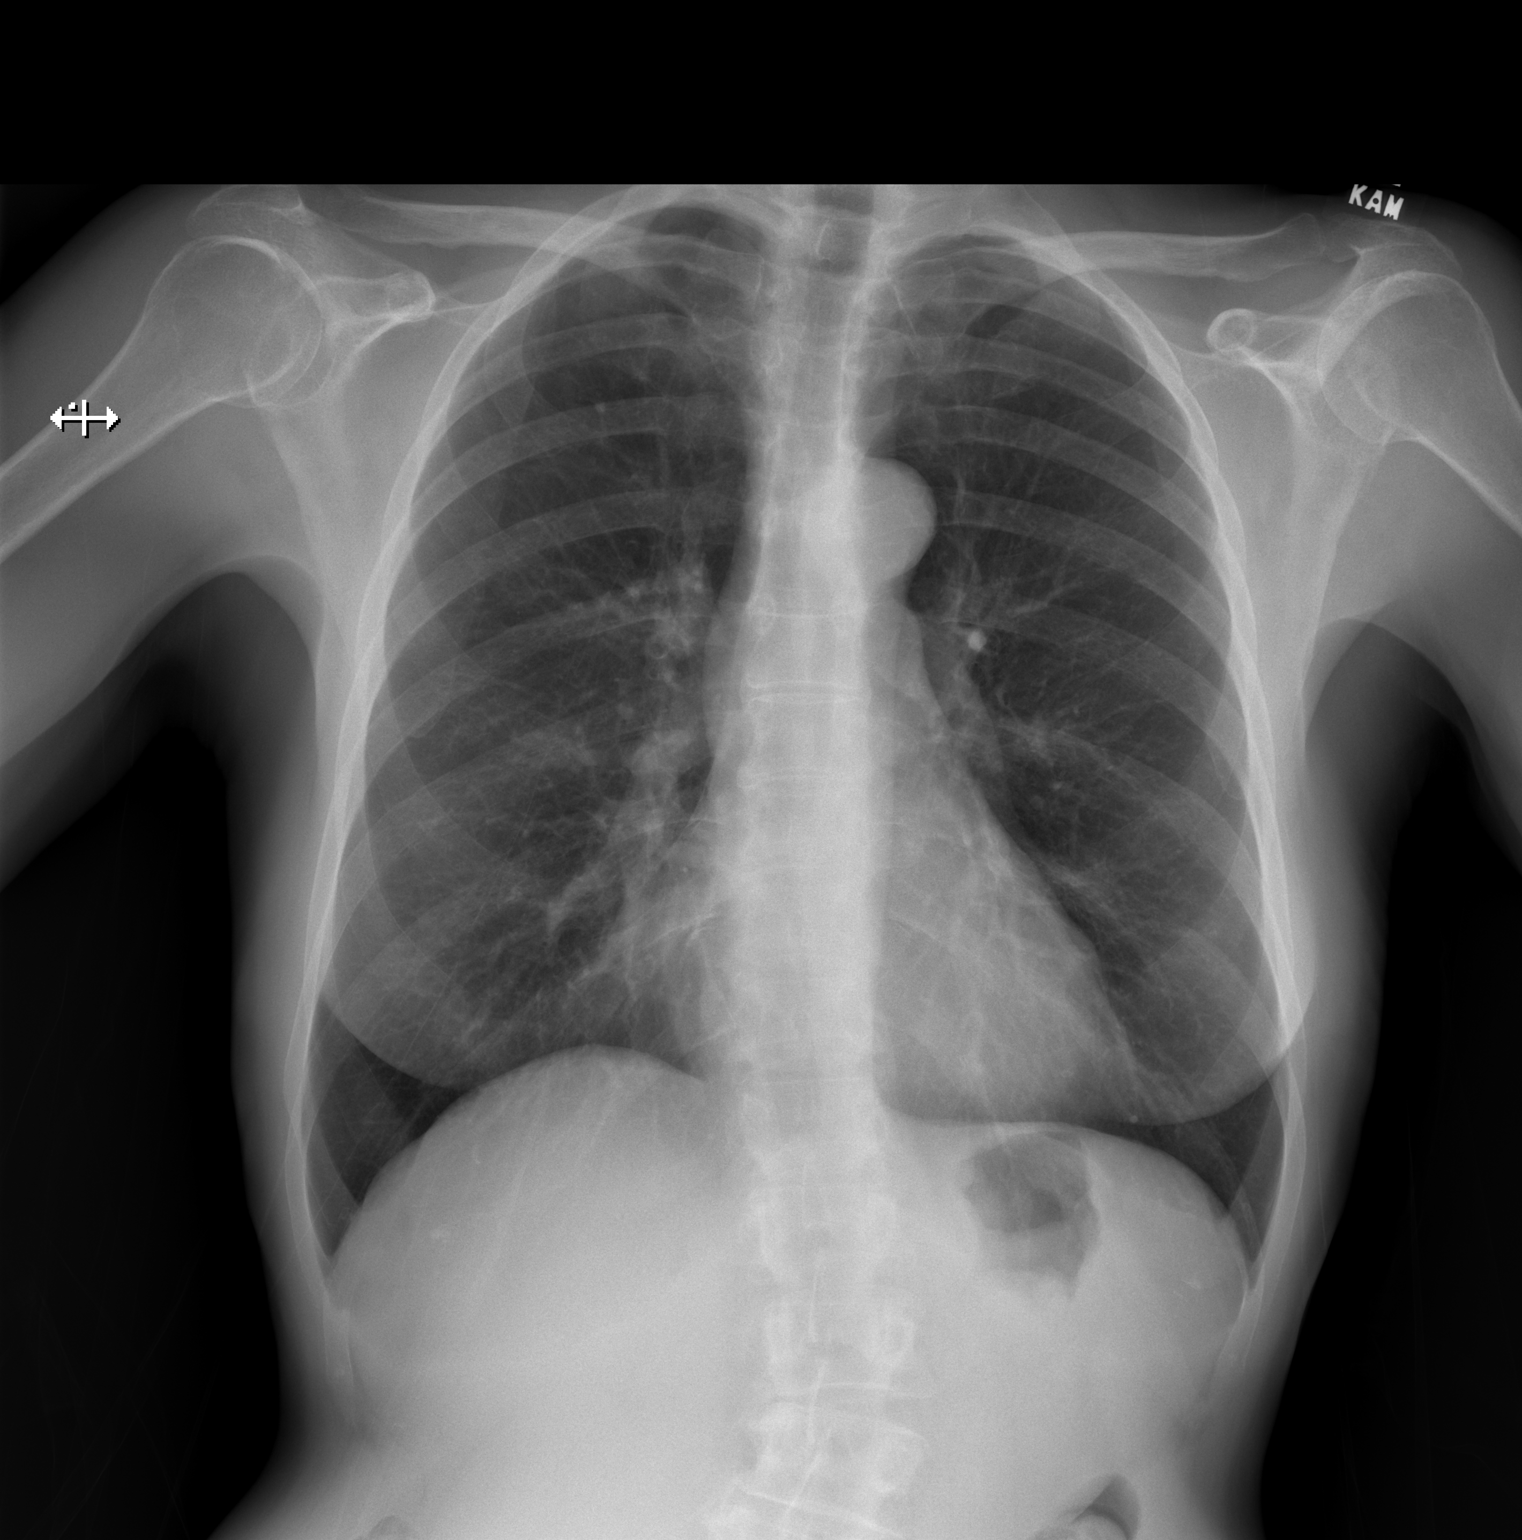

[w chest lat]
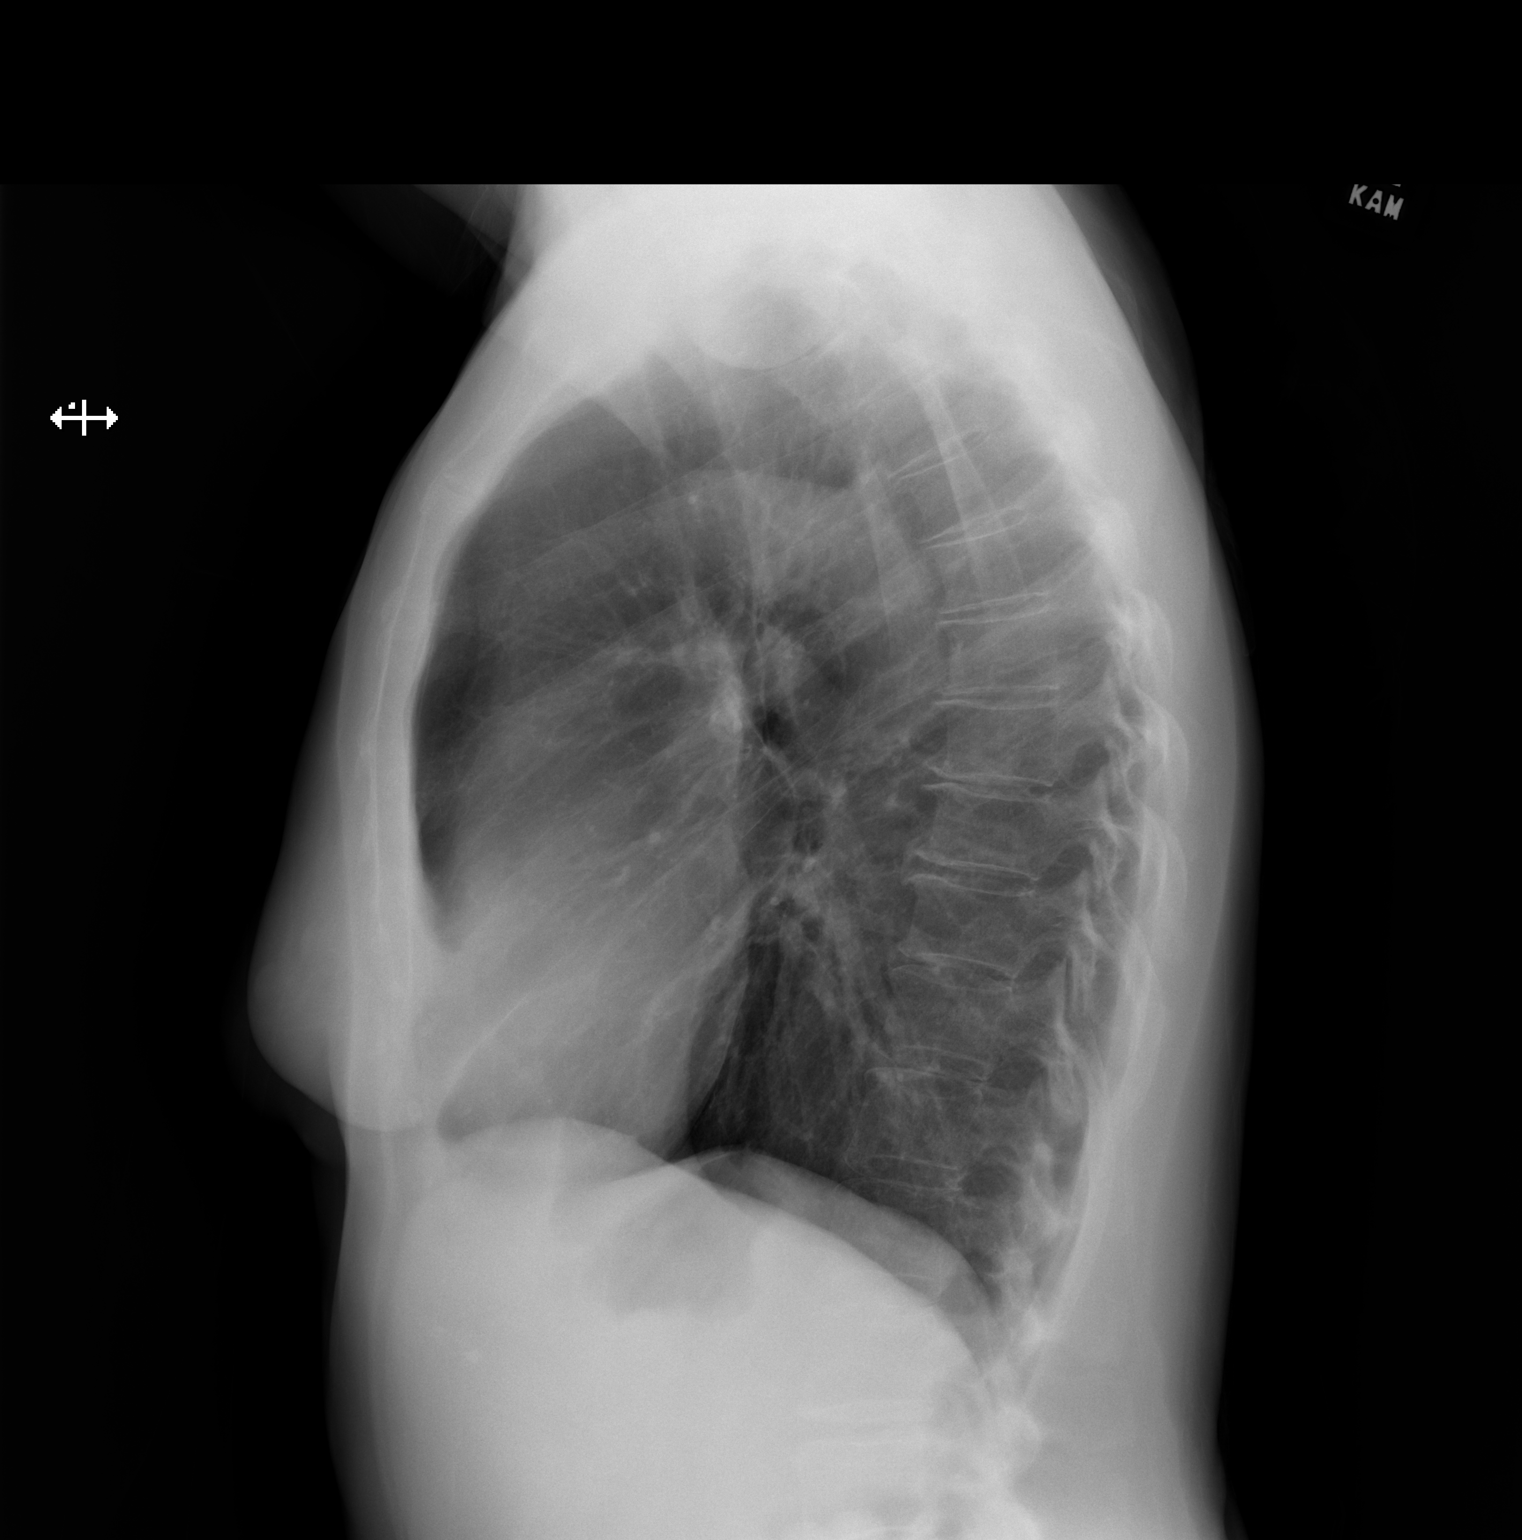

[2 of 2 positions shown; findings below may reference images not displayed]

FINDINGS: The cardiopericardial silhouette is within normal limits for size.
Right retro hilar airspace disease seen on chest CT 01/12/2022 and
subsequent chest x-rays is not visible on the current study
suggesting interval resolution. The cardiopericardial silhouette is
within normal limits for size. The visualized bony structures of the
thorax are unremarkable.
IMPRESSION: No active cardiopulmonary disease.

## 2023-08-16 ENCOUNTER — Other Ambulatory Visit: Payer: Self-pay | Admitting: Neurology

## 2023-08-17 ENCOUNTER — Other Ambulatory Visit (HOSPITAL_BASED_OUTPATIENT_CLINIC_OR_DEPARTMENT_OTHER): Payer: Self-pay

## 2023-08-17 MED ORDER — HYDROCODONE-ACETAMINOPHEN 7.5-325 MG PO TABS
1.0000 | ORAL_TABLET | Freq: Three times a day (TID) | ORAL | 0 refills | Status: DC | PRN
  Filled 2023-08-17 – 2023-08-22 (×2): qty 60, 20d supply, fill #0

## 2023-08-17 MED ORDER — METHYLPHENIDATE HCL 20 MG PO TABS
20.0000 mg | ORAL_TABLET | Freq: Three times a day (TID) | ORAL | 0 refills | Status: DC
  Filled 2023-08-22: qty 90, 30d supply, fill #0
  Filled ????-??-??: fill #0

## 2023-08-17 NOTE — Telephone Encounter (Signed)
Dr.[name] you are work in provider this am.  Dr.[name] patient  Last seen on 05/24/23 Follow up scheduled on 10/23/23 Hydrodone last filled on 07/22/23 #20 tablets (4 day supply) Ritalin 20 mg last filled on 07/22/23 Rx pending to be signed

## 2023-08-19 ENCOUNTER — Other Ambulatory Visit (HOSPITAL_BASED_OUTPATIENT_CLINIC_OR_DEPARTMENT_OTHER): Payer: Self-pay

## 2023-08-21 ENCOUNTER — Encounter: Payer: Self-pay | Admitting: Neurology

## 2023-08-21 NOTE — Telephone Encounter (Signed)
error 

## 2023-08-22 ENCOUNTER — Other Ambulatory Visit (HOSPITAL_BASED_OUTPATIENT_CLINIC_OR_DEPARTMENT_OTHER): Payer: Self-pay

## 2023-08-22 ENCOUNTER — Other Ambulatory Visit: Payer: Self-pay | Admitting: Neurology

## 2023-08-22 MED ORDER — HYDROCODONE-ACETAMINOPHEN 7.5-325 MG PO TABS: 2.0000 | ORAL_TABLET | Freq: Three times a day (TID) | ORAL | 0 refills | Status: DC | PRN

## 2023-08-22 NOTE — Telephone Encounter (Signed)
Pt states she just left the pharmacy and re: her two medications HYDROcodone-acetaminophen (NORCO) 7.5-325 MG tablet  &  methylphenidate (RITALIN) 20 MG tablet  The methylphenidate (RITALIN) 20 MG tablet  as filled properly, but the HYDROcodone-acetaminophen (NORCO) 7.5-325 MG tablet  was not.  The bottle states 20 day supply and she was only given 60 pills, it was supposed to be 180.  The pharmacy advised her to call the office on this error.

## 2023-08-22 NOTE — Telephone Encounter (Signed)
I called and LMVM for pt that prescription was done by AL/NP to [facility] [place].

## 2023-08-22 NOTE — Telephone Encounter (Signed)
Pt has been getting 180 tablets 7.5mg  / 325mg    2 tablets every 8 hr prn.  Last fill was given #60 tablets 08-22-2023.  Last ofv not 05-24-2023 to continue at dose.  UDS positive opiates only.

## 2023-08-22 NOTE — Addendum Note (Signed)
Addended by: Guy Begin on: 08/22/2023 03:48 PM   Modules accepted: Orders

## 2023-08-23 ENCOUNTER — Other Ambulatory Visit (HOSPITAL_BASED_OUTPATIENT_CLINIC_OR_DEPARTMENT_OTHER): Payer: Self-pay

## 2023-08-23 ENCOUNTER — Other Ambulatory Visit: Payer: Self-pay

## 2023-08-23 NOTE — Telephone Encounter (Signed)
Pt calling to report the right amount of the HYDROcodone-acetaminophen (NORCO) 7.5-325 MG tablet needs to be sent to [facility]  not [facility]

## 2023-08-23 NOTE — Telephone Encounter (Signed)
Pt was last seen 05/24/2023 Upcoming Appointment 10/23/2023  [facility] and give her 180 tablets of Hydrocodone Pt only got  [facility] .Pt is supposed to get 120 quantity of the Hydrocodone to [facility] in [place].

## 2023-08-24 ENCOUNTER — Other Ambulatory Visit (HOSPITAL_BASED_OUTPATIENT_CLINIC_OR_DEPARTMENT_OTHER): Payer: Self-pay

## 2023-08-24 MED ORDER — HYDROCODONE-ACETAMINOPHEN 7.5-325 MG PO TABS
2.0000 | ORAL_TABLET | Freq: Three times a day (TID) | ORAL | 0 refills | Status: DC | PRN
Start: 2023-08-24 — End: 2023-09-18
  Filled 2023-08-24 – 2023-09-01 (×3): qty 120, 20d supply, fill #0

## 2023-09-01 ENCOUNTER — Other Ambulatory Visit (HOSPITAL_BASED_OUTPATIENT_CLINIC_OR_DEPARTMENT_OTHER): Payer: Self-pay

## 2023-09-18 ENCOUNTER — Other Ambulatory Visit: Payer: Self-pay | Admitting: Neurology

## 2023-09-18 ENCOUNTER — Other Ambulatory Visit (HOSPITAL_BASED_OUTPATIENT_CLINIC_OR_DEPARTMENT_OTHER): Payer: Self-pay

## 2023-09-18 MED ORDER — METHYLPHENIDATE HCL 20 MG PO TABS
20.0000 mg | ORAL_TABLET | Freq: Three times a day (TID) | ORAL | 0 refills | Status: DC
  Filled 2023-09-18: qty 90, 30d supply, fill #0

## 2023-09-19 ENCOUNTER — Other Ambulatory Visit (HOSPITAL_BASED_OUTPATIENT_CLINIC_OR_DEPARTMENT_OTHER): Payer: Self-pay

## 2023-09-19 ENCOUNTER — Other Ambulatory Visit: Payer: Self-pay

## 2023-09-20 ENCOUNTER — Other Ambulatory Visit (HOSPITAL_BASED_OUTPATIENT_CLINIC_OR_DEPARTMENT_OTHER): Payer: Self-pay

## 2023-09-21 NOTE — Telephone Encounter (Signed)
Pt is wanting to know when this will be filled.

## 2023-09-21 NOTE — Telephone Encounter (Signed)
Last seen on 05/24/23 Follow up scheduled on 10/23/23 Last filled on 09/01/23 #120 tablets (20 day supply) Rx pending to be signed

## 2023-09-22 ENCOUNTER — Other Ambulatory Visit (HOSPITAL_BASED_OUTPATIENT_CLINIC_OR_DEPARTMENT_OTHER): Payer: Self-pay

## 2023-09-22 MED ORDER — HYDROCODONE-ACETAMINOPHEN 7.5-325 MG PO TABS
2.0000 | ORAL_TABLET | Freq: Three times a day (TID) | ORAL | 0 refills | Status: DC | PRN
  Filled 2023-09-22: qty 120, 20d supply, fill #0

## 2023-09-28 ENCOUNTER — Other Ambulatory Visit (HOSPITAL_BASED_OUTPATIENT_CLINIC_OR_DEPARTMENT_OTHER): Payer: Self-pay

## 2023-09-28 MED ORDER — HYDROCODONE-ACETAMINOPHEN 7.5-325 MG PO TABS
2.0000 | ORAL_TABLET | Freq: Three times a day (TID) | ORAL | 0 refills | Status: DC | PRN
  Filled 2023-09-28 – 2023-10-11 (×2): qty 180, 30d supply, fill #0

## 2023-09-28 NOTE — Addendum Note (Signed)
Addended by: Guy Begin on: 09/28/2023 11:41 AM   Modules accepted: Orders

## 2023-09-28 NOTE — Telephone Encounter (Addendum)
I called [facility]  [phone].  She last picked up 20 days supply on 09-23-2023.  Due 10-10-2023.  I have placed prescription for next fill to be #180.  I LMVM for pt that issue was WID on 08-17-2023 did for #60 and then after that was trying to correct with #120.  Then continued to be filled as #120.  She has been from last of note getting #180.

## 2023-09-28 NOTE — Telephone Encounter (Signed)
Pt reports that her HYDROcodone-acetaminophen (NORCO) 7.5-325 MG tablet is supposed to be filled at 180 tablets, she has been trying to get this corrected for some time, please call pt to clear up why this medication is being called in for 120 at some point and 60 tablets at another time when it is supposed to be 180.

## 2023-09-28 NOTE — Telephone Encounter (Signed)
I called and spoke to [place], at [facility] and the prescription was sent to them for the Norco 7.5mg /325mg  tabs 2 tabs every 8 hours prn #180 pt can get 10-11-2023.

## 2023-10-09 ENCOUNTER — Other Ambulatory Visit (HOSPITAL_BASED_OUTPATIENT_CLINIC_OR_DEPARTMENT_OTHER): Payer: Self-pay

## 2023-10-11 ENCOUNTER — Other Ambulatory Visit: Payer: Self-pay

## 2023-10-11 ENCOUNTER — Other Ambulatory Visit (HOSPITAL_BASED_OUTPATIENT_CLINIC_OR_DEPARTMENT_OTHER): Payer: Self-pay

## 2023-10-15 ENCOUNTER — Other Ambulatory Visit: Payer: Self-pay | Admitting: Neurology

## 2023-10-16 ENCOUNTER — Other Ambulatory Visit (HOSPITAL_BASED_OUTPATIENT_CLINIC_OR_DEPARTMENT_OTHER): Payer: Self-pay

## 2023-10-16 MED ORDER — METHYLPHENIDATE HCL 20 MG PO TABS
20.0000 mg | ORAL_TABLET | Freq: Three times a day (TID) | ORAL | 0 refills | Status: DC
  Filled 2023-10-16 – 2023-10-17 (×2): qty 90, 30d supply, fill #0

## 2023-10-16 NOTE — Telephone Encounter (Signed)
Last seen on 05/24/23 Follow up scheduled on 10/23/23 Last filled on 09/20/23 #90 tablets (30 day supply) Rx pending to be signed

## 2023-10-17 ENCOUNTER — Other Ambulatory Visit: Payer: Self-pay

## 2023-10-17 ENCOUNTER — Other Ambulatory Visit (HOSPITAL_BASED_OUTPATIENT_CLINIC_OR_DEPARTMENT_OTHER): Payer: Self-pay

## 2023-10-19 NOTE — Progress Notes (Signed)
Chief Complaint  Patient presents with   Follow-up    Pt in room 1 alone. Here for MS follow up. Patient reports she is doing well, no concerns.      HISTORY OF PRESENT ILLNESS:  10/23/23 ALL:  [name] is a 62 y.o. female here today for follow up for RRMS. She remains off Tecfidera. Labs have been stable. MR brain stable 05/2023.   She reports doing well. No new or exacerbating symptoms. She feels gait is stable. She has a cane but does not use it regularly. Pain controlled on hydrocodone 7.5-325mg  two tablets every 8 hours as needed. She usually tries to do 1.5 tablets every 4 hours. She tries to take less when she can. PDMP appropriate. Last filled 09/28/2023. She continues to have chronic low back pain. She is followed by [facility] for right knee pain. Needs replacement. She reports BP is normally 120s/60-70. She is under more stress as her husband is suffering with LBD.   She continues Ritalin 20mg  TID.  She takes first dose early in the morning then returns to sleep. She feels she wakes up sharper when doing this. She sleeps well most nights when husband is resting. Alprazolam 0.25mg  taken at bedtime as needed for insomnia and anxiety but she has not taken recently in case her husband has a bad night.   Hyoscyamine as needed helps with urinary frequency/urge incontinence. She feels that she has urinary retention if she takes it daily.   She continues B12 supplements. She reports vitamin D caused heart palpitations.     HISTORY (copied from Dr Bonnita Hollow previous note)  [name] is a 62 y.o. woman with relapsing remitting multiple sclerosis diagnosed in 2008.     Update 05/24/2023: She stopped the Tecfidera after her imaging in February 2023 and inotes no new lesions  No exacerbations.   She still has a lot of symptoms  . She feels her MS is stable.      She was evaluated by rheumatology due to more knee, spine and hip pain.  She was told she does not have RA and was  told she has fibromyalgia.     Her gait has continued to worsen ad she uses the walker more.    Her right leg is weaker than left and she has a foot drop at times.  She tires out very easily.   She wakes up to urinate 3-4 times a night which is better.    She wears a Poise pad due to occasional incontinence..    She usually falls back asleep easily.   She denies snoring.   She feels less depressed off any antidepressant.   She rarely takes a single xanax 0.25 mg at night for bad insomnia but has a hangover the next day.   She has had more fatigue. Brand name Ritalin helps her ADD the most but is not covered.    She also tried Adderall 20 mg po tid and felt it may have worked a little better than methylphenidate but caused HA.      She continues to experience pain in her back and spasms in back and legs, and knee pain, helped by hydrocodone (she takes 6 x 7.5 mg daily).  Gabapentin (even 300 mg) causes sleepiness and was not tolerated.   She is compliant (PDMP has been checked).  She has not shown any drug-seeking behavior.  Hhdrocodone has helped her pain the most.   When pain flares, she  feels her walking does worse.     Sometimes has neck pain and occipital pain.    Thoracic pain has been worse recently.       Vit D 50000 units causes heart racing but lower dose daily is tolerated.       Vit B12 was also low in the past.        MS History:  In 2008, she had an MRI of the brain performed which was consistent with multiple sclerosis and she was diagnosed with relapsing remitting MS. He was initially placed on Avonex for therapy. She had some exacerbations with weakness and clumsiness with her gait and she started to see Dr. Leotis Shames. He placed her on Tysabri she is on Tysabri between 2010 and 2013. 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    Imaging Review: 12/13/17 MRI of the cervical and thoracic spine:  She  has multiple T2 hyperintense lesions in the spinal cord the largest to the right C2-C3 and smaller ones at C3-C4, C4, C5-C6, C6-C7 and T1-T2 all the foci were present compared to the 2016 MRI.   The MRI of the thoracic spine showed small foci adjacent to T2 and T5-6.  None of the spinal plaques appeared to be acute.     MRI 02/25/2020 brain:   Multiple T2/FLAIR hyperintense foci in the hemispheres in a pattern and configuration consistent with chronic demyelinating plaque associated with multiple sclerosis.  None of the foci appears to be acute.  Compared to the MRI from 03/18/2017, there is no interval change.    MRI 2016, 2017 and 2018 showed no new lesions   MRI of the brain 05/19/2022 showed no new lesions.   MRI of the thoracic spine 05/29/2022 showed no new lesions.   MRI of the lumbar spine 05/29/2022 showed At L3-L4, there is mild to moderate spinal stenosis due to disc protrusion and other degenerative change.  Additionally there is moderate right foraminal narrowing and moderate bilateral lateral recess stenosis.  There does not appear to be nerve root compression.   Milder degenerative changes at other lumbar levels not causing spinal stenosis or nerve root compression.   REVIEW OF SYSTEMS: Out of a complete 14 system review of symptoms, the patient complains only of the following symptoms, chronic pain, fatigue, anxiety, insomnia and all other reviewed systems are negative.   ALLERGIES: Allergies  Allergen Reactions   Ibuprofen Other (See Comments)    CAUSES LYMPHEDEMA   Nsaids Other (See Comments)    AVOIDS ANY SODIUM-CONTAINING MED. -- CAUSES LYMPHEDEMA   Other Swelling and Other (See Comments)    Laxatives and artificial sweeteners- cause leg swelling   Vitamin D Analogs Palpitations and Other (See Comments)    Cannot tolerate at 50,000 units OR even 2,000 units     HOME MEDICATIONS: Outpatient Medications Prior to Visit  Medication Sig Dispense Refill   ALPRAZolam (XANAX)  0.25 MG tablet Take 1 tablet (0.25 mg total) by mouth at bedtime as needed for anxiety. 90 tablet 1   cyanocobalamin (VITAMIN B12) 1000 MCG/ML injection INJECT 1 ML TWICE PER MONTH 2 mL 11   HYDROcodone-acetaminophen (NORCO) 7.5-325 MG tablet Take 2 tablets by mouth every 8 (eight) hours as needed. 180 tablet 0   hyoscyamine (LEVSIN SL) 0.125 MG SL tablet Place 1 tablet (0.125 mg total) under the tongue every 4 (four) hours as needed. 270  tablet 0   methylphenidate (RITALIN) 20 MG tablet Take 1 tablet (20 mg total) by mouth 3 (three) times daily with meals. 90 tablet 0   metoprolol tartrate (LOPRESSOR) 50 MG tablet TAKE 1/2 TABLET BY MOUTH EVERY 12 HOURS AS NEEDED FOR PALPITATIONS 30 tablet 5   Syringe/Needle, Disp, (SYRINGE 3CC/23GX1") 23G X 1" 3 ML MISC 3 Syringes by Does not apply route every 30 (thirty) days. (Patient taking differently: 3 Syringes by Does not apply route every 30 (thirty) days. 26Gx5/6") 3 each 3   No facility-administered medications prior to visit.     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   Headache(784.0)    History of concussion YOUNG ADULT--  NO RESIDUAL   IBS (irritable bowel syndrome)    Movement disorder    MS (multiple sclerosis) (HCC)    MVP (mitral valve prolapse) HX HEART RACING  YRS AGO   ASYMPTOMATIC SINCE THEN   Vision abnormalities    Weakness of right leg SECONDARY TO MS     PAST SURGICAL HISTORY: Past Surgical History:  Procedure Laterality Date   CESAREAN SECTION  X3   BILATERAL TUBAL LIGATION W/ LAST ONE   DESTRUCTION OF ANAL CONDYLOMA  07-07-2005  DR TOTH   HERNIA REPAIR     KNEE ARTHROSCOPY WITH LATERAL MENISECTOMY  10/26/2012   Procedure: KNEE ARTHROSCOPY WITH LATERAL MENISECTOMY;   Surgeon: Javier Docker, MD;  Location: [facility];  Service: Orthopedics;  Laterality: Left;  Left Knee Arthrsocopy with Partial Lateral Menisectomy   LAPAROSCOPIC ASSISTED VAGINAL HYSTERECTOMY  01-15-2002  DR Gerlene Burdock HOLLAND/ DR TIM DAVIS   AND REPAIR VENTRAL HERNIA   LASER ABLATION CONDOLAMATA N/A 04/26/2013   Procedure: LASER ABLATION CONDOLAMATA;  Surgeon: Romie Levee, MD;  Location: [facility];  Service: General;  Laterality: N/A;   TONSILLECTOMY     TOTAL KNEE ARTHROPLASTY Left 11/05/2015   Procedure: LEFT TOTAL KNEE ARTHROPLASTY;  Surgeon: Jene Every, MD;  Location: [facility];  Service: Orthopedics;  Laterality: Left;     FAMILY HISTORY: Family History  Problem Relation Age of Onset   Diabetes Mother    Hypertension Mother    Stroke Mother    Bowel Disease Brother    Diabetes Brother    Healthy Brother    Emphysema Father    Heart attack Father      SOCIAL HISTORY: Social History   Socioeconomic History   Marital status: Married    Spouse name: Not on file   Number of children: 3   Years of education: Not on file   Highest education level: Not on file  Occupational History   Not on file  Tobacco Use   Smoking status: Never   Smokeless tobacco: Never  Substance and Sexual Activity   Alcohol use: No   Drug use: No   Sexual activity: Not on file  Other Topics Concern   Not on file  Social History Narrative   Not on file   Social Determinants of Health   Financial Resource Strain: Not on file  Food Insecurity: No Food Insecurity (07/03/2023)   Hunger Vital Sign  Worried About Programme researcher, broadcasting/film/video in the Last Year: Never true    Ran Out of Food in the Last Year: Never true  Transportation Needs: No Transportation Needs (07/03/2023)   PRAPARE - Administrator, Civil Service (Medical): No    Lack of Transportation (Non-Medical): No  Physical Activity: Inactive (07/03/2023)   Exercise Vital Sign    Days of Exercise  per Week: 0 days    Minutes of Exercise per Session: 0 min  Stress: Not on file  Social Connections: Not on file  Intimate Partner Violence: Not on file      PHYSICAL EXAM  Vitals:   10/23/23 1323  BP: (!) 159/79  Pulse: 75  Weight: 154 lb 9.6 oz (70.1 kg)  Height: 5\' 5"  (1.651 m)    No data found.   Orthostatics negative. Afebrile   Body mass index is 25.73 kg/m.   Generalized: Well developed, in no acute distress  Cardiology: normal rate and rhythm, no murmur auscultated  Respiratory: clear to auscultation bilaterally    Neurological examination  Mentation: Alert oriented to time, place, history taking. Follows all commands speech and language fluent Cranial nerve II-XII: Pupils were equal round reactive to light. Extraocular movements were full, visual field were full on confrontational test. Facial sensation and strength were normal. Head turning and shoulder shrug  were normal and symmetric. Motor: The motor testing reveals 5 over 5 strength of all 4 extremities. Tone increased in legs.  Sensory: Sensory testing is intact to soft touch on all 4 extremities. No evidence of extinction is noted.  Coordination: Cerebellar testing reveals good finger-nose-finger and heel-to-shin bilaterally.  Gait and station: Normal station, gait mildly wide, stable without assistive device.  Reflexes: Deep tendon reflexes are symmetric and brisk bilaterally.     DIAGNOSTIC DATA (LABS, IMAGING, TESTING) - I reviewed patient records, labs, notes, testing and imaging myself where available.  Lab Results  Component Value Date   WBC 4.2 12/07/2022   HGB 15.2 12/07/2022   HCT 46.0 12/07/2022   MCV 90 12/07/2022   PLT 264 12/07/2022      Component Value Date/Time   NA 140 12/07/2022 1349   K 4.4 12/07/2022 1349   CL 100 12/07/2022 1349   CO2 26 12/07/2022 1349   GLUCOSE 94 12/07/2022 1349   GLUCOSE 88 08/17/2022 1234   BUN 14 12/07/2022 1349   CREATININE 0.95 12/07/2022 1349    CREATININE 0.84 07/08/2022 0930   CALCIUM 9.4 12/07/2022 1349   PROT 7.0 12/07/2022 1349   ALBUMIN 4.4 12/07/2022 1349   AST 26 12/07/2022 1349   ALT 26 12/07/2022 1349   ALKPHOS 109 12/07/2022 1349   BILITOT 0.6 12/07/2022 1349   GFRNONAA >60 08/17/2022 1234   GFRNONAA 86 09/15/2020 1120   GFRAA 100 09/15/2020 1120   Lab Results  Component Value Date   CHOL 184 07/08/2022   HDL 89 07/08/2022   LDLCALC 80 07/08/2022   TRIG 71 07/08/2022   CHOLHDL 2.1 07/08/2022   Lab Results  Component Value Date   HGBA1C  03/10/2010    5.5 (NOTE) The ADA recommends the following therapeutic goal for glycemic control related to Hgb A1c measurement: Goal of therapy: <6.5 Hgb A1c  Reference: American Diabetes Association: Clinical Practice Recommendations 2010, Diabetes Care, 2010, 33: (Suppl  1).   Lab Results  Component Value Date   VITAMINB12 1,332 (H) 12/07/2022   Lab Results  Component Value Date   TSH 1.63 12/09/2022  No data to display               No data to display           ASSESSMENT AND PLAN  62 y.o. year old female  has a past medical history of ADD (attention deficit disorder), Anxiety, Arthritis, Bladder incontinence, Bone spur, Chronic fatigue, Dysrhythmia, Generalized neuropathy, Headache(784.0), History of concussion (YOUNG ADULT--  NO RESIDUAL), IBS (irritable bowel syndrome), Movement disorder, MS (multiple sclerosis) (HCC), MVP (mitral valve prolapse) (HX HEART RACING  YRS AGO), Vision abnormalities, and Weakness of right leg (SECONDARY TO MS). here with   Multiple sclerosis (HCC)  Chronic prescription opiate use  Vitamin D deficiency - Plan: Vitamin D, 25-hydroxy  Vitamin B 12 deficiency - Plan: Vitamin B12  Zelpha is doing fairly well, today. We will continue current treatment plan. PDMP reviewed and appropriate. We will update labs, today. She will keep an eye on her BP. Continue close follow up with Dr Lenord Fellers, PCP. She will continue  healthy lifestyle habits. She will follow up in 4-6 months with Dr Epimenio Foot.    Orders Placed This Encounter  Procedures   Vitamin B12   Vitamin D, 25-hydroxy     No orders of the defined types were placed in this encounter.   Shawnie Dapper, MSN, FNP-C 10/23/2023, 2:16 PM  [facility] [address], [address] [place], [place] [address] [phone]

## 2023-10-19 NOTE — Patient Instructions (Signed)
Below is our plan:  We will continue current treatment plan.   Please make sure you are staying well hydrated. I recommend 50-60 ounces daily. Well balanced diet and regular exercise encouraged. Consistent sleep schedule with 6-8 hours recommended.   Please continue follow up with care team as directed.   Follow up with Dr [name] in 6 months   You may receive a survey regarding today's visit. I encourage you to leave honest feed back as I do use this information to improve patient care. Thank you for seeing me today!    

## 2023-10-23 ENCOUNTER — Ambulatory Visit: Payer: 59 | Admitting: Family Medicine

## 2023-10-23 ENCOUNTER — Encounter: Payer: Self-pay | Admitting: Family Medicine

## 2023-10-23 VITALS — BP 159/79 | HR 75 | Ht 65.0 in | Wt 154.6 lb

## 2023-10-23 DIAGNOSIS — M545 Low back pain, unspecified: Secondary | ICD-10-CM

## 2023-10-23 DIAGNOSIS — E538 Deficiency of other specified B group vitamins: Secondary | ICD-10-CM

## 2023-10-23 DIAGNOSIS — R5382 Chronic fatigue, unspecified: Secondary | ICD-10-CM

## 2023-10-23 DIAGNOSIS — Z79891 Long term (current) use of opiate analgesic: Secondary | ICD-10-CM

## 2023-10-23 DIAGNOSIS — E559 Vitamin D deficiency, unspecified: Secondary | ICD-10-CM | POA: Diagnosis not present

## 2023-10-23 DIAGNOSIS — G35 Multiple sclerosis: Secondary | ICD-10-CM | POA: Diagnosis not present

## 2023-10-23 DIAGNOSIS — G8929 Other chronic pain: Secondary | ICD-10-CM

## 2023-10-23 DIAGNOSIS — N3941 Urge incontinence: Secondary | ICD-10-CM

## 2023-10-24 LAB — VITAMIN D 25 HYDROXY (VIT D DEFICIENCY, FRACTURES): Vit D, 25-Hydroxy: 17.1 ng/mL — ABNORMAL LOW (ref 30.0–100.0)

## 2023-10-24 LAB — VITAMIN B12: Vitamin B-12: 529 pg/mL (ref 232–1245)

## 2023-10-31 ENCOUNTER — Encounter: Payer: Self-pay | Admitting: Family Medicine

## 2023-10-31 NOTE — Telephone Encounter (Signed)
Tried calling pt, mailbox full, unable to LVM.  ?

## 2023-11-01 ENCOUNTER — Other Ambulatory Visit: Payer: Self-pay | Admitting: *Deleted

## 2023-11-01 MED ORDER — METHOCARBAMOL 500 MG PO TABS: 500.0000 mg | ORAL_TABLET | Freq: Three times a day (TID) | ORAL | 5 refills | Status: DC

## 2023-11-09 ENCOUNTER — Other Ambulatory Visit: Payer: Self-pay | Admitting: Neurology

## 2023-11-09 ENCOUNTER — Other Ambulatory Visit (HOSPITAL_BASED_OUTPATIENT_CLINIC_OR_DEPARTMENT_OTHER): Payer: Self-pay

## 2023-11-09 MED ORDER — HYDROCODONE-ACETAMINOPHEN 7.5-325 MG PO TABS
2.0000 | ORAL_TABLET | Freq: Three times a day (TID) | ORAL | 0 refills | Status: DC | PRN
  Filled 2023-11-09: qty 180, 30d supply, fill #0

## 2023-11-09 NOTE — Telephone Encounter (Signed)
Last seen on 10/23/23 Follow up scheduled on 05/28/24 Last filled 10/11/23 #180 tablets (30 day supply) Rx pending to be signed

## 2023-11-10 ENCOUNTER — Other Ambulatory Visit: Payer: Self-pay | Admitting: Neurology

## 2023-11-13 ENCOUNTER — Other Ambulatory Visit (HOSPITAL_BASED_OUTPATIENT_CLINIC_OR_DEPARTMENT_OTHER): Payer: Self-pay

## 2023-11-13 MED ORDER — METHYLPHENIDATE HCL 20 MG PO TABS
20.0000 mg | ORAL_TABLET | Freq: Three times a day (TID) | ORAL | 0 refills | Status: DC
  Filled 2023-11-17: qty 90, 30d supply, fill #0
  Filled ????-??-??: fill #0

## 2023-11-13 NOTE — Telephone Encounter (Signed)
Last seen on 10/23/23 Follow up scheduled on 05/28/24 Last filled on 10/18/23 #90 tablets (30 day supply) Rx pending to be signed

## 2023-11-14 ENCOUNTER — Other Ambulatory Visit: Payer: Self-pay | Admitting: *Deleted

## 2023-11-17 ENCOUNTER — Other Ambulatory Visit: Payer: Self-pay

## 2023-11-17 ENCOUNTER — Other Ambulatory Visit (HOSPITAL_BASED_OUTPATIENT_CLINIC_OR_DEPARTMENT_OTHER): Payer: Self-pay

## 2023-12-11 ENCOUNTER — Other Ambulatory Visit: Payer: Self-pay | Admitting: Neurology

## 2023-12-11 ENCOUNTER — Other Ambulatory Visit (HOSPITAL_BASED_OUTPATIENT_CLINIC_OR_DEPARTMENT_OTHER): Payer: Self-pay

## 2023-12-11 MED ORDER — METHYLPHENIDATE HCL 20 MG PO TABS
20.0000 mg | ORAL_TABLET | Freq: Three times a day (TID) | ORAL | 0 refills | Status: DC
  Filled 2023-12-11 – 2023-12-16 (×3): qty 90, 30d supply, fill #0

## 2023-12-11 NOTE — Telephone Encounter (Signed)
 Last seen on 10/23/23 Follow up scheduled on 05/28/24 Last filled on 11/17/23 #90 tablets (30 day supply) Rx pending to be signed

## 2023-12-13 ENCOUNTER — Other Ambulatory Visit (HOSPITAL_BASED_OUTPATIENT_CLINIC_OR_DEPARTMENT_OTHER): Payer: Self-pay

## 2023-12-14 ENCOUNTER — Other Ambulatory Visit: Payer: Self-pay

## 2023-12-14 MED ORDER — HYDROCODONE-ACETAMINOPHEN 7.5-325 MG PO TABS: 2.0000 | ORAL_TABLET | Freq: Three times a day (TID) | ORAL | 0 refills | Status: DC | PRN

## 2023-12-14 NOTE — Telephone Encounter (Signed)
 Pt last seen 10/23/2023 Upcoming Appointment 05/28/2024  Hydrocodone last filled 11/10/2023 30 day Supply Escript 12/14/2023

## 2023-12-16 ENCOUNTER — Other Ambulatory Visit (HOSPITAL_BASED_OUTPATIENT_CLINIC_OR_DEPARTMENT_OTHER): Payer: Self-pay

## 2024-01-10 ENCOUNTER — Other Ambulatory Visit: Payer: Self-pay | Admitting: Neurology

## 2024-01-11 ENCOUNTER — Other Ambulatory Visit (HOSPITAL_BASED_OUTPATIENT_CLINIC_OR_DEPARTMENT_OTHER): Payer: Self-pay

## 2024-01-11 MED ORDER — METHYLPHENIDATE HCL 20 MG PO TABS
20.0000 mg | ORAL_TABLET | Freq: Three times a day (TID) | ORAL | 0 refills | Status: DC
  Filled 2024-01-11 – 2024-01-13 (×2): qty 90, 30d supply, fill #0

## 2024-01-11 MED ORDER — HYDROCODONE-ACETAMINOPHEN 7.5-325 MG PO TABS: 2.0000 | ORAL_TABLET | Freq: Three times a day (TID) | ORAL | 0 refills | Status: DC | PRN

## 2024-01-11 NOTE — Telephone Encounter (Signed)
 Dr. Godwin Lat- no mention if you were going to continue refilling in last note. Can you update if ok and then e-scribe refill?   Last seen 10/23/23 and next f/u 05/28/24. Last refilled 12/16/23 #90.

## 2024-01-11 NOTE — Telephone Encounter (Signed)
 Last seen 10/23/23, next appt 05/28/24  Dispenses   Dispensed Days Supply Quantity Provider Pharmacy  HYDROCODONE -ACETAMIN 7.5-325 12/14/2023 30 180 each [name] LABOR, MD [facility] [phone] - S...  HYDROcodone -acetaminophen  (NORCO) 7.5-325 MG tablet 11/10/2023 30 180 tablet [name], [name] LABOR, MD [facility] [place] -...  HYDROcodone -acetaminophen  (NORCO) 7.5-325 MG tablet 10/11/2023 30 180 tablet [name], [name] LABOR, MD [facility] [place] -...  HYDROcodone -acetaminophen  (NORCO) 7.5-325 MG tablet 09/23/2023 20 120 tablet [name], [name] LABOR, MD [facility] [place] -...  HYDROcodone -acetaminophen  (NORCO) 7.5-325 MG tablet 09/01/2023 20 120 tablet [name], MD [facility] [place] -...  HYDROcodone -acetaminophen  (NORCO) 7.5-325 MG tablet 08/22/2023 20 60 tablet [name], [name], MD [facility] [place] -...  HYDROcodone -acetaminophen  (NORCO) 7.5-325 MG tablet 07/22/2023 4 20 tablet [name], [name] LABOR, MD [facility] [place] -...  HYDROcodone -acetaminophen  (NORCO) 7.5-325 MG tablet 06/22/2023 30 180 tablet [name], [name] LABOR, MD [facility] [place] -...  HYDROcodone -acetaminophen  (NORCO) 7.5-325 MG tablet 05/25/2023 30 180 tablet [name], [name] LABOR, MD [facility] [place] -...  HYDROCODONE -ACETAMIN 7.5-325 04/24/2023 30 180 each [name] LABOR, MD [facility] [phone] - S...  HYDROCODONE -ACETAMIN 7.5-325 03/27/2023 30 180 each [name] LABOR, MD [facility] [phone] - S...  HYDROcodone -acetaminophen  (NORCO) 7.5-325 MG tablet 02/28/2023 30 180 tablet [name], [name] LABOR, MD [facility] [place] -...  HYDROcodone -acetaminophen  (NORCO) 7.5-325 MG tablet 01/31/2023 30 180 tablet [name], [name] LABOR, MD [facility] [place] -.SABRASABRA

## 2024-01-13 ENCOUNTER — Other Ambulatory Visit (HOSPITAL_BASED_OUTPATIENT_CLINIC_OR_DEPARTMENT_OTHER): Payer: Self-pay

## 2024-02-08 ENCOUNTER — Other Ambulatory Visit (HOSPITAL_BASED_OUTPATIENT_CLINIC_OR_DEPARTMENT_OTHER): Payer: Self-pay

## 2024-02-08 ENCOUNTER — Other Ambulatory Visit: Payer: Self-pay | Admitting: Neurology

## 2024-02-08 MED ORDER — METHYLPHENIDATE HCL 20 MG PO TABS
20.0000 mg | ORAL_TABLET | Freq: Three times a day (TID) | ORAL | 0 refills | Status: DC
  Filled 2024-02-08 – 2024-02-10 (×2): qty 90, 30d supply, fill #0

## 2024-02-08 NOTE — Telephone Encounter (Signed)
 Last seen 10/23/23, next appt 05/28/24  Dispenses   Dispensed Days Supply Quantity Provider Pharmacy  methylphenidate (RITALIN) 20 MG tablet 01/15/2024 30 90 tablet [name], Pearletha Furl, MD [facility] [place] -...  methylphenidate (RITALIN) 20 MG tablet 12/16/2023 30 90 tablet [name], Pearletha Furl, MD [facility] [place] -...  methylphenidate (RITALIN) 20 MG tablet 11/17/2023 30 90 tablet [name], Pearletha Furl, MD [facility] [place] -...  methylphenidate (RITALIN) 20 MG tablet 10/18/2023 30 90 tablet [name], Pearletha Furl, MD [facility] [place] -...  methylphenidate (RITALIN) 20 MG tablet 09/20/2023 30 90 tablet [name], Pearletha Furl, MD [facility] [place] -...  methylphenidate (RITALIN) 20 MG tablet 08/22/2023 30 90 tablet [name], Porfirio Mylar, MD [facility] [place] -...  methylphenidate (RITALIN) 20 MG tablet 07/22/2023 30 90 tablet [name], Pearletha Furl, MD [facility] [place] -...  methylphenidate (RITALIN) 20 MG tablet 06/22/2023 27 80 tablet [name], Pearletha Furl, MD [facility] [place] -...  methylphenidate (RITALIN) 20 MG tablet 05/26/2023 30 90 tablet [name], Pearletha Furl, MD [facility] [place] -...  methylphenidate (RITALIN) 20 MG tablet 04/25/2023 30 90 tablet [name], Pearletha Furl, MD [facility] [place] -...  methylphenidate (RITALIN) 20 MG tablet 04/01/2023 30 90 tablet [name], Pearletha Furl, MD [facility] [place] -...  methylphenidate (RITALIN) 20 MG tablet 02/24/2023 30 90 tablet Lomax, Amy, NP [facility]  -.Marland KitchenMarland Kitchen

## 2024-02-10 ENCOUNTER — Other Ambulatory Visit (HOSPITAL_BASED_OUTPATIENT_CLINIC_OR_DEPARTMENT_OTHER): Payer: Self-pay

## 2024-02-13 ENCOUNTER — Other Ambulatory Visit: Payer: Self-pay | Admitting: Neurology

## 2024-02-13 MED ORDER — HYDROCODONE-ACETAMINOPHEN 7.5-325 MG PO TABS: 2.0000 | ORAL_TABLET | Freq: Three times a day (TID) | ORAL | 0 refills | Status: DC | PRN

## 2024-02-13 NOTE — Telephone Encounter (Signed)
 Pt called wanting to know when this will be filled for her. Please advise. p

## 2024-02-13 NOTE — Telephone Encounter (Signed)
 Last seen 10/23/23, next appt 05/28/24 Dispenses   Dispensed Days Supply Quantity Provider Pharmacy  HYDROCODONE-ACETAMIN 7.5-325 01/11/2024 30 180 each Asa Lente, MD [facility] [phone] - S...  HYDROCODONE-ACETAMIN 7.5-325 12/14/2023 30 180 each Asa Lente, MD [facility] [phone] - S...  HYDROcodone-acetaminophen (NORCO) 7.5-325 MG tablet 11/10/2023 30 180 tablet [name], Pearletha Furl, MD [facility] [place] -...  HYDROcodone-acetaminophen (NORCO) 7.5-325 MG tablet 10/11/2023 30 180 tablet [name], Pearletha Furl, MD [facility] [place] -...  HYDROcodone-acetaminophen (NORCO) 7.5-325 MG tablet 09/23/2023 20 120 tablet [name], Pearletha Furl, MD [facility] [place] -...  HYDROcodone-acetaminophen (NORCO) 7.5-325 MG tablet 09/01/2023 20 120 tablet Anson Fret, MD [facility] [place] -...  HYDROcodone-acetaminophen (NORCO) 7.5-325 MG tablet 08/22/2023 20 60 tablet [name], Porfirio Mylar, MD [facility] [place] -...  HYDROcodone-acetaminophen (NORCO) 7.5-325 MG tablet 07/22/2023 4 20 tablet [name], Pearletha Furl, MD [facility] [place] -...  HYDROcodone-acetaminophen (NORCO) 7.5-325 MG tablet 06/22/2023 30 180 tablet [name], Pearletha Furl, MD [facility] [place] -...  HYDROcodone-acetaminophen (NORCO) 7.5-325 MG tablet 05/25/2023 30 180 tablet [name], Pearletha Furl, MD [facility] [place] -...  HYDROCODONE-ACETAMIN 7.5-325 04/24/2023 30 180 each Asa Lente, MD [facility] [phone] - S...  HYDROCODONE-ACETAMIN 7.5-325 03/27/2023 30 180 each Asa Lente, MD [facility] [phone] - S...  HYDROcodone-acetaminophen (NORCO) 7.5-325 MG tablet 02/28/2023 30 180 tablet [name], Pearletha Furl, MD [facility] [place] -.Marland KitchenMarland Kitchen

## 2024-02-13 NOTE — Telephone Encounter (Signed)
 Last seen 10/22/24, next appt 05/28/24   Dispenses   Dispensed Days Supply Quantity Provider Pharmacy  HYDROCODONE-ACETAMIN 7.5-325 01/11/2024 30 180 each Asa Lente, MD [facility] [phone] - S...  HYDROCODONE-ACETAMIN 7.5-325 12/14/2023 30 180 each Asa Lente, MD [facility] [phone] - S...  HYDROcodone-acetaminophen (NORCO) 7.5-325 MG tablet 11/10/2023 30 180 tablet [name], Pearletha Furl, MD [facility] [place] -...  HYDROcodone-acetaminophen (NORCO) 7.5-325 MG tablet 10/11/2023 30 180 tablet [name], Pearletha Furl, MD [facility] [place] -...  HYDROcodone-acetaminophen (NORCO) 7.5-325 MG tablet 09/23/2023 20 120 tablet [name], Pearletha Furl, MD [facility] [place] -...  HYDROcodone-acetaminophen (NORCO) 7.5-325 MG tablet 09/01/2023 20 120 tablet Anson Fret, MD [facility] [place] -...  HYDROcodone-acetaminophen (NORCO) 7.5-325 MG tablet 08/22/2023 20 60 tablet [name], Porfirio Mylar, MD [facility] [place] -...  HYDROcodone-acetaminophen (NORCO) 7.5-325 MG tablet 07/22/2023 4 20 tablet [name], Pearletha Furl, MD [facility] [place] -...  HYDROcodone-acetaminophen (NORCO) 7.5-325 MG tablet 06/22/2023 30 180 tablet [name], Pearletha Furl, MD [facility] [place] -...  HYDROcodone-acetaminophen (NORCO) 7.5-325 MG tablet 05/25/2023 30 180 tablet [name], Pearletha Furl, MD [facility] [place] -...  HYDROCODONE-ACETAMIN 7.5-325 04/24/2023 30 180 each Asa Lente, MD [facility] [phone] - S...  HYDROCODONE-ACETAMIN 7.5-325 03/27/2023 30 180 each Asa Lente, MD [facility] [phone] - S...  HYDROcodone-acetaminophen (NORCO) 7.5-325 MG tablet 02/28/2023 30 180 tablet [name], Pearletha Furl, MD [facility]  -.Marland KitchenMarland Kitchen

## 2024-03-11 ENCOUNTER — Other Ambulatory Visit: Payer: Self-pay | Admitting: Neurology

## 2024-03-11 ENCOUNTER — Other Ambulatory Visit (HOSPITAL_BASED_OUTPATIENT_CLINIC_OR_DEPARTMENT_OTHER): Payer: Self-pay

## 2024-03-11 MED ORDER — METHYLPHENIDATE HCL 20 MG PO TABS
20.0000 mg | ORAL_TABLET | Freq: Three times a day (TID) | ORAL | 0 refills | Status: DC
  Filled 2024-03-14: qty 90, 30d supply, fill #0

## 2024-03-11 NOTE — Telephone Encounter (Signed)
 Last seen on 10/23/23 Follow up scheduled on 05/28/24 Last filled on 02/12/24 #90 tablets (30 day supply) Rx pending to be signed

## 2024-03-13 ENCOUNTER — Ambulatory Visit: Payer: Self-pay

## 2024-03-13 ENCOUNTER — Other Ambulatory Visit (HOSPITAL_BASED_OUTPATIENT_CLINIC_OR_DEPARTMENT_OTHER): Payer: Self-pay

## 2024-03-13 ENCOUNTER — Other Ambulatory Visit (HOSPITAL_COMMUNITY): Payer: Self-pay

## 2024-03-13 NOTE — Telephone Encounter (Signed)
 Copied from CRM [id number]. Topic: Clinical - Red Word Triage >> Mar 13, 2024  1:31 PM [name] wrote: [facility] that prompted transfer to Nurse Triage: burning with urination, possible UTI, IBS    Chief Complaint: Urinary burning, lower abdominal pain, groin pain. Symptoms: Above Frequency: 2 weeks Pertinent Negatives: Patient denies  Disposition: [] ED /[] Urgent Care (no appt availability in office) / [x] Appointment(In office/virtual)/ []  [place] [facility]/ [] Home Care/ [] Refused Recommended Disposition /[] [place] [facility]/ []  Follow-up with PCP Additional Notes: Agrees with appoinment  Reason for Disposition  Urinating more frequently than usual (i.e., frequency)  Answer Assessment - Initial Assessment Questions 1. SYMPTOM: "What's the main symptom you're concerned about?" (e.g., frequency, incontinence)     Burning, cramping 2. ONSET: "When did the    start?"     2 weeks ago 3. PAIN: "Is there any pain?" If Yes, ask: "How bad is it?" (Scale: 1-10; mild, moderate, severe)     Now - 5-6 4. CAUSE: "What do you think is causing the symptoms?"     Maybe UTI 5. OTHER SYMPTOMS: "Do you have any other symptoms?" (e.g., blood in urine, fever, flank pain, pain with urination)     Pain in groin 6. PREGNANCY: "Is there any chance you are pregnant?" "When was your last menstrual period?"     No  Protocols used: Urinary Symptoms-A-AH

## 2024-03-14 ENCOUNTER — Ambulatory Visit (INDEPENDENT_AMBULATORY_CARE_PROVIDER_SITE_OTHER): Admitting: Internal Medicine

## 2024-03-14 ENCOUNTER — Other Ambulatory Visit: Payer: Self-pay

## 2024-03-14 ENCOUNTER — Other Ambulatory Visit (HOSPITAL_BASED_OUTPATIENT_CLINIC_OR_DEPARTMENT_OTHER): Payer: Self-pay

## 2024-03-14 VITALS — BP 158/98 | HR 80 | Temp 99.5°F | Ht 65.0 in | Wt 157.4 lb

## 2024-03-14 DIAGNOSIS — F4320 Adjustment disorder, unspecified: Secondary | ICD-10-CM | POA: Diagnosis not present

## 2024-03-14 DIAGNOSIS — G35 Multiple sclerosis: Secondary | ICD-10-CM | POA: Diagnosis not present

## 2024-03-14 DIAGNOSIS — R300 Dysuria: Secondary | ICD-10-CM | POA: Diagnosis not present

## 2024-03-14 LAB — POCT URINALYSIS DIP (MANUAL ENTRY)
Bilirubin, UA: NEGATIVE
Blood, UA: NEGATIVE
Glucose, UA: NEGATIVE mg/dL
Ketones, POC UA: NEGATIVE mg/dL
Leukocytes, UA: NEGATIVE
Nitrite, UA: NEGATIVE
Protein Ur, POC: NEGATIVE mg/dL
Spec Grav, UA: 1.015 (ref 1.010–1.025)
Urobilinogen, UA: 0.2 U/dL
pH, UA: 6 (ref 5.0–8.0)

## 2024-03-14 MED ORDER — CIPROFLOXACIN HCL 250 MG PO TABS: 250.0000 mg | ORAL_TABLET | Freq: Two times a day (BID) | ORAL | 0 refills | Status: DC

## 2024-03-14 NOTE — Progress Notes (Signed)
 Patient Care Team: Sylvan Evener, MD as PCP - General (Internal Medicine) Sheryle Donning, MD as PCP - Cardiology (Cardiology)  Visit Date: 03/14/24  Subjective:   Chief Complaint  Patient presents with   Urinary Tract Infection   Urination Burning    Patient states she has pain and pressure in lower abdomen as well as crotch   BP Readings from Last 1 Encounters:  03/14/24 (!) 158/98   Patient [name] R Geiselman,Female DOB:February 27, 1961,63 y.o. VWU:[id number]   63 y.o. Female presents today for acute sick visit with Burning in her Abdomen/Bladder. Patient has a past medical history of MS; Bladder Incontinence; and IBS. She says that for the past 3-4 days her stools have been darkened and green, so she had started taking her Hyoscyamine  0.125 mg every 4 hours as needed because she has also been having pain and pressure in her lower abdomen but can't tell whether it is due to her bladder or colon. She will be due for repeat Colo-guard in November, last done in 2021 was NEGATIVE. She also says that her legs have been swelling.  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 -- CAUSES LYMPHEDEMA   Other Swelling and Other (See Comments)    Laxatives and artificial sweeteners- cause leg swelling   Vitamin D   Analogs Palpitations and Other (See Comments)    Cannot tolerate at 50,000 units OR even 2,000 units    Family History  Problem Relation Age of Onset   Diabetes Mother    Hypertension Mother    Stroke Mother    Bowel Disease Brother    Diabetes Brother    Healthy Brother    Emphysema Father    Heart attack Father    Social History   Social History Narrative   Not on file  Her husband has passed recently. 2 adult children, 5 grandchildren. She still has 1 son living with her to assist her, he is working on recovering from alcohol dependence.   Review of Systems  Cardiovascular:  Positive for leg swelling.  Gastrointestinal:  Positive for abdominal pain (burning).  Genitourinary:        (+) Pelvic/Bladder Pain     Objective:  Vitals: BP (!) 158/98   Pulse 80   Temp 99.5 F (37.5 C) (Temporal)   Ht 5\' 5"  (1.651 m)   Wt 157 lb 6.4 oz (71.4 kg)   SpO2 95%   BMI 26.19 kg/m   Physical Exam Vitals and nursing note reviewed.  Constitutional:      General: She is not in acute distress.    Appearance: Normal appearance. She is  not toxic-appearing.  HENT:     Head: Normocephalic and atraumatic.  Pulmonary:     Effort: Pulmonary effort is normal.  Skin:    General: Skin is warm and dry.  Neurological:     Mental Status: She is alert and oriented to person, place, and time. Mental status is at baseline.  Psychiatric:        Mood and Affect: Mood normal.        Behavior: Behavior normal.        Thought Content: Thought content normal.        Judgment: Judgment normal.    Results:  Studies Obtained And Personally Reviewed By Me:  Colo-guard 2021 NEGATIVE  Labs:     Component Value Date/Time   NA 140 12/07/2022 1349   K 4.4 12/07/2022 1349   CL 100 12/07/2022 1349   CO2 26 12/07/2022 1349   GLUCOSE 94 12/07/2022 1349   GLUCOSE 88 08/17/2022 1234   BUN 14 12/07/2022 1349   CREATININE 0.95 12/07/2022 1349   CREATININE 0.84 07/08/2022 0930   CALCIUM 9.4 12/07/2022  1349   PROT 7.0 12/07/2022 1349   ALBUMIN 4.4 12/07/2022 1349   AST 26 12/07/2022 1349   ALT 26 12/07/2022 1349   ALKPHOS 109 12/07/2022 1349   BILITOT 0.6 12/07/2022 1349   GFRNONAA >60 08/17/2022 1234   GFRNONAA 86 09/15/2020 1120   GFRAA 100 09/15/2020 1120    Lab Results  Component Value Date   WBC 4.2 12/07/2022   HGB 15.2 12/07/2022   HCT 46.0 12/07/2022   MCV 90 12/07/2022   PLT 264 12/07/2022   Lab Results  Component Value Date   CHOL 184 07/08/2022   HDL 89 07/08/2022   LDLCALC 80 07/08/2022   TRIG 71 07/08/2022   CHOLHDL 2.1 07/08/2022   Lab Results  Component Value Date   HGBA1C  03/10/2010    5.5 (NOTE) The ADA recommends the following therapeutic goal for glycemic control related to Hgb A1c measurement: Goal of therapy: <6.5 Hgb A1c  Reference: American Diabetes Association: Clinical Practice Recommendations 2010, Diabetes Care, 2010, 33: (Suppl  1).    Lab Results  Component Value Date   TSH 1.63 12/09/2022    Results for orders placed or performed in visit on 03/14/24  POCT urinalysis dipstick  Result Value Ref Range   Color, UA yellow yellow   Clarity, UA clear clear   Glucose, UA negative negative mg/dL   Bilirubin, UA negative negative   Ketones, POC UA negative negative mg/dL   Spec Grav, UA 1.610 9.604 - 1.025   Blood, UA negative negative   pH, UA 6.0 5.0 - 8.0   Protein Ur, POC negative negative mg/dL   Urobilinogen, UA 0.2 0.2 or 1.0 E.U./dL   Nitrite, UA Negative Negative   Leukocytes, UA Negative Negative   Assessment & Plan:    Suspected UTI- has suprapubic discomfort- patient wants to go  which she take Cipro  250 mg tabs twice daily x 7 days.  Grief Reaction; Situation Stress due to her husband's passing. Her situation with her children has improved. She is tearful in the office today and is grieving loss of husband. Family has been supportive,  Multiple Sclerosis followed by Dr. Godwin Lat at [facility]    I,Emily  Lagle,acting as a scribe for Sylvan Evener, MD.,have documented all relevant documentation on the behalf of Sylvan Evener, MD,as directed by  Sylvan Evener, MD while in the presence of Jaynie Meyers  Liane Redman, MD.   Raynard Callander, MD, have reviewed all documentation for this visit. The documentation on 03/24/24 for the exam, diagnosis, procedures, and orders are all accurate and complete.

## 2024-03-20 ENCOUNTER — Encounter: Payer: Self-pay | Admitting: *Deleted

## 2024-03-20 DIAGNOSIS — Z0289 Encounter for other administrative examinations: Secondary | ICD-10-CM

## 2024-03-24 ENCOUNTER — Encounter: Payer: Self-pay | Admitting: Internal Medicine

## 2024-03-24 NOTE — Patient Instructions (Signed)
 We are sending in Cipro  250 mg twice daily for 7 days for urinary symptoms. Your urine is being cultured. Please take care of yourself and call if you need anything at all. We offer our condolences in the loss of your husband.

## 2024-03-26 ENCOUNTER — Telehealth: Payer: Self-pay | Admitting: *Deleted

## 2024-03-26 NOTE — Telephone Encounter (Signed)
 Gave completed/signed form back to medical records to process for pt.

## 2024-03-26 NOTE — Telephone Encounter (Signed)
 Pt [employer] form faxed on 03/26/2024 to [phone]

## 2024-04-01 ENCOUNTER — Telehealth: Payer: Self-pay | Admitting: Internal Medicine

## 2024-04-01 NOTE — Telephone Encounter (Signed)
 I tried to call patient about Disability forms we received, Dr Liane Redman wants to know what its for. I called and lvm for patient to call back

## 2024-04-08 ENCOUNTER — Telehealth: Payer: Self-pay

## 2024-04-08 ENCOUNTER — Other Ambulatory Visit: Payer: Self-pay | Admitting: Neurology

## 2024-04-08 NOTE — Telephone Encounter (Signed)
 Copied from CRM [id number]. Topic: General - Other >> Apr 01, 2024  3:52 PM Jeris Montes S wrote: Reason for CRM: Pt returned call- Needing the disability forms filled out to show insurance company [employer] that patient is disabled since 2008 with MS and not able to work at all due to condition then the form needs to be faxed back to company listed on forms. Pt asked for cb if need any additional information.

## 2024-04-09 ENCOUNTER — Other Ambulatory Visit (HOSPITAL_BASED_OUTPATIENT_CLINIC_OR_DEPARTMENT_OTHER): Payer: Self-pay

## 2024-04-09 MED ORDER — METHYLPHENIDATE HCL 20 MG PO TABS
20.0000 mg | ORAL_TABLET | Freq: Three times a day (TID) | ORAL | 0 refills | Status: DC
  Filled 2024-04-09 – 2024-04-15 (×2): qty 90, 30d supply, fill #0

## 2024-04-09 MED ORDER — HYDROCODONE-ACETAMINOPHEN 7.5-325 MG PO TABS: 2.0000 | ORAL_TABLET | Freq: Three times a day (TID) | ORAL | 0 refills | Status: DC | PRN

## 2024-04-09 NOTE — Telephone Encounter (Signed)
 Last on 10/23/23 Follow up scheduled on 05/28/24   Dispensed Days Supply Quantity Provider Pharmacy  HYDROCODONE -ACETAMIN [phone] 03/11/2024 30 180 each Jorie Newness, MD [facility] [phone] - S...     Rx pending to be signed

## 2024-04-09 NOTE — Telephone Encounter (Signed)
 Last seen on 10/23/23 Follow up scheduled on 05/28/24   Dispensed Days Supply Quantity Provider Pharmacy  methylphenidate  (RITALIN ) 20 MG tablet 03/14/2024 30 90 tablet [name], Sherida Dimmer, MD [facility]     Rx pending to signed

## 2024-04-15 ENCOUNTER — Other Ambulatory Visit (HOSPITAL_BASED_OUTPATIENT_CLINIC_OR_DEPARTMENT_OTHER): Payer: Self-pay

## 2024-05-02 ENCOUNTER — Other Ambulatory Visit: Payer: Self-pay | Admitting: Neurology

## 2024-05-02 ENCOUNTER — Encounter: Payer: Self-pay | Admitting: Neurology

## 2024-05-03 ENCOUNTER — Other Ambulatory Visit (HOSPITAL_BASED_OUTPATIENT_CLINIC_OR_DEPARTMENT_OTHER): Payer: Self-pay

## 2024-05-03 ENCOUNTER — Other Ambulatory Visit: Payer: Self-pay | Admitting: *Deleted

## 2024-05-03 MED ORDER — METHYLPHENIDATE HCL 20 MG PO TABS
20.0000 mg | ORAL_TABLET | Freq: Three times a day (TID) | ORAL | 0 refills | Status: DC
  Filled 2024-05-03 – 2024-05-13 (×2): qty 90, 30d supply, fill #0

## 2024-05-03 MED ORDER — HYDROCODONE-ACETAMINOPHEN 7.5-325 MG PO TABS
2.0000 | ORAL_TABLET | Freq: Three times a day (TID) | ORAL | 0 refills | Status: DC | PRN
  Filled 2024-05-03: qty 180, 30d supply, fill #0

## 2024-05-03 NOTE — Telephone Encounter (Signed)
 Last seen 10/23/23 and next f/u 05/28/24. Last refilled 04/15/24 #90.

## 2024-05-03 NOTE — Telephone Encounter (Signed)
 Last seen 10/23/23 and next f/u 05/28/24. Last refilled 04/11/24 #180. To be filled on or after 05/09/24.

## 2024-05-07 ENCOUNTER — Other Ambulatory Visit (HOSPITAL_BASED_OUTPATIENT_CLINIC_OR_DEPARTMENT_OTHER): Payer: Self-pay

## 2024-05-07 MED ORDER — HYDROCODONE-ACETAMINOPHEN 7.5-325 MG PO TABS: 2.0000 | ORAL_TABLET | Freq: Three times a day (TID) | ORAL | 0 refills | Status: DC | PRN

## 2024-05-07 NOTE — Telephone Encounter (Signed)
 Called pharmacy at [phone]. LVM asking they cx rx hydrocodone  sent 05/03/24. Sent in error.

## 2024-05-07 NOTE — Addendum Note (Signed)
 Addended by: Oneita Bihari on: 05/07/2024 07:40 AM   Modules accepted: Orders

## 2024-05-13 ENCOUNTER — Other Ambulatory Visit (HOSPITAL_BASED_OUTPATIENT_CLINIC_OR_DEPARTMENT_OTHER): Payer: Self-pay

## 2024-05-13 ENCOUNTER — Telehealth: Payer: Self-pay | Admitting: Family Medicine

## 2024-05-13 NOTE — Telephone Encounter (Signed)
 Last seen on 10/23/23 Follow up scheduled on 05/28/24  Pt had Rx that was sent on 05/03/24 that has been on hold the I called [facility] and they will get Rx filled for patient.

## 2024-05-13 NOTE — Telephone Encounter (Signed)
 Pt is requesting a refill for methylphenidate  (RITALIN ) 20 MG tablet .  Pharmacy: [facility] Pt asking if this can be filled tomorrow, she is trying to go out of town and would need this done by tomorrow

## 2024-05-28 ENCOUNTER — Encounter: Payer: Self-pay | Admitting: Neurology

## 2024-05-28 ENCOUNTER — Other Ambulatory Visit (HOSPITAL_BASED_OUTPATIENT_CLINIC_OR_DEPARTMENT_OTHER): Payer: Self-pay

## 2024-05-28 ENCOUNTER — Ambulatory Visit: Payer: 59 | Admitting: Neurology

## 2024-05-28 ENCOUNTER — Telehealth: Payer: Self-pay | Admitting: Neurology

## 2024-05-28 VITALS — BP 159/83 | HR 80 | Ht 65.0 in | Wt 157.0 lb

## 2024-05-28 DIAGNOSIS — M545 Low back pain, unspecified: Secondary | ICD-10-CM

## 2024-05-28 DIAGNOSIS — R5382 Chronic fatigue, unspecified: Secondary | ICD-10-CM

## 2024-05-28 DIAGNOSIS — R269 Unspecified abnormalities of gait and mobility: Secondary | ICD-10-CM

## 2024-05-28 DIAGNOSIS — E538 Deficiency of other specified B group vitamins: Secondary | ICD-10-CM

## 2024-05-28 DIAGNOSIS — Z79891 Long term (current) use of opiate analgesic: Secondary | ICD-10-CM

## 2024-05-28 DIAGNOSIS — G35 Multiple sclerosis: Secondary | ICD-10-CM

## 2024-05-28 DIAGNOSIS — G8929 Other chronic pain: Secondary | ICD-10-CM

## 2024-05-28 MED ORDER — SYRINGE 23G X 1" 3 ML MISC: 3.0000 | 4 refills | Status: AC

## 2024-05-28 MED ORDER — METHYLPHENIDATE HCL 20 MG PO TABS
20.0000 mg | ORAL_TABLET | Freq: Three times a day (TID) | ORAL | 0 refills | Status: DC
Start: 2024-06-12 — End: 2024-07-08
  Filled 2024-05-28 – 2024-06-12 (×2): qty 90, 30d supply, fill #0

## 2024-05-28 MED ORDER — HYDROCODONE-ACETAMINOPHEN 7.5-325 MG PO TABS: ORAL_TABLET | ORAL | 0 refills | Status: DC

## 2024-05-28 MED ORDER — CYANOCOBALAMIN 1000 MCG/ML IJ SOLN: INTRAMUSCULAR | 11 refills | Status: AC

## 2024-05-28 MED ORDER — METHYLPHENIDATE HCL 20 MG PO TABS: 20.0000 mg | ORAL_TABLET | Freq: Three times a day (TID) | ORAL | 0 refills | Status: DC

## 2024-05-28 NOTE — Progress Notes (Signed)
 [facility]  PATIENT: [name] DOB: 1961-05-16 REASON FOR VISIT: Multiple sclerosis   HISTORICAL  CHIEF COMPLAINT:  Chief Complaint  Patient presents with   RM11/MS    Pt is here Alone. Pt states that this week hasn't been too good with her MS due to the heat and fatigue. Pt states that she had fallen last week. Pt stares that she has lost her husband in Jan.     HISTORY OF PRESENT ILLNESS:  [name] is a 63 y.o. woman with relapsing remitting multiple sclerosis diagnosed in 2008.    Update 05/28/2024 She is noting more fatigue   Temperatures have been higher which has always affected her.    Her husband passed away since the last visit..   She also notes.  For fatigue, she takes.  Ritalin  helps her ADD and fatigue.    She also tried Adderall 20 mg po tid and felt it may have worked a little better than methylphenidate  but caused HA.     She stopped the Tecfidera  after her imaging in February 2023 and inotes no new lesions  No exacerbations.   She still has a lot of symptoms  . She feels her MS is stable.     Her gait has continued to worsen ad she uses the walker more.    Her right leg is weaker than left and she has a foot drop at times.  She tires out very easily.   She wakes up to urinate 3-4 times a night which is better.    She wears a Poise pad due to occasional incontinence..    She usually falls back asleep easily.   She denies snoring.   She feels less depressed off any antidepressant.   She rarely takes a single xanax  0.25 mg at night for bad insomnia but has a hangover the next day.  She was evaluated by rheumatology due to more knee, spine and hip pain.  She was told she does not have RA and was told she has fibromyalgia.    She needs a TKR on the right.    She has LBP and spasms in back and legs, and knee pain, helped by hydrocodone  (she takes 6 x 7.5 mg daily).  Gabapentin  (even 300 mg) causes sleepiness and was not tolerated.   She is  compliant (PDMP has been checked).  She has not shown any drug-seeking behavior.  Hhdrocodone has helped her pain the most.   When pain flares, she feels her walking does worse.     Sometimes has neck pain and occipital pain.    Thoracic pain has been worse recently.      Vit D 50000 units causes heart racing but lower dose daily is tolerated.       Vit B12 was also low in the past.        MS History:  In 2008, she had an MRI of the brain performed which was consistent with multiple sclerosis and she was diagnosed with relapsing remitting MS. He was initially placed on Avonex for therapy. She had some exacerbations with weakness and clumsiness with her gait and she started to see Dr. Juliane. He placed her on Tysabri she is on Tysabri between 2010 and 2013. 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   Imaging Review: 12/13/17 MRI of the cervical and thoracic spine:  She has multiple T2 hyperintense lesions in the spinal cord the largest to the right C2-C3 and smaller ones at C3-C4, C4, C5-C6, C6-C7 and T1-T2 all the foci were present compared to the 2016 MRI.   The MRI of the thoracic spine showed small foci adjacent to T2 and T5-6.  None of the spinal plaques appeared to be acute.    MRI 02/25/2020 brain:   Multiple T2/FLAIR hyperintense foci in the hemispheres in a pattern and configuration consistent with chronic demyelinating plaque associated with multiple sclerosis.  None of the foci appears to be acute.  Compared to the MRI from 03/18/2017, there is no interval change.   MRI 2016, 2017 and 2018 showed no new lesions  MRI of the brain 05/19/2022 showed no new lesions.  MRI of the thoracic spine 05/29/2022 showed no new lesions.  MRI of the lumbar spine 05/29/2022 showed At L3-L4, there is mild to moderate spinal stenosis due to disc protrusion and other degenerative change.  Additionally there is moderate  right foraminal narrowing and moderate bilateral lateral recess stenosis.  There does not appear to be nerve root compression.   Milder degenerative changes at other lumbar levels not causing spinal stenosis or nerve root compression.  MRI brain 06/03/2023 showed T2/FLAIR hyperintense foci in the hemispheres and a couple foci in the cerebellum consistent with chronic demyelinating plaque associated with multiple sclerosis. None of the foci appear to be acute. They do not enhance. Compared to the MRI from 12/12/2022, there were no new lesions.   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   REVIEW OF SYSTEMS:  Constitutional: No fevers, chills, sweats, or change in appetite.  She notes fatigue and attention deficit Eyes: No visual changes,eye pain.   She has intermittent diplopia Ear, nose and throat: No hearing loss, ear pain, nasal congestion, sore throat Cardiovascular: No chest pain, palpitations Respiratory:  No shortness of breath at rest or with exertion.   No wheezes GastrointestinaI: No nausea, vomiting, diarrhea, abdominal pain, fecal incontinence Genitourinary:  a sabove Musculoskeletal:  she has both neck pain and back pain. Also bilat knee pain Integumentary: No rash, pruritus, skin lesions.   She has Raynauds in her hands  Neurological: as above Psychiatric: No depression at this time.  Anxiety doing ok on med's Endocrine: No palpitations, diaphoresis, change in appetite, change in weigh or increased thirst Hematologic/Lymphatic:  No anemia, purpura, petechiae. Allergic/Immunologic: No itchy/runny eyes, nasal congestion, recent allergic reactions, rashes  ALLERGIES: Allergies  Allergen Reactions   Ibuprofen Other (See Comments)    CAUSES LYMPHEDEMA   Nsaids Other (See Comments)    AVOIDS ANY SODIUM-CONTAINING MED. -- CAUSES LYMPHEDEMA    Other Swelling and Other (See Comments)    Laxatives and artificial sweeteners- cause leg swelling   Vitamin D  Analogs Palpitations and Other (See Comments)    Cannot tolerate at 50,000 units OR even 2,000 units    HOME MEDICATIONS: Outpatient Medications Prior to Visit  Medication Sig Dispense Refill   hyoscyamine  (LEVSIN  SL) 0.125 MG SL tablet Place 1 tablet (0.125 mg total) under the tongue every 4 (four) hours as needed. 270 tablet 0   metoprolol  tartrate (LOPRESSOR ) 50 MG tablet TAKE 1/2 TABLET BY MOUTH EVERY 12 HOURS AS NEEDED FOR PALPITATIONS 30 tablet 5   cyanocobalamin  (VITAMIN B12) 1000 MCG/ML injection INJECT 1 ML TWICE PER MONTH 2 mL 11  methylphenidate  (RITALIN ) 20 MG tablet Take 1 tablet (20 mg total) by mouth 3 (three) times daily with meals. 90 tablet 0   Syringe/Needle, Disp, (SYRINGE 3CC/23GX1) 23G X 1 3 ML MISC 3 Syringes by Does not apply route every 30 (thirty) days. (Patient taking differently: 3 Syringes by Does not apply route every 30 (thirty) days. 26Gx5/6) 3 each 3   ALPRAZolam  (XANAX ) 0.25 MG tablet Take 1 tablet (0.25 mg total) by mouth at bedtime as needed for anxiety. (Patient not taking: Reported on 05/28/2024) 90 tablet 1   ciprofloxacin  (CIPRO ) 250 MG tablet Take 1 tablet (250 mg total) by mouth 2 (two) times daily. (Patient not taking: Reported on 05/28/2024) 14 tablet 0   HYDROcodone -acetaminophen  (NORCO) 7.5-325 MG tablet Take 2 tablets by mouth every 8 (eight) hours as needed. (Patient not taking: Reported on 05/28/2024) 180 tablet 0   methocarbamol  (ROBAXIN ) 500 MG tablet Take 1 tablet (500 mg total) by mouth 3 (three) times daily. (Patient not taking: Reported on 05/28/2024) 90 tablet 5   HYDROcodone -acetaminophen  (NORCO) 7.5-325 MG tablet Take 2 tablets by mouth every 8 (eight) hours as needed. (Patient not taking: Reported on 05/28/2024) 180 tablet 0   No facility-administered medications prior to visit.    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   Headache(784.0)    History of concussion YOUNG ADULT--  NO RESIDUAL   IBS (irritable bowel syndrome)    Movement disorder    MS (multiple sclerosis) (HCC)    MVP (mitral valve prolapse) HX HEART RACING  YRS AGO   ASYMPTOMATIC SINCE THEN   Vision abnormalities    Weakness of right leg SECONDARY TO MS    PAST SURGICAL HISTORY: Past Surgical History:  Procedure Laterality Date   CESAREAN SECTION  X3   BILATERAL TUBAL LIGATION W/ LAST ONE   DESTRUCTION OF ANAL CONDYLOMA  07-07-2005  DR TOTH   HERNIA REPAIR     KNEE ARTHROSCOPY WITH LATERAL MENISECTOMY  10/26/2012   Procedure: KNEE ARTHROSCOPY WITH LATERAL MENISECTOMY;  Surgeon: Reyes JAYSON Billing, MD;  Location: [facility];  Service: Orthopedics;  Laterality: Left;  Left Knee Arthrsocopy with Partial Lateral Menisectomy   LAPAROSCOPIC ASSISTED VAGINAL HYSTERECTOMY  01-15-2002  DR [name] HOLLAND/ DR TIM DAVIS   AND REPAIR VENTRAL HERNIA   LASER ABLATION CONDOLAMATA N/A 04/26/2013   Procedure: LASER ABLATION CONDOLAMATA;  Surgeon: Bernarda Ned, MD;  Location: [facility];  Service: General;  Laterality: N/A;   TONSILLECTOMY     TOTAL KNEE ARTHROPLASTY Left 11/05/2015   Procedure: LEFT TOTAL KNEE ARTHROPLASTY;  Surgeon: Reyes Billing, MD;  Location: [facility];  Service: Orthopedics;  Laterality: Left;    FAMILY HISTORY: Family History  Problem Relation Age of Onset   Diabetes Mother    Hypertension Mother    Stroke Mother    Bowel Disease Brother    Diabetes Brother    Healthy Brother    Emphysema Father    Heart attack Father     SOCIAL HISTORY:  Social History   Socioeconomic History   Marital status: Widowed    Spouse name: Not on file  Number of  children: 3   Years of education: Not on file   Highest education level: Not on file  Occupational History   Not on file  Tobacco Use   Smoking status: Never   Smokeless tobacco: Never  Substance and Sexual Activity   Alcohol use: No   Drug use: No   Sexual activity: Not on file  Other Topics Concern   Not on file  Social History Narrative   Not on file   Social Drivers of Health   Financial Resource Strain: Medium Risk (03/14/2024)   Overall Financial Resource Strain (CARDIA)    Difficulty of Paying Living Expenses: Somewhat hard  Food Insecurity: No Food Insecurity (07/03/2023)   Hunger Vital Sign    Worried About Running Out of Food in the Last Year: Never true    Ran Out of Food in the Last Year: Never true  Transportation Needs: No Transportation Needs (07/03/2023)   PRAPARE - Administrator, Civil Service (Medical): No    Lack of Transportation (Non-Medical): No  Physical Activity: Inactive (07/03/2023)   Exercise Vital Sign    Days of Exercise per Week: 0 days    Minutes of Exercise per Session: 0 min  Stress: No Stress Concern Present (03/14/2024)   [employer] of Occupational Health - Occupational Stress Questionnaire    Feeling of Stress : Only a little  Social Connections: Moderately Integrated (03/14/2024)   Social Connection and Isolation Panel    Frequency of Communication with Friends and Family: More than three times a week    Frequency of Social Gatherings with Friends and Family: More than three times a week    Attends Religious Services: More than 4 times per year    Active Member of [employer] or Organizations: Yes    Attends Banker Meetings: More than 4 times per year    Marital Status: Widowed  Intimate Partner Violence: Not At Risk (03/14/2024)   Humiliation, Afraid, Rape, and Kick questionnaire    Fear of Current or Ex-Partner: No    Emotionally Abused: No    Physically Abused: No    Sexually Abused: No     PHYSICAL  EXAM  Vitals:   05/28/24 1321  BP: (!) 159/83  Pulse: 80  Weight: 157 lb (71.2 kg)  Height: 5' 5 (1.651 m)    Body mass index is 26.13 kg/m.   General: The patient is well-developed and well-nourished and in no acute distress.  Some back tenderness.  She has no rashes or edema.   Neurologic Exam  Mental status: The patient is alert and oriented x 3 at the time of the examination. The patient has apparent normal recent and remote memory, with an apparently normal attention span and concentration ability.   Speech is normal.  Cranial nerves: The extraocular muscles are intact.  Normal facial strength.    No dysarthria. Hearing appears normal.  Motor:  Muscle bulk is normal. Tone is mildly increased in her legs.. Strength is 5/5  Coordination: Cerebellar testing shows mild right reduced heel to shin.  Finger to nose was normal today  Gait and station: Station is normal. Gait is wide.   Tandem gait is poor.   .   The Romberg sign is borderline.  Reflexes: Deep tendon reflexes are symmetric and brisk bilaterally with crossed abductors at the knees, worse on right. No ankle clonus.SABRA        DIAGNOSTIC DATA (LABS, IMAGING, TESTING) - I reviewed patient records,  labs, notes, testing and imaging myself where available.  Lab Results  Component Value Date   WBC 4.2 12/07/2022   HGB 15.2 12/07/2022   HCT 46.0 12/07/2022   MCV 90 12/07/2022   PLT 264 12/07/2022      Component Value Date/Time   NA 140 12/07/2022 1349   K 4.4 12/07/2022 1349   CL 100 12/07/2022 1349   CO2 26 12/07/2022 1349   GLUCOSE 94 12/07/2022 1349   GLUCOSE 88 08/17/2022 1234   BUN 14 12/07/2022 1349   CREATININE 0.95 12/07/2022 1349   CREATININE 0.84 07/08/2022 0930   CALCIUM 9.4 12/07/2022 1349   PROT 7.0 12/07/2022 1349   ALBUMIN 4.4 12/07/2022 1349   AST 26 12/07/2022 1349   ALT 26 12/07/2022 1349   ALKPHOS 109 12/07/2022 1349   BILITOT 0.6 12/07/2022 1349   GFRNONAA >60 08/17/2022 1234    GFRNONAA 86 09/15/2020 1120   GFRAA 100 09/15/2020 1120   Lab Results  Component Value Date   CHOL 184 07/08/2022   HDL 89 07/08/2022   LDLCALC 80 07/08/2022   TRIG 71 07/08/2022   CHOLHDL 2.1 07/08/2022   Lab Results  Component Value Date   HGBA1C  03/10/2010    5.5 (NOTE) The ADA recommends the following therapeutic goal for glycemic control related to Hgb A1c measurement: Goal of therapy: <6.5 Hgb A1c  Reference: American Diabetes Association: Clinical Practice Recommendations 2010, Diabetes Care, 2010, 33: (Suppl  1).      ASSESSMENT AND PLAN    1. Multiple sclerosis (HCC)   2. Abnormality of gait   3. Chronic prescription opiate use   4. Chronic midline low back pain without sciatica   5. Chronic fatigue   6. Gait disturbance       1.  She is not on a DMT (last on Tecfidera  stopped 2021).   MRI 05/2023 was stable. Check MRI of the brain c/s to see if progression and restart a DMT if occurring (we have discussed restarting Tecfidera  or switch to  [facility] if occurring) 2.  Continue methylphenidate  for attention deficit and fatigue.  She feels the brand name is much more effective than the generic but insurance no longer covers it. 3.   She continues to experience back and right greater than left leg pain.She has multilevel degenerative changes.  She had borderline spinal stenosis at L3-L4 and moderate foraminal narrowing at a couple of the levels.  She also has scoliosis.  4.  continue hydrocodone  for spine and spasm pain but I am going to try to have her reduce to 5 times a day (from 6 pills) and hopefully down to 4 a day eventually.  The [place]  controlled substance database was reviewed and she has been compliant.  She has not shown any drug-seeking behavior.  We discussed not taking more than the prescribed dose due to increased risk.      Will check UDS today 4.  Follow-up in 4 months or sooner if there are new or worsening neurologic symptoms.    Rogerio Boutelle A.  Vear, MD, PhD 05/28/2024, 3:27 PM Certified in Neurology, Clinical Neurophysiology, Sleep Medicine, Pain Medicine and Neuroimaging  [facility] [address] [place], [place] [address] [phone] ]

## 2024-05-28 NOTE — Telephone Encounter (Signed)
 no auth required sent to GI [phone]

## 2024-05-29 LAB — DRUG SCREEN 764883 11+OXYCO+ALC+CRT-BUND

## 2024-05-31 LAB — DRUG SCREEN 764883 11+OXYCO+ALC+CRT-BUND: Hydrocodone: 228.3 mg/dL (ref 20.0–300.0)

## 2024-05-31 LAB — OPIATES CONFIRMATION, URINE
Codeine: NEGATIVE
Hydrocodone Confirm: 2814 ng/mL
Hydrocodone: POSITIVE — AB
Hydromorphone Confirm: 655 ng/mL
Hydromorphone: POSITIVE — AB
Morphine: NEGATIVE
Opiates: POSITIVE ng/mL — AB

## 2024-06-12 ENCOUNTER — Other Ambulatory Visit (HOSPITAL_BASED_OUTPATIENT_CLINIC_OR_DEPARTMENT_OTHER): Payer: Self-pay

## 2024-06-20 ENCOUNTER — Encounter: Payer: Self-pay | Admitting: Neurology

## 2024-07-08 ENCOUNTER — Other Ambulatory Visit: Payer: Self-pay | Admitting: Neurology

## 2024-07-08 ENCOUNTER — Other Ambulatory Visit (HOSPITAL_BASED_OUTPATIENT_CLINIC_OR_DEPARTMENT_OTHER): Payer: Self-pay

## 2024-07-08 ENCOUNTER — Other Ambulatory Visit: Payer: Self-pay | Admitting: *Deleted

## 2024-07-08 MED ORDER — METHYLPHENIDATE HCL 20 MG PO TABS
20.0000 mg | ORAL_TABLET | Freq: Three times a day (TID) | ORAL | 0 refills | Status: DC
  Filled 2024-07-08 – 2024-07-10 (×3): qty 90, 30d supply, fill #0

## 2024-07-08 MED ORDER — HYDROCODONE-ACETAMINOPHEN 7.5-325 MG PO TABS: ORAL_TABLET | ORAL | 0 refills | Status: DC

## 2024-07-08 NOTE — Telephone Encounter (Signed)
 Pt last seen 05/28/24 and next f/u 11/19/24. Last refilled methylphenidate  06/12/24 #90 at [facility] and hydrocodone  06/10/24 [facility] at [facility] [place].

## 2024-07-08 NOTE — Telephone Encounter (Signed)
 I spoke w/ Dr. Vear who agreed to send in refills early and can post-date for pt to fill both tomorrow. I sent refill requests to Dr. Vear to e-scribe.

## 2024-07-09 ENCOUNTER — Other Ambulatory Visit (HOSPITAL_BASED_OUTPATIENT_CLINIC_OR_DEPARTMENT_OTHER): Payer: Self-pay

## 2024-07-10 ENCOUNTER — Other Ambulatory Visit (HOSPITAL_BASED_OUTPATIENT_CLINIC_OR_DEPARTMENT_OTHER): Payer: Self-pay

## 2024-08-06 ENCOUNTER — Other Ambulatory Visit: Payer: Self-pay | Admitting: Neurology

## 2024-08-06 ENCOUNTER — Other Ambulatory Visit (HOSPITAL_BASED_OUTPATIENT_CLINIC_OR_DEPARTMENT_OTHER): Payer: Self-pay

## 2024-08-07 ENCOUNTER — Other Ambulatory Visit: Payer: Self-pay

## 2024-08-07 ENCOUNTER — Other Ambulatory Visit: Payer: Self-pay | Admitting: *Deleted

## 2024-08-07 ENCOUNTER — Other Ambulatory Visit (HOSPITAL_BASED_OUTPATIENT_CLINIC_OR_DEPARTMENT_OTHER): Payer: Self-pay

## 2024-08-07 MED ORDER — METHYLPHENIDATE HCL 20 MG PO TABS
20.0000 mg | ORAL_TABLET | Freq: Three times a day (TID) | ORAL | 0 refills | Status: DC
  Filled 2024-08-07: qty 90, 30d supply, fill #0

## 2024-08-07 MED ORDER — HYDROCODONE-ACETAMINOPHEN 7.5-325 MG PO TABS: ORAL_TABLET | ORAL | 0 refills | Status: DC

## 2024-08-07 NOTE — Telephone Encounter (Signed)
 Pt sent mychart asking for refill on hydrocodone . Last seen 05/28/24 and next f/u 11/19/24. Last refilled 07/08/24 #150.

## 2024-08-07 NOTE — Telephone Encounter (Signed)
 Last seen on 05/28/24 Follow up scheduled on 11/19/24   Dispensed Days Supply Quantity Provider Pharmacy  methylphenidate  (RITALIN ) 20 MG tablet 07/10/2024 30 90 tablet [name], [name] LABOR, MD [facility] [place] -...     Rx pending to be signed

## 2024-09-04 ENCOUNTER — Other Ambulatory Visit: Payer: Self-pay | Admitting: *Deleted

## 2024-09-04 ENCOUNTER — Other Ambulatory Visit (HOSPITAL_BASED_OUTPATIENT_CLINIC_OR_DEPARTMENT_OTHER): Payer: Self-pay

## 2024-09-04 ENCOUNTER — Other Ambulatory Visit: Payer: Self-pay | Admitting: Neurology

## 2024-09-04 ENCOUNTER — Other Ambulatory Visit: Payer: Self-pay

## 2024-09-04 MED ORDER — HYDROCODONE-ACETAMINOPHEN 7.5-325 MG PO TABS: ORAL_TABLET | ORAL | 0 refills | Status: DC

## 2024-09-04 MED ORDER — METHYLPHENIDATE HCL 20 MG PO TABS
20.0000 mg | ORAL_TABLET | Freq: Three times a day (TID) | ORAL | 0 refills | Status: DC
  Filled 2024-09-04: qty 90, 30d supply, fill #0

## 2024-09-04 NOTE — Telephone Encounter (Signed)
 Dr. Vear patient  Last seen on 05/28/24 Follow up scheduled on 11/19/24    Dispensed Days Supply Quantity Provider Pharmacy  HYDROCODONE -ACETAMIN 7.5-325 08/08/2024 30 150 each [name] LABOR, [phone] [facility] S...   Rx pending to be signed

## 2024-09-04 NOTE — Telephone Encounter (Signed)
 Dr.[name] patient  Last seen on 05/28/24 Follow up scheduled on 11/19/24   Dispensed Days Supply Quantity Provider Pharmacy  methylphenidate  (RITALIN ) 20 MG tablet 08/07/2024 30 90 tablet [name], [name] LABOR, MD [facility] [place] -..     Rx pending to be signed

## 2024-09-05 ENCOUNTER — Other Ambulatory Visit (HOSPITAL_BASED_OUTPATIENT_CLINIC_OR_DEPARTMENT_OTHER): Payer: Self-pay

## 2024-10-01 ENCOUNTER — Other Ambulatory Visit: Payer: Self-pay | Admitting: Neurology

## 2024-10-02 ENCOUNTER — Other Ambulatory Visit (HOSPITAL_BASED_OUTPATIENT_CLINIC_OR_DEPARTMENT_OTHER): Payer: Self-pay

## 2024-10-02 ENCOUNTER — Other Ambulatory Visit: Payer: Self-pay | Admitting: *Deleted

## 2024-10-02 MED ORDER — HYDROCODONE-ACETAMINOPHEN 7.5-325 MG PO TABS: ORAL_TABLET | ORAL | 0 refills | Status: DC

## 2024-10-02 MED ORDER — METHYLPHENIDATE HCL 20 MG PO TABS
20.0000 mg | ORAL_TABLET | Freq: Three times a day (TID) | ORAL | 0 refills | Status: DC
  Filled 2024-10-02 – 2024-10-05 (×2): qty 90, 30d supply, fill #0

## 2024-10-02 NOTE — Telephone Encounter (Signed)
 Last seen on 05/28/24 Follow up scheduled on 11/19/24    Dispensed Days Supply Quantity Provider Pharmacy  methylphenidate  (RITALIN ) 20 MG tablet 09/05/2024 30 90 tablet Buck Saucer, MD [facility] [place] -.     Rx pending to be signed

## 2024-10-02 NOTE — Telephone Encounter (Signed)
 Pt sent mychart asking for refill on hydrocodone . Last seen 05/28/24 and next f/u 11/19/24. Last refilled 09/06/24 #150.

## 2024-10-05 ENCOUNTER — Other Ambulatory Visit (HOSPITAL_BASED_OUTPATIENT_CLINIC_OR_DEPARTMENT_OTHER): Payer: Self-pay

## 2024-10-28 ENCOUNTER — Other Ambulatory Visit: Payer: Self-pay | Admitting: Neurology

## 2024-10-28 ENCOUNTER — Other Ambulatory Visit (HOSPITAL_BASED_OUTPATIENT_CLINIC_OR_DEPARTMENT_OTHER): Payer: Self-pay

## 2024-10-28 MED ORDER — HYDROCODONE-ACETAMINOPHEN 7.5-325 MG PO TABS: ORAL_TABLET | ORAL | 0 refills | Status: DC

## 2024-10-28 MED ORDER — METHYLPHENIDATE HCL 20 MG PO TABS: 20.0000 mg | ORAL_TABLET | Freq: Three times a day (TID) | ORAL | 0 refills | Status: DC

## 2024-10-28 NOTE — Telephone Encounter (Signed)
 Requested Prescriptions   Pending Prescriptions Disp Refills   HYDROcodone -acetaminophen  (NORCO) 7.5-325 MG tablet 150 tablet 0    Sig: One po q4 hrs prn   methylphenidate  (RITALIN ) 20 MG tablet 90 tablet 0    Sig: Take 1 tablet (20 mg total) by mouth 3 (three) times daily with meals.   Last seen 05/28/24 Next appt 11/19/24 Dispenses   Dispensed Days Supply Quantity Provider Pharmacy  methylphenidate  (RITALIN ) 20 MG tablet 10/05/2024 30 90 tablet [name], [name] LABOR, MD [facility] [place] -...  methylphenidate  (RITALIN ) 20 MG tablet 09/05/2024 30 90 tablet Athar, Saima, MD [facility] [place] -...  methylphenidate  (RITALIN ) 20 MG tablet 08/07/2024 30 90 tablet [name], [name] LABOR, MD [facility] [place] -...  methylphenidate  (RITALIN ) 20 MG tablet 07/10/2024 30 90 tablet [name], [name] LABOR, MD [facility] [place] -...  methylphenidate  (RITALIN ) 20 MG tablet 06/12/2024 30 90 tablet [name], [name] LABOR, MD [facility] [place] -...  methylphenidate  (RITALIN ) 20 MG tablet 05/13/2024 30 90 tablet [name], [name] LABOR, MD [facility] [place] -...  methylphenidate  (RITALIN ) 20 MG tablet 04/15/2024 30 90 tablet [name], [name] LABOR, MD [facility] [place] -...  methylphenidate  (RITALIN ) 20 MG tablet 03/14/2024 30 90 tablet [name], [name] LABOR, MD [facility] [place] -...  methylphenidate  (RITALIN ) 20 MG tablet 02/12/2024 30 90 tablet [name], [name] LABOR, MD [facility] [place] -...  methylphenidate  (RITALIN ) 20 MG tablet 01/15/2024 30 90 tablet [name], [name] LABOR, MD [facility] [place] -...  methylphenidate  (RITALIN ) 20 MG tablet 12/16/2023 30 90 tablet [name], [name] LABOR, MD [facility] [place] -...  methylphenidate  (RITALIN ) 20 MG tablet 11/17/2023 30 90 tablet [name], [name] LABOR, MD [facility] [place] -...    Dispenses   Dispensed Days Supply Quantity Provider Pharmacy  HYDROCODONE -ACETAMIN 7.5-325 10/05/2024 25 150 each [name] LABOR, [facility] [phone] - S...  HYDROCODONE -ACETAMIN 7.5-325 09/06/2024  25 150 each Athar, Saima, MD [facility] [phone] - S...  HYDROCODONE -ACETAMIN 7.5-325 08/08/2024 30 150 each [name] LABOR, [facility] [phone] - S...  HYDROCODONE -ACETAMIN 7.5-325 07/08/2024 30 150 each [name] LABOR, [facility] [phone] - S...  HYDROCODONE -ACETAMIN 7.5-325 06/10/2024 30 150 each [name] LABOR, [facility] [phone] - S...  HYDROCODONE -ACETAMIN 7.5-325 05/11/2024 30 180 each [name] LABOR, [facility] [phone] - S...  HYDROCODONE -ACETAMIN 7.5-325 04/11/2024 30 180 each [name] LABOR, [facility] [phone] - S...  HYDROCODONE -ACETAMIN 7.5-325 03/11/2024 30 180 each [name] LABOR, [facility] [phone] - S...  HYDROCODONE -ACETAMIN 7.5-325 02/13/2024 30 180 each [name] LABOR, [facility] [phone] - S...  HYDROCODONE -ACETAMIN 7.5-325 01/11/2024 30 180 each [name] LABOR, [facility] [phone] - S...  HYDROCODONE -ACETAMIN 7.5-325 12/14/2023 30 180 each [name] LABOR, [facility] [phone] - S...  HYDROcodone -acetaminophen  (NORCO) 7.5-325 MG tablet 11/10/2023 30 180 tablet [name], [name] LABOR, MD [facility]  -.SABRASABRA

## 2024-10-28 NOTE — Telephone Encounter (Signed)
 Pt is requesting a refill on her methylphenidate  (RITALIN ) 20 MG tablet to [facility]  and the HYDROcodone -acetaminophen  (NORCO) 7.5-325 [facility] [phone]

## 2024-10-29 NOTE — Telephone Encounter (Signed)
 Pt called stating that her  methylphenidate  (RITALIN ) 20 MG tablet was sent to the wrong pharmacy. It is needing to be sent to the [facility]. Please inform pt when this is fixed.

## 2024-10-30 ENCOUNTER — Other Ambulatory Visit: Payer: Self-pay | Admitting: Neurology

## 2024-10-30 ENCOUNTER — Other Ambulatory Visit (HOSPITAL_BASED_OUTPATIENT_CLINIC_OR_DEPARTMENT_OTHER): Payer: Self-pay

## 2024-10-30 ENCOUNTER — Other Ambulatory Visit: Payer: Self-pay

## 2024-10-30 MED ORDER — METHYLPHENIDATE HCL 20 MG PO TABS
20.0000 mg | ORAL_TABLET | Freq: Three times a day (TID) | ORAL | 0 refills | Status: DC
  Filled 2024-10-30: qty 90, 30d supply, fill #0

## 2024-10-30 NOTE — Telephone Encounter (Signed)
 Requested Prescriptions   Pending Prescriptions Disp Refills   methylphenidate  (RITALIN ) 20 MG tablet 90 tablet 0    Sig: Take 1 tablet (20 mg total) by mouth 3 (three) times daily with meals.   Last seen 05/28/24 Next appt 11/19/24  Dispenses   Dispensed Days Supply Quantity Provider Pharmacy  methylphenidate  (RITALIN ) 20 MG tablet 10/05/2024 30 90 tablet [name], [name] LABOR, MD [facility] [place] -...  methylphenidate  (RITALIN ) 20 MG tablet 09/05/2024 30 90 tablet Athar, Saima, MD [facility] [place] -...  methylphenidate  (RITALIN ) 20 MG tablet 08/07/2024 30 90 tablet [name], [name] LABOR, MD [facility] [place] -...  methylphenidate  (RITALIN ) 20 MG tablet 07/10/2024 30 90 tablet [name], [name] LABOR, MD [facility] [place] -...  methylphenidate  (RITALIN ) 20 MG tablet 06/12/2024 30 90 tablet [name], [name] LABOR, MD [facility] [place] -...  methylphenidate  (RITALIN ) 20 MG tablet 05/13/2024 30 90 tablet [name], [name] LABOR, MD [facility] [place] -...  methylphenidate  (RITALIN ) 20 MG tablet 04/15/2024 30 90 tablet [name], [name] LABOR, MD [facility] [place] -...  methylphenidate  (RITALIN ) 20 MG tablet 03/14/2024 30 90 tablet [name], [name] LABOR, MD [facility] [place] -...  methylphenidate  (RITALIN ) 20 MG tablet 02/12/2024 30 90 tablet [name], [name] LABOR, MD [facility] [place] -...  methylphenidate  (RITALIN ) 20 MG tablet 01/15/2024 30 90 tablet [name], [name] LABOR, MD [facility] [place] -...  methylphenidate  (RITALIN ) 20 MG tablet 12/16/2023 30 90 tablet [name], [name] LABOR, MD [facility] [place] -...  methylphenidate  (RITALIN ) 20 MG tablet 11/17/2023 30 90 tablet [name], [name] LABOR, MD [facility]  -.SABRASABRA

## 2024-11-18 NOTE — Patient Instructions (Signed)

## 2024-11-18 NOTE — Progress Notes (Unsigned)
 No chief complaint on file.   HISTORY OF PRESENT ILLNESS:  11/18/2024 ALL:  [name] is a 63 y.o. female here today for follow up for RRMS. She remains off Tecfidera . Labs have been stable. MR brain stable 05/2023. Repeat imaging ordered 05/2024...  She reports doing well. No new or exacerbating symptoms. She feels gait is stable. She has a cane but does not use it regularly. Pain controlled on hydrocodone  7.5-325mg  two tablets every 8 hours as needed. She usually tries to do 1.5 tablets every 4 hours. She tries to take less when she can. PDMP appropriate. Last filled 10/29/2024. She continues to have chronic low back pain. She is followed by [facility] for right knee pain. Needs replacement. She reports BP is normally 120s/60-70. She is under more stress as her husband is suffering with LBD.   She continues Ritalin  20mg  TID. Last filled 10/30/2024. She takes first dose early in the morning then returns to sleep. She feels she wakes up sharper when doing this. She sleeps well most nights when husband is resting. Alprazolam  0.25mg ??? taken at bedtime as needed for insomnia and anxiety but she has not taken recently in case her husband has a bad night.   Hyoscyamine  as needed helps with urinary frequency/urge incontinence. She feels that she has urinary retention if she takes it daily.   She continues B12 supplements. She reports RX vitamin D  caused heart palpitations. She is tolerating OTC supplement.     HISTORY (copied from Dr Duncan previous note)  [name] is a 63 y.o. woman with relapsing remitting multiple sclerosis diagnosed in 2008.     Update 05/28/2024 She is noting more fatigue   Temperatures have been higher which has always affected her.    Her husband passed away since the last visit..   She also notes.  For fatigue, she takes.  Ritalin  helps her ADD and fatigue.    She also tried Adderall 20 mg po tid and felt it may have worked a little better than  methylphenidate  but caused HA.      She stopped the Tecfidera  after her imaging in February 2023 and inotes no new lesions  No exacerbations.   She still has a lot of symptoms  . She feels her MS is stable.      Her gait has continued to worsen ad she uses the walker more.    Her right leg is weaker than left and she has a foot drop at times.  She tires out very easily.   She wakes up to urinate 3-4 times a night which is better.    She wears a Poise pad due to occasional incontinence..    She usually falls back asleep easily.   She denies snoring.   She feels less depressed off any antidepressant.   She rarely takes a single xanax  0.25 mg at night for bad insomnia but has a hangover the next day.   She was evaluated by rheumatology due to more knee, spine and hip pain.  She was told she does not have RA and was told she has fibromyalgia.    She needs a TKR on the right.     She has LBP and spasms in back and legs, and knee pain, helped by hydrocodone  (she takes 6 x 7.5 mg daily).  Gabapentin  (even 300 mg) causes sleepiness and was not tolerated.   She is compliant (PDMP has been checked).  She has not shown any drug-seeking  behavior.  Hhdrocodone has helped her pain the most.   When pain flares, she feels her walking does worse.     Sometimes has neck pain and occipital pain.    Thoracic pain has been worse recently.       Vit D 50000 units causes heart racing but lower dose daily is tolerated.       Vit B12 was also low in the past.        MS History:  In 2008, she had an MRI of the brain performed which was consistent with multiple sclerosis and she was diagnosed with relapsing remitting MS. He was initially placed on Avonex for therapy. She had some exacerbations with weakness and clumsiness with her gait and she started to see Dr. Juliane. He placed her on Tysabri she is on Tysabri between 2010 and 2013. 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    Imaging Review: 12/13/17 MRI of the cervical and thoracic spine:  She has multiple T2 hyperintense lesions in the spinal cord the largest to the right C2-C3 and smaller ones at C3-C4, C4, C5-C6, C6-C7 and T1-T2 all the foci were present compared to the 2016 MRI.   The MRI of the thoracic spine showed small foci adjacent to T2 and T5-6.  None of the spinal plaques appeared to be acute.     MRI 02/25/2020 brain:   Multiple T2/FLAIR hyperintense foci in the hemispheres in a pattern and configuration consistent with chronic demyelinating plaque associated with multiple sclerosis.  None of the foci appears to be acute.  Compared to the MRI from 03/18/2017, there is no interval change.    MRI 2016, 2017 and 2018 showed no new lesions   MRI of the brain 05/19/2022 showed no new lesions.   MRI of the thoracic spine 05/29/2022 showed no new lesions.   MRI of the lumbar spine 05/29/2022 showed At L3-L4, there is mild to moderate spinal stenosis due to disc protrusion and other degenerative change.  Additionally there is moderate right foraminal narrowing and moderate bilateral lateral recess stenosis.  There does not appear to be nerve root compression.   Milder degenerative changes at other lumbar levels not causing spinal stenosis or nerve root compression.   MRI brain 06/03/2023 showed T2/FLAIR hyperintense foci in the hemispheres and a couple foci in the cerebellum consistent with chronic demyelinating plaque associated with multiple sclerosis. None of the foci appear to be acute. They do not enhance. Compared to the MRI from 12/12/2022, there were no new lesions.   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    REVIEW OF SYSTEMS: Out of a complete 14 system review of symptoms, the patient complains only of the following symptoms,  chronic pain, fatigue, anxiety, insomnia and all other reviewed systems are negative.   ALLERGIES: Allergies  Allergen Reactions   Ibuprofen Other (See Comments)    CAUSES LYMPHEDEMA   Nsaids Other (See Comments)    AVOIDS ANY SODIUM-CONTAINING MED. -- CAUSES LYMPHEDEMA   Other Swelling and Other (See Comments)    Laxatives and artificial sweeteners- cause leg swelling   Vitamin D  Analogs Palpitations and Other (See Comments)    Cannot tolerate at 50,000 units OR even 2,000 units     HOME MEDICATIONS:  Outpatient Medications Prior to Visit  Medication Sig Dispense Refill   ALPRAZolam  (XANAX ) 0.25 MG tablet Take 1 tablet (0.25 mg total) by mouth at bedtime as needed for anxiety. (Patient not taking: Reported on 05/28/2024) 90 tablet 1   ciprofloxacin  (CIPRO ) 250 MG tablet Take 1 tablet (250 mg total) by mouth 2 (two) times daily. (Patient not taking: Reported on 05/28/2024) 14 tablet 0   cyanocobalamin  (VITAMIN B12) 1000 MCG/ML injection INJECT 1 ML TWICE PER MONTH 2 mL 11   HYDROcodone -acetaminophen  (NORCO) 7.5-325 MG tablet One po q4 hrs prn 150 tablet 0   hyoscyamine  (LEVSIN  SL) 0.125 MG SL tablet Place 1 tablet (0.125 mg total) under the tongue every 4 (four) hours as needed. 270 tablet 0   methocarbamol  (ROBAXIN ) 500 MG tablet Take 1 tablet (500 mg total) by mouth 3 (three) times daily. (Patient not taking: Reported on 05/28/2024) 90 tablet 5   methylphenidate  (RITALIN ) 20 MG tablet Take 1 tablet (20 mg total) by mouth 3 (three) times daily with meals. 90 tablet 0   methylphenidate  (RITALIN ) 20 MG tablet Take 1 tablet (20 mg total) by mouth 3 (three) times daily with meals. 90 tablet 0   metoprolol  tartrate (LOPRESSOR ) 50 MG tablet TAKE 1/2 TABLET BY MOUTH EVERY 12 HOURS AS NEEDED FOR PALPITATIONS 30 tablet 5   Syringe/Needle, Disp, (SYRINGE 3CC/23GX1) 23G X 1 3 ML MISC 3 Syringes by Does not apply route every 30 (thirty) days. 26Gx5/6 3 each 4   No facility-administered medications  prior to visit.     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   Headache(784.0)    History of concussion YOUNG ADULT--  NO RESIDUAL   IBS (irritable bowel syndrome)    Movement disorder    MS (multiple sclerosis)    MVP (mitral valve prolapse) HX HEART RACING  YRS AGO   ASYMPTOMATIC SINCE THEN   Vision abnormalities    Weakness of right leg SECONDARY TO MS     PAST SURGICAL HISTORY: Past Surgical History:  Procedure Laterality Date   CESAREAN SECTION  X3   BILATERAL TUBAL LIGATION W/ LAST ONE   DESTRUCTION OF ANAL CONDYLOMA  07-07-2005  DR TOTH   HERNIA REPAIR     KNEE ARTHROSCOPY WITH LATERAL MENISECTOMY  10/26/2012   Procedure: KNEE ARTHROSCOPY WITH LATERAL MENISECTOMY;  Surgeon: Reyes JAYSON Billing, MD;  Location: [facility];  Service: Orthopedics;  Laterality: Left;  Left Knee Arthrsocopy with Partial Lateral Menisectomy   LAPAROSCOPIC ASSISTED VAGINAL HYSTERECTOMY  01-15-2002  DR [name] HOLLAND/ DR TIM DAVIS   AND REPAIR VENTRAL HERNIA   LASER ABLATION CONDOLAMATA N/A 04/26/2013   Procedure: LASER ABLATION CONDOLAMATA;  Surgeon: Bernarda Ned, MD;  Location: [facility];  Service: General;  Laterality: N/A;   TONSILLECTOMY     TOTAL KNEE ARTHROPLASTY Left 11/05/2015   Procedure: LEFT TOTAL KNEE ARTHROPLASTY;  Surgeon: Reyes Billing, MD;  Location: [facility];  Service: Orthopedics;  Laterality: Left;     FAMILY HISTORY: Family History  Problem Relation Age of Onset   Diabetes Mother    Hypertension Mother    Stroke Mother  Bowel Disease Brother    Diabetes Brother    Healthy Brother    Emphysema Father    Heart attack Father      SOCIAL HISTORY: Social History   Socioeconomic History    Marital status: Widowed    Spouse name: Not on file   Number of children: 3   Years of education: Not on file   Highest education level: Not on file  Occupational History   Not on file  Tobacco Use   Smoking status: Never   Smokeless tobacco: Never  Substance and Sexual Activity   Alcohol use: No   Drug use: No   Sexual activity: Not on file  Other Topics Concern   Not on file  Social History Narrative   Not on file   Social Drivers of Health   Tobacco Use: Low Risk (05/28/2024)   Patient History    Smoking Tobacco Use: Never    Smokeless Tobacco Use: Never    Passive Exposure: Not on file  Financial Resource Strain: Medium Risk (03/14/2024)   Overall Financial Resource Strain (CARDIA)    Difficulty of Paying Living Expenses: Somewhat hard  Food Insecurity: No Food Insecurity (07/03/2023)   Hunger Vital Sign    Worried About Running Out of Food in the Last Year: Never true    Ran Out of Food in the Last Year: Never true  Transportation Needs: No Transportation Needs (07/03/2023)   PRAPARE - Administrator, Civil Service (Medical): No    Lack of Transportation (Non-Medical): No  Physical Activity: Inactive (07/03/2023)   Exercise Vital Sign    Days of Exercise per Week: 0 days    Minutes of Exercise per Session: 0 min  Stress: No Stress Concern Present (03/14/2024)   [employer] of Occupational Health - Occupational Stress Questionnaire    Feeling of Stress : Only a little  Social Connections: Moderately Integrated (03/14/2024)   Social Connection and Isolation Panel    Frequency of Communication with Friends and Family: More than three times a week    Frequency of Social Gatherings with Friends and Family: More than three times a week    Attends Religious Services: More than 4 times per year    Active Member of [employer] or Organizations: Yes    Attends Banker Meetings: More than 4 times per year    Marital Status: Widowed  Intimate Partner  Violence: Not At Risk (03/14/2024)   Humiliation, Afraid, Rape, and Kick questionnaire    Fear of Current or Ex-Partner: No    Emotionally Abused: No    Physically Abused: No    Sexually Abused: No  Depression (PHQ2-9): Low Risk (03/14/2024)   Depression (PHQ2-9)    PHQ-2 Score: 1  Alcohol Screen: Low Risk (07/03/2023)   Alcohol Screen    Last Alcohol Screening Score (AUDIT): 0  Housing: Low Risk (07/03/2023)   Housing    Last Housing Risk Score: 0  Utilities: Not At Risk (03/14/2024)   AHC Utilities    Threatened with loss of utilities: No  Health Literacy: Adequate Health Literacy (03/14/2024)   B1300 Health Literacy    Frequency of need for help with medical instructions: Never      PHYSICAL EXAM  There were no vitals filed for this visit.   No data found.   Orthostatics negative. Afebrile   There is no height or weight on file to calculate BMI.   Generalized: Well developed, in no acute distress  Cardiology: normal  rate and rhythm, no murmur auscultated  Respiratory: clear to auscultation bilaterally    Neurological examination  Mentation: Alert oriented to time, place, history taking. Follows all commands speech and language fluent Cranial nerve II-XII: Pupils were equal round reactive to light. Extraocular movements were full, visual field were full on confrontational test. Facial sensation and strength were normal. Head turning and shoulder shrug  were normal and symmetric. Motor: The motor testing reveals 5 over 5 strength of all 4 extremities. Tone increased in legs.  Sensory: Sensory testing is intact to soft touch on all 4 extremities. No evidence of extinction is noted.  Coordination: Cerebellar testing reveals good finger-nose-finger and heel-to-shin bilaterally.  Gait and station: Normal station, gait mildly wide, stable without assistive device.  Reflexes: Deep tendon reflexes are symmetric and brisk bilaterally.     DIAGNOSTIC DATA (LABS, IMAGING,  TESTING) - I reviewed patient records, labs, notes, testing and imaging myself where available.  Lab Results  Component Value Date   WBC 4.2 12/07/2022   HGB 15.2 12/07/2022   HCT 46.0 12/07/2022   MCV 90 12/07/2022   PLT 264 12/07/2022      Component Value Date/Time   NA 140 12/07/2022 1349   K 4.4 12/07/2022 1349   CL 100 12/07/2022 1349   CO2 26 12/07/2022 1349   GLUCOSE 94 12/07/2022 1349   GLUCOSE 88 08/17/2022 1234   BUN 14 12/07/2022 1349   CREATININE 0.95 12/07/2022 1349   CREATININE 0.84 07/08/2022 0930   CALCIUM 9.4 12/07/2022 1349   PROT 7.0 12/07/2022 1349   ALBUMIN 4.4 12/07/2022 1349   AST 26 12/07/2022 1349   ALT 26 12/07/2022 1349   ALKPHOS 109 12/07/2022 1349   BILITOT 0.6 12/07/2022 1349   GFRNONAA >60 08/17/2022 1234   GFRNONAA 86 09/15/2020 1120   GFRAA 100 09/15/2020 1120   Lab Results  Component Value Date   CHOL 184 07/08/2022   HDL 89 07/08/2022   LDLCALC 80 07/08/2022   TRIG 71 07/08/2022   CHOLHDL 2.1 07/08/2022   Lab Results  Component Value Date   HGBA1C  03/10/2010    5.5 (NOTE) The ADA recommends the following therapeutic goal for glycemic control related to Hgb A1c measurement: Goal of therapy: <6.5 Hgb A1c  Reference: American Diabetes Association: Clinical Practice Recommendations 2010, Diabetes Care, 2010, 33: (Suppl  1).   Lab Results  Component Value Date   VITAMINB12 529 10/23/2023   Lab Results  Component Value Date   TSH 1.63 12/09/2022        No data to display               No data to display           ASSESSMENT AND PLAN  63 y.o. year old female  has a past medical history of ADD (attention deficit disorder), Anxiety, Arthritis, Bladder incontinence, Bone spur, Chronic fatigue, Dysrhythmia, Generalized neuropathy, Headache(784.0), History of concussion (YOUNG ADULT--  NO RESIDUAL), IBS (irritable bowel syndrome), Movement disorder, MS (multiple sclerosis), MVP (mitral valve prolapse) (HX HEART RACING   YRS AGO), Vision abnormalities, and Weakness of right leg (SECONDARY TO MS). here with   Multiple sclerosis  Chronic prescription opiate use  Abnormality of gait  Chronic fatigue  Chronic midline low back pain without sciatica  Vitamin B 12 deficiency  Vitamin D  deficiency  [name] is doing fairly well, today. We will continue current treatment plan. PDMP reviewed and appropriate. We will update labs, today. She will keep an  eye on her BP. Continue close follow up with Dr Perri, PCP. She will continue healthy lifestyle habits. She will follow up in 4-6 months with Dr Vear.    No orders of the defined types were placed in this encounter.    No orders of the defined types were placed in this encounter.   Greig Forbes, MSN, FNP-C 11/18/2024, 10:54 AM  [facility] [address], [address] [place], [place] [address] [phone]

## 2024-11-19 ENCOUNTER — Encounter: Payer: Self-pay | Admitting: Family Medicine

## 2024-11-19 ENCOUNTER — Ambulatory Visit: Admitting: Family Medicine

## 2024-11-19 VITALS — BP 130/83 | HR 60 | Ht 65.0 in | Wt 146.5 lb

## 2024-11-19 DIAGNOSIS — G35D Multiple sclerosis, unspecified: Secondary | ICD-10-CM | POA: Diagnosis not present

## 2024-11-19 DIAGNOSIS — Z79891 Long term (current) use of opiate analgesic: Secondary | ICD-10-CM | POA: Diagnosis not present

## 2024-11-19 DIAGNOSIS — R5382 Chronic fatigue, unspecified: Secondary | ICD-10-CM

## 2024-11-19 DIAGNOSIS — E538 Deficiency of other specified B group vitamins: Secondary | ICD-10-CM

## 2024-11-19 DIAGNOSIS — R269 Unspecified abnormalities of gait and mobility: Secondary | ICD-10-CM

## 2024-11-19 DIAGNOSIS — E559 Vitamin D deficiency, unspecified: Secondary | ICD-10-CM

## 2024-11-19 DIAGNOSIS — G8929 Other chronic pain: Secondary | ICD-10-CM

## 2024-11-19 DIAGNOSIS — M545 Low back pain, unspecified: Secondary | ICD-10-CM | POA: Diagnosis not present

## 2024-11-25 ENCOUNTER — Other Ambulatory Visit: Payer: Self-pay | Admitting: Family Medicine

## 2024-11-25 ENCOUNTER — Other Ambulatory Visit (HOSPITAL_BASED_OUTPATIENT_CLINIC_OR_DEPARTMENT_OTHER): Payer: Self-pay

## 2024-11-25 MED ORDER — HYDROCODONE-ACETAMINOPHEN 7.5-325 MG PO TABS: ORAL_TABLET | ORAL | 0 refills | Status: DC

## 2024-11-25 MED ORDER — METHYLPHENIDATE HCL 20 MG PO TABS
20.0000 mg | ORAL_TABLET | Freq: Three times a day (TID) | ORAL | 0 refills | Status: DC
  Filled 2024-11-25 – 2024-11-27 (×2): qty 90, 30d supply, fill #0

## 2024-11-25 NOTE — Telephone Encounter (Signed)
 Dr.Camara you are work provider for the afternoon Last seen on 11/19/24 Follow up scheduled on 07/15/25   Dispensed Days Supply Quantity Provider Pharmacy  HYDROCODONE -ACETAMIN 7.5-325 10/29/2024 25 150 each [name], [name] LABOR, MD [phone]...      Dispensed Days Supply Quantity Provider Pharmacy  methylphenidate  (RITALIN ) 20 MG tablet 10/30/2024 30 90 tablet [name], [name] LABOR, MD [facility]      Dr. Gregg can you re-order the hydrocodone -acetaminophen  7.5-325 mg and send to [facility] [place]. I can't have both Rx's pending for 2 different pharmacy's.  I have the Ritalan pending already for correct pharmacy.

## 2024-11-25 NOTE — Telephone Encounter (Signed)
 Pt called stating she was calling these medications in early but she is wanting to make sure that they get called in before the holidays. Pt is needing a refill on her methylphenidate  (RITALIN ) 20 MG tablet and she is needing this sent to the [facility] on [address]  And she is needing the HYDROcodone -acetaminophen  (NORCO) 7.5-325 MG tablet [facility] [place].

## 2024-11-25 NOTE — Addendum Note (Signed)
 Addended by: DOUGLASS DELON CROME on: 11/25/2024 01:06 PM   Modules accepted: Orders

## 2024-11-25 NOTE — Telephone Encounter (Signed)
 Patient informed Rx's have been approved an sent.

## 2024-11-25 NOTE — Telephone Encounter (Signed)
 Dr.Camara   I have hydrocodone -acetaminophen  with fill dates listed below  Rx pending to be signed to [facility]

## 2024-11-26 ENCOUNTER — Other Ambulatory Visit (HOSPITAL_BASED_OUTPATIENT_CLINIC_OR_DEPARTMENT_OTHER): Payer: Self-pay

## 2024-11-27 ENCOUNTER — Other Ambulatory Visit (HOSPITAL_BASED_OUTPATIENT_CLINIC_OR_DEPARTMENT_OTHER): Payer: Self-pay

## 2024-12-02 ENCOUNTER — Other Ambulatory Visit (HOSPITAL_BASED_OUTPATIENT_CLINIC_OR_DEPARTMENT_OTHER): Payer: Self-pay

## 2024-12-02 NOTE — Telephone Encounter (Signed)
 Pt called to follow up on medication request  Pt stated  she was  waiting for a call to get the rest of medication   HYDROcodone -acetaminophen  (NORCO) 7.5-325 MG tablet   Pt stated she only receive some of medication  [id number] [place], [place] - [address] (Ph: [phone])

## 2024-12-02 NOTE — Telephone Encounter (Signed)
 Dr. Vear patient.

## 2024-12-02 NOTE — Telephone Encounter (Signed)
 Pt states re: her HYDROcodone -acetaminophen  (NORCO) 7.5-325 MG tablet 150 pills are always called in, not just 60.  Pt asking RN looks into and corrects the correct amount be called in.

## 2024-12-03 MED ORDER — HYDROCODONE-ACETAMINOPHEN 7.5-325 MG PO TABS: ORAL_TABLET | ORAL | 0 refills | Status: DC

## 2024-12-03 NOTE — Telephone Encounter (Signed)
 Dr. Vear pt called stating she never picked up the 60 tablets that Dr.Camara approved last week when you were out of the office.   She pays out of pocket for Rx and would like the full supply.   I have Rx pending to be signed

## 2024-12-03 NOTE — Telephone Encounter (Signed)
 Pt called in and stated that the prescription had the wrong amount of tablets so therefore she declined to pick it up. Pt is needing the Rx corrected and sent in so that she can pick up the right amount. Please advise.

## 2024-12-03 NOTE — Addendum Note (Signed)
 Addended by: DOUGLASS DELON CROME on: 12/03/2024 02:08 PM   Modules accepted: Orders

## 2024-12-10 ENCOUNTER — Ambulatory Visit
Admission: RE | Admit: 2024-12-10 | Discharge: 2024-12-10 | Disposition: A | Source: Ambulatory Visit | Attending: Neurology | Admitting: Neurology

## 2024-12-10 ENCOUNTER — Ambulatory Visit: Payer: Self-pay | Admitting: Neurology

## 2024-12-10 DIAGNOSIS — G35D Multiple sclerosis, unspecified: Secondary | ICD-10-CM

## 2024-12-10 MED ORDER — GADOPICLENOL 0.5 MMOL/ML IV SOLN
7.0000 mL | Freq: Once | INTRAVENOUS | Status: AC | PRN
  Administered 2024-12-10: 7 mL via INTRAVENOUS

## 2024-12-26 ENCOUNTER — Other Ambulatory Visit (HOSPITAL_BASED_OUTPATIENT_CLINIC_OR_DEPARTMENT_OTHER): Payer: Self-pay

## 2024-12-26 ENCOUNTER — Other Ambulatory Visit (HOSPITAL_COMMUNITY): Payer: Self-pay

## 2024-12-26 ENCOUNTER — Other Ambulatory Visit: Payer: Self-pay | Admitting: Neurology

## 2024-12-26 MED ORDER — METHYLPHENIDATE HCL 20 MG PO TABS
20.0000 mg | ORAL_TABLET | Freq: Three times a day (TID) | ORAL | 0 refills | Status: AC
  Filled 2024-12-26: qty 90, 30d supply, fill #0
  Filled 2024-12-30: qty 80, 26d supply, fill #0
  Filled 2024-12-30: qty 10, 4d supply, fill #0

## 2024-12-26 NOTE — Telephone Encounter (Signed)
 Last visit- 11/19/2024 Next visit: not scheduled

## 2024-12-26 NOTE — Telephone Encounter (Signed)
 Patient's refill was sent to Dr. Gregg since he was the last on call doctor. But refill for methylphenidate  (RITALIN ) 20 MG tablet should sent to Dr. Vear. Send to [facility]

## 2024-12-28 ENCOUNTER — Other Ambulatory Visit (HOSPITAL_BASED_OUTPATIENT_CLINIC_OR_DEPARTMENT_OTHER): Payer: Self-pay

## 2024-12-28 ENCOUNTER — Other Ambulatory Visit (HOSPITAL_COMMUNITY): Payer: Self-pay

## 2024-12-29 ENCOUNTER — Other Ambulatory Visit (HOSPITAL_BASED_OUTPATIENT_CLINIC_OR_DEPARTMENT_OTHER): Payer: Self-pay

## 2024-12-30 ENCOUNTER — Other Ambulatory Visit: Payer: Self-pay

## 2024-12-30 ENCOUNTER — Other Ambulatory Visit (HOSPITAL_BASED_OUTPATIENT_CLINIC_OR_DEPARTMENT_OTHER): Payer: Self-pay

## 2024-12-31 ENCOUNTER — Other Ambulatory Visit (HOSPITAL_BASED_OUTPATIENT_CLINIC_OR_DEPARTMENT_OTHER): Payer: Self-pay

## 2025-01-01 ENCOUNTER — Telehealth: Payer: Self-pay | Admitting: Family Medicine

## 2025-01-01 MED ORDER — HYDROCODONE-ACETAMINOPHEN 7.5-325 MG PO TABS: ORAL_TABLET | ORAL | 0 refills | Status: AC

## 2025-01-01 NOTE — Telephone Encounter (Signed)
 Pt Last Seen 11/19/24 Upcoming Appointment 07/15/25   Hydrocodone  Last Filled 12/04/24

## 2025-01-01 NOTE — Telephone Encounter (Signed)
 Pt called needing a refill on her HYDROcodone -acetaminophen  (NORCO) 7.5-325 MG tablet [facility] [place]

## 2025-01-03 ENCOUNTER — Other Ambulatory Visit (HOSPITAL_BASED_OUTPATIENT_CLINIC_OR_DEPARTMENT_OTHER): Payer: Self-pay

## 2025-01-05 ENCOUNTER — Other Ambulatory Visit: Payer: Self-pay

## 2025-07-15 ENCOUNTER — Ambulatory Visit: Admitting: Neurology
